# Patient Record
Sex: Female | Born: 1935 | Race: White | Hispanic: No | Marital: Single | Smoking: Never smoker
Health system: Southern US, Community
[De-identification: ages and names within clinical notes are randomized; demographics above are authoritative.]

## PROBLEM LIST (undated history)

## (undated) DIAGNOSIS — Z8489 Family history of other specified conditions: Secondary | ICD-10-CM

## (undated) DIAGNOSIS — E785 Hyperlipidemia, unspecified: Secondary | ICD-10-CM

## (undated) DIAGNOSIS — G459 Transient cerebral ischemic attack, unspecified: Secondary | ICD-10-CM

## (undated) DIAGNOSIS — H9190 Unspecified hearing loss, unspecified ear: Secondary | ICD-10-CM

## (undated) DIAGNOSIS — C4490 Unspecified malignant neoplasm of skin, unspecified: Secondary | ICD-10-CM

## (undated) DIAGNOSIS — I10 Essential (primary) hypertension: Secondary | ICD-10-CM

## (undated) DIAGNOSIS — H903 Sensorineural hearing loss, bilateral: Secondary | ICD-10-CM

## (undated) DIAGNOSIS — R350 Frequency of micturition: Secondary | ICD-10-CM

## (undated) DIAGNOSIS — J45909 Unspecified asthma, uncomplicated: Secondary | ICD-10-CM

## (undated) DIAGNOSIS — I4891 Unspecified atrial fibrillation: Secondary | ICD-10-CM

## (undated) DIAGNOSIS — H01009 Unspecified blepharitis unspecified eye, unspecified eyelid: Secondary | ICD-10-CM

## (undated) DIAGNOSIS — S83209A Unspecified tear of unspecified meniscus, current injury, unspecified knee, initial encounter: Secondary | ICD-10-CM

## (undated) DIAGNOSIS — I499 Cardiac arrhythmia, unspecified: Secondary | ICD-10-CM

## (undated) DIAGNOSIS — E039 Hypothyroidism, unspecified: Secondary | ICD-10-CM

## (undated) DIAGNOSIS — B029 Zoster without complications: Secondary | ICD-10-CM

## (undated) DIAGNOSIS — G5603 Carpal tunnel syndrome, bilateral upper limbs: Principal | ICD-10-CM

## (undated) DIAGNOSIS — E7800 Pure hypercholesterolemia, unspecified: Secondary | ICD-10-CM

## (undated) DIAGNOSIS — M810 Age-related osteoporosis without current pathological fracture: Secondary | ICD-10-CM

## (undated) DIAGNOSIS — H3530 Unspecified macular degeneration: Secondary | ICD-10-CM

## (undated) DIAGNOSIS — R251 Tremor, unspecified: Secondary | ICD-10-CM

## (undated) HISTORY — DX: Zoster without complications: B02.9

## (undated) HISTORY — DX: Unspecified macular degeneration: H35.30

## (undated) HISTORY — DX: Frequency of micturition: R35.0

## (undated) HISTORY — PX: EYE SURGERY: SHX253

## (undated) HISTORY — DX: Unspecified tear of unspecified meniscus, current injury, unspecified knee, initial encounter: S83.209A

## (undated) HISTORY — DX: Hypothyroidism, unspecified: E03.9

## (undated) HISTORY — DX: Sensorineural hearing loss, bilateral: H90.3

## (undated) HISTORY — DX: Unspecified blepharitis unspecified eye, unspecified eyelid: H01.009

## (undated) HISTORY — DX: Tremor, unspecified: R25.1

## (undated) HISTORY — PX: TONSILLECTOMY: SUR1361

## (undated) HISTORY — DX: Unspecified asthma, uncomplicated: J45.909

## (undated) HISTORY — DX: Carpal tunnel syndrome, bilateral upper limbs: G56.03

## (undated) HISTORY — PX: CHOLECYSTECTOMY: SHX55

## (undated) HISTORY — DX: Hyperlipidemia, unspecified: E78.5

## (undated) HISTORY — DX: Age-related osteoporosis without current pathological fracture: M81.0

## (undated) HISTORY — DX: Essential (primary) hypertension: I10

## (undated) HISTORY — DX: Cardiac arrhythmia, unspecified: I49.9

## (undated) HISTORY — DX: Unspecified malignant neoplasm of skin, unspecified: C44.90

## (undated) HISTORY — PX: ABDOMINAL HYSTERECTOMY: SHX81

---
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
## 1972-03-13 HISTORY — PX: CATARACT EXTRACTION: SUR2

## 1975-03-14 HISTORY — PX: HIATAL HERNIA REPAIR: SHX195

## 1975-03-14 HISTORY — PX: DILATION AND CURETTAGE OF UTERUS: SHX78

## 1975-03-14 HISTORY — PX: ABDOMINAL HYSTERECTOMY: SHX81

## 1977-03-13 HISTORY — PX: CATARACT EXTRACTION: SUR2

## 1979-03-14 HISTORY — PX: CHOLECYSTECTOMY: SHX55

## 1979-03-14 HISTORY — PX: GALLBLADDER SURGERY: SHX652

## 1991-03-14 HISTORY — PX: CYSTOCELE REPAIR: SHX163

## 1993-03-13 HISTORY — PX: COSMETIC SURGERY: SHX468

## 2002-03-13 DIAGNOSIS — B029 Zoster without complications: Secondary | ICD-10-CM

## 2002-03-13 HISTORY — DX: Zoster without complications: B02.9

## 2003-03-14 DIAGNOSIS — C4490 Unspecified malignant neoplasm of skin, unspecified: Secondary | ICD-10-CM

## 2003-03-14 HISTORY — PX: SKIN CANCER EXCISION: SHX779

## 2003-03-14 HISTORY — DX: Unspecified malignant neoplasm of skin, unspecified: C44.90

## 2004-03-13 DIAGNOSIS — S83209A Unspecified tear of unspecified meniscus, current injury, unspecified knee, initial encounter: Secondary | ICD-10-CM

## 2004-03-13 DIAGNOSIS — R251 Tremor, unspecified: Secondary | ICD-10-CM

## 2004-03-13 HISTORY — DX: Tremor, unspecified: R25.1

## 2004-03-13 HISTORY — DX: Unspecified tear of unspecified meniscus, current injury, unspecified knee, initial encounter: S83.209A

## 2005-03-13 DIAGNOSIS — H903 Sensorineural hearing loss, bilateral: Secondary | ICD-10-CM

## 2005-03-13 HISTORY — DX: Sensorineural hearing loss, bilateral: H90.3

## 2006-08-16 ENCOUNTER — Encounter: Admission: RE | Admit: 2006-08-16 | Discharge: 2006-08-16 | Payer: Self-pay | Admitting: Orthopedic Surgery

## 2007-01-04 ENCOUNTER — Encounter: Admission: RE | Admit: 2007-01-04 | Discharge: 2007-01-04 | Payer: Self-pay | Admitting: Obstetrics and Gynecology

## 2008-01-08 ENCOUNTER — Encounter: Admission: RE | Admit: 2008-01-08 | Discharge: 2008-01-08 | Payer: Self-pay | Admitting: Obstetrics and Gynecology

## 2009-01-08 ENCOUNTER — Encounter: Admission: RE | Admit: 2009-01-08 | Discharge: 2009-01-08 | Payer: Self-pay | Admitting: Obstetrics and Gynecology

## 2009-03-25 ENCOUNTER — Encounter: Admission: RE | Admit: 2009-03-25 | Discharge: 2009-03-25 | Payer: Self-pay | Admitting: Internal Medicine

## 2010-01-11 ENCOUNTER — Encounter: Admission: RE | Admit: 2010-01-11 | Discharge: 2010-01-11 | Payer: Self-pay | Admitting: Obstetrics and Gynecology

## 2010-03-13 DIAGNOSIS — H3530 Unspecified macular degeneration: Secondary | ICD-10-CM

## 2010-03-13 HISTORY — DX: Unspecified macular degeneration: H35.30

## 2010-05-12 ENCOUNTER — Encounter: Payer: Self-pay | Admitting: Cardiology

## 2010-05-31 ENCOUNTER — Ambulatory Visit (INDEPENDENT_AMBULATORY_CARE_PROVIDER_SITE_OTHER): Payer: Medicare Other | Admitting: Pulmonary Disease

## 2010-05-31 ENCOUNTER — Ambulatory Visit (INDEPENDENT_AMBULATORY_CARE_PROVIDER_SITE_OTHER)
Admission: RE | Admit: 2010-05-31 | Discharge: 2010-05-31 | Disposition: A | Discharge status: Home / Self Care | Payer: Medicare Other | Source: Ambulatory Visit | Attending: Pulmonary Disease | Admitting: Pulmonary Disease

## 2010-05-31 ENCOUNTER — Encounter: Payer: Self-pay | Admitting: Pulmonary Disease

## 2010-05-31 VITALS — BP 138/80 | HR 61 | Temp 97.7°F | Ht 61.0 in | Wt 148.8 lb

## 2010-05-31 DIAGNOSIS — J45909 Unspecified asthma, uncomplicated: Secondary | ICD-10-CM

## 2010-05-31 DIAGNOSIS — R0600 Dyspnea, unspecified: Secondary | ICD-10-CM | POA: Insufficient documentation

## 2010-05-31 DIAGNOSIS — R0609 Other forms of dyspnea: Secondary | ICD-10-CM

## 2010-05-31 NOTE — Assessment & Plan Note (Addendum)
The pt has worsening doe over the last year that may or may not be due to her asthma.  Will need to consider other etiologies such as concomitant cardiac disease depending upon what is found in workup.  Will check cxr today to r/o any obvious acute pathology.

## 2010-05-31 NOTE — Progress Notes (Signed)
Subjective:    Patient ID: Christina Lawson, female    DOB: 08/03/1923, 77 y.o.   MRN: [id number]  HPI The pt is an 77y/o female who I have been asked to see for evaluation of dyspnea.  She has longstanding asthma dating back to the 50's, but has really done well overall.  She was initially treated with allergy vaccine, but this was discontinued and she was started on pulmicort by Dr. Willa Rough.  She did well until Jan of this year, and began to notice increased doe.  I do not have records from Dr. Willa Rough, but apparently there was a decline in her airflows on spirometry, and there was concern this was responsible for her worsening sob.  Her pulmicort was changed to qvar, but she thinks she is doing worse on this.  She primarily has doe with moderate activities, but thinks she can walk at a slow pace for "miles".  She will get winded bringing groceries in from the car.  She states that cough has never been a big issue until she changed to qvar.  She has never smoked, and has not had a recent cxr.  She has never been on ICS/LABA combo.  She rarely uses rescue inhaler.  The pt has no known cardiac history, and has not ever had a cardiac eval to her knowledge.  She denies having any allergy issues.  She does have a h/o thyroidectomy, but tells me her TSH has been controlled.    Past Medical History  Diagnosis Date  . Unspecified essential hypertension   . Hyperlipidemia   . Skin cancer 2005    nose  . Allergic rhinitis   . Irregular heart rhythm   . Shingles 2004  . Delivery normal 1960  . Breech delivery 1962  . Macular degeneration 2012  . Tremor 2006    familiar.  dx by Dr. Hedy Camara  . Torn meniscus 2006    right knee  . Hearing loss 2007    Family History  Problem Relation Age of Onset  . Emphysema Father   . Allergies Father   . Heart disease Maternal Grandfather   . Brain cancer Mother   . Breast cancer Sister     also had bone marrow cancer  . Endometrial cancer Sister     History    Social History  . Marital Status: Single    Spouse Name: N/A    Number of Children: 2  . Years of Education: N/A   Occupational History  . social worker     retired   Social History Main Topics  . Smoking status: Never Smoker   . Smokeless tobacco: Not on file  . Alcohol Use: Yes     on occ.  . Drug Use: Not on file  . Sexually Active: Not on file   Other Topics Concern  . Not on file   Social History Narrative   Pt is widowed.    Allergies  Allergen Reactions  . Ibuprofen   . Penicillins     Outpatient Encounter Prescriptions as of 05/31/2010  Medication Sig Dispense Refill  . Aloe Vera GEL Apply topically as needed.        . ALPRAZolam (XANAX) 0.25 MG tablet Take 0.25 mg by mouth 2 (two) times daily as needed.        Marland Kitchen aspirin 81 MG tablet Take 81 mg by mouth daily.        Marland Kitchen atenolol (TENORMIN) 25 MG tablet Take 25 mg by mouth  2 (two) times daily.        . beclomethasone (QVAR) 80 MCG/ACT inhaler Inhale 2 puffs into the lungs 2 (two) times daily.        . Bepotastine Besilate (BEPREVE) 1.5 % SOLN Place 1 drop into both eyes 2 (two) times daily.        . calcium carbonate (OS-CAL) 600 MG TABS Take 600 mg by mouth 2 (two) times daily with a meal.        . Calcium Carbonate-Vit D-Min 600-400 MG-UNIT TABS Take 1 tablet by mouth 2 (two) times daily.        Marland Kitchen docusate sodium (COLACE) 100 MG capsule Take 200 mg by mouth daily as needed.        . fish oil-omega-3 fatty acids 1000 MG capsule Take 1 capsule by mouth daily.        Marland Kitchen loratadine (CLARITIN) 10 MG tablet Take 1 tab by mouth every other day       . minoxidil (ROGAINE) 2 % external solution Apply topically 2 (two) times daily.        . mometasone (NASONEX) 50 MCG/ACT nasal spray 1 spray by Nasal route daily.        . Multiple Vitamins-Minerals (CENTRUM PO) Take 1 tablet by mouth daily.        . Multiple Vitamins-Minerals (OCUVITE PO) Take 1 tablet by mouth daily.        . naproxen sodium (ANAPROX) 220 MG tablet Take  220 mg by mouth every 12 (twelve) hours as needed.        Marland Kitchen omeprazole (PRILOSEC) 20 MG capsule Take 20 mg by mouth daily.        . Saline (SIMPLY SALINE) 0.9 % AERS 1 spray by Nasal route 2 (two) times daily.        . simvastatin (ZOCOR) 20 MG tablet Take 20 mg by mouth at bedtime.        . traMADol (ULTRAM) 50 MG tablet Take 50 mg by mouth 3 (three) times daily as needed.             Review of Systems  Constitutional: Negative for fever and appetite change.  HENT: Negative for ear pain, congestion, sore throat, sneezing and trouble swallowing.   Respiratory: Positive for cough and shortness of breath.   Cardiovascular: Negative for chest pain.  Gastrointestinal: Negative for abdominal pain.  Psychiatric/Behavioral: The patient is nervous/anxious.        Objective:   Physical Exam  Constitutional: She is oriented to person, place, and time. She appears well-developed and well-nourished.  Eyes: EOM are normal. Pupils are equal, round, and reactive to light.  Neck: No JVD present. No thyromegaly present.  Cardiovascular: Normal rate, regular rhythm and intact distal pulses.  Exam reveals no friction rub.   Murmur (1/6 sem) heard. Pulmonary/Chest: Effort normal and breath sounds normal. No respiratory distress. She has no wheezes. She has no rales.  Abdominal: Soft. Bowel sounds are normal. There is no tenderness.  Musculoskeletal: She exhibits no edema.       No cyanosis   Lymphadenopathy:    She has no cervical adenopathy.  Neurological: She is alert and oriented to person, place, and time.       Moves all 4              Assessment & Plan:  Asthma, intrinsic The pt has a long history of asthma, and now is having worsening doe over the last year.  She  apparently has worsening flows on spirometry, but I will need to get these for review.  She may simply have fixed asthma that is acting more like copd due to longstanding airway remodeling.  We see this a lot in the elderly  asthma population who grew up in an age without ICS.  Would like to do full pfts for evaluation.  She is to stay on current therapy for now.  Dyspnea The pt has worsening doe over the last year that may or may not be due to her asthma.  Will need to consider other etiologies such as concomitant cardiac disease depending upon what is found in workup.  Will check cxr today to r/o any obvious acute pathology.

## 2010-05-31 NOTE — Patient Instructions (Signed)
Stay on qvar for now with rescue inhaler Will schedule for full breathing tests with followup visit same day Will check cxr today and call with results.

## 2010-05-31 NOTE — Assessment & Plan Note (Addendum)
The pt has a long history of asthma, and now is having worsening doe over the last year.  She apparently has worsening flows on spirometry, but I will need to get these for review.  She may simply have fixed asthma that is acting more like copd due to longstanding airway remodeling.  We see this a lot in the elderly asthma population who grew up in an age without ICS.  Would like to do full pfts for evaluation.  She is to stay on current therapy for now.

## 2010-06-02 ENCOUNTER — Telehealth: Payer: Self-pay | Admitting: Pulmonary Disease

## 2010-06-02 NOTE — Telephone Encounter (Signed)
Please advise of CXR results from 05-31-10. Thanks.

## 2010-06-02 NOTE — Telephone Encounter (Signed)
Let pt know that her cxr shows heart mildly enlarged, but no fluid on or in lungs Nothing on cxr to explain her worsening sob, though we may at some point have to consider cardiac evaluation.  Will see back when she has breathing tests.

## 2010-06-02 NOTE — Telephone Encounter (Signed)
LMOMTCB x 1 

## 2010-06-02 NOTE — Telephone Encounter (Signed)
Called and spoke with pt. Pt aware of cxr results.  Pt verbalized her understanding and denied any questions. Pt has pending appt for pft on 06/24/2010 and appt with kc following the pft.

## 2010-06-24 ENCOUNTER — Encounter: Payer: Self-pay | Admitting: Pulmonary Disease

## 2010-06-24 ENCOUNTER — Ambulatory Visit (INDEPENDENT_AMBULATORY_CARE_PROVIDER_SITE_OTHER): Payer: Medicare Other | Admitting: Pulmonary Disease

## 2010-06-24 DIAGNOSIS — R0989 Other specified symptoms and signs involving the circulatory and respiratory systems: Secondary | ICD-10-CM

## 2010-06-24 DIAGNOSIS — R0609 Other forms of dyspnea: Secondary | ICD-10-CM

## 2010-06-24 DIAGNOSIS — J45909 Unspecified asthma, uncomplicated: Secondary | ICD-10-CM

## 2010-06-24 DIAGNOSIS — R0600 Dyspnea, unspecified: Secondary | ICD-10-CM

## 2010-06-24 LAB — PULMONARY FUNCTION TEST

## 2010-06-24 NOTE — Assessment & Plan Note (Signed)
The pt's pfts are really unimpressive, and I suspect that her asthma is not the reason for her doe.  Will try more aggressive treatment of her asthma, but if no change, she will need cardiac evaluation.

## 2010-06-24 NOTE — Assessment & Plan Note (Signed)
The pt's asthma appears well controlled by symptoms, and her spirometry is totally normal.  She does have mild airtrapping on lung volumes though.  I am willing to give her a trial of more aggressive treatment for her asthma with LABA/ICS, but I really do not think her asthma is responsible for her dyspnea.

## 2010-06-24 NOTE — Progress Notes (Signed)
  Subjective:    Patient ID: Christina Lawson, female    DOB: September 09, 1923, 77 y.o.   MRN: [id number]  HPI The pt comes in today for review of her pfts.  They show no airflow obstruction, no restriction, and minimal decrease in DLCO that corrects with Av.  She did have mild airtrapping with increased RV.  I have reviewed the study with her in detail, and answered all of her questions.  Of note, her cxr last visit showed CM, but clear lung fields.    Review of Systems  Constitutional: Negative for fever and unexpected weight change.  HENT: Positive for congestion, sneezing and dental problem. Negative for ear pain, nosebleeds, sore throat, rhinorrhea, trouble swallowing, postnasal drip and sinus pressure.   Eyes: Positive for redness and itching.  Respiratory: Positive for cough, chest tightness and shortness of breath. Negative for wheezing.   Cardiovascular: Negative for palpitations and leg swelling.  Gastrointestinal: Negative for nausea and vomiting.  Genitourinary: Negative for dysuria.  Musculoskeletal: Negative for joint swelling.  Skin: Negative for rash.  Neurological: Negative for headaches.  Hematological: Bruises/bleeds easily.  Psychiatric/Behavioral: Negative for dysphoric mood. The patient is not nervous/anxious.        Objective:   Physical Exam Ow female in nad Chest clear to auscultation Cor with rrr LE with minimal edema, no cyanosis  Alert and oriented, moves all 4        Assessment & Plan:

## 2010-06-24 NOTE — Patient Instructions (Signed)
Stop qvar for now.  Trial of dulera 100/5  2 inhalations am and pm.  Rinse mouth well.  Please call me in 3 weeks to let me know if breathing has significantly improved.  If it has not, would go back to either pulmicort or qvar for maintenance, and would recommend cardiac workup.

## 2010-06-24 NOTE — Progress Notes (Signed)
PFT done today. 

## 2010-06-29 ENCOUNTER — Encounter: Payer: Self-pay | Admitting: Pulmonary Disease

## 2010-07-18 ENCOUNTER — Telehealth: Payer: Self-pay | Admitting: Pulmonary Disease

## 2010-07-18 ENCOUNTER — Other Ambulatory Visit: Payer: Self-pay | Admitting: Pulmonary Disease

## 2010-07-18 DIAGNOSIS — R0600 Dyspnea, unspecified: Secondary | ICD-10-CM

## 2010-07-18 NOTE — Telephone Encounter (Signed)
Ok to stop dulera, and to get back on qvar Ok to send copy of pfts to her md's I will send an order to [facility] for cardiology apptm

## 2010-07-18 NOTE — Telephone Encounter (Signed)
Pt says her breathing has not improved since on Dulera and says the cough is actually worse. She would also like to know if [facility] would make referral for cardiology. Pls send copies of PFT to Dr. Murray Hodgkins and Dr. Reather Converse. Pls advise.

## 2010-07-19 NOTE — Telephone Encounter (Signed)
Pt aware to stop dulera and go back to qvar. Pt aware order was sent to [facility] for cardiology appt. Pt aware pft's will be faxed to Dr. Willa Rough and Dr. Chilton Si

## 2010-07-28 ENCOUNTER — Encounter: Payer: Self-pay | Admitting: Pulmonary Disease

## 2010-08-10 ENCOUNTER — Encounter: Payer: Self-pay | Admitting: Cardiology

## 2010-08-10 ENCOUNTER — Ambulatory Visit (INDEPENDENT_AMBULATORY_CARE_PROVIDER_SITE_OTHER): Payer: Medicare Other | Admitting: Cardiology

## 2010-08-10 VITALS — BP 148/82 | HR 61 | Ht 60.0 in | Wt 145.0 lb

## 2010-08-10 DIAGNOSIS — I119 Hypertensive heart disease without heart failure: Secondary | ICD-10-CM | POA: Insufficient documentation

## 2010-08-10 DIAGNOSIS — I517 Cardiomegaly: Secondary | ICD-10-CM

## 2010-08-10 NOTE — Patient Instructions (Signed)
1. Your physician recommends that you schedule a follow-up appointment as needed with Dr. Daleen Squibb 2. Your physician has requested that you have an echocardiogram. Echocardiography is a painless test that uses sound waves to create images of your heart. It provides your doctor with information about the size and shape of your heart and how well your heart's chambers and valves are working. This procedure takes approximately one hour. There are no restrictions for this procedure.   Appointment is: June 7,2012 AT 11:30 am

## 2010-08-10 NOTE — Progress Notes (Signed)
Christina Lawson is referred today by Dr. Shelle Iron for cardiomegaly on chest x-ray.  She has had hypertension for a number of years. She has no previous valvular heart disease or coronary disease.  Just mild dyspnea on exertion is her only symptom. She is not short of breath with routine activity. She denies any orthopnea, PND, palpitations, edema.  Past Medical History  Diagnosis Date  . Unspecified essential hypertension   . Hyperlipidemia   . Skin cancer 2005    nose  . Allergic rhinitis   . Irregular heart rhythm   . Shingles 2004  . Delivery normal 1960  . Breech delivery 1962  . Macular degeneration 2012  . Tremor 2006    familiar.  dx by Dr. Hedy Camara  . Torn meniscus 2006    right knee  . Hearing loss 2007    Past Surgical History  Procedure Date  . Tonsillectomy 1931  . Thyroidectomy 1955  . Cataract extraction 1974    Left  . Abdominal hysterectomy 1977  . Dilation and curettage of uterus 1977  . Hiatal hernia repair 1977  . Cataract extraction 1979    right  . Gallbladder surgery 1981  . Cosmetic surgery 1995    eye  . Skin cancer excision 2005    nose    Family History  Problem Relation Age of Onset  . Emphysema Father   . Allergies Father   . Heart disease Maternal Grandfather   . Brain cancer Mother   . Breast cancer Sister     also had bone marrow cancer  . Endometrial cancer Sister   . Stroke      History   Social History  . Marital Status: Single    Spouse Name: N/A    Number of Children: 2  . Years of Education: N/A   Occupational History  . social worker     retired   Social History Main Topics  . Smoking status: Never Smoker   . Smokeless tobacco: Not on file  . Alcohol Use: Yes     on occ.  . Drug Use: No  . Sexually Active: Not on file   Other Topics Concern  . Not on file   Social History Narrative   Pt is widowed.    Allergies  Allergen Reactions  . Ibuprofen   . Penicillins     . Current Outpatient  Prescriptions  Medication Sig Dispense Refill  . Aloe Vera GEL Apply topically as needed.        . ALPRAZolam (XANAX) 0.25 MG tablet Take 0.25 mg by mouth at bedtime as needed.       Marland Kitchen aspirin 81 MG tablet Take 81 mg by mouth daily.        Marland Kitchen atenolol (TENORMIN) 25 MG tablet Take 25 mg by mouth 2 (two) times daily.        . beclomethasone (QVAR) 80 MCG/ACT inhaler Inhale 2 puffs into the lungs 2 (two) times daily.        . Bepotastine Besilate (BEPREVE) 1.5 % SOLN Place 1 drop into both eyes 2 (two) times daily.        . Biotin 1000 MCG tablet Take 1,000 mcg by mouth daily.        . Calcium Carbonate-Vit D-Min 600-400 MG-UNIT TABS Take 1 tablet by mouth 2 (two) times daily.        Marland Kitchen docusate sodium (COLACE) 100 MG capsule Take 200 mg by mouth daily as needed.        Marland Kitchen  fish oil-omega-3 fatty acids 1000 MG capsule Take 1 capsule by mouth daily.        Marland Kitchen loratadine (CLARITIN) 10 MG tablet Take 10 mg by mouth daily.       . minoxidil (ROGAINE) 2 % external solution Apply topically 2 (two) times daily.        . mometasone (NASONEX) 50 MCG/ACT nasal spray 1 spray by Nasal route daily.        . Multiple Vitamins-Minerals (CENTRUM PO) Take 1 tablet by mouth daily.        . Multiple Vitamins-Minerals (OCUVITE PO) Take 1 tablet by mouth daily.        . naproxen sodium (ANAPROX) 220 MG tablet Take 220 mg by mouth every 12 (twelve) hours as needed.        Marland Kitchen omeprazole (PRILOSEC) 20 MG capsule Take 20 mg by mouth daily.        . Saline (SIMPLY SALINE) 0.9 % AERS 1 spray by Nasal route 2 (two) times daily.        . simvastatin (ZOCOR) 20 MG tablet Take 20 mg by mouth at bedtime.        . traMADol (ULTRAM) 50 MG tablet Take 50 mg by mouth 3 (three) times daily as needed.        Marland Kitchen DISCONTD: calcium carbonate (OS-CAL) 600 MG TABS Take 600 mg by mouth 2 (two) times daily with a meal.          ROS Negative other than HPI.  General Appearance: well developed, well nourished in no acute distress HEENT:  symmetrical face, PERRLA, good dentition  Neck: no JVD, thyromegaly, or adenopathy, trachea midline Chest: symmetric without deformity Cardiac: PMI non-displaced, RRR, normal S1, S2, soft systolic murmur LSB , no diastolic murmur Lung: clear to ausculation and percussion Vascular: all pulses full without bruits  Abdominal: nondistended, nontender, good bowel sounds, no HSM, no bruits Extremities: no cyanosis, clubbing or edema, no sign of DVT, no varicosities  Skin: normal color, no rashes Neuro: alert and oriented x 3, non-focal Pysch: normal affect  Filed Vitals:   08/10/10 0911  BP: 148/82  Pulse: 61  Height: 5' (1.524 m)  Weight: 145 lb (65.772 kg)    EKG  Labs and Studies Reviewed.   No results found for this basename: WBC, HGB, HCT, MCV, PLT      Chemistry   No results found for this basename: NA, K, CL, CO2, BUN, CREATININE, GLU   No results found for this basename: CALCIUM, ALKPHOS, AST, ALT, BILITOT       No results found for this basename: CHOL   No results found for this basename: HDL   No results found for this basename: LDLCALC   No results found for this basename: TRIG   No results found for this basename: CHOLHDL   No results found for this basename: HGBA1C   No results found for this basename: ALT, AST, GGT, ALKPHOS, BILITOT   No results found for this basename: TSH

## 2010-08-18 ENCOUNTER — Ambulatory Visit (HOSPITAL_COMMUNITY): Payer: Medicare Other | Attending: Cardiovascular Disease

## 2010-08-18 DIAGNOSIS — R011 Cardiac murmur, unspecified: Secondary | ICD-10-CM

## 2010-08-18 DIAGNOSIS — I059 Rheumatic mitral valve disease, unspecified: Secondary | ICD-10-CM | POA: Insufficient documentation

## 2010-08-18 DIAGNOSIS — I079 Rheumatic tricuspid valve disease, unspecified: Secondary | ICD-10-CM | POA: Insufficient documentation

## 2010-08-18 DIAGNOSIS — E785 Hyperlipidemia, unspecified: Secondary | ICD-10-CM | POA: Insufficient documentation

## 2010-08-18 DIAGNOSIS — I379 Nonrheumatic pulmonary valve disorder, unspecified: Secondary | ICD-10-CM | POA: Insufficient documentation

## 2010-08-18 DIAGNOSIS — I517 Cardiomegaly: Secondary | ICD-10-CM

## 2010-08-18 DIAGNOSIS — I10 Essential (primary) hypertension: Secondary | ICD-10-CM | POA: Insufficient documentation

## 2010-08-22 ENCOUNTER — Telehealth: Payer: Self-pay | Admitting: Cardiology

## 2010-08-22 NOTE — Telephone Encounter (Signed)
Returning call from friday

## 2010-08-23 NOTE — Telephone Encounter (Signed)
Pt rtn call from debbie from Friday, pt called back yesterday and the message was left for debbie but she's not here this week-pt anxious about call

## 2010-08-23 NOTE — Telephone Encounter (Signed)
Echo results and recommendations given to pt. A copy of echo mailed to pt. as requested.

## 2010-09-06 ENCOUNTER — Telehealth: Payer: Self-pay | Admitting: Cardiology

## 2010-09-06 NOTE — Telephone Encounter (Signed)
Faxed Echo. To [place] @ [facility] ([phone]).

## 2010-09-08 ENCOUNTER — Telehealth: Payer: Self-pay | Admitting: Cardiology

## 2010-09-08 NOTE — Telephone Encounter (Signed)
ROI Faxed to [facility] @ [phone] 09/08/10/km

## 2010-09-23 ENCOUNTER — Telehealth: Payer: Self-pay | Admitting: *Deleted

## 2010-09-23 NOTE — Telephone Encounter (Signed)
Message copied by Theda Belfast on Fri Sep 23, 2010  9:09 AM ------      Message from: Valera [place] C      Created: Thu Sep 22, 2010  5:30 PM       Normal echocardiogram and had no evidence of an enlarged heart.

## 2010-09-23 NOTE — Telephone Encounter (Signed)
Pt aware of echo results Debbie Tyon Cerasoli RN  

## 2010-12-05 ENCOUNTER — Other Ambulatory Visit: Payer: Self-pay | Admitting: Internal Medicine

## 2010-12-05 DIAGNOSIS — Z1231 Encounter for screening mammogram for malignant neoplasm of breast: Secondary | ICD-10-CM

## 2010-12-14 ENCOUNTER — Other Ambulatory Visit: Payer: Self-pay | Admitting: Internal Medicine

## 2010-12-14 DIAGNOSIS — M8580 Other specified disorders of bone density and structure, unspecified site: Secondary | ICD-10-CM

## 2011-01-18 ENCOUNTER — Ambulatory Visit
Admission: RE | Admit: 2011-01-18 | Discharge: 2011-01-18 | Disposition: A | Discharge status: Home / Self Care | Payer: Medicare Other | Source: Ambulatory Visit | Attending: Internal Medicine | Admitting: Internal Medicine

## 2011-01-18 DIAGNOSIS — Z1231 Encounter for screening mammogram for malignant neoplasm of breast: Secondary | ICD-10-CM

## 2011-01-18 DIAGNOSIS — M8580 Other specified disorders of bone density and structure, unspecified site: Secondary | ICD-10-CM

## 2011-03-23 DIAGNOSIS — J45909 Unspecified asthma, uncomplicated: Secondary | ICD-10-CM | POA: Diagnosis not present

## 2011-03-23 DIAGNOSIS — J309 Allergic rhinitis, unspecified: Secondary | ICD-10-CM | POA: Diagnosis not present

## 2011-04-12 DIAGNOSIS — M25519 Pain in unspecified shoulder: Secondary | ICD-10-CM | POA: Diagnosis not present

## 2011-04-12 DIAGNOSIS — IMO0001 Reserved for inherently not codable concepts without codable children: Secondary | ICD-10-CM | POA: Diagnosis not present

## 2011-04-20 DIAGNOSIS — J45909 Unspecified asthma, uncomplicated: Secondary | ICD-10-CM | POA: Diagnosis not present

## 2011-04-20 DIAGNOSIS — J309 Allergic rhinitis, unspecified: Secondary | ICD-10-CM | POA: Diagnosis not present

## 2011-04-27 DIAGNOSIS — H3530 Unspecified macular degeneration: Secondary | ICD-10-CM | POA: Diagnosis not present

## 2011-04-27 DIAGNOSIS — Z961 Presence of intraocular lens: Secondary | ICD-10-CM | POA: Diagnosis not present

## 2011-04-27 DIAGNOSIS — H1045 Other chronic allergic conjunctivitis: Secondary | ICD-10-CM | POA: Diagnosis not present

## 2011-05-11 DIAGNOSIS — G252 Other specified forms of tremor: Secondary | ICD-10-CM | POA: Diagnosis not present

## 2011-05-11 DIAGNOSIS — M810 Age-related osteoporosis without current pathological fracture: Secondary | ICD-10-CM | POA: Diagnosis not present

## 2011-05-11 DIAGNOSIS — R609 Edema, unspecified: Secondary | ICD-10-CM | POA: Diagnosis not present

## 2011-05-11 DIAGNOSIS — E039 Hypothyroidism, unspecified: Secondary | ICD-10-CM | POA: Diagnosis not present

## 2011-05-11 DIAGNOSIS — R7989 Other specified abnormal findings of blood chemistry: Secondary | ICD-10-CM | POA: Diagnosis not present

## 2011-05-22 DIAGNOSIS — G250 Essential tremor: Secondary | ICD-10-CM | POA: Diagnosis not present

## 2011-05-22 DIAGNOSIS — J45909 Unspecified asthma, uncomplicated: Secondary | ICD-10-CM | POA: Diagnosis not present

## 2011-05-22 DIAGNOSIS — R609 Edema, unspecified: Secondary | ICD-10-CM | POA: Diagnosis not present

## 2011-05-22 DIAGNOSIS — E039 Hypothyroidism, unspecified: Secondary | ICD-10-CM | POA: Diagnosis not present

## 2011-05-22 DIAGNOSIS — R7989 Other specified abnormal findings of blood chemistry: Secondary | ICD-10-CM | POA: Diagnosis not present

## 2011-05-22 DIAGNOSIS — E785 Hyperlipidemia, unspecified: Secondary | ICD-10-CM | POA: Diagnosis not present

## 2011-05-25 DIAGNOSIS — H6120 Impacted cerumen, unspecified ear: Secondary | ICD-10-CM | POA: Diagnosis not present

## 2011-05-25 DIAGNOSIS — H903 Sensorineural hearing loss, bilateral: Secondary | ICD-10-CM | POA: Diagnosis not present

## 2011-05-25 DIAGNOSIS — H6980 Other specified disorders of Eustachian tube, unspecified ear: Secondary | ICD-10-CM | POA: Diagnosis not present

## 2011-05-25 DIAGNOSIS — H908 Mixed conductive and sensorineural hearing loss, unspecified: Secondary | ICD-10-CM | POA: Diagnosis not present

## 2011-05-31 DIAGNOSIS — K648 Other hemorrhoids: Secondary | ICD-10-CM | POA: Diagnosis not present

## 2011-06-19 DIAGNOSIS — H903 Sensorineural hearing loss, bilateral: Secondary | ICD-10-CM | POA: Diagnosis not present

## 2011-06-19 DIAGNOSIS — H908 Mixed conductive and sensorineural hearing loss, unspecified: Secondary | ICD-10-CM | POA: Diagnosis not present

## 2011-06-19 DIAGNOSIS — H6980 Other specified disorders of Eustachian tube, unspecified ear: Secondary | ICD-10-CM | POA: Diagnosis not present

## 2011-06-27 DIAGNOSIS — H903 Sensorineural hearing loss, bilateral: Secondary | ICD-10-CM | POA: Diagnosis not present

## 2011-06-27 DIAGNOSIS — H908 Mixed conductive and sensorineural hearing loss, unspecified: Secondary | ICD-10-CM | POA: Diagnosis not present

## 2011-06-27 DIAGNOSIS — H6980 Other specified disorders of Eustachian tube, unspecified ear: Secondary | ICD-10-CM | POA: Diagnosis not present

## 2011-07-04 DIAGNOSIS — L408 Other psoriasis: Secondary | ICD-10-CM | POA: Diagnosis not present

## 2011-07-04 DIAGNOSIS — D239 Other benign neoplasm of skin, unspecified: Secondary | ICD-10-CM | POA: Diagnosis not present

## 2011-07-04 DIAGNOSIS — L819 Disorder of pigmentation, unspecified: Secondary | ICD-10-CM | POA: Diagnosis not present

## 2011-07-04 DIAGNOSIS — L821 Other seborrheic keratosis: Secondary | ICD-10-CM | POA: Diagnosis not present

## 2011-07-04 DIAGNOSIS — Z85828 Personal history of other malignant neoplasm of skin: Secondary | ICD-10-CM | POA: Diagnosis not present

## 2011-07-04 DIAGNOSIS — L659 Nonscarring hair loss, unspecified: Secondary | ICD-10-CM | POA: Diagnosis not present

## 2011-07-04 DIAGNOSIS — D1801 Hemangioma of skin and subcutaneous tissue: Secondary | ICD-10-CM | POA: Diagnosis not present

## 2011-10-03 DIAGNOSIS — L820 Inflamed seborrheic keratosis: Secondary | ICD-10-CM | POA: Diagnosis not present

## 2011-10-03 DIAGNOSIS — L821 Other seborrheic keratosis: Secondary | ICD-10-CM | POA: Diagnosis not present

## 2011-10-25 DIAGNOSIS — J45909 Unspecified asthma, uncomplicated: Secondary | ICD-10-CM | POA: Diagnosis not present

## 2011-10-25 DIAGNOSIS — J309 Allergic rhinitis, unspecified: Secondary | ICD-10-CM | POA: Diagnosis not present

## 2011-11-09 DIAGNOSIS — Z961 Presence of intraocular lens: Secondary | ICD-10-CM | POA: Diagnosis not present

## 2011-11-09 DIAGNOSIS — H3530 Unspecified macular degeneration: Secondary | ICD-10-CM | POA: Diagnosis not present

## 2011-11-15 ENCOUNTER — Other Ambulatory Visit: Payer: Self-pay | Admitting: Geriatric Medicine

## 2011-11-15 ENCOUNTER — Ambulatory Visit
Admission: RE | Admit: 2011-11-15 | Discharge: 2011-11-15 | Disposition: A | Discharge status: Home / Self Care | Payer: Medicare Other | Source: Ambulatory Visit | Attending: Geriatric Medicine | Admitting: Geriatric Medicine

## 2011-11-15 DIAGNOSIS — M79609 Pain in unspecified limb: Secondary | ICD-10-CM | POA: Diagnosis not present

## 2011-11-15 DIAGNOSIS — M79673 Pain in unspecified foot: Secondary | ICD-10-CM

## 2011-11-15 DIAGNOSIS — M25579 Pain in unspecified ankle and joints of unspecified foot: Secondary | ICD-10-CM | POA: Diagnosis not present

## 2011-12-05 DIAGNOSIS — E785 Hyperlipidemia, unspecified: Secondary | ICD-10-CM | POA: Diagnosis not present

## 2011-12-05 DIAGNOSIS — E039 Hypothyroidism, unspecified: Secondary | ICD-10-CM | POA: Diagnosis not present

## 2011-12-05 DIAGNOSIS — Z79899 Other long term (current) drug therapy: Secondary | ICD-10-CM | POA: Diagnosis not present

## 2011-12-05 DIAGNOSIS — R7989 Other specified abnormal findings of blood chemistry: Secondary | ICD-10-CM | POA: Diagnosis not present

## 2011-12-18 ENCOUNTER — Other Ambulatory Visit: Payer: Self-pay | Admitting: Internal Medicine

## 2011-12-18 DIAGNOSIS — J45909 Unspecified asthma, uncomplicated: Secondary | ICD-10-CM | POA: Diagnosis not present

## 2011-12-18 DIAGNOSIS — M47817 Spondylosis without myelopathy or radiculopathy, lumbosacral region: Secondary | ICD-10-CM | POA: Diagnosis not present

## 2011-12-18 DIAGNOSIS — E785 Hyperlipidemia, unspecified: Secondary | ICD-10-CM | POA: Diagnosis not present

## 2011-12-18 DIAGNOSIS — Z1231 Encounter for screening mammogram for malignant neoplasm of breast: Secondary | ICD-10-CM

## 2011-12-18 DIAGNOSIS — G250 Essential tremor: Secondary | ICD-10-CM | POA: Diagnosis not present

## 2011-12-26 DIAGNOSIS — Z23 Encounter for immunization: Secondary | ICD-10-CM | POA: Diagnosis not present

## 2012-01-09 DIAGNOSIS — H903 Sensorineural hearing loss, bilateral: Secondary | ICD-10-CM | POA: Diagnosis not present

## 2012-01-09 DIAGNOSIS — H6120 Impacted cerumen, unspecified ear: Secondary | ICD-10-CM | POA: Diagnosis not present

## 2012-01-23 ENCOUNTER — Ambulatory Visit
Admission: RE | Admit: 2012-01-23 | Discharge: 2012-01-23 | Disposition: A | Discharge status: Home / Self Care | Payer: Medicare Other | Source: Ambulatory Visit | Attending: Internal Medicine | Admitting: Internal Medicine

## 2012-01-23 DIAGNOSIS — Z1231 Encounter for screening mammogram for malignant neoplasm of breast: Secondary | ICD-10-CM

## 2012-02-23 DIAGNOSIS — G250 Essential tremor: Secondary | ICD-10-CM | POA: Diagnosis not present

## 2012-02-23 DIAGNOSIS — G252 Other specified forms of tremor: Secondary | ICD-10-CM | POA: Diagnosis not present

## 2012-04-30 DIAGNOSIS — Z961 Presence of intraocular lens: Secondary | ICD-10-CM | POA: Diagnosis not present

## 2012-04-30 DIAGNOSIS — H3530 Unspecified macular degeneration: Secondary | ICD-10-CM | POA: Diagnosis not present

## 2012-04-30 DIAGNOSIS — H1045 Other chronic allergic conjunctivitis: Secondary | ICD-10-CM | POA: Diagnosis not present

## 2012-04-30 DIAGNOSIS — H04129 Dry eye syndrome of unspecified lacrimal gland: Secondary | ICD-10-CM | POA: Diagnosis not present

## 2012-05-01 DIAGNOSIS — J309 Allergic rhinitis, unspecified: Secondary | ICD-10-CM | POA: Diagnosis not present

## 2012-05-01 DIAGNOSIS — J45909 Unspecified asthma, uncomplicated: Secondary | ICD-10-CM | POA: Diagnosis not present

## 2012-05-23 DIAGNOSIS — H353190 Nonexudative age-related macular degeneration, unspecified eye, stage unspecified: Secondary | ICD-10-CM | POA: Insufficient documentation

## 2012-05-23 DIAGNOSIS — Z961 Presence of intraocular lens: Secondary | ICD-10-CM | POA: Diagnosis not present

## 2012-06-13 DIAGNOSIS — Z79899 Other long term (current) drug therapy: Secondary | ICD-10-CM | POA: Diagnosis not present

## 2012-06-13 DIAGNOSIS — E785 Hyperlipidemia, unspecified: Secondary | ICD-10-CM | POA: Diagnosis not present

## 2012-06-13 DIAGNOSIS — E039 Hypothyroidism, unspecified: Secondary | ICD-10-CM | POA: Diagnosis not present

## 2012-06-13 DIAGNOSIS — R7989 Other specified abnormal findings of blood chemistry: Secondary | ICD-10-CM | POA: Diagnosis not present

## 2012-06-17 ENCOUNTER — Encounter: Payer: Self-pay | Admitting: Internal Medicine

## 2012-06-17 ENCOUNTER — Non-Acute Institutional Stay: Payer: Medicare Other | Admitting: Internal Medicine

## 2012-06-17 DIAGNOSIS — I10 Essential (primary) hypertension: Secondary | ICD-10-CM | POA: Diagnosis not present

## 2012-06-17 DIAGNOSIS — E119 Type 2 diabetes mellitus without complications: Secondary | ICD-10-CM

## 2012-06-17 DIAGNOSIS — R251 Tremor, unspecified: Secondary | ICD-10-CM

## 2012-06-17 DIAGNOSIS — R259 Unspecified abnormal involuntary movements: Secondary | ICD-10-CM | POA: Diagnosis not present

## 2012-06-17 DIAGNOSIS — M25579 Pain in unspecified ankle and joints of unspecified foot: Secondary | ICD-10-CM

## 2012-06-17 DIAGNOSIS — R350 Frequency of micturition: Secondary | ICD-10-CM

## 2012-06-17 DIAGNOSIS — E785 Hyperlipidemia, unspecified: Secondary | ICD-10-CM | POA: Diagnosis not present

## 2012-06-17 DIAGNOSIS — M25572 Pain in left ankle and joints of left foot: Secondary | ICD-10-CM

## 2012-06-17 DIAGNOSIS — E039 Hypothyroidism, unspecified: Secondary | ICD-10-CM

## 2012-06-17 DIAGNOSIS — G250 Essential tremor: Secondary | ICD-10-CM | POA: Insufficient documentation

## 2012-06-17 DIAGNOSIS — H6120 Impacted cerumen, unspecified ear: Secondary | ICD-10-CM

## 2012-07-04 DIAGNOSIS — Z85828 Personal history of other malignant neoplasm of skin: Secondary | ICD-10-CM | POA: Diagnosis not present

## 2012-07-04 DIAGNOSIS — L538 Other specified erythematous conditions: Secondary | ICD-10-CM | POA: Diagnosis not present

## 2012-07-04 DIAGNOSIS — L408 Other psoriasis: Secondary | ICD-10-CM | POA: Diagnosis not present

## 2012-07-04 DIAGNOSIS — D692 Other nonthrombocytopenic purpura: Secondary | ICD-10-CM | POA: Diagnosis not present

## 2012-07-04 DIAGNOSIS — L820 Inflamed seborrheic keratosis: Secondary | ICD-10-CM | POA: Diagnosis not present

## 2012-07-08 DIAGNOSIS — H6120 Impacted cerumen, unspecified ear: Secondary | ICD-10-CM | POA: Diagnosis not present

## 2012-07-08 DIAGNOSIS — H903 Sensorineural hearing loss, bilateral: Secondary | ICD-10-CM | POA: Diagnosis not present

## 2012-07-25 ENCOUNTER — Encounter: Payer: Self-pay | Admitting: Internal Medicine

## 2012-09-23 DIAGNOSIS — H6120 Impacted cerumen, unspecified ear: Secondary | ICD-10-CM | POA: Insufficient documentation

## 2012-09-23 DIAGNOSIS — E119 Type 2 diabetes mellitus without complications: Secondary | ICD-10-CM | POA: Insufficient documentation

## 2012-09-23 DIAGNOSIS — R350 Frequency of micturition: Secondary | ICD-10-CM | POA: Insufficient documentation

## 2012-09-23 MED ORDER — TRAMADOL HCL 50 MG PO TABS: 50.0000 mg | ORAL_TABLET | Freq: Three times a day (TID) | ORAL | Status: DC | PRN

## 2012-09-23 MED ORDER — OXYBUTYNIN 3.9 MG/24HR TD PTTW: 1.0000 | MEDICATED_PATCH | TRANSDERMAL | Status: DC

## 2012-09-23 NOTE — Patient Instructions (Signed)
Continue medications as prescribed. Add oxybutynin for urinary frequency.

## 2012-09-23 NOTE — Progress Notes (Signed)
Subjective:    Patient ID: Christina Lawson, female    DOB: February 13, 1924, 79 y.o.   MRN: [id number]  HPI  6 month follow up.  Heel pains are better Exercising: goes to pool 3 days weekly Naps more during the day. SOB with exertion, like walking up a hill.  Unspecified essential hypertension: controlled  Hyperlipidemia: recheck next visit  Tremor; unchanged  Urinary frequency: would like to try oxybutynin patch  Impacted cerumen, unspecified laterality: no pain  Unspecified hypothyroidism: controlled on supplements  Type II or unspecified type diabetes mellitus without mention of complication, not stated as uncontrolled: controlled  Current Outpatient Prescriptions on File Prior to Visit  Medication Sig Dispense Refill  . Aloe Vera GEL Apply topically as needed.        . ALPRAZolam (XANAX) 0.25 MG tablet Take 0.25 mg by mouth at bedtime as needed.       Marland Kitchen aspirin 81 MG tablet Take 81 mg by mouth daily.        Marland Kitchen atenolol (TENORMIN) 25 MG tablet Take 25 mg by mouth 2 (two) times daily.        . beclomethasone (QVAR) 80 MCG/ACT inhaler Inhale 2 puffs into the lungs 2 (two) times daily.        . Bepotastine Besilate (BEPREVE) 1.5 % SOLN Place 1 drop into both eyes 2 (two) times daily.        . Biotin 1000 MCG tablet Take 1,000 mcg by mouth daily.        . Calcium Carbonate-Vit D-Min 600-400 MG-UNIT TABS Take 1 tablet by mouth 2 (two) times daily.        Marland Kitchen docusate sodium (COLACE) 100 MG capsule Take 200 mg by mouth daily as needed.        . fish oil-omega-3 fatty acids 1000 MG capsule Take 1 capsule by mouth daily.        Marland Kitchen loratadine (CLARITIN) 10 MG tablet Take 10 mg by mouth daily.       . minoxidil (ROGAINE) 2 % external solution Apply topically 2 (two) times daily.        . mometasone (NASONEX) 50 MCG/ACT nasal spray 1 spray by Nasal route daily.        . Multiple Vitamins-Minerals (CENTRUM PO) Take 1 tablet by mouth daily.        . Multiple Vitamins-Minerals (OCUVITE PO)  Take 1 tablet by mouth daily.        . naproxen sodium (ANAPROX) 220 MG tablet Take 220 mg by mouth every 12 (twelve) hours as needed.        Marland Kitchen omeprazole (PRILOSEC) 20 MG capsule Take 20 mg by mouth daily.        . Saline (SIMPLY SALINE) 0.9 % AERS 1 spray by Nasal route 2 (two) times daily.        . simvastatin (ZOCOR) 20 MG tablet Take 20 mg by mouth at bedtime.        . traMADol (ULTRAM) 50 MG tablet Take 50 mg by mouth 3 (three) times daily as needed.             Review of Systems  Constitutional: Positive for fatigue. Negative for fever, activity change, appetite change and unexpected weight change.  HENT: Negative.   Eyes: Negative.   Respiratory: Positive for shortness of breath.   Cardiovascular: Negative for chest pain and leg swelling.  Gastrointestinal: Positive for constipation. Negative for abdominal pain, diarrhea and abdominal distention.  Endocrine:  Diabetic, diet controlled.  Genitourinary: Positive for frequency.  Musculoskeletal: Negative for back pain, joint swelling, arthralgias and gait problem.  Skin: Negative.   Neurological: Negative for dizziness, tremors, seizures, speech difficulty, weakness and numbness.  Hematological: Negative.   Psychiatric/Behavioral: Negative.        Objective:   Physical Exam  Constitutional: She is oriented to person, place, and time. She appears well-developed and well-nourished. No distress.  HENT:  Bilateral cerumen occlusion.  Eyes: Conjunctivae are normal. Pupils are equal, round, and reactive to light.  Neck: Normal range of motion. Neck supple. No JVD present. No tracheal deviation present. No thyromegaly present.  Cardiovascular: Normal rate, regular rhythm, normal heart sounds and intact distal pulses.  Exam reveals no gallop.   No murmur heard. Pulmonary/Chest: Effort normal and breath sounds normal. No respiratory distress. She has no rales. She exhibits no tenderness.  Abdominal: Soft. Bowel sounds are  normal. She exhibits no distension and no mass. There is no tenderness.  Musculoskeletal: Normal range of motion. She exhibits no edema and no tenderness.  Lymphadenopathy:    She has no cervical adenopathy.  Neurological: She is alert and oriented to person, place, and time. A cranial nerve deficit is present. Coordination normal.  Skin: No rash noted. No erythema. No pallor.  Psychiatric: She has a normal mood and affect. Her behavior is normal. Judgment and thought content normal.      Lab reports. 06/13/12 BMP: glu 94, BUN 19, CREAT. 0.85  Lipids: TC 209, trig 109, HDL 445, LDL 142  TSH 4.799  A1c 6.6    Assessment & Plan:  Unspecified essential hypertension: continue current med.  Hyperlipidemia: continue simvastatin  Tremor: no change in meds  Urinary frequency: add oxybutynin patch 3.9.mg twice weekly  Impacted cerumen, unspecified laterality: removed during this visit  Unspecified hypothyroidism: stable  Type II or unspecified type diabetes mellitus without mention of complication, not stated as uncontrolled: continue to monitor

## 2012-09-24 ENCOUNTER — Other Ambulatory Visit: Payer: Self-pay | Admitting: Geriatric Medicine

## 2012-10-16 DIAGNOSIS — J45909 Unspecified asthma, uncomplicated: Secondary | ICD-10-CM | POA: Diagnosis not present

## 2012-10-16 DIAGNOSIS — J309 Allergic rhinitis, unspecified: Secondary | ICD-10-CM | POA: Diagnosis not present

## 2012-11-28 DIAGNOSIS — J45909 Unspecified asthma, uncomplicated: Secondary | ICD-10-CM | POA: Diagnosis not present

## 2012-11-28 DIAGNOSIS — J309 Allergic rhinitis, unspecified: Secondary | ICD-10-CM | POA: Diagnosis not present

## 2012-12-10 DIAGNOSIS — R350 Frequency of micturition: Secondary | ICD-10-CM | POA: Diagnosis not present

## 2012-12-10 DIAGNOSIS — H6120 Impacted cerumen, unspecified ear: Secondary | ICD-10-CM | POA: Diagnosis not present

## 2012-12-10 DIAGNOSIS — E785 Hyperlipidemia, unspecified: Secondary | ICD-10-CM | POA: Diagnosis not present

## 2012-12-10 DIAGNOSIS — G250 Essential tremor: Secondary | ICD-10-CM | POA: Diagnosis not present

## 2012-12-10 DIAGNOSIS — E039 Hypothyroidism, unspecified: Secondary | ICD-10-CM | POA: Diagnosis not present

## 2012-12-10 DIAGNOSIS — E119 Type 2 diabetes mellitus without complications: Secondary | ICD-10-CM | POA: Diagnosis not present

## 2012-12-11 ENCOUNTER — Encounter: Payer: Self-pay | Admitting: *Deleted

## 2012-12-11 ENCOUNTER — Other Ambulatory Visit: Payer: Self-pay | Admitting: Internal Medicine

## 2012-12-16 ENCOUNTER — Encounter: Payer: Self-pay | Admitting: Internal Medicine

## 2012-12-16 ENCOUNTER — Non-Acute Institutional Stay: Payer: Medicare Other | Admitting: Internal Medicine

## 2012-12-16 VITALS — BP 120/66 | HR 68 | Ht 60.0 in | Wt 146.0 lb

## 2012-12-16 DIAGNOSIS — E785 Hyperlipidemia, unspecified: Secondary | ICD-10-CM | POA: Diagnosis not present

## 2012-12-16 DIAGNOSIS — M899 Disorder of bone, unspecified: Secondary | ICD-10-CM | POA: Diagnosis not present

## 2012-12-16 DIAGNOSIS — I10 Essential (primary) hypertension: Secondary | ICD-10-CM | POA: Diagnosis not present

## 2012-12-16 DIAGNOSIS — E039 Hypothyroidism, unspecified: Secondary | ICD-10-CM

## 2012-12-16 DIAGNOSIS — M8580 Other specified disorders of bone density and structure, unspecified site: Secondary | ICD-10-CM

## 2012-12-16 DIAGNOSIS — I517 Cardiomegaly: Secondary | ICD-10-CM

## 2012-12-16 NOTE — Patient Instructions (Signed)
Continue current medications. 

## 2012-12-16 NOTE — Progress Notes (Signed)
Subjective:    Patient ID: Christina Lawson, female    DOB: 1923-11-08, 79 y.o.   MRN: [id number]  HPI Had some asthma in Sept 2014. Saw her allergist, Dr. Willa Rough. She was put on prednisone. She is better.   Continues with hoarseness. Laryngitis comes and goes.  Prior cardiologist was Dr. Daleen Squibb , who has left [facility]. She was seen for an incidental finding of cardiomegaly on X-ray.  Complains of left knee pain. Haas seen Dr. Darrelyn Hillock several years ago and received asteroid injection. Has done pool exercises as well. She does not want another injection.  Wants to know if she needs another Bone Density.  Left eye is followed by Dr. Dione Booze. Next visit is in Feb. 2014. She thinks something is wrong. Has a continual discharge. Denies pain.  No longer using oxybutynin patch.  Tremor is unchanged. To see Dr. Anne Hahn in Nov. Using alprazolam when she needs it.  Continues simvastatin for hyperlipidemia.    Current Outpatient Prescriptions on File Prior to Visit  Medication Sig Dispense Refill  . Aloe Vera GEL Apply topically as needed.        . ALPRAZolam (XANAX) 0.25 MG tablet Take 0.25 mg by mouth at bedtime as needed.       Marland Kitchen aspirin 81 MG tablet Take 81 mg by mouth daily.        Marland Kitchen atenolol (TENORMIN) 25 MG tablet Take 25 mg by mouth 2 (two) times daily.        . Biotin 1000 MCG tablet Take 1,000 mcg by mouth daily.        . Calcium Carbonate-Vit D-Min 600-400 MG-UNIT TABS Take 1 tablet by mouth 2 (two) times daily.        Marland Kitchen docusate sodium (COLACE) 100 MG capsule Take 200 mg by mouth daily as needed.        . loratadine (CLARITIN) 10 MG tablet Take 10 mg by mouth daily.       . minoxidil (ROGAINE) 2 % external solution Apply topically 2 (two) times daily.        . mometasone (NASONEX) 50 MCG/ACT nasal spray 1 spray by Nasal route daily.        . Multiple Vitamins-Minerals (CENTRUM PO) Take 1 tablet by mouth daily.        . naproxen sodium (ANAPROX) 220 MG tablet Take 220 mg by  mouth every 12 (twelve) hours as needed.        Marland Kitchen omeprazole (PRILOSEC) 20 MG capsule Take 20 mg by mouth daily.        . Saline (SIMPLY SALINE) 0.9 % AERS 1 spray by Nasal route 2 (two) times daily.        . simvastatin (ZOCOR) 20 MG tablet TAKE 1 TABLET DAILY TO LOWER CHOLESTEROL  90 tablet  3  . traMADol (ULTRAM) 50 MG tablet Take 1 tablet (50 mg total) by mouth 3 (three) times daily as needed.  30 tablet  3   No current facility-administered medications on file prior to visit.    Review of Systems  Constitutional: Positive for fatigue. Negative for fever, activity change, appetite change and unexpected weight change.  HENT: Negative.   Eyes:       Left eye started feeling bad a couple of weeks ago. Has had a flush in the white of her eyes.  Respiratory: Positive for shortness of breath.   Cardiovascular: Negative for chest pain and leg swelling.  Gastrointestinal: Positive for constipation. Negative for abdominal pain, diarrhea  and abdominal distention.  Endocrine:       Diabetic, diet controlled.  Genitourinary: Positive for frequency.  Musculoskeletal: Negative for back pain, joint swelling, arthralgias and gait problem.  Skin: Negative.   Neurological: Negative for dizziness, tremors, seizures, speech difficulty, weakness and numbness.  Hematological: Negative.   Psychiatric/Behavioral: Negative.        Objective:BP 120/66  Pulse 68  Ht 5' (1.524 m)  Wt 146 lb (66.225 kg)  BMI 28.51 kg/m2    Physical Exam  Constitutional: She is oriented to person, place, and time. She appears well-developed and well-nourished. No distress.  HENT:  Bilateral cerumen occlusion.  Eyes: Conjunctivae are normal. Pupils are equal, round, and reactive to light.  Flushed appearance of the cornea of both eyes, but worse on the left.  Neck: Normal range of motion. Neck supple. No JVD present. No tracheal deviation present. No thyromegaly present.  Cardiovascular: Normal rate, regular rhythm,  normal heart sounds and intact distal pulses.  Exam reveals no gallop.   No murmur heard. Pulmonary/Chest: Effort normal and breath sounds normal. No respiratory distress. She has no rales. She exhibits no tenderness.  Abdominal: Soft. Bowel sounds are normal. She exhibits no distension and no mass. There is no tenderness.  Musculoskeletal: Normal range of motion. She exhibits no edema and no tenderness.  Lymphadenopathy:    She has no cervical adenopathy.  Neurological: She is alert and oriented to person, place, and time. A cranial nerve deficit is present. Coordination normal.  Skin: No rash noted. No erythema. No pallor.  Psychiatric: She has a normal mood and affect. Her behavior is normal. Judgment and thought content normal.     LABS REVIEWED 12/10/12 CMP: nl  Lipids: TC 215, trig 236,HDL 44, LDL 124  A1c 6.3       Assessment & Plan:  Unspecified essential hypertension; stable  Unspecified hypothyroidism: stable  Hyperlipidemia: mild elevation in LDL. Continue simvastatin.  Osteopenia: I do not think at her age therree is need to follow the Bone Density  Cardiomegaly: Follow with CXR next year. I have not referred to a different cardiologist.

## 2012-12-17 ENCOUNTER — Other Ambulatory Visit: Payer: Self-pay

## 2012-12-17 DIAGNOSIS — Z1231 Encounter for screening mammogram for malignant neoplasm of breast: Secondary | ICD-10-CM

## 2012-12-19 DIAGNOSIS — H01029 Squamous blepharitis unspecified eye, unspecified eyelid: Secondary | ICD-10-CM | POA: Diagnosis not present

## 2012-12-19 DIAGNOSIS — H0230 Blepharochalasis unspecified eye, unspecified eyelid: Secondary | ICD-10-CM | POA: Diagnosis not present

## 2012-12-19 DIAGNOSIS — H04129 Dry eye syndrome of unspecified lacrimal gland: Secondary | ICD-10-CM | POA: Diagnosis not present

## 2012-12-19 DIAGNOSIS — H04529 Eversion of unspecified lacrimal punctum: Secondary | ICD-10-CM | POA: Diagnosis not present

## 2012-12-31 DIAGNOSIS — Z23 Encounter for immunization: Secondary | ICD-10-CM | POA: Diagnosis not present

## 2013-01-02 ENCOUNTER — Encounter: Payer: Self-pay | Admitting: Internal Medicine

## 2013-01-06 ENCOUNTER — Non-Acute Institutional Stay: Payer: Medicare Other | Admitting: Internal Medicine

## 2013-01-06 ENCOUNTER — Encounter: Payer: Self-pay | Admitting: Internal Medicine

## 2013-01-06 VITALS — BP 144/72 | HR 60 | Ht 60.0 in | Wt 144.0 lb

## 2013-01-06 DIAGNOSIS — M25569 Pain in unspecified knee: Secondary | ICD-10-CM | POA: Diagnosis not present

## 2013-01-06 DIAGNOSIS — M25562 Pain in left knee: Secondary | ICD-10-CM | POA: Insufficient documentation

## 2013-01-06 DIAGNOSIS — I10 Essential (primary) hypertension: Secondary | ICD-10-CM

## 2013-01-06 MED ORDER — DICLOFENAC SODIUM 3 % TD GEL: TRANSDERMAL | Status: DC

## 2013-01-06 NOTE — Progress Notes (Signed)
Subjective:    Patient ID: Christina Lawson, female    DOB: September 21, 1923, 79 y.o.   MRN: [id number]  HPI .  Pain in left knee: Left knee pain. Seemed worse in the last week. Hurts in the pool. Pain comes and goes. No swelling. Does not wear compression dressing. Tramadol. She tries not to take. Sometimes uses ice or warm compresses. Pain is worst at thee lateral collateral ligament.  Unspecified essential hypertension: controlled   Current Outpatient Prescriptions on File Prior to Visit  Medication Sig Dispense Refill  . Aloe Vera GEL Apply topically as needed.        . ALPRAZolam (XANAX) 0.25 MG tablet Take 0.25 mg by mouth at bedtime as needed.       Marland Kitchen aspirin 81 MG tablet Take 81 mg by mouth daily.        Marland Kitchen atenolol (TENORMIN) 25 MG tablet Take 25 mg by mouth 2 (two) times daily.        . Biotin 1000 MCG tablet Take 1,000 mcg by mouth daily.        Marland Kitchen docusate sodium (COLACE) 100 MG capsule Take 200 mg by mouth daily as needed.        . loratadine (CLARITIN) 10 MG tablet Take 10 mg by mouth daily.       . minoxidil (ROGAINE) 2 % external solution Apply topically 2 (two) times daily.        . mometasone (NASONEX) 50 MCG/ACT nasal spray 1 spray by Nasal route daily.        . naproxen sodium (ANAPROX) 220 MG tablet Take 220 mg by mouth every 12 (twelve) hours as needed.        Marland Kitchen omeprazole (PRILOSEC) 20 MG capsule Take 20 mg by mouth daily.        . Saline (SIMPLY SALINE) 0.9 % AERS 1 spray by Nasal route 2 (two) times daily.        . simvastatin (ZOCOR) 20 MG tablet TAKE 1 TABLET DAILY TO LOWER CHOLESTEROL  90 tablet  3  . traMADol (ULTRAM) 50 MG tablet Take 1 tablet (50 mg total) by mouth 3 (three) times daily as needed.  30 tablet  3   No current facility-administered medications on file prior to visit.     Review of Systems  Constitutional: Positive for fatigue. Negative for fever, activity change, appetite change and unexpected weight change.  HENT: Negative.   Eyes:   Left eye started feeling bad a couple of weeks ago. Has had a flush in the white of her eyes.  Respiratory: Positive for shortness of breath.   Cardiovascular: Negative for chest pain and leg swelling.  Gastrointestinal: Positive for constipation. Negative for abdominal pain, diarrhea and abdominal distention.  Endocrine:       Diabetic, diet controlled.  Genitourinary: Positive for frequency.  Musculoskeletal: Positive for arthralgias (Left knee). Negative for back pain, gait problem and joint swelling.  Skin: Negative.   Neurological: Negative for dizziness, tremors, seizures, speech difficulty, weakness and numbness.  Hematological: Negative.   Psychiatric/Behavioral: Negative.        Objective:BP 144/72  Pulse 60  Ht 5' (1.524 m)  Wt 144 lb (65.318 kg)  BMI 28.12 kg/m2    Physical Exam  Constitutional: She is oriented to person, place, and time. She appears well-developed and well-nourished. No distress.  HENT:  Bilateral cerumen occlusion.  Eyes: Conjunctivae are normal. Pupils are equal, round, and reactive to light.  Flushed appearance of the cornea of  both eyes, but worse on the left.  Neck: Normal range of motion. Neck supple. No JVD present. No tracheal deviation present. No thyromegaly present.  Cardiovascular: Normal rate, regular rhythm, normal heart sounds and intact distal pulses.  Exam reveals no gallop.   No murmur heard. Pulmonary/Chest: Effort normal and breath sounds normal. No respiratory distress. She has no rales. She exhibits no tenderness.  Abdominal: Soft. Bowel sounds are normal. She exhibits no distension and no mass. There is no tenderness.  Musculoskeletal: Normal range of motion. She exhibits tenderness (Maximum tenderness at lateral collateral ligament. No joint effusion.). She exhibits no edema.  Lymphadenopathy:    She has no cervical adenopathy.  Neurological: She is alert and oriented to person, place, and time. A cranial nerve deficit is  present. Coordination normal.  Skin: No rash noted. No erythema. No pallor.  Psychiatric: She has a normal mood and affect. Her behavior is normal. Judgment and thought content normal.          Assessment & Plan:  Pain in left knee: The patient's pain is related to discomfort along the lateral collateral ligament. I told her I did not think an injection was a good idea.   - Plan: Diclofenac Sodium 3 % GEL  Unspecified essential hypertension: Controlled

## 2013-01-06 NOTE — Patient Instructions (Addendum)
Try the gel for arthritis 2-3 times daily. Call if relief is not satisfactory. Use regularly.

## 2013-01-13 DIAGNOSIS — H905 Unspecified sensorineural hearing loss: Secondary | ICD-10-CM | POA: Diagnosis not present

## 2013-01-13 DIAGNOSIS — H6120 Impacted cerumen, unspecified ear: Secondary | ICD-10-CM | POA: Diagnosis not present

## 2013-01-13 DIAGNOSIS — H6980 Other specified disorders of Eustachian tube, unspecified ear: Secondary | ICD-10-CM | POA: Diagnosis not present

## 2013-01-23 ENCOUNTER — Ambulatory Visit
Admission: RE | Admit: 2013-01-23 | Discharge: 2013-01-23 | Disposition: A | Discharge status: Home / Self Care | Payer: Medicare Other | Source: Ambulatory Visit

## 2013-01-23 DIAGNOSIS — Z1231 Encounter for screening mammogram for malignant neoplasm of breast: Secondary | ICD-10-CM

## 2013-01-31 ENCOUNTER — Other Ambulatory Visit: Payer: Self-pay | Admitting: Internal Medicine

## 2013-02-21 ENCOUNTER — Encounter: Payer: Self-pay | Admitting: Neurology

## 2013-02-21 ENCOUNTER — Ambulatory Visit (INDEPENDENT_AMBULATORY_CARE_PROVIDER_SITE_OTHER): Payer: Medicare Other | Admitting: Neurology

## 2013-02-21 VITALS — BP 128/74 | HR 60 | Wt 145.0 lb

## 2013-02-21 DIAGNOSIS — R259 Unspecified abnormal involuntary movements: Secondary | ICD-10-CM

## 2013-02-21 DIAGNOSIS — R251 Tremor, unspecified: Secondary | ICD-10-CM

## 2013-02-21 NOTE — Progress Notes (Signed)
Reason for visit: Essential tremor  Christina Lawson is an 79 y.o. female  History of present illness:  Christina Lawson is an 79 year old right-handed white female with a history of an essential tremor affecting mainly the upper extremities. The patient has done very well over the last one year, using alprazolam if needed for the tremor. The patient otherwise has not used any medications for the tremor. The patient indicates that the essential tremor does not impact her ability to perform activities of daily living. The patient has some sloppy handwriting, but her handwriting is legible. The patient is not having excessive problems with feeding herself, or using her hands otherwise. The patient has some mild gait instability, no falls have been noted. The patient notes that the tremors worse if she is upset or excited about something. The patient returns for an evaluation.  Past Medical History  Diagnosis Date  . Unspecified essential hypertension   . Hyperlipidemia   . Skin cancer 2005    nose  . Irregular heart rhythm   . Shingles 2004  . Delivery normal 1960  . Breech delivery 1962  . Macular degeneration 2012  . Tremor 2006    familiar.  dx by Dr. Hedy Camara  . Torn meniscus 2006    right knee  . Sensorineural hearing loss, bilateral 2007  . Asthma   . Osteoporosis   . Allergic rhinitis     pollen and grass  . Urinary frequency   . Unspecified hypothyroidism   . Blepharitis   . Movement disorder     Tremor    Past Surgical History  Procedure Laterality Date  . Tonsillectomy  1931  . Thyroidectomy  1955  . Cataract extraction  1974    Left, IOL implants  . Abdominal hysterectomy  1977  . Dilation and curettage of uterus  1977  . Hiatal hernia repair  1977  . Cataract extraction Right 1979    IOL implants  . Gallbladder surgery  1981  . Cosmetic surgery  1995    eye  . Skin cancer excision  2005    nose  . Cholecystectomy  1981  . Cystocele repair  1993     Family History  Problem Relation Age of Onset  . Emphysema Father   . Allergies Father   . Asthma Father   . Heart disease Maternal Grandfather   . Brain cancer Mother   . Cancer Mother     Brain  . Hypertension Mother   . Cancer Sister     Cervical  . Endometrial cancer Sister   . Cancer Sister     Breast, metastasized to bone  . Stroke      Social history:  reports that she has never smoked. She has never used smokeless tobacco. She reports that she drinks alcohol. She reports that she does not use illicit drugs.    Allergies  Allergen Reactions  . Clindamycin/Lincomycin     diarrhea  . Ibuprofen   . Penicillins   . Seasonal Ic [Cholestatin]     Medications:  Current Outpatient Prescriptions on File Prior to Visit  Medication Sig Dispense Refill  . Aloe Vera GEL Apply topically as needed.        . ALPRAZolam (XANAX) 0.25 MG tablet Take 0.25 mg by mouth at bedtime as needed.       Marland Kitchen aspirin 81 MG tablet Take 81 mg by mouth daily.        Marland Kitchen atenolol (TENORMIN) 25 MG tablet  TAKE 1 TABLET TWICE A DAY TO REDUCE TREMOR  180 tablet  0  . Biotin 1000 MCG tablet Take 1,000 mcg by mouth daily.        . Diclofenac Sodium 3 % GEL Apply 2 to 3 times daily to painful area of the left knee  200 g  3  . docusate sodium (COLACE) 100 MG capsule Take 200 mg by mouth daily as needed.        . loratadine (CLARITIN) 10 MG tablet Take 10 mg by mouth daily.       . minoxidil (ROGAINE) 2 % external solution Apply topically 2 (two) times daily.        . mometasone (NASONEX) 50 MCG/ACT nasal spray 1 spray by Nasal route daily.        . naproxen sodium (ANAPROX) 220 MG tablet Take 220 mg by mouth every 12 (twelve) hours as needed.        Marland Kitchen omeprazole (PRILOSEC) 20 MG capsule Take 20 mg by mouth daily.        . Saline (SIMPLY SALINE) 0.9 % AERS 1 spray by Nasal route 2 (two) times daily.        . simvastatin (ZOCOR) 20 MG tablet TAKE 1 TABLET DAILY TO LOWER CHOLESTEROL  90 tablet  3  .  traMADol (ULTRAM) 50 MG tablet Take 1 tablet (50 mg total) by mouth 3 (three) times daily as needed.  30 tablet  3   No current facility-administered medications on file prior to visit.    ROS:  Out of a complete 14 system review of symptoms, the patient complains only of the following symptoms, and all other reviewed systems are negative.  Eye discharge, redness Cough, wheezing, shortness of breath Environmental allergies Frequency of urination Bruising easily Tremors Anxiety Frequent waking and daytime drowsiness  Blood pressure 128/74, pulse 60, weight 145 lb (65.772 kg).  Physical Exam  General: The patient is alert and cooperative at the time of the examination.  Skin: No significant peripheral edema is noted.   Neurologic Exam  Mental status: The patient is oriented x 3.  Cranial nerves: Facial symmetry is present. Speech is normal, no aphasia or dysarthria is noted. Extraocular movements are full. Visual fields are full. No head and neck tremor, no vocal tremor noted.  Motor: The patient has good strength in all 4 extremities.  Sensory examination: Soft touch sensation on the face, arms, and legs is symmetric.  Coordination: The patient has good finger-nose-finger and heel-to-shin bilaterally. A mild intention tremor seen with finger-nose-finger.  Gait and station: The patient has a normal gait. Tandem gait is minimally unsteady. Romberg is negative. No drift is seen.  Reflexes: Deep tendon reflexes are symmetric.   Assessment/Plan:  1. Essential tremor  The patient doing quite well with the tremor at this point. The patient will continue to use the alprazolam on an as-needed basis. The patient will followup through this office in one year or as needed.  Marlan Palau MD 02/22/2013 11:27 AM  [facility] [address] [place], [place] [address]  Phone [phone] Fax [phone]

## 2013-02-21 NOTE — Patient Instructions (Signed)
Tremor  Tremor is a rhythmic, involuntary muscular contraction characterized by oscillations (to-and-fro movements) of a part of the body. The most common of all involuntary movements, tremor can affect various body parts such as the hands, head, facial structures, vocal cords, trunk, and legs; most tremors, however, occur in the hands. Tremor often accompanies neurological disorders associated with aging. Although the disorder is not life-threatening, it can be responsible for functional disability and social embarrassment.  TREATMENT   There are many types of tremor and several ways in which tremor is classified. The most common classification is by behavioral context or position. There are five categories of tremor within this classification: resting, postural, kinetic, task-specific, and psychogenic. Resting or static tremor occurs when the muscle is at rest, for example when the hands are lying on the lap. This type of tremor is often seen in patients with Parkinson's disease. Postural tremor occurs when a patient attempts to maintain posture, such as holding the hands outstretched. Postural tremors include physiological tremor, essential tremor, tremor with basal ganglia disease (also seen in patients with Parkinson's disease), cerebellar postural tremor, tremor with peripheral neuropathy, post-traumatic tremor, and alcoholic tremor. Kinetic or intention (action) tremor occurs during purposeful movement, for example during finger-to-nose testing. Task-specific tremor appears when performing goal-oriented tasks such as handwriting, speaking, or standing. This group consists of primary writing tremor, vocal tremor, and orthostatic tremor. Psychogenic tremor occurs in both older and younger patients. The key feature of this tremor is that it dramatically lessens or disappears when the patient is distracted.  PROGNOSIS  There are some treatment options available for tremor; the appropriate treatment depends on  accurate diagnosis of the cause. Some tremors respond to treatment of the underlying condition, for example in some cases of psychogenic tremor treating the patient's underlying mental problem may cause the tremor to disappear. Also, patients with tremor due to Parkinson's disease may be treated with Levodopa drug therapy. Symptomatic drug therapy is available for several other tremors as well. For those cases of tremor in which there is no effective drug treatment, physical measures such as teaching the patient to brace the affected limb during the tremor are sometimes useful. Surgical intervention such as thalamotomy or deep brain stimulation may be useful in certain cases.  Document Released: 02/17/2002 Document Revised: 05/22/2011 Document Reviewed: 02/27/2005  [employer]® Patient Information ©2014 [employer].

## 2013-03-26 ENCOUNTER — Encounter: Payer: Self-pay | Admitting: Geriatric Medicine

## 2013-03-26 ENCOUNTER — Non-Acute Institutional Stay: Payer: Medicare Other | Admitting: Geriatric Medicine

## 2013-03-26 VITALS — BP 130/68 | HR 68 | Temp 97.0°F | Wt 145.0 lb

## 2013-03-26 DIAGNOSIS — R21 Rash and other nonspecific skin eruption: Secondary | ICD-10-CM

## 2013-03-26 MED ORDER — NYSTATIN-TRIAMCINOLONE 100000-0.1 UNIT/GM-% EX OINT: 1.0000 "application " | TOPICAL_OINTMENT | Freq: Two times a day (BID) | CUTANEOUS | Status: AC

## 2013-03-26 NOTE — Progress Notes (Signed)
Patient ID: Christina Lawson, female   DOB: Sep 03, 1923, 80 y.o.   MRN: [id number]   [facility] [id number])  Code Status:  @emerg @  Chief Complaint  Patient presents with  . Rash    under both breast over one week. Has used cornstarch and Triamcinolone Acetonide cream 0.1%    HPI: This is a 80 y.o. female resident of [facility]  section.  Evaluation is requested today due to rash under breasts.  She has treated this rash with cornstarch and triamcinolone cream without improvement. Minimal itch.    Allergies  Allergen Reactions  . Clindamycin/Lincomycin     diarrhea  . Ibuprofen   . Penicillins   . Seasonal Ic [Cholestatin]       Medication List       This list is accurate as of: 03/26/13 10:53 AM.  Always use your most recent med list.               Aloe Vera Gel  Apply topically as needed.     ALPRAZolam 0.25 MG tablet  Commonly known as:  XANAX  Take 0.25 mg by mouth at bedtime as needed.     aspirin 81 MG tablet  Take 81 mg by mouth daily.     atenolol 25 MG tablet  Commonly known as:  TENORMIN  TAKE 1 TABLET TWICE A DAY TO REDUCE TREMOR     Biotin 1000 MCG tablet  Take 1,000 mcg by mouth daily.     Diclofenac Sodium 3 % Gel  Apply 2 to 3 times daily to painful area of the left knee     docusate sodium 100 MG capsule  Commonly known as:  COLACE  Take 200 mg by mouth daily as needed.     loratadine 10 MG tablet  Commonly known as:  CLARITIN  Take 10 mg by mouth daily.     minoxidil 2 % external solution  Commonly known as:  ROGAINE  Apply topically 2 (two) times daily.     mometasone 50 MCG/ACT nasal spray  Commonly known as:  NASONEX  1 spray by Nasal route daily.     nystatin-triamcinolone ointment  Commonly known as:  MYCOLOG  Apply 1 application topically 2 (two) times daily. Apply thin layer to rash under breasts, cover with dry gauze, twice daily for 5 days.     omeprazole  20 MG capsule  Commonly known as:  PRILOSEC  Take 20 mg by mouth daily.     SIMPLY SALINE 0.9 % Aers  Generic drug:  Saline  1 spray by Nasal route 2 (two) times daily.     simvastatin 20 MG tablet  Commonly known as:  ZOCOR  TAKE 1 TABLET DAILY TO LOWER CHOLESTEROL     SYSTANE 0.4-0.3 % Soln  Generic drug:  Polyethyl Glycol-Propyl Glycol  Apply to eye. One drop in both eyes once daily     traMADol 50 MG tablet  Commonly known as:  ULTRAM  Take 1 tablet (50 mg total) by mouth 3 (three) times daily as needed.         REVIEW OF SYSTEMS  DATA OBTAINED: from patient GENERAL: Feels well   No fevers, fatigue, change in appetite or weight SKIN: Rash  Under breast- see HPI RESPIRATORY: No cough, wheezing, SOB MUSCULOSKELETAL: Remains active, swims 3x/week    PHYSICAL EXAM Filed Vitals:   03/26/13 1019  BP: 130/68  Pulse: 68  Temp: 97 F (36.1 C)  TempSrc:  Oral  Weight: 145 lb (65.772 kg)   Body mass index is 28.32 kg/(m^2).  GENERAL APPEARANCE: No acute distress, appropriately groomed, normal body habitus. Alert, pleasant, conversant. SKIN: Bright red, confluent, flat rash with satellite lesions under both breasts RESPIRATORY: Breathing is even, unlabored. MUSCULOSKELETAL:  Gait is steady NEUROLOGIC: Oriented to time, place, person.  PSYCHIATRIC: Mood and affect appropriate to situation  ASSESSMENT/PLAN  Rash and nonspecific skin eruption Rash under both breasts with features of fungal overgrowth. Add Mycolog II cream BID for 5 days. Instructed patient re: decreasing moisture in this area es[pecially after swimming   Follow up: As scheduled  Malala Trenkamp T.Mandie Crabbe, NP-C 03/26/2013

## 2013-03-26 NOTE — Assessment & Plan Note (Signed)
Rash under both breasts with features of fungal overgrowth. Add Mycolog II cream BID for 5 days. Instructed patient re: decreasing moisture in this area es[pecially after swimming

## 2013-04-21 ENCOUNTER — Encounter: Payer: Self-pay | Admitting: Internal Medicine

## 2013-05-01 ENCOUNTER — Other Ambulatory Visit: Payer: Self-pay | Admitting: Internal Medicine

## 2013-05-15 DIAGNOSIS — H353190 Nonexudative age-related macular degeneration, unspecified eye, stage unspecified: Secondary | ICD-10-CM | POA: Diagnosis not present

## 2013-05-15 DIAGNOSIS — Z961 Presence of intraocular lens: Secondary | ICD-10-CM | POA: Diagnosis not present

## 2013-05-22 DIAGNOSIS — H353190 Nonexudative age-related macular degeneration, unspecified eye, stage unspecified: Secondary | ICD-10-CM | POA: Diagnosis not present

## 2013-05-22 DIAGNOSIS — H01029 Squamous blepharitis unspecified eye, unspecified eyelid: Secondary | ICD-10-CM | POA: Diagnosis not present

## 2013-05-22 DIAGNOSIS — Z961 Presence of intraocular lens: Secondary | ICD-10-CM | POA: Diagnosis not present

## 2013-05-22 DIAGNOSIS — H11829 Conjunctivochalasis, unspecified eye: Secondary | ICD-10-CM | POA: Diagnosis not present

## 2013-06-04 DIAGNOSIS — J309 Allergic rhinitis, unspecified: Secondary | ICD-10-CM | POA: Diagnosis not present

## 2013-06-04 DIAGNOSIS — J45909 Unspecified asthma, uncomplicated: Secondary | ICD-10-CM | POA: Diagnosis not present

## 2013-06-17 DIAGNOSIS — E119 Type 2 diabetes mellitus without complications: Secondary | ICD-10-CM | POA: Diagnosis not present

## 2013-06-17 DIAGNOSIS — I10 Essential (primary) hypertension: Secondary | ICD-10-CM | POA: Diagnosis not present

## 2013-06-17 DIAGNOSIS — E785 Hyperlipidemia, unspecified: Secondary | ICD-10-CM | POA: Diagnosis not present

## 2013-06-17 LAB — LIPID PANEL
CHOLESTEROL: 184 mg/dL (ref 0–200)
HDL: 48 mg/dL (ref 35–70)
LDL Cholesterol: 129 mg/dL
LDL/HDL RATIO: 3.8
Triglycerides: 143 mg/dL (ref 40–160)

## 2013-06-17 LAB — BASIC METABOLIC PANEL
BUN: 17 mg/dL (ref 4–21)
Creatinine: 0.8 mg/dL (ref 0.5–1.1)
GLUCOSE: 9 mg/dL
POTASSIUM: 44 mmol/L — AB (ref 3.4–5.3)
Sodium: 142 mmol/L (ref 137–147)

## 2013-06-17 LAB — HEMOGLOBIN A1C: Hgb A1c MFr Bld: 6.2 % — AB (ref 4.0–6.0)

## 2013-06-23 ENCOUNTER — Non-Acute Institutional Stay: Payer: Medicare Other | Admitting: Internal Medicine

## 2013-06-23 ENCOUNTER — Encounter: Payer: Self-pay | Admitting: Internal Medicine

## 2013-06-23 VITALS — BP 120/68 | HR 62 | Temp 96.9°F | Wt 143.0 lb

## 2013-06-23 DIAGNOSIS — I10 Essential (primary) hypertension: Secondary | ICD-10-CM | POA: Diagnosis not present

## 2013-06-23 DIAGNOSIS — E119 Type 2 diabetes mellitus without complications: Secondary | ICD-10-CM | POA: Diagnosis not present

## 2013-06-23 DIAGNOSIS — E039 Hypothyroidism, unspecified: Secondary | ICD-10-CM | POA: Diagnosis not present

## 2013-06-23 DIAGNOSIS — R21 Rash and other nonspecific skin eruption: Secondary | ICD-10-CM

## 2013-06-23 DIAGNOSIS — I119 Hypertensive heart disease without heart failure: Secondary | ICD-10-CM

## 2013-06-23 DIAGNOSIS — R259 Unspecified abnormal involuntary movements: Secondary | ICD-10-CM

## 2013-06-23 DIAGNOSIS — R350 Frequency of micturition: Secondary | ICD-10-CM

## 2013-06-23 DIAGNOSIS — R251 Tremor, unspecified: Secondary | ICD-10-CM

## 2013-06-23 DIAGNOSIS — E785 Hyperlipidemia, unspecified: Secondary | ICD-10-CM

## 2013-06-23 DIAGNOSIS — R233 Spontaneous ecchymoses: Secondary | ICD-10-CM | POA: Insufficient documentation

## 2013-06-23 NOTE — Progress Notes (Signed)
Patient ID: Christina Lawson, female   DOB: [birth year], 80 y.o.   MRN: [id number]    Location:  [facility]   Place of Service: OFFICE    Allergies  Allergen Reactions  . Clindamycin/Lincomycin     diarrhea  . Ibuprofen   . Penicillins   . Seasonal Ic [Cholestatin]     Chief Complaint  Patient presents with  . Medical Managment of Chronic Issues    blood pressure, blood sugar, cholerol, thyroid  . Diarrhea    Sunday morning went twice, no nausea or vomiting. Happened a month ago too.     HPI:  Diagnosed with blepharitis. Using ophthalmic drops. Followed by ophth. Got new glasses last week.  Sleeping a lot. Naps in the afternoon.. Has some trouble at night falling asleep. Gets about 8 hours sleep per night. This is a new problem. Rises 2-3 times at night to use bathroom.  Concerned she takes too much medication.  Left 5th finger clicks with movement.   Has paresthesias mostly in the left hand. Like pins and needles.  Sometimes the omeprazole does not seem strong enough, so she add Tums.  "Skin is terrible". Bruises easily. Has psoriasis.  She wants to stop atenolol and ASA.  Medications: Patient's Medications  New Prescriptions   No medications on file  Previous Medications   ALOE VERA GEL    Apply topically as needed.     ALPRAZOLAM (XANAX) 0.25 MG TABLET    Take 0.25 mg by mouth at bedtime as needed.    ASPIRIN 81 MG TABLET    Take 81 mg by mouth daily.     ATENOLOL (TENORMIN) 25 MG TABLET    TAKE 1 TABLET TWICE A DAY TO REDUCE TREMOR   BIOTIN 1000 MCG TABLET    Take 1,000 mcg by mouth daily.     CICLESONIDE (ALVESCO) 160 MCG/ACT INHALER    Inhale 2 puffs into the lungs 2 (two) times daily.   DICLOFENAC SODIUM 3 % GEL    Apply 2 to 3 times daily to painful area of the left knee   DOCUSATE SODIUM (COLACE) 100 MG CAPSULE    Take 200 mg by mouth daily as needed.     LORATADINE (CLARITIN) 10 MG TABLET    Take 10 mg by mouth daily.    MINOXIDIL (ROGAINE) 2 % EXTERNAL  SOLUTION    Apply topically 2 (two) times daily.     MOMETASONE (NASONEX) 50 MCG/ACT NASAL SPRAY    1 spray by Nasal route daily.     MONTELUKAST (SINGULAIR) 10 MG TABLET    Take 10 mg by mouth. One daily for cough   NYSTATIN-TRIAMCINOLONE EX    Apply topically. Apply thin layer to rash under breast twice daily for 5 days   OMEPRAZOLE (PRILOSEC) 20 MG CAPSULE    Take 20 mg by mouth daily.     POLYETHYL GLYCOL-PROPYL GLYCOL (SYSTANE) 0.4-0.3 % SOLN    Apply to eye. One drop in both eyes once daily   SALINE (SIMPLY SALINE) 0.9 % AERS    1 spray by Nasal route 2 (two) times daily.     SIMVASTATIN (ZOCOR) 20 MG TABLET    TAKE 1 TABLET DAILY TO LOWER CHOLESTEROL   TRAMADOL (ULTRAM) 50 MG TABLET    Take 1 tablet (50 mg total) by mouth 3 (three) times daily as needed.   TRIAMCINOLONE CREAM (KENALOG) 0.1 %    Apply 1 application topically 2 (two) times daily.  Modified Medications  No medications on file  Discontinued Medications   No medications on file     Review of Systems  Constitutional: Positive for fatigue. Negative for fever, activity change, appetite change and unexpected weight change.  HENT: Negative.   Respiratory: Positive for shortness of breath.   Cardiovascular: Negative for chest pain and leg swelling.  Gastrointestinal: Positive for constipation. Negative for abdominal pain, diarrhea and abdominal distention.  Endocrine:       Diabetic, diet controlled.  Genitourinary: Positive for frequency.  Musculoskeletal: Positive for arthralgias (Left knee). Negative for back pain, gait problem and joint swelling.  Skin:       C/o easy bruising. She believes related to ASA.  Neurological: Negative for dizziness, tremors, seizures, speech difficulty, weakness and numbness.  Hematological: Negative.   Psychiatric/Behavioral: Positive for sleep disturbance. The patient is nervous/anxious.     Filed Vitals:   06/23/13 1351  BP: 120/68  Pulse: 62  Temp: 96.9 F (36.1 C)  TempSrc:  Oral  Weight: 143 lb (64.864 kg)   Physical Exam  Constitutional: She is oriented to person, place, and time. She appears well-developed and well-nourished. No distress.  HENT:  Bilateral cerumen occlusion.  Eyes: Conjunctivae are normal. Pupils are equal, round, and reactive to light.  Neck: Normal range of motion. Neck supple. No JVD present. No tracheal deviation present. No thyromegaly present.  Cardiovascular: Normal rate, regular rhythm, normal heart sounds and intact distal pulses.  Exam reveals no gallop.   No murmur heard. Pulmonary/Chest: Effort normal and breath sounds normal. No respiratory distress. She has no rales. She exhibits no tenderness.  Abdominal: Soft. Bowel sounds are normal. She exhibits no distension and no mass. There is no tenderness.  Musculoskeletal: Normal range of motion. She exhibits tenderness (Maximum tenderness at lateral collateral ligament. No joint effusion.). She exhibits no edema.  Lymphadenopathy:    She has no cervical adenopathy.  Neurological: She is alert and oriented to person, place, and time. A cranial nerve deficit is present. Coordination normal.  Skin: No erythema. No pallor.  Scattered senile ecchymoses.  Psychiatric: She has a normal mood and affect. Her behavior is normal. Judgment and thought content normal.     Labs reviewed: Nursing Home on 06/23/2013  Component Date Value Ref Range Status  . Glucose 06/17/2013 9   Final  . BUN 06/17/2013 17  4 - 21 mg/dL Final  . Creatinine 06/17/2013 0.8  0.5 - 1.1 mg/dL Final  . Potassium 06/17/2013 44.0* 3.4 - 5.3 mmol/L Final  . Sodium 06/17/2013 142  137 - 147 mmol/L Final  . LDl/HDL Ratio 06/17/2013 3.8   Final  . Triglycerides 06/17/2013 143  40 - 160 mg/dL Final  . Cholesterol 06/17/2013 184  0 - 200 mg/dL Final  . HDL 06/17/2013 48  35 - 70 mg/dL Final  . LDL Cholesterol 06/17/2013 129   Final  . Hemoglobin A1C 06/17/2013 6.2* 4.0 - 6.0 % Final      Assessment/Plan  1.  Cardiomegaly - hypertensive stable  2. Hypertension controlled  3. Unspecified hypothyroidism Recheck next visit  4. Type II or unspecified type diabetes mellitus without mention of complication, not stated as uncontrolled controlled  5. Hyperlipidemia controlled  6. Rash and nonspecific skin eruption senile ecchymoses and psoriasis  7. Tremor Unchanged. Stop atenolol at pt. request  8. Urinary frequency Improved. Only up 2-3 times at night  9. Senile ecchymosis Observe. Stop ASA.

## 2013-07-09 ENCOUNTER — Encounter: Payer: Self-pay | Admitting: Internal Medicine

## 2013-07-09 DIAGNOSIS — J309 Allergic rhinitis, unspecified: Secondary | ICD-10-CM | POA: Diagnosis not present

## 2013-07-09 DIAGNOSIS — J45909 Unspecified asthma, uncomplicated: Secondary | ICD-10-CM | POA: Diagnosis not present

## 2013-07-10 DIAGNOSIS — D692 Other nonthrombocytopenic purpura: Secondary | ICD-10-CM | POA: Diagnosis not present

## 2013-07-10 DIAGNOSIS — Z85828 Personal history of other malignant neoplasm of skin: Secondary | ICD-10-CM | POA: Diagnosis not present

## 2013-07-10 DIAGNOSIS — L821 Other seborrheic keratosis: Secondary | ICD-10-CM | POA: Diagnosis not present

## 2013-07-10 DIAGNOSIS — D239 Other benign neoplasm of skin, unspecified: Secondary | ICD-10-CM | POA: Diagnosis not present

## 2013-07-10 DIAGNOSIS — L819 Disorder of pigmentation, unspecified: Secondary | ICD-10-CM | POA: Diagnosis not present

## 2013-07-10 DIAGNOSIS — D1801 Hemangioma of skin and subcutaneous tissue: Secondary | ICD-10-CM | POA: Diagnosis not present

## 2013-07-10 DIAGNOSIS — L538 Other specified erythematous conditions: Secondary | ICD-10-CM | POA: Diagnosis not present

## 2013-08-20 ENCOUNTER — Non-Acute Institutional Stay: Payer: Medicare Other | Admitting: Geriatric Medicine

## 2013-08-20 ENCOUNTER — Encounter: Payer: Self-pay | Admitting: Geriatric Medicine

## 2013-08-20 VITALS — BP 140/84 | HR 77 | Temp 97.5°F | Wt 143.0 lb

## 2013-08-20 DIAGNOSIS — M65839 Other synovitis and tenosynovitis, unspecified forearm: Secondary | ICD-10-CM

## 2013-08-20 DIAGNOSIS — R609 Edema, unspecified: Secondary | ICD-10-CM | POA: Insufficient documentation

## 2013-08-20 DIAGNOSIS — M65849 Other synovitis and tenosynovitis, unspecified hand: Secondary | ICD-10-CM

## 2013-08-20 DIAGNOSIS — M778 Other enthesopathies, not elsewhere classified: Secondary | ICD-10-CM | POA: Insufficient documentation

## 2013-08-20 DIAGNOSIS — M779 Enthesopathy, unspecified: Principal | ICD-10-CM

## 2013-08-20 NOTE — Assessment & Plan Note (Signed)
Mild lower extremity edema, left worse than right noted by patient over the last few weeks. She is not experiencing any change in her activity status, no shortness of breath no chest pain or cough. This edema is most likely due to    mild venous insufficiency rather than heart failure.  Recommended leg elevation during the day or if the swelling is  bothersome to her she could use compression hose.

## 2013-08-20 NOTE — Progress Notes (Signed)
Patient ID: Christina Lawson, female   DOB: 08/22/1923, 80 y.o.   MRN: [id number]   [facility] [phone]   Contact Information   Name Relation Home Work [place] Daughter [phone]         Chief Complaint  Patient presents with  . Hand Pain    left hand little finger hurts goes down into hand for couple of weeks  . Foot Swelling    left ankle swelling at night for couple of weeks    HPI: This is a 80 y.o. female resident of [place]  section.  Evaluation is requested today due to musculoskeletal complaints.  The patient describes her left pinkie as being sore and clicking with flexion. She applied diclofenac and also used heat therapy. The clicking is no longer there but she does get some aching in the lateral aspect of her hand.  Patient notes left ankle swelling by the end of the day the last couple of weeks. Swelling is present in the morning. She does continue to be active on most days with walking, pool exercise 3 days a week. She does rest during the day with her feet on a small ottoman. Patient expresses concern that the swelling is due to a problem with her heart. Epic records reviewed and shows she does have history of cardiomegaly, this was worked up in 2012. Echocardiogram at that time showed an ejection fraction of 60-65% with evidence of mild diastolic dysfunction (stage I). There were no significant abnormalities with the valves.     Allergies  Allergen Reactions  . Clindamycin/Lincomycin     diarrhea  . Ibuprofen   . Penicillins   . Seasonal Ic [Cholestatin]     MEDICATIONS -     Medication List       This list is accurate as of: 08/20/13 10:44 AM.  Always use your most recent med list.               Aloe Vera Gel  Apply topically as needed.     ALPRAZolam 0.25 MG tablet  Commonly known as:  XANAX  Take 0.25 mg by mouth at bedtime as needed.     Biotin 1000 MCG tablet    Take 1,000 mcg by mouth daily.     ciclesonide 160 MCG/ACT inhaler  Commonly known as:  ALVESCO  Inhale 2 puffs into the lungs 2 (two) times daily.     DERMEND BRUISE FORMULA EX  Apply topically. Apply once daily     Diclofenac Sodium 3 % Gel  Apply 2 to 3 times daily to painful area of the left knee     docusate sodium 100 MG capsule  Commonly known as:  COLACE  Take 200 mg by mouth daily as needed.     loratadine 10 MG tablet  Commonly known as:  CLARITIN  Take 10 mg by mouth daily. As needed     minoxidil 2 % external solution  Commonly known as:  ROGAINE  Apply topically 2 (two) times daily.     mometasone 0.1 % ointment  Commonly known as:  ELOCON  Apply topically. Apply to eye lids in morning     mometasone 50 MCG/ACT nasal spray  Commonly known as:  NASONEX  1 spray by Nasal route daily.     montelukast 10 MG tablet  Commonly known as:  SINGULAIR  Take 10 mg by mouth. One daily for cough     NYSTATIN-TRIAMCINOLONE EX  Apply topically. Apply thin layer to rash under breast twice daily for 5 days     omeprazole 20 MG capsule  Commonly known as:  PRILOSEC  Take 20 mg by mouth daily.     SIMPLY SALINE 0.9 % Aers  Generic drug:  Saline  1 spray by Nasal route 2 (two) times daily.     simvastatin 20 MG tablet  Commonly known as:  ZOCOR  TAKE 1 TABLET DAILY TO LOWER CHOLESTEROL     SYSTANE 0.4-0.3 % Soln  Generic drug:  Polyethyl Glycol-Propyl Glycol  Apply to eye. One drop in both eyes once daily     traMADol 50 MG tablet  Commonly known as:  ULTRAM  Take 1 tablet (50 mg total) by mouth 3 (three) times daily as needed.     triamcinolone cream 0.1 %  Commonly known as:  KENALOG  Apply 1 application topically 2 (two) times daily.         DATA REVIEWED  Radiologic Exams:   Laboratory Studies  REVIEW OF SYSTEMS  DATA OBTAINED: from patient, nurse, medical record, caregiver, family member GENERAL: Feels well  No recent fever, fatigue, change in  activity status, appetite. "I'm heavier than I've ever been"  RESPIRATORY: No cough, wheezing, SOB CARDIAC: No chest pain, palpitations. No edema GI: No abdominal pain  No Nausea,vomiting,diarrhea or constipation  No heartburn or reflux  MUSCULOSKELETAL: Mild rt. knee pain, stiffness. Left 4thfinger discomfort -see HPI   No back pain    No muscle ache, pain, weakness    Gait is steady    No recent falls  NEUROLOGIC: No dizziness, fainting, headache, numbness No change in mental status  PSYCHIATRIC: No feelings of anxiety, depression  Sleeps well       PHYSICAL EXAM Filed Vitals:   08/20/13 1018  BP: 140/84  Pulse: 77  Temp: 97.5 F (36.4 C)  TempSrc: Oral  Weight: 143 lb (64.864 kg)  SpO2: 99%   Body mass index is 27.93 kg/(m^2).  GENERAL APPEARANCE: No acute distress, appropriately groomed, normal body habitus Alert, pleasant, conversant. SKIN: No diaphoresis, rash, wound HEAD: Normocephalic, atraumatic EYES: Conjunctiva/lids clear  RESPIRATORY: Breathing is even, unlabored  Lung sounds are clear and full  CARDIOVASCULAR: Heart RRR   No murmur or extra heart sounds   VENOUS: Mildly prominent medial/posterior calf veins   EDEMA: Trace ankle edema on left MUSCULOSKELETAL.  Gait is steady PSYCHIATRIC: Mood and affect appropriate to situation    ASSESSMENT/PLAN  Tendinitis of finger Patient's description of pain and clicking in her fourth finger consistent with a tendinitis. She self medicated with use of diclofenac which appears to have reduce the inflammation. Did instruct patient on some gentle hand and wrist stretches that might prevent recurrence of this problem. She does use a computer daily.  Edema Mild lower extremity edema, left worse than right noted by patient over the last few weeks. She is not experiencing any change in her activity status, no shortness of breath no chest pain or cough. This edema is most likely due to    mild venous insufficiency rather than heart  failure.  Recommended leg elevation during the day or if the swelling is  bothersome to her she could use compression hose.    Family/ staff Communication:     Labs/tests ordered:    Follow up: Return if symptoms worsen or fail to improve, for As scheduled.  Mardene Celeste, NP-C [place] [phone]  08/20/2013

## 2013-08-20 NOTE — Assessment & Plan Note (Signed)
Patient's description of pain and clicking in her fourth finger consistent with a tendinitis. She self medicated with use of diclofenac which appears to have reduce the inflammation. Did instruct patient on some gentle hand and wrist stretches that might prevent recurrence of this problem. She does use a computer daily.

## 2013-09-04 DIAGNOSIS — L679 Hair color and hair shaft abnormality, unspecified: Secondary | ICD-10-CM | POA: Diagnosis not present

## 2013-09-04 DIAGNOSIS — L739 Follicular disorder, unspecified: Secondary | ICD-10-CM | POA: Diagnosis not present

## 2013-09-04 DIAGNOSIS — L738 Other specified follicular disorders: Secondary | ICD-10-CM | POA: Diagnosis not present

## 2013-11-07 DIAGNOSIS — H01009 Unspecified blepharitis unspecified eye, unspecified eyelid: Secondary | ICD-10-CM | POA: Diagnosis not present

## 2013-11-07 DIAGNOSIS — H52209 Unspecified astigmatism, unspecified eye: Secondary | ICD-10-CM | POA: Diagnosis not present

## 2013-11-07 DIAGNOSIS — H04129 Dry eye syndrome of unspecified lacrimal gland: Secondary | ICD-10-CM | POA: Diagnosis not present

## 2013-11-24 DIAGNOSIS — L820 Inflamed seborrheic keratosis: Secondary | ICD-10-CM | POA: Diagnosis not present

## 2013-11-24 DIAGNOSIS — L738 Other specified follicular disorders: Secondary | ICD-10-CM | POA: Diagnosis not present

## 2013-11-24 DIAGNOSIS — IMO0002 Reserved for concepts with insufficient information to code with codable children: Secondary | ICD-10-CM | POA: Diagnosis not present

## 2013-11-27 DIAGNOSIS — J45909 Unspecified asthma, uncomplicated: Secondary | ICD-10-CM | POA: Diagnosis not present

## 2013-11-27 DIAGNOSIS — J309 Allergic rhinitis, unspecified: Secondary | ICD-10-CM | POA: Diagnosis not present

## 2013-12-24 ENCOUNTER — Other Ambulatory Visit: Payer: Self-pay

## 2013-12-24 DIAGNOSIS — Z1231 Encounter for screening mammogram for malignant neoplasm of breast: Secondary | ICD-10-CM

## 2013-12-24 DIAGNOSIS — Z23 Encounter for immunization: Secondary | ICD-10-CM | POA: Diagnosis not present

## 2014-01-05 ENCOUNTER — Encounter: Payer: Self-pay | Admitting: Nurse Practitioner

## 2014-01-05 ENCOUNTER — Ambulatory Visit (INDEPENDENT_AMBULATORY_CARE_PROVIDER_SITE_OTHER): Payer: Medicare Other | Admitting: Nurse Practitioner

## 2014-01-05 VITALS — BP 150/78 | HR 85 | Ht 61.0 in | Wt 142.4 lb

## 2014-01-05 DIAGNOSIS — R251 Tremor, unspecified: Secondary | ICD-10-CM

## 2014-01-05 MED ORDER — ALPRAZOLAM 0.25 MG PO TABS: 0.2500 mg | ORAL_TABLET | Freq: Every evening | ORAL | Status: DC | PRN

## 2014-01-05 NOTE — Patient Instructions (Signed)
Continue Alprazolam for essential tremor as needed. Continue water exercises F/U yearly and prn

## 2014-01-05 NOTE — Progress Notes (Signed)
[facility]  PATIENT: Christina Lawson DOB: 06-02-1923   REASON FOR VISIT: Follow-up for essential tremor   HISTORY OF PRESENT ILLNESS:Christina Lawson is an 80 year old right-handed white female with a history of an essential tremor affecting mainly the upper extremities. The patient has done very well over the last one year, using alprazolam if needed for the tremor. The patient otherwise has not used any medications for the tremor. The patient indicates that the essential tremor does not impact her ability to perform activities of daily living. The patient has some sloppy handwriting, but her handwriting is legible. The patient is not having excessive problems with feeding herself, or using her hands otherwise. The patient has some mild gait instability, no falls have been noted. The patient notes that the tremors worse if she is upset or excited about something. The patient currently lives at [place]. She is in water aerobics 3 times a week . The patient returns for an evaluation. She needs refills on her medication   REVIEW OF SYSTEMS: Full 14 system review of systems performed and notable only for those listed, all others are neg:  Constitutional: N/A  Cardiovascular: N/A  Ear/Nose/Throat: Hearing loss Skin: N/A  Eyes: Light sensitivity Respiratory: N/A  Gastroitestinal: N/A  Hematology/Lymphatic: N/A  Endocrine: N/A Musculoskeletal:N/A  Allergy/Immunology: N/A  Neurological: Tremors Psychiatric: N/A Sleep : NA   ALLERGIES: Allergies  Allergen Reactions  . Clindamycin/Lincomycin     diarrhea  . Ibuprofen   . Penicillins   . Seasonal Ic [Cholestatin]     HOME MEDICATIONS: Outpatient Prescriptions Prior to Visit  Medication Sig Dispense Refill  . Aloe Vera GEL Apply topically as needed.        . ALPRAZolam (XANAX) 0.25 MG tablet Take 0.25 mg by mouth at bedtime as needed.       . Biotin 1000 MCG tablet Take 1,000 mcg by mouth daily.        .  Diclofenac Sodium 3 % GEL Apply 2 to 3 times daily to painful area of the left knee  200 g  3  . docusate sodium (COLACE) 100 MG capsule Take 200 mg by mouth daily as needed.        . Emollient (DERMEND BRUISE FORMULA EX) Apply topically. Apply once daily      . loratadine (CLARITIN) 10 MG tablet Take 10 mg by mouth daily. As needed      . minoxidil (ROGAINE) 2 % external solution Apply topically 2 (two) times daily.        . mometasone (ELOCON) 0.1 % ointment Apply topically. Apply to eye lids in morning      . mometasone (NASONEX) 50 MCG/ACT nasal spray 1 spray by Nasal route daily.        . montelukast (SINGULAIR) 10 MG tablet Take 10 mg by mouth. One daily for cough      . NYSTATIN-TRIAMCINOLONE EX Apply topically. Apply thin layer to rash under breast twice daily for 5 days      . omeprazole (PRILOSEC) 20 MG capsule Take 20 mg by mouth daily.        . Saline (SIMPLY SALINE) 0.9 % AERS 1 spray by Nasal route 2 (two) times daily.        . simvastatin (ZOCOR) 20 MG tablet TAKE 1 TABLET DAILY TO LOWER CHOLESTEROL  90 tablet  3  . traMADol (ULTRAM) 50 MG tablet Take 1 tablet (50 mg total) by mouth 3 (three) times daily as needed.  30 tablet  3  . triamcinolone cream (KENALOG) 0.1 % Apply 1 application topically 2 (two) times daily.      . ciclesonide (ALVESCO) 160 MCG/ACT inhaler Inhale 2 puffs into the lungs 2 (two) times daily.      Vladimir Faster Glycol-Propyl Glycol (SYSTANE) 0.4-0.3 % SOLN Apply to eye. One drop in both eyes once daily       No facility-administered medications prior to visit.    PAST MEDICAL HISTORY: Past Medical History  Diagnosis Date  . Unspecified essential hypertension   . Hyperlipidemia   . Skin cancer 2005    nose  . Irregular heart rhythm   . Shingles 2004  . Delivery normal 1960  . Breech delivery 1962  . Macular degeneration 2012  . Tremor 2006    familiar.  dx by Dr. Quillian Quince  . Torn meniscus 2006    right knee  . Sensorineural hearing loss,  bilateral 2007  . Asthma   . Osteoporosis   . Allergic rhinitis     pollen and grass  . Urinary frequency   . Unspecified hypothyroidism   . Blepharitis   . Movement disorder     Tremor    PAST SURGICAL HISTORY: Past Surgical History  Procedure Laterality Date  . Tonsillectomy  1931  . Thyroidectomy  1955  . Cataract extraction  1974    Left, IOL implants  . Abdominal hysterectomy  1977  . Dilation and curettage of uterus  1977  . Hiatal hernia repair  1977  . Cataract extraction Right 1979    IOL implants  . Gallbladder surgery  1981  . Cosmetic surgery  1995    eye  . Skin cancer excision  2005    nose  . Cholecystectomy  1981  . Cystocele repair  1993    FAMILY HISTORY: Family History  Problem Relation Age of Onset  . Emphysema Father   . Allergies Father   . Asthma Father   . Heart disease Maternal Grandfather   . Brain cancer Mother   . Cancer Mother     Brain  . Hypertension Mother   . Cancer Sister     Cervical  . Endometrial cancer Sister   . Cancer Sister     Breast, metastasized to bone  . Stroke      SOCIAL HISTORY: History   Social History  . Marital Status: Single    Spouse Name: N/A    Number of Children: 2  . Years of Education: college   Occupational History  . Education officer, museum     retired  . RETIRED- SOCIAL WORKER    Social History Main Topics  . Smoking status: Never Smoker   . Smokeless tobacco: Never Used  . Alcohol Use: Yes     Comment: on occ.  . Drug Use: No  . Sexual Activity: No   Other Topics Concern  . Not on file   Social History Narrative   Pt is widowed.   Lives at [place]:   01/05/14 1459  BP: 173/92  Pulse: 85  Height: 5\' 1"  (1.549 m)  Weight: 142 lb 6.4 oz (64.592 kg)   Body mass index is 26.92 kg/(m^2). General: The patient is alert and cooperative at the time of the examination.  Skin: No significant peripheral edema is noted.  Neurologic Exam  Mental  status: The patient is oriented x 3.  Cranial nerves: Facial symmetry is present. Speech is normal, no aphasia  or dysarthria is noted. Extraocular movements are full. Visual fields are full. No head and neck tremor, no vocal tremor noted.  Motor: The patient has good strength in all 4 extremities.  Sensory examination: Soft touch sensation on the face, arms, and legs is symmetric.  Coordination: The patient has good finger-nose-finger and heel-to-shin bilaterally. A mild intention tremor seen with finger-nose-finger.  Gait and station: The patient has a normal gait. Tandem gait is minimally unsteady. Romberg is negative. No drift is seen.  Reflexes: Deep tendon reflexes are symmetric.     DIAGNOSTIC DATA (LABS, IMAGING, TESTING) - I reviewed patient records, labs, notes, testing and imaging myself where available.      Component Value Date/Time   NA 142 06/17/2013   K 44.0* 06/17/2013   BUN 17 06/17/2013   CREATININE 0.8 06/17/2013   Lab Results  Component Value Date   CHOL 184 06/17/2013   HDL 48 06/17/2013   LDLCALC 129 06/17/2013   TRIG 143 06/17/2013   Lab Results  Component Value Date   HGBA1C 6.2* 06/17/2013    ASSESSMENT AND PLAN  80 y.o. year old female  has a past medical history of Unspecified essential hypertension; Macular degeneration (2012); Tremor (2006); here to follow-up. Her essential tremor is well controlled on Xanax when necessary.The patient is a current patient of Dr. Jannifer Franklin  who is out of the office today . This note is sent to the work in doctor.     Continue Alprazolam for essential tremor as needed. Continue water exercises F/U yearly and prn Christina Lawson, [facility], [facility], APRN  [facility] [address], [place], [place] [address] [phone]

## 2014-01-06 DIAGNOSIS — H903 Sensorineural hearing loss, bilateral: Secondary | ICD-10-CM | POA: Diagnosis not present

## 2014-01-06 DIAGNOSIS — E1100 Type 2 diabetes mellitus with hyperosmolarity without nonketotic hyperglycemic-hyperosmolar coma (NKHHC): Secondary | ICD-10-CM | POA: Diagnosis not present

## 2014-01-06 DIAGNOSIS — I10 Essential (primary) hypertension: Secondary | ICD-10-CM | POA: Diagnosis not present

## 2014-01-06 DIAGNOSIS — E785 Hyperlipidemia, unspecified: Secondary | ICD-10-CM | POA: Diagnosis not present

## 2014-01-06 DIAGNOSIS — E039 Hypothyroidism, unspecified: Secondary | ICD-10-CM | POA: Diagnosis not present

## 2014-01-06 LAB — LIPID PANEL
CHOLESTEROL: 167 mg/dL (ref 0–200)
HDL: 56 mg/dL (ref 35–70)
LDL CALC: 99 mg/dL
LDL/HDL RATIO: 1.8
TRIGLYCERIDES: 122 mg/dL (ref 40–160)

## 2014-01-06 LAB — TSH: TSH: 5 u[IU]/mL (ref 0.41–5.90)

## 2014-01-06 LAB — HEPATIC FUNCTION PANEL
ALT: 15 U/L (ref 7–35)
AST: 17 U/L (ref 13–35)
Alkaline Phosphatase: 49 U/L (ref 25–125)
Bilirubin, Total: 0.6 mg/dL

## 2014-01-06 LAB — BASIC METABOLIC PANEL
BUN: 14 mg/dL (ref 4–21)
Creatinine: 0.9 mg/dL (ref <=1.1)
Glucose: 91 mg/dL
Potassium: 4.5 mmol/L (ref 3.4–5.3)
SODIUM: 140 mmol/L (ref 137–147)

## 2014-01-06 LAB — HEMOGLOBIN A1C: Hgb A1c MFr Bld: 6.1 % — AB (ref 4.0–6.0)

## 2014-01-12 ENCOUNTER — Encounter: Payer: Self-pay | Admitting: Internal Medicine

## 2014-01-12 ENCOUNTER — Non-Acute Institutional Stay: Payer: Medicare Other | Admitting: Internal Medicine

## 2014-01-12 VITALS — BP 122/72 | HR 93 | Temp 97.8°F | Resp 10 | Ht 61.0 in | Wt 140.0 lb

## 2014-01-12 DIAGNOSIS — G250 Essential tremor: Secondary | ICD-10-CM | POA: Diagnosis not present

## 2014-01-12 DIAGNOSIS — E119 Type 2 diabetes mellitus without complications: Secondary | ICD-10-CM

## 2014-01-12 DIAGNOSIS — E785 Hyperlipidemia, unspecified: Secondary | ICD-10-CM

## 2014-01-12 DIAGNOSIS — H109 Unspecified conjunctivitis: Secondary | ICD-10-CM | POA: Insufficient documentation

## 2014-01-12 DIAGNOSIS — I10 Essential (primary) hypertension: Secondary | ICD-10-CM | POA: Diagnosis not present

## 2014-01-12 DIAGNOSIS — R609 Edema, unspecified: Secondary | ICD-10-CM

## 2014-01-12 DIAGNOSIS — E039 Hypothyroidism, unspecified: Secondary | ICD-10-CM

## 2014-01-12 NOTE — Progress Notes (Signed)
Patient ID: Christina Lawson, female   DOB: Nov 26, 1923, 80 y.o.   MRN: [id number]    HISTORY AND PHYSICAL  Location:  Diplomatic Services operational officer of Service: Clinic (12)   Extended Emergency Contact Information Primary Emergency Contact: Rossi,Lynn Address: [place] Phone: [phone] Relation: Daughter   Has Living Will   Chief Complaint  Patient presents with  . Annual Exam    Yearly check-up, discuss labs(copy printed)   . Tremors    Ongoing concern   . Hearing Problem    Hearing is worse after seeing specialist x 3   . Eye Problem    Teary eyes and redness x 2 weeks or longer   . Weight Loss    Patient notice unexplained weight loss     HPI:  Generally feeling well except for mild conjunctivitis. This has been a relapsing problem. Says Patanol did not help in the past.  Tremor, essential: mild and does not interfere with life.  Hyperlipidemia: controlled  Essential hypertension: controlled  Type 2 diabetes mellitus without complication: controlled  Edema: trace in the left ankle  Hypothyroidism, unspecified hypothyroidism type: mild elevation in the TSH.    Past Medical History  Diagnosis Date  . Unspecified essential hypertension   . Hyperlipidemia   . Skin cancer 2005    nose  . Irregular heart rhythm   . Shingles 2004  . Delivery normal 1960  . Breech delivery 1962  . Macular degeneration 2012  . Tremor 2006    familiar.  dx by Dr. Quillian Quince  . Torn meniscus 2006    right knee  . Sensorineural hearing loss, bilateral 2007  . Asthma   . Osteoporosis   . Allergic rhinitis     pollen and grass  . Urinary frequency   . Unspecified hypothyroidism   . Blepharitis   . Movement disorder     Tremor    Past Surgical History  Procedure Laterality Date  . Tonsillectomy  1931  . Thyroidectomy  1955  . Cataract extraction  1974    Left, IOL implants  .  Abdominal hysterectomy  1977  . Dilation and curettage of uterus  1977  . Hiatal hernia repair  1977  . Cataract extraction Right 1979    IOL implants  . Gallbladder surgery  1981  . Cosmetic surgery  1995    eye  . Skin cancer excision  2005    nose  . Cholecystectomy  1981  . Cystocele repair  1993    Patient Care Team: Estill Dooms, MD as PCP - General (Internal Medicine) [place] Kathee Delton, MD as Consulting Physician (Pulmonary Disease) Renella Cunas, MD as Consulting Physician (Cardiology) Clent Jacks, MD as Consulting Physician (Ophthalmology) Tobi Bastos, MD as Consulting Physician (Orthopedic Surgery) Kathrynn Ducking, MD as Consulting Physician (Neurology) Devra Dopp, MD as Referring Physician (Dermatology) Gean Quint, MD as Consulting Physician (Allergy and Immunology) Rob Hickman, MD as Consulting Physician (Ophthalmology) Logan Bores, MD as Consulting Physician (Obstetrics and Gynecology) Fannie Knee, MD as Consulting Physician (Otolaryngology)  History   Social History  . Marital Status: Single    Spouse Name: N/A    Number of Children: 2  . Years of Education: college   Occupational History  . retired Education officer, museum     retired   Science writer History  Main Topics  . Smoking status: Never Smoker   . Smokeless tobacco: Never Used  . Alcohol Use: Yes     Comment: on occ.  . Drug Use: No  . Sexual Activity: No   Other Topics Concern  . Not on file   Social History Narrative   Lives at [place]. Married 1958.   Never smoked   Alcohol minimal   Exercise predominantly walking, water aerobic   Retired Education officer, museum           reports that she has never smoked. She has never used smokeless tobacco. She reports that she drinks alcohol. She reports that she does not use illicit drugs.  Immunization History  Administered Date(s) Administered  . Influenza Whole 12/12/2011,  12/31/2012  . Pneumococcal Polysaccharide-23 03/13/2005  . Zoster 03/13/2006    Allergies  Allergen Reactions  . Clindamycin/Lincomycin     diarrhea  . Ibuprofen   . Penicillins   . Seasonal Ic [Cholestatin]     Medications: Patient's Medications  New Prescriptions   No medications on file  Previous Medications   ALBUTEROL SULFATE (PROAIR HFA IN)    Inhale into the lungs.   ALOE VERA GEL    Apply topically as needed.     ALPRAZOLAM (XANAX) 0.25 MG TABLET    Take 1 tablet (0.25 mg total) by mouth at bedtime as needed.   BACITRACIN (BACTERICIN EX)    Apply topically daily.   BIOTIN 1000 MCG TABLET    Take 1,000 mcg by mouth daily.     BUDESONIDE (PULMICORT IN)    Inhale into the lungs.   DICLOFENAC SODIUM 3 % GEL    Apply 2 to 3 times daily to painful area of the left knee   DOCUSATE SODIUM (COLACE) 100 MG CAPSULE    Take 200 mg by mouth daily as needed.     EMOLLIENT (DERMEND BRUISE FORMULA EX)    Apply topically. Apply once daily   LORATADINE (CLARITIN) 10 MG TABLET    Take 10 mg by mouth daily. As needed   MINOXIDIL (ROGAINE) 2 % EXTERNAL SOLUTION    Apply topically 2 (two) times daily.     MOMETASONE (NASONEX) 50 MCG/ACT NASAL SPRAY    1 spray by Nasal route daily.     MONTELUKAST (SINGULAIR) 10 MG TABLET    Take 10 mg by mouth. One daily for cough   NYSTATIN-TRIAMCINOLONE EX    Apply topically. Apply thin layer to rash under breast twice daily for 5 days   OMEPRAZOLE (PRILOSEC) 20 MG CAPSULE    Take 20 mg by mouth daily.     SALINE (SIMPLY SALINE) 0.9 % AERS    1 spray by Nasal route 2 (two) times daily.     SIMVASTATIN (ZOCOR) 20 MG TABLET    TAKE 1 TABLET DAILY TO LOWER CHOLESTEROL   TRAMADOL (ULTRAM) 50 MG TABLET    Take 1 tablet (50 mg total) by mouth 3 (three) times daily as needed.   TRIAMCINOLONE CREAM (KENALOG) 0.1 %    Apply 1 application topically 2 (two) times daily.  Modified Medications   No medications on file  Discontinued Medications   MOMETASONE (ELOCON)  0.1 % OINTMENT    Apply topically. Apply to eye lids in morning     Review of Systems  Constitutional: Positive for fatigue. Negative for fever, activity change, appetite change and unexpected weight change.  HENT: Positive for hearing loss.   Respiratory: Positive for shortness  of breath.   Cardiovascular: Negative for chest pain and leg swelling.  Gastrointestinal: Positive for constipation. Negative for abdominal pain, diarrhea and abdominal distention.  Endocrine:       Diabetic, diet controlled.  Genitourinary: Positive for frequency.  Musculoskeletal: Positive for arthralgias (Left knee). Negative for back pain, joint swelling and gait problem.  Skin:       C/o easy bruising. She believes related to ASA.  Neurological: Negative for dizziness, tremors, seizures, speech difficulty, weakness and numbness.  Hematological: Negative.   Psychiatric/Behavioral: Positive for sleep disturbance. The patient is nervous/anxious.     Filed Vitals:   01/12/14 1434  BP: 122/72  Pulse: 93  Temp: 97.8 F (36.6 C)  TempSrc: Oral  Resp: 10  Height: 5\' 1"  (1.549 m)  Weight: 140 lb (63.504 kg)  SpO2: 97%   Body mass index is 26.47 kg/(m^2).  Physical Exam  Constitutional: She is oriented to person, place, and time. She appears well-developed and well-nourished. No distress.  HENT:  Small collection of cerumen in the right EAC. I can see around it. Significant hearing loss and bilateral hearing aids.  Eyes: Conjunctivae are normal. Pupils are equal, round, and reactive to light.  Bilateral pupil irregularity. Mild conjunctivitis bilaterally.  Neck: Normal range of motion. Neck supple. No JVD present. No tracheal deviation present. No thyromegaly present.  Cardiovascular: Normal rate, regular rhythm, normal heart sounds and intact distal pulses.  Exam reveals no gallop.   No murmur heard. Pulmonary/Chest: Effort normal and breath sounds normal. No respiratory distress. She has no rales. She  exhibits no tenderness.  Abdominal: Soft. Bowel sounds are normal. She exhibits no distension and no mass. There is no tenderness.  Musculoskeletal: Normal range of motion. She exhibits tenderness (Maximum tenderness at lateral collateral ligament. No joint effusion.). She exhibits no edema.  Lymphadenopathy:    She has no cervical adenopathy.  Neurological: She is alert and oriented to person, place, and time. A cranial nerve deficit is present. Coordination normal.  Skin: No erythema. No pallor.  Scattered senile ecchymoses. Seborrheic keratosis on back.  Psychiatric: She has a normal mood and affect. Her behavior is normal. Judgment and thought content normal.     Labs reviewed: Nursing Home on 01/12/2014  Component Date Value Ref Range Status  . Glucose 01/06/2014 91   Final  . BUN 01/06/2014 14  4 - 21 mg/dL Final  . Creatinine 01/06/2014 0.9  .5 - 1.1 mg/dL Final  . Potassium 01/06/2014 4.5  3.4 - 5.3 mmol/L Final  . Sodium 01/06/2014 140  137 - 147 mmol/L Final  . LDl/HDL Ratio 01/06/2014 1.8   Final  . Triglycerides 01/06/2014 122  40 - 160 mg/dL Final  . Cholesterol 01/06/2014 167  0 - 200 mg/dL Final  . HDL 01/06/2014 56  35 - 70 mg/dL Final  . LDL Cholesterol 01/06/2014 99   Final  . Alkaline Phosphatase 01/06/2014 49  25 - 125 U/L Final  . ALT 01/06/2014 15  7 - 35 U/L Final  . AST 01/06/2014 17  13 - 35 U/L Final  . Bilirubin, Total 01/06/2014 0.6   Final  . Hgb A1c MFr Bld 01/06/2014 6.1* 4.0 - 6.0 % Final  . TSH 01/06/2014 5.00  0.41 - 5.90 uIU/mL Final       Assessment/Plan  1. Tremor, essential Mild and does not interfere with life. I recommended no medication at this time.  2. Hyperlipidemia controlled  3. Essential hypertension coontrolled  4. Type 2  diabetes mellitus without complication controlled  5. Edema improved  6. Hypothyroidism, unspecified hypothyroidism type Mild increase in TSH -TSH,, future  7. Bilateral conjunctivitis -see  allergist, Dr. Ishmael Holter. Patient will call.

## 2014-01-16 ENCOUNTER — Encounter: Payer: Self-pay | Admitting: Internal Medicine

## 2014-01-27 ENCOUNTER — Ambulatory Visit
Admission: RE | Admit: 2014-01-27 | Discharge: 2014-01-27 | Disposition: A | Discharge status: Home / Self Care | Payer: Medicare Other | Source: Ambulatory Visit

## 2014-01-27 DIAGNOSIS — Z1231 Encounter for screening mammogram for malignant neoplasm of breast: Secondary | ICD-10-CM | POA: Diagnosis not present

## 2014-01-29 ENCOUNTER — Other Ambulatory Visit: Payer: Self-pay | Admitting: Internal Medicine

## 2014-02-16 NOTE — Progress Notes (Signed)
I reviewed note and agree with plan.   VIKRAM R. PENUMALLI, MD  Certified in Neurology, Neurophysiology and Neuroimaging  [facility] [address], [address] [place], [place] [address] [phone]   

## 2014-02-18 DIAGNOSIS — H04223 Epiphora due to insufficient drainage, bilateral lacrimal glands: Secondary | ICD-10-CM | POA: Diagnosis not present

## 2014-02-18 DIAGNOSIS — H01004 Unspecified blepharitis left upper eyelid: Secondary | ICD-10-CM | POA: Diagnosis not present

## 2014-02-18 DIAGNOSIS — H01001 Unspecified blepharitis right upper eyelid: Secondary | ICD-10-CM | POA: Diagnosis not present

## 2014-02-18 DIAGNOSIS — H02102 Unspecified ectropion of right lower eyelid: Secondary | ICD-10-CM | POA: Diagnosis not present

## 2014-02-24 ENCOUNTER — Ambulatory Visit: Payer: Medicare Other | Admitting: Nurse Practitioner

## 2014-04-23 DIAGNOSIS — J4540 Moderate persistent asthma, uncomplicated: Secondary | ICD-10-CM | POA: Diagnosis not present

## 2014-04-23 DIAGNOSIS — J309 Allergic rhinitis, unspecified: Secondary | ICD-10-CM | POA: Diagnosis not present

## 2014-07-06 ENCOUNTER — Other Ambulatory Visit: Payer: Self-pay | Admitting: Internal Medicine

## 2014-07-06 DIAGNOSIS — H3531 Nonexudative age-related macular degeneration: Secondary | ICD-10-CM | POA: Diagnosis not present

## 2014-07-06 DIAGNOSIS — H01001 Unspecified blepharitis right upper eyelid: Secondary | ICD-10-CM | POA: Diagnosis not present

## 2014-07-06 DIAGNOSIS — H53003 Unspecified amblyopia, bilateral: Secondary | ICD-10-CM | POA: Diagnosis not present

## 2014-07-06 DIAGNOSIS — H5203 Hypermetropia, bilateral: Secondary | ICD-10-CM | POA: Diagnosis not present

## 2014-07-09 DIAGNOSIS — E039 Hypothyroidism, unspecified: Secondary | ICD-10-CM | POA: Diagnosis not present

## 2014-07-09 LAB — TSH: TSH: 6.34 u[IU]/mL — AB (ref 0.41–5.90)

## 2014-07-13 ENCOUNTER — Encounter: Payer: Self-pay | Admitting: Internal Medicine

## 2014-07-14 ENCOUNTER — Non-Acute Institutional Stay: Payer: Medicare Other | Admitting: Internal Medicine

## 2014-07-14 ENCOUNTER — Encounter: Payer: Self-pay | Admitting: Internal Medicine

## 2014-07-14 VITALS — BP 142/72 | HR 62 | Temp 97.5°F | Wt 138.0 lb

## 2014-07-14 DIAGNOSIS — G250 Essential tremor: Secondary | ICD-10-CM | POA: Diagnosis not present

## 2014-07-14 DIAGNOSIS — E890 Postprocedural hypothyroidism: Secondary | ICD-10-CM

## 2014-07-14 DIAGNOSIS — R609 Edema, unspecified: Secondary | ICD-10-CM

## 2014-07-14 DIAGNOSIS — Z23 Encounter for immunization: Secondary | ICD-10-CM | POA: Diagnosis not present

## 2014-07-14 DIAGNOSIS — E119 Type 2 diabetes mellitus without complications: Secondary | ICD-10-CM | POA: Diagnosis not present

## 2014-07-14 DIAGNOSIS — K649 Unspecified hemorrhoids: Secondary | ICD-10-CM | POA: Diagnosis not present

## 2014-07-14 DIAGNOSIS — R233 Spontaneous ecchymoses: Secondary | ICD-10-CM

## 2014-07-14 DIAGNOSIS — J4520 Mild intermittent asthma, uncomplicated: Secondary | ICD-10-CM | POA: Diagnosis not present

## 2014-07-14 NOTE — Progress Notes (Signed)
Patient ID: Christina Lawson, female   DOB: 19-Jan-1924, 81 y.o.   MRN: [id number]   Location:  [facility]  Code Status: DNR Goals of Care: Advanced Directive information Does patient have an advance directive?: Yes, Type of Advance Directive: Living will;Out of facility DNR (pink MOST or yellow form), Pre-existing out of facility DNR order (yellow form or pink MOST form): Yellow form placed in chart (order not valid for inpatient use), Does patient want to make changes to advanced directive?: No - Patient declined   Allergies  Allergen Reactions  . Clindamycin/Lincomycin     diarrhea  . Ibuprofen   . Penicillins   . Seasonal Ic [Cholestatin]     Chief Complaint  Patient presents with  . Medical Management of Chronic Issues    blood pressure, blood sugar, thyroid  . Eye Drainage    saw eye doctor 4/28, has dropps, new Rx glasses  . Hearing Loss    got new hearing aids end of Dec both ears    HPI: Patient is a 81 y.o. white female seen in the office today for med mgt of chronic diseases.  She follows with ophthalmology for her eye problems--last saw 4/28 and was started on drops.  Got a new eyeglass Rx.    Has hearing loss and got new hearing aides in December that seem to be working quite well.   When she was young, she had thyroid surgery and temporarily needed meds afterward, but has not been on any synthroid for many many years.  TSH was in 6 range this time.    Does wake up at night to urinate, but has no difficulty falling back to sleep.  Says eating a larger lunch is helping her sleep better and smaller supper, as well as going to the pool 3x per week and walking.  She does take naps which she tries to keep to 30 mins, but sometimes doesn't hear her alarm and sleeps for 2 hrs, then can't sleep at night.  Denies urinary frequency, urgency, dysuria.  Has BMs daily, did have loose stool from too many raspberries so hemorrhoids are flared up--is using preparation H with  benefit.  Edema not bothersome lately--has typically affected left leg more.    Breathes well when moves slowly which she usually does, but if she goes faster, she wheezes.  Follows with Dr. Willa Rough 2x per year for this.    Sees neurology for essential tremor--gets worse when anxious and uses xanax for this, but rarely takes it.  Says a bottle of 30 lasted 2 years, though she uses it more now than she did at that point.  Discussed risks of too much of it and she expressed understanding.   Review of Systems:  Review of Systems  Constitutional: Negative for fever, chills, weight loss and malaise/fatigue.  HENT: Positive for hearing loss. Negative for congestion.   Eyes: Positive for discharge and redness. Negative for blurred vision.  Respiratory: Positive for shortness of breath and wheezing. Negative for cough.   Cardiovascular: Positive for leg swelling. Negative for chest pain.  Gastrointestinal: Positive for diarrhea. Negative for abdominal pain, constipation, blood in stool and melena.  Genitourinary: Negative for dysuria, urgency, frequency and hematuria.  Musculoskeletal: Positive for joint pain. Negative for falls.  Skin: Positive for itching and rash.  Neurological: Negative for dizziness, loss of consciousness, weakness and headaches.  Psychiatric/Behavioral: Negative for depression and memory loss. The patient is nervous/anxious. The patient does not have insomnia.  Past Medical History  Diagnosis Date  . Unspecified essential hypertension   . Hyperlipidemia   . Skin cancer 2005    nose  . Irregular heart rhythm   . Shingles 2004  . Delivery normal 1960  . Breech delivery 1962  . Macular degeneration 2012  . Tremor 2006    familiar.  dx by Dr. Hedy Camara  . Torn meniscus 2006    right knee  . Sensorineural hearing loss, bilateral 2007  . Asthma   . Osteoporosis   . Allergic rhinitis     pollen and grass  . Urinary frequency   . Unspecified hypothyroidism   .  Blepharitis   . Movement disorder     Tremor    Past Surgical History  Procedure Laterality Date  . Tonsillectomy  1931  . Thyroidectomy  1955  . Cataract extraction  1974    Left, IOL implants  . Abdominal hysterectomy  1977  . Dilation and curettage of uterus  1977  . Hiatal hernia repair  1977  . Cataract extraction Right 1979    IOL implants  . Gallbladder surgery  1981  . Cosmetic surgery  1995    eye  . Skin cancer excision  2005    nose  . Cholecystectomy  1981  . Cystocele repair  1993    Social History:   reports that she has never smoked. She has never used smokeless tobacco. She reports that she drinks alcohol. She reports that she does not use illicit drugs.  Family History  Problem Relation Age of Onset  . Emphysema Father   . Allergies Father   . Asthma Father   . Heart disease Maternal Grandfather   . Brain cancer Mother   . Cancer Mother     Brain  . Hypertension Mother   . Cancer Sister     Cervical  . Endometrial cancer Sister   . Cancer Sister     Breast, metastasized to bone  . Stroke      Medications: Patient's Medications  New Prescriptions   No medications on file  Previous Medications   ALBUTEROL SULFATE (PROAIR HFA IN)    Inhale into the lungs.   ALOE VERA GEL    Apply topically as needed.     ALPRAZOLAM (XANAX) 0.25 MG TABLET    Take 1 tablet (0.25 mg total) by mouth at bedtime as needed.   BIOTIN 1000 MCG TABLET    Take 1,000 mcg by mouth daily.     DICLOFENAC SODIUM 3 % GEL    Apply 2 to 3 times daily to painful area of the left knee   DOCUSATE SODIUM (COLACE) 100 MG CAPSULE    Take 200 mg by mouth daily as needed.     EMOLLIENT (DERMEND BRUISE FORMULA EX)    Apply topically. Apply once daily   LORATADINE (CLARITIN) 10 MG TABLET    Take 10 mg by mouth daily. As needed   MINOXIDIL (ROGAINE) 2 % EXTERNAL SOLUTION    Apply topically 2 (two) times daily.     MOMETASONE (NASONEX) 50 MCG/ACT NASAL SPRAY    1 spray by Nasal route daily.      MONTELUKAST (SINGULAIR) 10 MG TABLET    Take 10 mg by mouth. One daily for cough   MULTIPLE VITAMINS-MINERALS (PRESERVISION AREDS PO)    Take by mouth. Take one tablet twice daily   NYSTATIN-TRIAMCINOLONE EX    Apply topically. Apply thin layer to rash under breast twice daily for  5 days, as needed   OMEPRAZOLE (PRILOSEC) 20 MG CAPSULE    Take 20 mg by mouth daily.     SALINE (SIMPLY SALINE) 0.9 % AERS    1 spray by Nasal route 2 (two) times daily.     SIMVASTATIN (ZOCOR) 20 MG TABLET    TAKE 1 TABLET DAILY TO LOWER CHOLESTEROL   TRAMADOL (ULTRAM) 50 MG TABLET    Take 1 tablet (50 mg total) by mouth 3 (three) times daily as needed.   TRIAMCINOLONE CREAM (KENALOG) 0.1 %    Apply 1 application topically 2 (two) times daily.  Modified Medications   No medications on file  Discontinued Medications   BACITRACIN (BACTERICIN EX)    Apply topically daily.   BUDESONIDE (PULMICORT IN)    Inhale into the lungs.     Physical Exam: Filed Vitals:   07/14/14 1530  BP: 142/72  Pulse: 62  Temp: 97.5 F (36.4 C)  TempSrc: Oral  Weight: 138 lb (62.596 kg)  Physical Exam  Constitutional: She is oriented to person, place, and time. She appears well-developed and well-nourished. No distress.  Eyes:  Glasses, some erythema beneath eyes  Cardiovascular: Normal rate, regular rhythm, normal heart sounds and intact distal pulses.   Pulmonary/Chest: Effort normal and breath sounds normal. She has no wheezes.  Abdominal: Soft. Bowel sounds are normal. She exhibits no distension and no mass. There is no tenderness.  Musculoskeletal: Normal range of motion.  Neurological: She is alert and oriented to person, place, and time.  Skin: Skin is warm and dry.  Ecchymotic place on posterior upper arm on left with hematoma palpable beneath  Psychiatric: She has a normal mood and affect.    Labs reviewed: Basic Metabolic Panel:  Recent Labs  30/86/57 07/09/14  NA 140  --   K 4.5  --   BUN 14  --     CREATININE 0.9  --   TSH 5.00 6.34*   Liver Function Tests:  Recent Labs  01/06/14  AST 17  ALT 15  ALKPHOS 49   No results for input(s): LIPASE, AMYLASE in the last 8760 hours. No results for input(s): AMMONIA in the last 8760 hours. CBC: No results for input(s): WBC, NEUTROABS, HGB, HCT, MCV, PLT in the last 8760 hours. Lipid Panel:  Recent Labs  01/06/14  CHOL 167  HDL 56  LDLCALC 99  TRIG 122   Lab Results  Component Value Date   HGBA1C 6.1* 01/06/2014    Assessment/Plan 1. Postoperative hypothyroidism -not on meds since she was young -tsh now 6, will f/u before next visit--would only treat if tsh exceeds 10 or she is developing clinical symptoms of hypothyroid like constipation, cold intolerance, fatigue, bradycardia, etc.  2. Tremor, essential -uses xanax low dose due to worsening with anxiety; has no signs of PD which she worries about  3. Type 2 diabetes mellitus without complication -f/u hba1c before next visit and needs foot exam -keep ophtho f/u -cont zocor and diet and exercise  4. Hemorrhoids, unspecified hemorrhoid type -when she has more loose stools related to what she eats--using preparation H with benefit  5. Edema -of left leg moreso than right, chronic, actually improved  6. Asthma, intrinsic, mild intermittent, uncomplicated -keep regular appt with Dr. Willa Rough, cont proair prn, claritin, singulair, nasonex  7. Senile ecchymosis -worst on left upper arm, explained that this is due to thinning skin and loss of collagen with age, cannot easily prevent-is using an emollient cream, not even on  anticoagulants  8. Need for vaccination with 13-polyvalent pneumococcal conjugate vaccine - Pneumococcal conjugate vaccine 13-valent--prevnar given   Labs/tests ordered: tsh, hba1c Next appt:  4 mos  Abel Hageman L. Melody Savidge, D.O. Geriatrics [facility] [address], [place] [address] Cell Phone (Mon-Fri 8am-5pm):   [phone] On Call:  [phone] & follow prompts after 5pm & weekends Office Phone:  [phone] Office Fax:  [phone]

## 2014-07-21 DIAGNOSIS — D485 Neoplasm of uncertain behavior of skin: Secondary | ICD-10-CM | POA: Diagnosis not present

## 2014-07-21 DIAGNOSIS — L820 Inflamed seborrheic keratosis: Secondary | ICD-10-CM | POA: Diagnosis not present

## 2014-07-21 DIAGNOSIS — L821 Other seborrheic keratosis: Secondary | ICD-10-CM | POA: Diagnosis not present

## 2014-07-21 DIAGNOSIS — L400 Psoriasis vulgaris: Secondary | ICD-10-CM | POA: Diagnosis not present

## 2014-07-21 DIAGNOSIS — Z85828 Personal history of other malignant neoplasm of skin: Secondary | ICD-10-CM | POA: Diagnosis not present

## 2014-07-21 DIAGNOSIS — L814 Other melanin hyperpigmentation: Secondary | ICD-10-CM | POA: Diagnosis not present

## 2014-07-21 DIAGNOSIS — D1800 Hemangioma unspecified site: Secondary | ICD-10-CM | POA: Diagnosis not present

## 2014-07-22 DIAGNOSIS — L814 Other melanin hyperpigmentation: Secondary | ICD-10-CM | POA: Diagnosis not present

## 2014-07-30 ENCOUNTER — Other Ambulatory Visit: Payer: Self-pay | Admitting: *Deleted

## 2014-07-30 DIAGNOSIS — M25572 Pain in left ankle and joints of left foot: Secondary | ICD-10-CM

## 2014-07-30 MED ORDER — TRAMADOL HCL 50 MG PO TABS: 50.0000 mg | ORAL_TABLET | Freq: Three times a day (TID) | ORAL | Status: DC | PRN

## 2014-07-30 NOTE — Telephone Encounter (Signed)
Patient Requested to be faxed to pharmacy. 

## 2014-09-01 ENCOUNTER — Encounter: Payer: Self-pay | Admitting: Nurse Practitioner

## 2014-09-01 ENCOUNTER — Ambulatory Visit (INDEPENDENT_AMBULATORY_CARE_PROVIDER_SITE_OTHER): Payer: Medicare Other | Admitting: Nurse Practitioner

## 2014-09-01 VITALS — BP 130/88 | HR 78 | Temp 97.7°F | Resp 20 | Ht 61.0 in | Wt 137.2 lb

## 2014-09-01 DIAGNOSIS — M8580 Other specified disorders of bone density and structure, unspecified site: Secondary | ICD-10-CM

## 2014-09-01 DIAGNOSIS — M25512 Pain in left shoulder: Secondary | ICD-10-CM | POA: Diagnosis not present

## 2014-09-01 NOTE — Progress Notes (Signed)
Patient ID: Christina Lawson, female   DOB: Apr 14, 1923, 81 y.o.   MRN: [id number]    PCP: Hollace Kinnier, DO  Allergies  Allergen Reactions  . Clindamycin/Lincomycin     diarrhea  . Ibuprofen   . Penicillins   . Seasonal Ic [Cholestatin]     Chief Complaint  Patient presents with  . Acute Visit    neck and shoulder pain     HPI: Patient is a 81 y.o. female seen in the office today for pain in her top left arm and shoulder going into her back.  Aching pain that does not go away. Has been going on for 2 weeks. Not getting better  Has used hot compresses and ice which has helped Going to bed helps and pain is better in the morning after she has gone to bed.  Tramadol and tylenol does not help Hurts midway up her arm, at the shoulder joint and into her shoulder blade  No injury noted No numnbess or tingling  Strength and ROM not effective.   Review of Systems:  Review of Systems  Constitutional: Negative for activity change, appetite change, fatigue and unexpected weight change.  Respiratory: Negative for shortness of breath.   Cardiovascular: Negative for chest pain.  Musculoskeletal: Positive for myalgias and arthralgias. Negative for joint swelling and gait problem.  Skin: Negative for color change and wound.    Past Medical History  Diagnosis Date  . Unspecified essential hypertension   . Hyperlipidemia   . Skin cancer 2005    nose  . Irregular heart rhythm   . Shingles 2004  . Delivery normal 1960  . Breech delivery 1962  . Macular degeneration 2012  . Tremor 2006    familiar.  dx by Dr. Quillian Quince  . Torn meniscus 2006    right knee  . Sensorineural hearing loss, bilateral 2007  . Asthma   . Osteoporosis   . Allergic rhinitis     pollen and grass  . Urinary frequency   . Unspecified hypothyroidism   . Blepharitis   . Movement disorder     Tremor   Past Surgical History  Procedure Laterality Date  . Tonsillectomy  1931  . Thyroidectomy  1955  .  Cataract extraction  1974    Left, IOL implants  . Abdominal hysterectomy  1977  . Dilation and curettage of uterus  1977  . Hiatal hernia repair  1977  . Cataract extraction Right 1979    IOL implants  . Gallbladder surgery  1981  . Cosmetic surgery  1995    eye  . Skin cancer excision  2005    nose  . Cholecystectomy  1981  . Cystocele repair  1993   Social History:   reports that she has never smoked. She has never used smokeless tobacco. She reports that she drinks alcohol. She reports that she does not use illicit drugs.  Family History  Problem Relation Age of Onset  . Emphysema Father   . Allergies Father   . Asthma Father   . Heart disease Maternal Grandfather   . Brain cancer Mother   . Cancer Mother     Brain  . Hypertension Mother   . Cancer Sister     Cervical  . Endometrial cancer Sister   . Cancer Sister     Breast, metastasized to bone  . Stroke      Medications: Patient's Medications  New Prescriptions   No medications on file  Previous Medications  ALBUTEROL SULFATE (PROAIR HFA IN)    Inhale into the lungs.   ALOE VERA GEL    Apply topically as needed.     ALPRAZOLAM (XANAX) 0.25 MG TABLET    Take 1 tablet (0.25 mg total) by mouth at bedtime as needed.   BIOTIN 1000 MCG TABLET    Take 1,000 mcg by mouth daily.     DICLOFENAC SODIUM 3 % GEL    Apply 2 to 3 times daily to painful area of the left knee   DOCUSATE SODIUM (COLACE) 100 MG CAPSULE    Take 200 mg by mouth daily as needed.     EMOLLIENT (DERMEND BRUISE FORMULA EX)    Apply topically. Apply once daily   LORATADINE (CLARITIN) 10 MG TABLET    Take 10 mg by mouth daily. As needed   MINOXIDIL (ROGAINE) 2 % EXTERNAL SOLUTION    Apply topically 2 (two) times daily.     MOMETASONE (NASONEX) 50 MCG/ACT NASAL SPRAY    1 spray by Nasal route daily.     MONTELUKAST (SINGULAIR) 10 MG TABLET    Take 10 mg by mouth. One daily for cough   NYSTATIN-TRIAMCINOLONE EX    Apply topically. Apply thin layer to  rash under breast twice daily for 5 days, as needed   OMEPRAZOLE (PRILOSEC) 20 MG CAPSULE    Take 20 mg by mouth daily.     SALINE (SIMPLY SALINE) 0.9 % AERS    1 spray by Nasal route 2 (two) times daily.     SIMVASTATIN (ZOCOR) 20 MG TABLET    TAKE 1 TABLET DAILY TO LOWER CHOLESTEROL   TRAMADOL (ULTRAM) 50 MG TABLET    Take 1 tablet (50 mg total) by mouth 3 (three) times daily as needed.   TRIAMCINOLONE CREAM (KENALOG) 0.1 %    Apply 1 application topically 2 (two) times daily.  Modified Medications   No medications on file  Discontinued Medications   MULTIPLE VITAMINS-MINERALS (PRESERVISION AREDS PO)    Take by mouth. Take one tablet twice daily     Physical Exam:  Filed Vitals:   09/01/14 1453  BP: 130/88  Pulse: 78  Temp: 97.7 F (36.5 C)  TempSrc: Oral  Resp: 20  Height: 5\' 1"  (1.549 m)  Weight: 137 lb 3.2 oz (62.234 kg)  SpO2: 95%    Physical Exam  Constitutional: She appears well-developed and well-nourished. No distress.  Musculoskeletal: Normal range of motion. She exhibits no edema.       Left shoulder: She exhibits tenderness. She exhibits normal range of motion and no swelling.  Mild tenderness noted to midway down upper arm lateral aspect. No pain with ROM, no swelling  Or redness noted. slight crepitus noted.   Skin: She is not diaphoretic.    Labs reviewed: Basic Metabolic Panel:  Recent Labs  01/06/14 07/09/14  NA 140  --   K 4.5  --   BUN 14  --   CREATININE 0.9  --   TSH 5.00 6.34*   Liver Function Tests:  Recent Labs  01/06/14  AST 17  ALT 15  ALKPHOS 49   No results for input(s): LIPASE, AMYLASE in the last 8760 hours. No results for input(s): AMMONIA in the last 8760 hours. CBC: No results for input(s): WBC, NEUTROABS, HGB, HCT, MCV, PLT in the last 8760 hours. Lipid Panel:  Recent Labs  01/06/14  CHOL 167  HDL 56  LDLCALC 99  TRIG 122   TSH:  Recent Labs  01/06/14 07/09/14  TSH 5.00 6.34*   A1C: Lab Results  Component  Value Date   HGBA1C 6.1* 01/06/2014     Assessment/Plan  1. Osteopenia Requesting follow up bone density, hx of OP, has been taken off supplements in the past due to dietary compliance  - DG Bone Density; Future  2. Pain in joint, shoulder region, left -discussed further evaluation of shoulder, (with xray, ultrasound or ortho evaluation)  -pt would not like to have imagining at this time -allergy to ibuprofen unknown what the reaction is -using  Diclofenac to knee as needed, okay to apply to shoulder daily for 1 week  -to follow up in 1 week with Dr Mariea Clonts to re-assess     [place] K. Harle Battiest  [facility] [phone] 8 am - 5 pm) [phone] (after hours)

## 2014-09-01 NOTE — Patient Instructions (Signed)
Use heat and ice 3 times daily for the next week  May use diclofenac gel to shoulder daily after heat and ice applied   Follow up with Dr Mariea Clonts in 1 week

## 2014-09-09 ENCOUNTER — Ambulatory Visit
Admission: RE | Admit: 2014-09-09 | Discharge: 2014-09-09 | Disposition: A | Discharge status: Home / Self Care | Payer: Medicare Other | Source: Ambulatory Visit | Attending: Internal Medicine | Admitting: Internal Medicine

## 2014-09-09 ENCOUNTER — Encounter: Payer: Self-pay | Admitting: Internal Medicine

## 2014-09-09 ENCOUNTER — Non-Acute Institutional Stay: Payer: Medicare Other | Admitting: Internal Medicine

## 2014-09-09 ENCOUNTER — Encounter: Payer: Medicare Other | Admitting: Internal Medicine

## 2014-09-09 VITALS — BP 134/82 | HR 68 | Temp 97.5°F | Wt 134.0 lb

## 2014-09-09 DIAGNOSIS — M5032 Other cervical disc degeneration, mid-cervical region: Secondary | ICD-10-CM | POA: Diagnosis not present

## 2014-09-09 DIAGNOSIS — M8580 Other specified disorders of bone density and structure, unspecified site: Secondary | ICD-10-CM

## 2014-09-09 DIAGNOSIS — M47812 Spondylosis without myelopathy or radiculopathy, cervical region: Secondary | ICD-10-CM | POA: Diagnosis not present

## 2014-09-09 DIAGNOSIS — M19012 Primary osteoarthritis, left shoulder: Secondary | ICD-10-CM | POA: Diagnosis not present

## 2014-09-09 DIAGNOSIS — G8929 Other chronic pain: Secondary | ICD-10-CM

## 2014-09-09 DIAGNOSIS — M25512 Pain in left shoulder: Secondary | ICD-10-CM

## 2014-09-09 DIAGNOSIS — M542 Cervicalgia: Secondary | ICD-10-CM

## 2014-09-09 DIAGNOSIS — M5031 Other cervical disc degeneration,  high cervical region: Secondary | ICD-10-CM | POA: Diagnosis not present

## 2014-09-09 NOTE — Progress Notes (Signed)
Patient ID: Christina Lawson, female   DOB: October 02, 1923, 81 y.o.   MRN: [id number]   Location:  [facility]  Code Status: DNR Goals of Care: Advanced Directive information Does patient have an advance directive?: Yes, Type of Advance Directive: Out of facility DNR (pink MOST or yellow form);Living will;Healthcare Power of Attorney, Pre-existing out of facility DNR order (yellow form or pink MOST form): Yellow form placed in chart (order not valid for inpatient use), Does patient want to make changes to advanced directive?: No - Patient declined   Chief Complaint  Patient presents with  . Medical Management of Chronic Issues    one week follow-up left shoulder  . Neck Pain    now has pain back of neck    HPI: Patient is a 81 y.o. white female seen in the office today for f/u on her left shoulder and left neck pain.    Had bruise on left arm a couple of mos ago that would not go away Saw derm and had biopsy which was unremarkable Had been doing water aerobics and could not lift her arm Has not had bone density scan in 4 years (2014), but they had been good Shanda Bumps didn't think it was a bone problem but suggested she see me---used warm compresses alternating with ice and took advil Pain is pretty much gone, but has always been better in the mornings Now back of her neck is bothering her and the back of her arm is painful at times Stopped eat, but continued ice for her neck. Has tried voltaren gel  Uses alprazolam for her tremor and asks if this would help.  Walks outdoors also.    Does drink milk and eats yogurt.  Stopped her calcium.    Review of Systems:  Review of Systems  Constitutional: Negative for fever and chills.  HENT:       Pain goes up back of her left head  Respiratory: Negative for shortness of breath.   Cardiovascular: Negative for chest pain and palpitations.  Gastrointestinal: Negative for abdominal pain.    Genitourinary: Negative for dysuria.  Musculoskeletal: Positive for myalgias, joint pain and neck pain. Negative for falls.  Neurological: Positive for tremors and headaches.  Psychiatric/Behavioral: Negative for memory loss.    Past Medical History  Diagnosis Date  . Unspecified essential hypertension   . Hyperlipidemia   . Skin cancer 2005    nose  . Irregular heart rhythm   . Shingles 2004  . Delivery normal 1960  . Breech delivery 1962  . Macular degeneration 2012  . Tremor 2006    familiar.  dx by Dr. Hedy Camara  . Torn meniscus 2006    right knee  . Sensorineural hearing loss, bilateral 2007  . Asthma   . Osteoporosis   . Allergic rhinitis     pollen and grass  . Urinary frequency   . Unspecified hypothyroidism   . Blepharitis   . Movement disorder     Tremor    Past Surgical History  Procedure Laterality Date  . Tonsillectomy  1931  . Thyroidectomy  1955  . Cataract extraction  1974    Left, IOL implants  . Abdominal hysterectomy  1977  . Dilation and curettage of uterus  1977  . Hiatal hernia repair  1977  . Cataract extraction Right 1979    IOL implants  . Gallbladder surgery  1981  . Cosmetic surgery  1995    eye  .  Skin cancer excision  2005    nose  . Cholecystectomy  1981  . Cystocele repair  1993    Allergies  Allergen Reactions  . Clindamycin/Lincomycin     diarrhea  . Ibuprofen   . Penicillins   . Seasonal Ic [Cholestatin]    Medications: Patient's Medications  New Prescriptions   No medications on file  Previous Medications   ALBUTEROL SULFATE (PROAIR HFA IN)    Inhale into the lungs.   ALOE VERA GEL    Apply topically as needed.     ALPRAZOLAM (XANAX) 0.25 MG TABLET    Take 1 tablet (0.25 mg total) by mouth at bedtime as needed.   BIOTIN 1000 MCG TABLET    Take 1,000 mcg by mouth daily.     DICLOFENAC SODIUM 3 % GEL    Apply 2 to 3 times daily to painful area of the left knee   DOCUSATE SODIUM (COLACE) 100 MG CAPSULE    Take  200 mg by mouth daily as needed.     EMOLLIENT (DERMEND BRUISE FORMULA EX)    Apply topically. Apply once daily   LORATADINE (CLARITIN) 10 MG TABLET    Take 10 mg by mouth daily. As needed   MINOXIDIL (ROGAINE) 2 % EXTERNAL SOLUTION    Apply topically 2 (two) times daily.     MOMETASONE (NASONEX) 50 MCG/ACT NASAL SPRAY    1 spray by Nasal route daily.     MONTELUKAST (SINGULAIR) 10 MG TABLET    Take 10 mg by mouth. One daily for cough   NYSTATIN-TRIAMCINOLONE EX    Apply topically. Apply thin layer to rash under breast twice daily for 5 days, as needed   OMEPRAZOLE (PRILOSEC) 20 MG CAPSULE    Take 20 mg by mouth daily.     SALINE (SIMPLY SALINE) 0.9 % AERS    1 spray by Nasal route 2 (two) times daily.     SIMVASTATIN (ZOCOR) 20 MG TABLET    TAKE 1 TABLET DAILY TO LOWER CHOLESTEROL   TRAMADOL (ULTRAM) 50 MG TABLET    Take 1 tablet (50 mg total) by mouth 3 (three) times daily as needed.   TRIAMCINOLONE CREAM (KENALOG) 0.1 %    Apply 1 application topically 2 (two) times daily.  Modified Medications   No medications on file  Discontinued Medications   No medications on file    Physical Exam: Filed Vitals:   09/09/14 0830  BP: 134/82  Pulse: 68  Temp: 97.5 F (36.4 C)  TempSrc: Oral  Weight: 134 lb (60.782 kg)   Physical Exam  Constitutional: She is oriented to person, place, and time. She appears well-developed and well-nourished. No distress.  Cardiovascular: Normal heart sounds.   Pulmonary/Chest: Effort normal and breath sounds normal.  Musculoskeletal:  Tenderness of posterior left arm; tight, tender left trapezius region; decreased rotation and sidebending left of her cervical spine; also tenderness at base of left occiput  Neurological: She is alert and oriented to person, place, and time.  Tremor of hands  Skin: Skin is warm and dry.  Small ecchymoses still present on posterior upper arm  Psychiatric: She has a normal mood and affect.    Labs reviewed: Basic Metabolic  Panel:  Recent Labs  01/06/14 07/09/14  NA 140  --   K 4.5  --   BUN 14  --   CREATININE 0.9  --   TSH 5.00 6.34*   Liver Function Tests:  Recent Labs  01/06/14  AST  17  ALT 15  ALKPHOS 49   No results for input(s): LIPASE, AMYLASE in the last 8760 hours. No results for input(s): AMMONIA in the last 8760 hours. CBC: No results for input(s): WBC, NEUTROABS, HGB, HCT, MCV, PLT in the last 8760 hours. Lipid Panel:  Recent Labs  01/06/14  CHOL 167  HDL 56  LDLCALC 99  TRIG 122   Lab Results  Component Value Date   HGBA1C 6.1* 01/06/2014     Assessment/Plan 1. 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 Simple muscle spasms from malalignment of her cspine - obtain xrays first -DG Shoulder Left; Future - DG Cervical Spine Complete; Future -cont pain mgt with advil and ice for neck, may use heat on her arm if that feels good -discussed that PT/OT or a chiropractor would be the next options and she seemed to favor therapy--discussed that interventions like ultrasound, massage, tens unit could be used by OT to help her pain -also discussed not overusing antiinflammatories that could cause gi bleeding or kidney problems and she understands this -cont regular water aerobics and walking routine  2. Chronic left shoulder pain - has now been going on for a few months--see above  - DG Shoulder Left; Future - DG Cervical Spine Complete; Future  3. Osteopenia - pt is going to look for the copy of the heel scan she believes she had here in 2014 b/c no bone density is in the system for that year -reviewed the 2012 results which showed t score -2.4 in her hip which is borderline osteoporosis -discussed that we will probably need to do the bone density again--says they are to call her about scheduling this  Labs/tests ordered:   Orders Placed This Encounter  Procedures  . DG Shoulder  Left    Standing Status: Future     Number of Occurrences:      Standing Expiration Date: 11/09/2015    Order Specific Question:  Reason for Exam (SYMPTOM  OR DIAGNOSIS REQUIRED)    Answer:  left neck pain and left shoulder pain; decreased sidebending and rotation left    Order Specific Question:  Preferred imaging location?    Answer:  [facility]  . DG Cervical Spine Complete    Standing Status: Future     Number of Occurrences:      Standing Expiration Date: 11/09/2015    Order Specific Question:  Reason for Exam (SYMPTOM  OR DIAGNOSIS REQUIRED)    Answer:  left sided neck pain and left arm pain    Order Specific Question:  Preferred imaging location?    Answer:  [facility]    Next appt:  Keep visit as scheduled with labs before  Rhapsody Wolven L. Karee Forge, D.O. Geriatrics [facility] [address], [place] [address] Cell Phone (Mon-Fri 8am-5pm):  [phone] On Call:  [phone] & follow prompts after 5pm & weekends Office Phone:  [phone] Office Fax:  [phone]

## 2014-09-09 NOTE — Progress Notes (Signed)
Patient ID: Christina Lawson, female   DOB: 30-Oct-1923, 81 y.o.   MRN: [id number] Please note that this was done at [facility] NOT the [facility] office.  I used the incorrect template.

## 2014-09-16 DIAGNOSIS — M6281 Muscle weakness (generalized): Secondary | ICD-10-CM | POA: Diagnosis not present

## 2014-09-16 DIAGNOSIS — M25512 Pain in left shoulder: Secondary | ICD-10-CM | POA: Diagnosis not present

## 2014-09-18 DIAGNOSIS — M6281 Muscle weakness (generalized): Secondary | ICD-10-CM | POA: Diagnosis not present

## 2014-09-18 DIAGNOSIS — M25512 Pain in left shoulder: Secondary | ICD-10-CM | POA: Diagnosis not present

## 2014-09-21 DIAGNOSIS — M6281 Muscle weakness (generalized): Secondary | ICD-10-CM | POA: Diagnosis not present

## 2014-09-21 DIAGNOSIS — M25512 Pain in left shoulder: Secondary | ICD-10-CM | POA: Diagnosis not present

## 2014-09-23 DIAGNOSIS — M25512 Pain in left shoulder: Secondary | ICD-10-CM | POA: Diagnosis not present

## 2014-09-23 DIAGNOSIS — M6281 Muscle weakness (generalized): Secondary | ICD-10-CM | POA: Diagnosis not present

## 2014-09-25 DIAGNOSIS — M6281 Muscle weakness (generalized): Secondary | ICD-10-CM | POA: Diagnosis not present

## 2014-09-25 DIAGNOSIS — M25512 Pain in left shoulder: Secondary | ICD-10-CM | POA: Diagnosis not present

## 2014-09-28 DIAGNOSIS — M6281 Muscle weakness (generalized): Secondary | ICD-10-CM | POA: Diagnosis not present

## 2014-09-28 DIAGNOSIS — M25512 Pain in left shoulder: Secondary | ICD-10-CM | POA: Diagnosis not present

## 2014-09-30 DIAGNOSIS — M25512 Pain in left shoulder: Secondary | ICD-10-CM | POA: Diagnosis not present

## 2014-09-30 DIAGNOSIS — M6281 Muscle weakness (generalized): Secondary | ICD-10-CM | POA: Diagnosis not present

## 2014-10-02 DIAGNOSIS — M6281 Muscle weakness (generalized): Secondary | ICD-10-CM | POA: Diagnosis not present

## 2014-10-02 DIAGNOSIS — M25512 Pain in left shoulder: Secondary | ICD-10-CM | POA: Diagnosis not present

## 2014-10-05 ENCOUNTER — Other Ambulatory Visit: Payer: Self-pay | Admitting: Internal Medicine

## 2014-10-05 DIAGNOSIS — M25512 Pain in left shoulder: Secondary | ICD-10-CM | POA: Diagnosis not present

## 2014-10-05 DIAGNOSIS — M6281 Muscle weakness (generalized): Secondary | ICD-10-CM | POA: Diagnosis not present

## 2014-10-08 DIAGNOSIS — M6281 Muscle weakness (generalized): Secondary | ICD-10-CM | POA: Diagnosis not present

## 2014-10-08 DIAGNOSIS — M25512 Pain in left shoulder: Secondary | ICD-10-CM | POA: Diagnosis not present

## 2014-10-09 DIAGNOSIS — M25512 Pain in left shoulder: Secondary | ICD-10-CM | POA: Diagnosis not present

## 2014-10-09 DIAGNOSIS — M6281 Muscle weakness (generalized): Secondary | ICD-10-CM | POA: Diagnosis not present

## 2014-10-12 DIAGNOSIS — M25512 Pain in left shoulder: Secondary | ICD-10-CM | POA: Diagnosis not present

## 2014-10-12 DIAGNOSIS — M6281 Muscle weakness (generalized): Secondary | ICD-10-CM | POA: Diagnosis not present

## 2014-10-29 DIAGNOSIS — J4530 Mild persistent asthma, uncomplicated: Secondary | ICD-10-CM | POA: Diagnosis not present

## 2014-10-29 DIAGNOSIS — J309 Allergic rhinitis, unspecified: Secondary | ICD-10-CM | POA: Diagnosis not present

## 2014-11-17 DIAGNOSIS — E785 Hyperlipidemia, unspecified: Secondary | ICD-10-CM | POA: Diagnosis not present

## 2014-11-17 DIAGNOSIS — E039 Hypothyroidism, unspecified: Secondary | ICD-10-CM | POA: Diagnosis not present

## 2014-11-17 DIAGNOSIS — I10 Essential (primary) hypertension: Secondary | ICD-10-CM | POA: Diagnosis not present

## 2014-11-17 DIAGNOSIS — E119 Type 2 diabetes mellitus without complications: Secondary | ICD-10-CM | POA: Diagnosis not present

## 2014-11-17 LAB — LIPID PANEL
Cholesterol: 171 mg/dL (ref 0–200)
HDL: 53 mg/dL (ref 35–70)
LDL Cholesterol: 103 mg/dL
LDl/HDL Ratio: 1.9
TRIGLYCERIDES: 95 mg/dL (ref 40–160)

## 2014-11-17 LAB — HEMOGLOBIN A1C: Hgb A1c MFr Bld: 6.2 % — AB (ref 4.0–6.0)

## 2014-11-17 LAB — BASIC METABOLIC PANEL
BUN: 14 mg/dL (ref 4–21)
Creatinine: 0.8 mg/dL (ref 0.5–1.1)
Glucose: 97 mg/dL
POTASSIUM: 4.2 mmol/L (ref 3.4–5.3)
Sodium: 142 mmol/L (ref 137–147)

## 2014-11-17 LAB — TSH: TSH: 3.64 u[IU]/mL (ref 0.41–5.90)

## 2014-11-18 ENCOUNTER — Other Ambulatory Visit: Payer: Self-pay

## 2014-11-24 ENCOUNTER — Encounter: Payer: Self-pay | Admitting: Internal Medicine

## 2014-11-25 ENCOUNTER — Non-Acute Institutional Stay: Payer: Medicare Other | Admitting: Internal Medicine

## 2014-11-25 ENCOUNTER — Encounter: Payer: Self-pay | Admitting: Internal Medicine

## 2014-11-25 VITALS — BP 138/84 | HR 84 | Temp 97.4°F | Wt 135.0 lb

## 2014-11-25 DIAGNOSIS — E890 Postprocedural hypothyroidism: Secondary | ICD-10-CM | POA: Diagnosis not present

## 2014-11-25 DIAGNOSIS — R350 Frequency of micturition: Secondary | ICD-10-CM | POA: Diagnosis not present

## 2014-11-25 DIAGNOSIS — E119 Type 2 diabetes mellitus without complications: Secondary | ICD-10-CM

## 2014-11-25 DIAGNOSIS — B079 Viral wart, unspecified: Secondary | ICD-10-CM | POA: Diagnosis not present

## 2014-11-25 DIAGNOSIS — E785 Hyperlipidemia, unspecified: Secondary | ICD-10-CM

## 2014-11-25 DIAGNOSIS — Z66 Do not resuscitate: Secondary | ICD-10-CM | POA: Diagnosis not present

## 2014-11-25 DIAGNOSIS — G250 Essential tremor: Secondary | ICD-10-CM

## 2014-11-25 DIAGNOSIS — R2689 Other abnormalities of gait and mobility: Secondary | ICD-10-CM

## 2014-11-25 DIAGNOSIS — R29818 Other symptoms and signs involving the nervous system: Secondary | ICD-10-CM

## 2014-11-25 DIAGNOSIS — I10 Essential (primary) hypertension: Secondary | ICD-10-CM

## 2014-11-25 DIAGNOSIS — B078 Other viral warts: Secondary | ICD-10-CM

## 2014-11-25 NOTE — Progress Notes (Signed)
Patient ID: Christina Lawson, female   DOB: [birth year], 81 y.o.   MRN: [id number]   Location:  [facility]  Code Status: DNR  Goals of Care:Advanced Directive information Does patient have an advance directive?: Yes, Type of Advance Directive: Healthcare Power of [place];Living will;Out of facility DNR (pink MOST or yellow form), Pre-existing out of facility DNR order (yellow form or pink MOST form): Yellow form placed in chart (order not valid for inpatient use)  Chief Complaint  Patient presents with  . Medical Management of Chronic Issues    blood pressure, blood sugar, thyroid  . skin tear    left lower arm for one week  . Verrucous Vulgaris    on arms and face    HPI: Patient is a 81 y.o. white female seen in the [facility] today for med mgt of chronic diseases.   She also mentions having a skin tear on her left lower arm for the past week.  Tried to open window and knocked arm on desk or lamp.   She is bothered by "warts" on her face. Discussed salicylic acid on NWGN--5-62%.   Has mammogram and bone density in Nov. HTN: BP was ok today.   Hyperglycemia: Hba1c was 6.2.  Is also walking and using machines b/c cannot swim while taking   Hypothyroidism: TSH 3.64.    Hyperlipidemia: LDL 103, HDL 53, TG 95  Her allergist is going to go on Epic.    Balance:  Walks without a walker and grateful for it.  Once in a while when gets up from sitting, she does not feel secure.  Sees neurology annually (Dr. Zadie Rhine not been having symptoms of parkinson's.  Has her tremor and now her balance is worse.  For the past week, felt like she would fall.  Not lightheaded.  Does have some pain in the back of her head and was told she had arthritis in the past in the left side of her neck.  Not spinning.  Did not feel sure of herself.  No tinnitus.  Does stand a minute or so before walking.  Worse if she sits too long.  Just finished a period of therapy due to her left arm.  Has a  pulley on her door now.  No more arm pain.  Has been to a balance class, but not for a long time.    Vision:  Saw Dr. Dagoberto Ligas.  Has worn glasses since 81yo.  Did get new lenses in her glasses.  Got new tv and has to move up closer to her new flat screen tv to read.    Hearing:  Not as good.    Urinary frequency:  Goes to the restroom a lot.   Asks about device that holds urine back.  Has to get up a lot at night.  Does not want medications.  Did buy a patch at one time but did not use it.    Review of Systems:  ROS  Past Medical History  Diagnosis Date  . Unspecified essential hypertension   . Hyperlipidemia   . Skin cancer 2005    nose  . Irregular heart rhythm   . Shingles 2004  . Delivery normal 1960  . Breech delivery 1962  . Macular degeneration 2012  . Tremor 2006    familiar.  dx by Dr. Hedy Camara  . Torn meniscus 2006    right knee  . Sensorineural hearing loss, bilateral 2007  . Asthma   . Osteoporosis   .  Allergic rhinitis     pollen and grass  . Urinary frequency   . Unspecified hypothyroidism   . Blepharitis   . Movement disorder     Tremor    Past Surgical History  Procedure Laterality Date  . Tonsillectomy  1931  . Thyroidectomy  1955  . Cataract extraction  1974    Left, IOL implants  . Abdominal hysterectomy  1977  . Dilation and curettage of uterus  1977  . Hiatal hernia repair  1977  . Cataract extraction Right 1979    IOL implants  . Gallbladder surgery  1981  . Cosmetic surgery  1995    eye  . Skin cancer excision  2005    nose  . Cholecystectomy  1981  . Cystocele repair  1993    Social History:   reports that she has never smoked. She has never used smokeless tobacco. She reports that she drinks alcohol. She reports that she does not use illicit drugs.  Allergies  Allergen Reactions  . Clindamycin/Lincomycin     diarrhea  . Ibuprofen   . Penicillins   . Seasonal Ic [Cholestatin]     Medications: Patient's Medications    New Prescriptions   No medications on file  Previous Medications   ALBUTEROL SULFATE (PROAIR HFA IN)    Inhale into the lungs. Pro air as needed   ALOE VERA GEL    Apply topically as needed.     ALPRAZOLAM (XANAX) 0.25 MG TABLET    Take 1 tablet (0.25 mg total) by mouth at bedtime as needed.   BIOTIN 1000 MCG TABLET    Take 1,000 mcg by mouth daily.     DICLOFENAC SODIUM 3 % GEL    Apply 2 to 3 times daily to painful area of the left knee   DOCUSATE SODIUM (COLACE) 100 MG CAPSULE    Take 200 mg by mouth daily as needed.     EMOLLIENT (DERMEND BRUISE FORMULA EX)    Apply topically. Apply once daily   LORATADINE (CLARITIN) 10 MG TABLET    Take 10 mg by mouth daily. As needed   MINOXIDIL (ROGAINE) 2 % EXTERNAL SOLUTION    Apply topically 2 (two) times daily.     MOMETASONE (NASONEX) 50 MCG/ACT NASAL SPRAY    1 spray by Nasal route daily.     MONTELUKAST (SINGULAIR) 10 MG TABLET    Take 10 mg by mouth. One daily for cough   NYSTATIN-TRIAMCINOLONE EX    Apply topically. Apply thin layer to rash under breast twice daily for 5 days, as needed   OMEPRAZOLE (PRILOSEC) 20 MG CAPSULE    Take 20 mg by mouth daily.     SALINE (SIMPLY SALINE) 0.9 % AERS    1 spray by Nasal route 2 (two) times daily.     SIMVASTATIN (ZOCOR) 20 MG TABLET    TAKE 1 TABLET DAILY TO LOWER CHOLESTEROL   TRAMADOL (ULTRAM) 50 MG TABLET    Take 1 tablet (50 mg total) by mouth 3 (three) times daily as needed.   TRIAMCINOLONE CREAM (KENALOG) 0.1 %    Apply 1 application topically 2 (two) times daily.  Modified Medications   No medications on file  Discontinued Medications   No medications on file     Physical Exam: Filed Vitals:   11/25/14 0841  BP: 138/84  Pulse: 84  Temp: 97.4 F (36.3 C)  TempSrc: Oral  Weight: 135 lb (61.236 kg)  SpO2: 98%   Body  mass index is 25.52 kg/(m^2). Physical Exam   Labs reviewed: Basic Metabolic Panel:  Recent Labs  09/81/19 07/09/14 11/17/14  NA 140  --  142  K 4.5  --  4.2   BUN 14  --  14  CREATININE 0.9  --  0.8  TSH 5.00 6.34* 3.64   Liver Function Tests:  Recent Labs  01/06/14  AST 17  ALT 15  ALKPHOS 49   No results for input(s): LIPASE, AMYLASE in the last 8760 hours. No results for input(s): AMMONIA in the last 8760 hours. CBC: No results for input(s): WBC, NEUTROABS, HGB, HCT, MCV, PLT in the last 8760 hours. Lipid Panel:  Recent Labs  01/06/14 11/17/14  CHOL 167 171  HDL 56 53  LDLCALC 99 103  TRIG 122 95   Lab Results  Component Value Date   HGBA1C 6.2* 11/17/2014    Procedures since last appt:  Patient Care Team: Kermit Balo, DO as PCP - General (Geriatric Medicine) [facility] Barbaraann Share, MD as Consulting Physician (Pulmonary Disease) Gaylord Shih, MD as Consulting Physician (Cardiology) Ranee Gosselin, MD as Consulting Physician (Orthopedic Surgery) York Spaniel, MD as Consulting Physician (Neurology) Jacqlyn Krauss, MD as Referring Physician (Dermatology) Baxter Hire, MD as Consulting Physician (Allergy and Immunology) Mckinley Jewel, MD as Consulting Physician (Ophthalmology) Huel Cote, MD as Consulting Physician (Obstetrics and Gynecology) Ermalinda Barrios, MD as Consulting Physician (Otolaryngology) Landry Mellow, AUD (Audiology)  Assessment/Plan 1. Advance directive indicates patient wish for do-not-resuscitate status - DNR (Do Not Resuscitate)  2. Balance problem -ongoing and just completed rehab recently -not using assistive device -advised to get back to regular exercise and take the balance class that is available  -has been unable to swim/do water aerobics due to her arm wound  3. Verruca vulgaris -advised to use the salicylic acid treatment on the wart and monitor -if not going away, see derm  4. Postoperative hypothyroidism -cont current synthroid as she is clinically and chemically euthyroid  5. Tremor, essential -ongoing, follows with neurology, Dr.  Anne Hahn -not on medication for this  6. Type 2 diabetes mellitus without complication -diet and exercise controlled with hba1c 6.2, cont same approach  7. Essential hypertension -bp is well controlled without meds also, cont low sodium diet  8. Urinary frequency -is curious about wearing the device advertised on TV to hold her urine back--doubt this is very effective for a frequency issue--seems it would help more for stress incontinence  9. Hyperlipidemia -continues on zocor 20mg  with evening meal and tolerating well -last lipids very close to goal considering her age of 76, especially  Labs/tests ordered:  yes Next appt:  4 mos with labs before  Ferrah Panagopoulos L. Calah Gershman, D.O. Geriatrics [facility] [address], [place] [address] Cell Phone (Mon-Fri 8am-5pm):  [phone] On Call:  [phone] & follow prompts after 5pm & weekends Office Phone:  [phone] Office Fax:  [phone]

## 2014-12-07 ENCOUNTER — Encounter: Payer: Self-pay | Admitting: Nurse Practitioner

## 2014-12-10 ENCOUNTER — Other Ambulatory Visit: Payer: Self-pay

## 2014-12-10 DIAGNOSIS — Z1231 Encounter for screening mammogram for malignant neoplasm of breast: Secondary | ICD-10-CM

## 2014-12-29 ENCOUNTER — Encounter: Payer: Self-pay | Admitting: Internal Medicine

## 2014-12-31 DIAGNOSIS — Z23 Encounter for immunization: Secondary | ICD-10-CM | POA: Diagnosis not present

## 2015-01-02 ENCOUNTER — Other Ambulatory Visit: Payer: Self-pay | Admitting: Allergy and Immunology

## 2015-01-07 ENCOUNTER — Telehealth: Payer: Self-pay | Admitting: *Deleted

## 2015-01-07 ENCOUNTER — Encounter: Payer: Self-pay | Admitting: Nurse Practitioner

## 2015-01-07 ENCOUNTER — Ambulatory Visit (INDEPENDENT_AMBULATORY_CARE_PROVIDER_SITE_OTHER): Payer: Medicare Other | Admitting: Nurse Practitioner

## 2015-01-07 VITALS — BP 170/90 | HR 79 | Ht 61.0 in | Wt 137.6 lb

## 2015-01-07 DIAGNOSIS — G250 Essential tremor: Secondary | ICD-10-CM

## 2015-01-07 MED ORDER — ALPRAZOLAM 0.25 MG PO TABS: 0.2500 mg | ORAL_TABLET | Freq: Every day | ORAL | Status: DC

## 2015-01-07 NOTE — Patient Instructions (Signed)
Continue Xanax daily will refill Continue water aerobics 3 times weekly F/U yearly

## 2015-01-07 NOTE — Telephone Encounter (Signed)
I spoke to pt.  They are hers.  She will have someone pick up.  She resides at [facility]   I gave her hours for M-Thur 8-5 and Fri 8-12.    Placed up front.

## 2015-01-07 NOTE — Progress Notes (Signed)
I have read the note, and I agree with the clinical assessment and plan.  WILLIS,CHARLES KEITH   

## 2015-01-07 NOTE — Progress Notes (Signed)
[facility]  PATIENT: Christina Lawson DOB: 1924/03/13   REASON FOR VISIT: follow up for essential tremor HISTORY FROM:patient    HISTORY OF PRESENT ILLNESS:Christina Lawson is an 81 year old right-handed white female with a history of an essential tremor affecting mainly the upper extremities. The patient has done very well over the last one year, using alprazolam if needed for the tremor. The patient otherwise has not used any medications for the tremor. The patient indicates that the essential tremor does not impact her ability to perform activities of daily living. The patient has some sloppy handwriting, but her handwriting is legible. The patient is not having excessive problems with feeding herself, or using her hands otherwise. The patient has some mild gait instability, no falls have been noted. The patient notes that the tremors worse if she is upset or excited about something. The patient currently lives at [facility]. She is in water aerobics 3 times a week . The patient returns for an evaluation. She needs refills on her medication   REVIEW OF SYSTEMS: Full 14 system review of systems performed and notable only for those listed, all others are neg:  Constitutional: neg  Cardiovascular: neg Ear/Nose/Throat: neg  Skin: neg Eyes: neg Respiratory: neg Gastroitestinal: urinary frequency  Hematology/Lymphatic: easy bruising  Endocrine: neg Musculoskeletal:neg Allergy/Immunology: neg Neurological: tremors Psychiatric: anxiety Sleep : neg   ALLERGIES: Allergies  Allergen Reactions  . Clindamycin/Lincomycin     diarrhea  . Ibuprofen   . Penicillins   . Seasonal Ic [Cholestatin]     HOME MEDICATIONS: Outpatient Prescriptions Prior to Visit  Medication Sig Dispense Refill  . Albuterol Sulfate (PROAIR HFA IN) Inhale into the lungs. Pro air as needed    . Aloe Vera GEL Apply topically as needed.      . ALPRAZolam (XANAX) 0.25 MG tablet Take 1 tablet  (0.25 mg total) by mouth at bedtime as needed. 90 tablet 1  . Biotin 1000 MCG tablet Take 1,000 mcg by mouth daily.      . Diclofenac Sodium 3 % GEL Apply 2 to 3 times daily to painful area of the left knee (Patient taking differently: Apply 2 to 3 times daily to painful area of the left knee as needed) 200 g 3  . docusate sodium (COLACE) 100 MG capsule Take 200 mg by mouth daily as needed.      . Emollient (DERMEND BRUISE FORMULA EX) Apply topically. Apply once daily    . loratadine (CLARITIN) 10 MG tablet Take 10 mg by mouth daily. As needed    . minoxidil (ROGAINE) 2 % external solution Apply topically 2 (two) times daily.      . mometasone (NASONEX) 50 MCG/ACT nasal spray 1 spray by Nasal route daily.      . montelukast (SINGULAIR) 10 MG tablet Take 10 mg by mouth. One daily for cough    . NYSTATIN-TRIAMCINOLONE EX Apply topically. Apply thin layer to rash under breast twice daily for 5 days, as needed    . omeprazole (PRILOSEC) 20 MG capsule Take 20 mg by mouth daily.      Marland Kitchen PULMICORT FLEXHALER 180 MCG/ACT inhaler USE 2 INHALATIONS TWO TIMES DAILY TO PREVENT  COUGH OR WHEEZE. RINSE , GARGLE AND SPIT AFTER USE 1 Inhaler 2  . Saline (SIMPLY SALINE) 0.9 % AERS 1 spray by Nasal route 2 (two) times daily.      . simvastatin (ZOCOR) 20 MG tablet TAKE 1 TABLET DAILY TO LOWER CHOLESTEROL 90 tablet 1  .  traMADol (ULTRAM) 50 MG tablet Take 1 tablet (50 mg total) by mouth 3 (three) times daily as needed. 90 tablet 0  . triamcinolone cream (KENALOG) 0.1 % Apply 1 application topically 2 (two) times daily.     No facility-administered medications prior to visit.    PAST MEDICAL HISTORY: Past Medical History  Diagnosis Date  . Unspecified essential hypertension   . Hyperlipidemia   . Skin cancer 2005    nose  . Irregular heart rhythm   . Shingles 2004  . Delivery normal 1960  . Breech delivery 1962  . Macular degeneration 2012  . Tremor 2006    familiar.  dx by Dr. Quillian Quince  . Torn  meniscus 2006    right knee  . Sensorineural hearing loss, bilateral 2007  . Asthma   . Osteoporosis   . Allergic rhinitis     pollen and grass  . Urinary frequency   . Unspecified hypothyroidism   . Blepharitis   . Movement disorder     Tremor    PAST SURGICAL HISTORY: Past Surgical History  Procedure Laterality Date  . Tonsillectomy  1931  . Thyroidectomy  1955  . Cataract extraction  1974    Left, IOL implants  . Abdominal hysterectomy  1977  . Dilation and curettage of uterus  1977  . Hiatal hernia repair  1977  . Cataract extraction Right 1979    IOL implants  . Gallbladder surgery  1981  . Cosmetic surgery  1995    eye  . Skin cancer excision  2005    nose  . Cholecystectomy  1981  . Cystocele repair  1993    FAMILY HISTORY: Family History  Problem Relation Age of Onset  . Emphysema Father   . Allergies Father   . Asthma Father   . Heart disease Maternal Grandfather   . Brain cancer Mother   . Cancer Mother     Brain  . Hypertension Mother   . Cancer Sister     Cervical  . Endometrial cancer Sister   . Cancer Sister     Breast, metastasized to bone  . Stroke      SOCIAL HISTORY: Social History   Social History  . Marital Status: Single    Spouse Name: N/A  . Number of Children: 2  . Years of Education: college   Occupational History  . retired Education officer, museum     retired   Social History Main Topics  . Smoking status: Never Smoker   . Smokeless tobacco: Never Used  . Alcohol Use: Yes     Comment: on occ.  . Drug Use: No  . Sexual Activity: No   Other Topics Concern  . Not on file   Social History Narrative   Lives at [facility]. Married 1958.   Never smoked   Alcohol minimal   Exercise predominantly walking, water aerobic   Retired Education officer, museum           PHYSICAL EXAM  Filed Vitals:   01/07/15 1333  BP: 181/92  Pulse: 79  Height: 5\' 1"  (1.549 m)  Weight: 137 lb 9.6 oz (62.415 kg)   Body mass index  is 26.01 kg/(m^2). General: The patient is alert and cooperative at the time of the examination.  Skin: No significant peripheral edema is noted.  Neurologic Exam  Mental status: The patient is oriented x 3.  Cranial nerves: Facial symmetry is present. Speech is normal, no aphasia or dysarthria is  noted. Extraocular movements are full. Visual fields are full. No head and neck tremor, no vocal tremor noted.  Motor: The patient has good strength in all 4 extremities.  Sensory examination: Soft touch sensation on the face, arms, and legs is symmetric.  Coordination: The patient has good finger-nose-finger and heel-to-shin bilaterally. A mild intention tremor seen with finger-nose-finger.  Gait and station: The patient has a normal gait. Tandem gait is minimally unsteady. Romberg is negative. No drift is seen. No assistive device Reflexes: Deep tendon reflexes are symmetric.    DIAGNOSTIC DATA (LABS, IMAGING, TESTING) - I reviewed patient records, labs, notes, testing and imaging myself where available.  No results found for: WBC, HGB, HCT, MCV, PLT    Component Value Date/Time   NA 142 11/17/2014   K 4.2 11/17/2014   BUN 14 11/17/2014   CREATININE 0.8 11/17/2014   AST 17 01/06/2014   ALT 15 01/06/2014   ALKPHOS 49 01/06/2014   Lab Results  Component Value Date   CHOL 171 11/17/2014   HDL 53 11/17/2014   LDLCALC 103 11/17/2014   TRIG 95 11/17/2014   Lab Results  Component Value Date   HGBA1C 6.2* 11/17/2014    Lab Results  Component Value Date   TSH 3.64 11/17/2014      ASSESSMENT AND PLAN  81 y.o. year old female  here to follow-up for essential tremor.  Continue Xanax daily if needed will refill Continue water aerobics 3 times weekly F/U yearly Dennie Bible, [facility], [facility], APRN  [facility] [address], [place], [place] [address] [phone]

## 2015-01-07 NOTE — Telephone Encounter (Signed)
I called pt and LMVM for her to return call about sunglasses left in room 8.  If they are hers or not.

## 2015-01-11 ENCOUNTER — Other Ambulatory Visit: Payer: Self-pay

## 2015-01-11 MED ORDER — BUDESONIDE 180 MCG/ACT IN AEPB: 2.0000 | INHALATION_SPRAY | Freq: Two times a day (BID) | RESPIRATORY_TRACT | Status: DC

## 2015-01-29 ENCOUNTER — Ambulatory Visit
Admission: RE | Admit: 2015-01-29 | Discharge: 2015-01-29 | Disposition: A | Discharge status: Home / Self Care | Payer: Medicare Other | Source: Ambulatory Visit

## 2015-01-29 ENCOUNTER — Ambulatory Visit
Admission: RE | Admit: 2015-01-29 | Discharge: 2015-01-29 | Disposition: A | Discharge status: Home / Self Care | Payer: Medicare Other | Source: Ambulatory Visit | Attending: Nurse Practitioner | Admitting: Nurse Practitioner

## 2015-01-29 DIAGNOSIS — M8589 Other specified disorders of bone density and structure, multiple sites: Secondary | ICD-10-CM | POA: Diagnosis not present

## 2015-01-29 DIAGNOSIS — Z1231 Encounter for screening mammogram for malignant neoplasm of breast: Secondary | ICD-10-CM

## 2015-01-29 DIAGNOSIS — M8580 Other specified disorders of bone density and structure, unspecified site: Secondary | ICD-10-CM

## 2015-02-01 ENCOUNTER — Encounter: Payer: Self-pay | Admitting: *Deleted

## 2015-03-16 DIAGNOSIS — I10 Essential (primary) hypertension: Secondary | ICD-10-CM | POA: Diagnosis not present

## 2015-03-16 DIAGNOSIS — E119 Type 2 diabetes mellitus without complications: Secondary | ICD-10-CM | POA: Diagnosis not present

## 2015-03-16 LAB — BASIC METABOLIC PANEL
BUN: 17 mg/dL (ref 4–21)
CREATININE: 0.8 mg/dL (ref 0.5–1.1)
Glucose: 91 mg/dL
POTASSIUM: 4.5 mmol/L (ref 3.4–5.3)
Sodium: 140 mmol/L (ref 137–147)

## 2015-03-16 LAB — CBC AND DIFFERENTIAL
HCT: 42 % (ref 36–46)
Hemoglobin: 14 g/dL (ref 12.0–16.0)
PLATELETS: 238 10*3/uL (ref 150–399)
WBC: 6.2 10*3/mL

## 2015-03-16 LAB — HEMOGLOBIN A1C: Hemoglobin A1C: 6

## 2015-03-17 ENCOUNTER — Other Ambulatory Visit: Payer: Self-pay

## 2015-03-24 ENCOUNTER — Encounter: Payer: Self-pay | Admitting: Internal Medicine

## 2015-03-24 ENCOUNTER — Non-Acute Institutional Stay: Payer: Medicare Other | Admitting: Internal Medicine

## 2015-03-24 VITALS — BP 146/82 | HR 70 | Temp 97.2°F | Ht 61.0 in | Wt 137.0 lb

## 2015-03-24 DIAGNOSIS — E785 Hyperlipidemia, unspecified: Secondary | ICD-10-CM | POA: Diagnosis not present

## 2015-03-24 DIAGNOSIS — R739 Hyperglycemia, unspecified: Secondary | ICD-10-CM | POA: Diagnosis not present

## 2015-03-24 DIAGNOSIS — M19041 Primary osteoarthritis, right hand: Secondary | ICD-10-CM | POA: Diagnosis not present

## 2015-03-24 DIAGNOSIS — M899 Disorder of bone, unspecified: Secondary | ICD-10-CM | POA: Diagnosis not present

## 2015-03-24 DIAGNOSIS — M8580 Other specified disorders of bone density and structure, unspecified site: Secondary | ICD-10-CM

## 2015-03-24 DIAGNOSIS — R011 Cardiac murmur, unspecified: Secondary | ICD-10-CM

## 2015-03-24 DIAGNOSIS — M19049 Primary osteoarthritis, unspecified hand: Secondary | ICD-10-CM | POA: Insufficient documentation

## 2015-03-24 HISTORY — DX: Other specified disorders of bone density and structure, unspecified site: M85.80

## 2015-03-24 MED ORDER — VITAMIN D 50 MCG (2000 UT) PO CAPS: 1.0000 | ORAL_CAPSULE | Freq: Every day | ORAL | Status: DC

## 2015-03-24 NOTE — Progress Notes (Signed)
Patient ID: Christina Lawson, female   DOB: Jan 17, 1924, 82 y.o.   MRN: [id number]   Location:  [facility]  Code Status: DNR  Goals of Care:Advanced Directive information Does patient have an advance directive?: Yes, Type of Advance Directive: Healthcare Power of Peggs;Living will;Out of facility DNR (pink MOST or yellow form), Pre-existing out of facility DNR order (yellow form or pink MOST form): Yellow form placed in chart (order not valid for inpatient use)  Chief Complaint  Patient presents with  . Medical Management of Chronic Issues    blood pressure, blood sugar, thyroid.   . Hand Pain    right hand occasionally hurts and index finger clicks.    HPI: Patient is a 82 y.o. white female seen in the [facility] today for med mgt of chronic diseases.    Right index finger pain and sticking at time.    Dr. Marciano Sequin has resigned.  Seeing a new audiology person upcoming.  When wax removed by [facility] had some more difficulty with wax.    Senile osteopenia:  Bone density from nov reviewed with pt--osteopenic range. T -2.4 in left femoral neck (unchanged from 2012) Lumbar spine had too much degenerative disease Left forearm was -2.0 (not done before, back was done previous time)  Mammogram reviewed and normal  Had seen Dr. Daleen Squibb once.  Has never gotten assigned a new cardiologist.  Had cardiomegaly and saw him.  Has chronic murmur since childhood  Hyperlipidemia:  Last ldl was 103 with goal less than 100.  She is watching her intake of fatty foods and sweets.  Review of Systems:  Review of Systems  Constitutional: Negative for fever and chills.  HENT: Positive for hearing loss. Negative for congestion.   Eyes: Negative for blurred vision.       Glasses  Respiratory: Negative for shortness of breath.   Cardiovascular: Negative for chest pain and leg swelling.  Gastrointestinal: Negative for abdominal pain.  Genitourinary: Negative for dysuria.    Musculoskeletal: Positive for joint pain. Negative for falls.  Skin: Negative for rash.  Neurological: Negative for dizziness.  Psychiatric/Behavioral: Negative for memory loss.    Past Medical History  Diagnosis Date  . Unspecified essential hypertension   . Hyperlipidemia   . Skin cancer 2005    nose  . Irregular heart rhythm   . Shingles 2004  . Delivery normal 1960  . Breech delivery 1962  . Macular degeneration 2012  . Tremor 2006    familiar.  dx by Dr. Hedy Camara  . Torn meniscus 2006    right knee  . Sensorineural hearing loss, bilateral 2007  . Asthma   . Osteoporosis   . Allergic rhinitis     pollen and grass  . Urinary frequency   . Unspecified hypothyroidism   . Blepharitis   . Movement disorder     Tremor    Past Surgical History  Procedure Laterality Date  . Tonsillectomy  1931  . Thyroidectomy  1955  . Cataract extraction  1974    Left, IOL implants  . Abdominal hysterectomy  1977  . Dilation and curettage of uterus  1977  . Hiatal hernia repair  1977  . Cataract extraction Right 1979    IOL implants  . Gallbladder surgery  1981  . Cosmetic surgery  1995    eye  . Skin cancer excision  2005    nose  . Cholecystectomy  1981  . Cystocele repair  1993    Social History:  reports that she has never smoked. She has never used smokeless tobacco. She reports that she drinks alcohol. She reports that she does not use illicit drugs.  Allergies  Allergen Reactions  . Clindamycin/Lincomycin     diarrhea  . Ibuprofen   . Penicillins   . Seasonal Ic [Cholestatin]     Medications: Patient's Medications  New Prescriptions   No medications on file  Previous Medications   ALOE VERA GEL    Apply topically as needed.     ALPRAZOLAM (XANAX) 0.25 MG TABLET    Take 1 tablet (0.25 mg total) by mouth daily.   BIOTIN 1000 MCG TABLET    Take 1,000 mcg by mouth daily.     BUDESONIDE (PULMICORT FLEXHALER) 180 MCG/ACT INHALER    Inhale 2 puffs into the  lungs 2 (two) times daily.   DICLOFENAC SODIUM 3 % GEL    Apply 2 to 3 times daily to painful area of the left knee   DOCUSATE SODIUM (COLACE) 100 MG CAPSULE    Take 200 mg by mouth daily as needed.     EMOLLIENT (DERMEND BRUISE FORMULA EX)    Apply topically. Apply once daily   LORATADINE (CLARITIN) 10 MG TABLET    Take 10 mg by mouth daily. As needed   MINOXIDIL (ROGAINE) 2 % EXTERNAL SOLUTION    Apply topically 2 (two) times daily.     MOMETASONE (NASONEX) 50 MCG/ACT NASAL SPRAY    1 spray by Nasal route daily.     MONTELUKAST (SINGULAIR) 10 MG TABLET    Take 10 mg by mouth. One daily for cough   NYSTATIN-TRIAMCINOLONE EX    Apply topically. Apply thin layer to rash under breast twice daily for 5 days, as needed   OMEPRAZOLE (PRILOSEC) 20 MG CAPSULE    Take 20 mg by mouth daily.     PULMICORT FLEXHALER 180 MCG/ACT INHALER    USE 2 INHALATIONS TWO TIMES DAILY TO PREVENT  COUGH OR WHEEZE. RINSE , GARGLE AND SPIT AFTER USE   SALINE (SIMPLY SALINE) 0.9 % AERS    1 spray by Nasal route 2 (two) times daily.     SIMVASTATIN (ZOCOR) 20 MG TABLET    TAKE 1 TABLET DAILY TO LOWER CHOLESTEROL   TRAMADOL (ULTRAM) 50 MG TABLET    Take 1 tablet (50 mg total) by mouth 3 (three) times daily as needed.   TRIAMCINOLONE CREAM (KENALOG) 0.1 %    Apply 1 application topically 2 (two) times daily.  Modified Medications   No medications on file  Discontinued Medications   ALBUTEROL SULFATE (PROAIR HFA IN)    Inhale into the lungs. Pro air as needed     Physical Exam: Filed Vitals:   03/24/15 0933  BP: 146/82  Pulse: 70  Temp: 97.2 F (36.2 C)  TempSrc: Oral  Height: 5\' 1"  (1.549 m)  Weight: 137 lb (62.143 kg)  SpO2: 99%   Body mass index is 25.9 kg/(m^2). Physical Exam  Constitutional: She is oriented to person, place, and time. She appears well-developed and well-nourished. No distress.  Cardiovascular: Normal rate, regular rhythm and intact distal pulses.   Murmur heard. 2/6 systolic murmur    Pulmonary/Chest: Effort normal and breath sounds normal. No respiratory distress.  Abdominal: Bowel sounds are normal.  Musculoskeletal: Normal range of motion. She exhibits tenderness.  Of right index finger and erythema and swelling at base, some crepitus with rom  Neurological: She is alert and oriented to person, place, and time.  Skin: Skin is warm and dry.     Labs reviewed: Basic Metabolic Panel:  Recent Labs  16/10/96 11/17/14 03/16/15  NA  --  142 140  K  --  4.2 4.5  BUN  --  14 17  CREATININE  --  0.8 0.8  TSH 6.34* 3.64  --    Liver Function Tests: No results for input(s): AST, ALT, ALKPHOS, BILITOT, PROT, ALBUMIN in the last 8760 hours. No results for input(s): LIPASE, AMYLASE in the last 8760 hours. No results for input(s): AMMONIA in the last 8760 hours. CBC:  Recent Labs  03/16/15  WBC 6.2  HGB 14.0  HCT 42  PLT 238   Lipid Panel:  Recent Labs  11/17/14  CHOL 171  HDL 53  LDLCALC 103  TRIG 95   Lab Results  Component Value Date   HGBA1C 6.0 03/16/2015    Procedures since last appt: Bone density and mammogram reviewed with pt today  Patient Care Team: Kermit Balo, DO as PCP - General (Geriatric Medicine) [facility] Barbaraann Share, MD as Consulting Physician (Pulmonary Disease) Gaylord Shih, MD as Consulting Physician (Cardiology) Ranee Gosselin, MD as Consulting Physician (Orthopedic Surgery) York Spaniel, MD as Consulting Physician (Neurology) Jacqlyn Krauss, MD as Referring Physician (Dermatology) Baxter Hire, MD as Consulting Physician (Allergy and Immunology) Mckinley Jewel, MD as Consulting Physician (Ophthalmology) Huel Cote, MD as Consulting Physician (Obstetrics and Gynecology) Ermalinda Barrios, MD as Consulting Physician (Otolaryngology) Landry Mellow, AUD (Audiology)  Assessment/Plan 1. Hyperglycemia -cont to work on cutting back on sweets, exercising, remains in prediabetic range  and I cannot find where it was ever in the diabetic range  2. Senile osteopenia - bone density seems stable since 2012 as best I can tell (hip unchanged) but l spine done before and wrist now -counseled on taking vitamin D which she reports her calcium with D was stopped due to her great milk intake (but probably still not enough)--agrees to restart vitamin D only - Cholecalciferol (VITAMIN D) 2000 units CAPS; Take 1 capsule (2,000 Units total) by mouth daily.  Dispense: 30 capsule; Refill: 3 -cont weightbearing exercise  3. Osteoarthritis of finger, right -advised that if she develops a true trigger finger, injections can be performed -cont as needed tylenol and her squeeze ball exercise for now  4. Hyperlipidemia -lipids right around goal--not squabbling over 3 pts at 82 yo -cont dietary changes and exercise  5. Murmur, cardiac -present since childhood -has zero symptoms so I see no reason she needs to see cardiology at this point  Labs/tests ordered:  6 mos for annual wellness, labs before Next appt:  Cbc, cmp, tsh, flp, hba1c  Kinisha Soper L. Kyria Bumgardner, D.O. Geriatrics [facility] [address], [place] [address] Cell Phone (Mon-Fri 8am-5pm):  [phone] On Call:  [phone] & follow prompts after 5pm & weekends Office Phone:  [phone] Office Fax:  [phone]

## 2015-04-02 ENCOUNTER — Encounter: Payer: Self-pay | Admitting: Internal Medicine

## 2015-04-03 ENCOUNTER — Other Ambulatory Visit: Payer: Self-pay | Admitting: Internal Medicine

## 2015-04-15 ENCOUNTER — Encounter: Payer: Self-pay | Admitting: Allergy and Immunology

## 2015-04-15 ENCOUNTER — Ambulatory Visit (INDEPENDENT_AMBULATORY_CARE_PROVIDER_SITE_OTHER): Payer: Medicare Other | Admitting: Allergy and Immunology

## 2015-04-15 VITALS — BP 124/72 | HR 78 | Resp 20

## 2015-04-15 DIAGNOSIS — J309 Allergic rhinitis, unspecified: Secondary | ICD-10-CM

## 2015-04-15 DIAGNOSIS — H1010 Acute atopic conjunctivitis, unspecified eye: Secondary | ICD-10-CM

## 2015-04-15 DIAGNOSIS — J4530 Mild persistent asthma, uncomplicated: Secondary | ICD-10-CM

## 2015-04-15 MED ORDER — MOMETASONE FUROATE 50 MCG/ACT NA SUSP: 1.0000 | Freq: Every day | NASAL | Status: DC

## 2015-04-15 NOTE — Progress Notes (Signed)
     FOLLOW UP NOTE  RE: Christina Lawson MRN: [id number] DOB: March 17, 1923 [facility] [place] [address] [place] [address] Date of Office Visit: 04/15/2015  Subjective:  Christina Lawson is a 82 y.o. female who presents today for Medication Refill  Assessment:   1. Mild persistent asthma, well controlled.  2. Allergic rhinoconjunctivitis   3.      Complex medical history including followed by ophthalmology and cardiology. Plan:   Meds ordered this encounter  Medications  . mometasone (NASONEX) 50 MCG/ACT nasal spray    Sig: Place 1 spray into the nose daily.    Dispense:  17 g    Refill:  5   Patient Instructions  1.  Continue current medications--Pulmicort, Nasonex, Claritin and Singulair. 2.  ProAir 1-2 puffs every 6 hours as needed. 3.  Follow-up 6 months or needed. 4.  Call with any questions, concerns or recurring ProAir use.  HPI: Christina Lawson returns to the office in follow-up of allergic rhinoconjunctivitis and asthma, last visit August.  She is very pleased with how well she is done and has no new concerns or questions today.  She is requesting refill of Nasonex as she feels her other medications are up-to-date.  She denies any recent albuterol use.  She describes in particular participating in water aerobics several times a week and walking if the weather is warm and without difficulty.  She reports her medications are beneficial and no recent congestion, runny nose, sneezing, itchy watery eyes, cough or wheeze.  She does report recently new hearing aids with a new audiologist and continues to follow with all her other physicians without difficulty.  Denies ED or urgent care visits, prednisone or antibiotic courses. Reports sleep and activity are normal.  Christina Lawson has a current medication list which includes the following prescription(s): aloe vera, alprazolam, budesonide, vitamin d, diclofenac sodium, docusate sodium, emollient,  loratadine, minoxidil, mometasone, nystatin-triamcinolone, omeprazole, saline, simvastatin, tramadol, triamcinolone cream, albuterol, biotin, and montelukast.   Drug Allergies: Allergies  Allergen Reactions  . Clindamycin/Lincomycin     diarrhea  . Ibuprofen   . Penicillins   . Seasonal Ic [Cholestatin]    Objective:   Filed Vitals:   04/15/15 1027  BP: 124/72  Pulse: 78  Resp: 20   SpO2 Readings from Last 1 Encounters:  04/15/15 98%   Physical Exam  Constitutional: She is well-developed, well-nourished, and in no distress.  Alert interactive communicating easily.  HENT:  Head: Atraumatic.  Right Ear: Tympanic membrane and ear canal normal.  Left Ear: Tympanic membrane and ear canal normal.  Nose: Mucosal edema present. No rhinorrhea. No epistaxis.  Mouth/Throat: Oropharynx is clear and moist and mucous membranes are normal. No oropharyngeal exudate, posterior oropharyngeal edema or posterior oropharyngeal erythema.  Hearing aides.  Neck: Neck supple.  Cardiovascular: Normal rate, S1 normal and S2 normal.   No murmur heard. Pulmonary/Chest: Effort normal. She has no wheezes. She has no rhonchi. She has no rales.  Lymphadenopathy:    She has no cervical adenopathy.   Diagnostics: Spirometry:  FVC 1.30--64%; FEV1 1.16---78%.    Roselyn M. Ishmael Holter, MD  cc: Hollace Kinnier, DO

## 2015-04-15 NOTE — Patient Instructions (Signed)
   Continue current medications--Pulmicort, Nasonex, Claritin and Singulair.  ProAir 1-2 puffs every 6 hours as needed.  Follow-up  6 months or needed.  Call with any questions, concerns or recurring ProAir use.

## 2015-04-16 ENCOUNTER — Other Ambulatory Visit: Payer: Self-pay | Admitting: Allergy and Immunology

## 2015-04-16 MED ORDER — ALBUTEROL SULFATE HFA 108 (90 BASE) MCG/ACT IN AERS: 2.0000 | INHALATION_SPRAY | RESPIRATORY_TRACT | Status: DC | PRN

## 2015-04-16 NOTE — Telephone Encounter (Signed)
Sent in RX for proair and patient notified.

## 2015-04-16 NOTE — Telephone Encounter (Signed)
Pt called and she that Dr. Ishmael Holter wanted her to use her pro air but it expire 2015 so she need you to [facility] a [facility].

## 2015-05-07 ENCOUNTER — Other Ambulatory Visit: Payer: Self-pay | Admitting: Allergy and Immunology

## 2015-06-02 ENCOUNTER — Other Ambulatory Visit: Payer: Self-pay | Admitting: Allergy and Immunology

## 2015-06-03 DIAGNOSIS — H43813 Vitreous degeneration, bilateral: Secondary | ICD-10-CM | POA: Diagnosis not present

## 2015-06-03 DIAGNOSIS — H18519 Endothelial corneal dystrophy, unspecified eye: Secondary | ICD-10-CM | POA: Insufficient documentation

## 2015-06-03 DIAGNOSIS — H1851 Endothelial corneal dystrophy: Secondary | ICD-10-CM | POA: Diagnosis not present

## 2015-06-03 DIAGNOSIS — Z961 Presence of intraocular lens: Secondary | ICD-10-CM | POA: Diagnosis not present

## 2015-06-03 DIAGNOSIS — H353190 Nonexudative age-related macular degeneration, unspecified eye, stage unspecified: Secondary | ICD-10-CM | POA: Diagnosis not present

## 2015-06-03 DIAGNOSIS — H353132 Nonexudative age-related macular degeneration, bilateral, intermediate dry stage: Secondary | ICD-10-CM | POA: Diagnosis not present

## 2015-07-26 DIAGNOSIS — D692 Other nonthrombocytopenic purpura: Secondary | ICD-10-CM | POA: Diagnosis not present

## 2015-07-26 DIAGNOSIS — L719 Rosacea, unspecified: Secondary | ICD-10-CM | POA: Diagnosis not present

## 2015-07-26 DIAGNOSIS — L820 Inflamed seborrheic keratosis: Secondary | ICD-10-CM | POA: Diagnosis not present

## 2015-07-26 DIAGNOSIS — D225 Melanocytic nevi of trunk: Secondary | ICD-10-CM | POA: Diagnosis not present

## 2015-07-26 DIAGNOSIS — L821 Other seborrheic keratosis: Secondary | ICD-10-CM | POA: Diagnosis not present

## 2015-07-26 DIAGNOSIS — L814 Other melanin hyperpigmentation: Secondary | ICD-10-CM | POA: Diagnosis not present

## 2015-07-26 DIAGNOSIS — D1800 Hemangioma unspecified site: Secondary | ICD-10-CM | POA: Diagnosis not present

## 2015-07-26 DIAGNOSIS — L409 Psoriasis, unspecified: Secondary | ICD-10-CM | POA: Diagnosis not present

## 2015-07-26 DIAGNOSIS — L304 Erythema intertrigo: Secondary | ICD-10-CM | POA: Diagnosis not present

## 2015-07-26 DIAGNOSIS — Z85828 Personal history of other malignant neoplasm of skin: Secondary | ICD-10-CM | POA: Diagnosis not present

## 2015-08-06 DIAGNOSIS — H1851 Endothelial corneal dystrophy: Secondary | ICD-10-CM | POA: Diagnosis not present

## 2015-08-06 DIAGNOSIS — H01001 Unspecified blepharitis right upper eyelid: Secondary | ICD-10-CM | POA: Diagnosis not present

## 2015-08-06 DIAGNOSIS — H353131 Nonexudative age-related macular degeneration, bilateral, early dry stage: Secondary | ICD-10-CM | POA: Diagnosis not present

## 2015-08-06 DIAGNOSIS — Z961 Presence of intraocular lens: Secondary | ICD-10-CM | POA: Diagnosis not present

## 2015-09-21 DIAGNOSIS — E039 Hypothyroidism, unspecified: Secondary | ICD-10-CM | POA: Diagnosis not present

## 2015-09-21 DIAGNOSIS — I10 Essential (primary) hypertension: Secondary | ICD-10-CM | POA: Diagnosis not present

## 2015-09-21 DIAGNOSIS — E785 Hyperlipidemia, unspecified: Secondary | ICD-10-CM | POA: Diagnosis not present

## 2015-09-21 DIAGNOSIS — R739 Hyperglycemia, unspecified: Secondary | ICD-10-CM | POA: Diagnosis not present

## 2015-09-21 LAB — BASIC METABOLIC PANEL
BUN: 14 mg/dL (ref 4–21)
Creatinine: 0.7 mg/dL (ref 0.5–1.1)
Glucose: 99 mg/dL
Potassium: 4.6 mmol/L (ref 3.4–5.3)
Sodium: 144 mmol/L (ref 137–147)

## 2015-09-21 LAB — TSH: TSH: 4.4 u[IU]/mL (ref 0.41–5.90)

## 2015-09-21 LAB — LIPID PANEL
Cholesterol: 179 mg/dL (ref 0–200)
HDL: 61 mg/dL (ref 35–70)
LDL Cholesterol: 101 mg/dL
Triglycerides: 88 mg/dL (ref 40–160)

## 2015-09-21 LAB — HEPATIC FUNCTION PANEL
ALK PHOS: 53 U/L (ref 25–125)
ALT: 17 U/L (ref 7–35)
AST: 20 U/L (ref 13–35)
Bilirubin, Total: 0.5 mg/dL

## 2015-09-21 LAB — CBC AND DIFFERENTIAL
HCT: 43 % (ref 36–46)
Hemoglobin: 14.1 g/dL (ref 12.0–16.0)
Platelets: 230 10*3/uL (ref 150–399)
WBC: 4.6 10^3/mL

## 2015-09-21 LAB — HEMOGLOBIN A1C: Hemoglobin A1C: 5.7

## 2015-09-22 ENCOUNTER — Encounter: Payer: Self-pay | Admitting: *Deleted

## 2015-09-29 ENCOUNTER — Non-Acute Institutional Stay: Payer: Medicare Other | Admitting: Internal Medicine

## 2015-09-29 ENCOUNTER — Encounter: Payer: Self-pay | Admitting: Internal Medicine

## 2015-09-29 VITALS — BP 118/68 | HR 77 | Temp 98.9°F | Ht 61.0 in | Wt 137.0 lb

## 2015-09-29 DIAGNOSIS — B372 Candidiasis of skin and nail: Secondary | ICD-10-CM

## 2015-09-29 DIAGNOSIS — E785 Hyperlipidemia, unspecified: Secondary | ICD-10-CM | POA: Diagnosis not present

## 2015-09-29 DIAGNOSIS — R739 Hyperglycemia, unspecified: Secondary | ICD-10-CM

## 2015-09-29 DIAGNOSIS — J4520 Mild intermittent asthma, uncomplicated: Secondary | ICD-10-CM | POA: Diagnosis not present

## 2015-09-29 DIAGNOSIS — I10 Essential (primary) hypertension: Secondary | ICD-10-CM | POA: Diagnosis not present

## 2015-09-29 DIAGNOSIS — M8580 Other specified disorders of bone density and structure, unspecified site: Secondary | ICD-10-CM

## 2015-09-29 DIAGNOSIS — M899 Disorder of bone, unspecified: Secondary | ICD-10-CM

## 2015-09-29 DIAGNOSIS — Z0000 Encounter for general adult medical examination without abnormal findings: Secondary | ICD-10-CM | POA: Diagnosis not present

## 2015-09-29 DIAGNOSIS — Z7189 Other specified counseling: Secondary | ICD-10-CM

## 2015-09-29 DIAGNOSIS — E039 Hypothyroidism, unspecified: Secondary | ICD-10-CM | POA: Diagnosis not present

## 2015-09-29 DIAGNOSIS — N3281 Overactive bladder: Secondary | ICD-10-CM | POA: Diagnosis not present

## 2015-09-29 MED ORDER — MIRABEGRON ER 25 MG PO TB24: 25.0000 mg | ORAL_TABLET | Freq: Every day | ORAL | Status: DC

## 2015-09-29 MED ORDER — NYSTATIN 100000 UNIT/GM EX POWD: Freq: Three times a day (TID) | CUTANEOUS | Status: DC

## 2015-09-29 NOTE — Patient Instructions (Signed)
We will recheck your thyroid next time Call if your neck and back pain returns for some therapy We will bring you myrbetriq samples for your bladder I sent in nystatin powder for under your breasts. We completed your MOST form today.

## 2015-09-29 NOTE — Progress Notes (Signed)
Location:   [place] of Service:  Clinic (12) Provider: Shadavia Dampier L. Mariea Clonts, D.O., C.M.D.  Patient Care Team: Gayland Curry, DO as PCP - General (Geriatric Medicine) [facility] Kathee Delton, MD as Consulting Physician (Pulmonary Disease) Renella Cunas, MD as Consulting Physician (Cardiology) Latanya Maudlin, MD as Consulting Physician (Orthopedic Surgery) Kathrynn Ducking, MD as Consulting Physician (Neurology) Devra Dopp, MD as Referring Physician (Dermatology) Gean Quint, MD as Consulting Physician (Allergy and Immunology) Shon Hough, MD as Consulting Physician (Ophthalmology) Paula Compton, MD as Consulting Physician (Obstetrics and Gynecology) Vicie Mutters, MD as Consulting Physician (Otolaryngology) Raeanne Gathers, AUD (Audiology)  Extended Emergency Contact Information Primary Emergency Contact: Rossi,Lynn Address: [place] [phone] Johnnette Litter of [place] Phone: [phone] Relation: Daughter  Code Status: DNR Goals of Care: Advanced Directive information Advanced Directives 09/29/2015  Does patient have an advance directive? Yes  Type of Paramedic of [place];Living will;Out of facility DNR (pink MOST or yellow form)  Copy of advanced directive(s) in chart? Yes  Pre-existing out of facility DNR order (yellow form or pink MOST form) Yellow form placed in chart (order not valid for inpatient use)  MOST today: DNR, limited interventions, abx if appropriate, ivf if appropriate, no feeding tubes, reviewed with patient herself who is fully alert and oriented  Chief Complaint  Patient presents with  . Annual Exam    wellness exam  . MMSE    30/30 passed clock    HPI: Patient is a 82 y.o. female seen in today for an annual wellness exam.    Depression screen [facility] 2/9 09/29/2015 01/12/2014 01/12/2014  Decreased Interest 0 0 0  Down, Depressed, Hopeless 0 0 0    PHQ - 2 Score 0 0 0    Fall Risk  09/29/2015 09/01/2014 01/12/2014 01/12/2014  Falls in the past year? No No No No   MMSE - Mini Mental State Exam 09/29/2015  Orientation to time 5  Orientation to Place 5  Registration 3  Attention/ Calculation 5  Recall 3  Language- name 2 objects 2  Language- repeat 1  Language- follow 3 step command 3  Language- read & follow direction 1  Write a sentence 1  Copy design 1  Total score 30     Health Maintenance  Topic Date Due  . URINE MICROALBUMIN  11/15/1935  . TETANUS/TDAP  11/14/1944  . INFLUENZA VACCINE  10/12/2015  . DEXA SCAN  Completed  . ZOSTAVAX  Completed  . PNA vac Low Risk Adult  Completed    Functional Status Survey: Is the patient deaf or have difficulty hearing?: Yes Does the patient have difficulty seeing, even when wearing glasses/contacts?: Yes Does the patient have difficulty concentrating, remembering, or making decisions?: No Does the patient have difficulty walking or climbing stairs?: No Does the patient have difficulty dressing or bathing?: No Does the patient have difficulty doing errands alone such as visiting a doctor's office or shopping?: No Exercise?  Current Exercise Habits: Home exercise routine, Type of exercise: stretching;walking;Other - see comments (swimming), Time (Minutes): 60, Frequency (Times/Week): 3, Weekly Exercise (Minutes/Week): 180, Intensity: Moderate Exercise limited by: orthopedic condition(s) Diet?  Eats in dining room except breakfast. Vision Screening Comments: Dr. Kathrin Penner April 2017 Hearing:  Is HOH and getting hearing aides repaired at present. Dentition:  Going to dentist next month.  No known problems.  Getting different  xrays.   Pain:  Back pain that starts in neck.  Had xrays done which showed arthritis.  When in grammar school, had infection in back of her head/neck and she has had aching/and prominence on the left side of her upper neck.  Goes into posterior left shoulcer  into back.  Does hot and warm compresses, shower. Takes tylenol or motrin.  It's an aching, nuisance pain.  4/10.   Past Medical History  Diagnosis Date  . Unspecified essential hypertension   . Hyperlipidemia   . Skin cancer 2005    nose  . Irregular heart rhythm   . Shingles 2004  . Delivery normal 1960  . Breech delivery 1962  . Macular degeneration 2012  . Tremor 2006    familiar.  dx by Dr. Quillian Quince  . Torn meniscus 2006    right knee  . Sensorineural hearing loss, bilateral 2007  . Asthma   . Osteoporosis   . Allergic rhinitis     pollen and grass  . Urinary frequency   . Unspecified hypothyroidism   . Blepharitis   . Movement disorder     Tremor    Past Surgical History  Procedure Laterality Date  . Tonsillectomy  1931  . Thyroidectomy  1955  . Cataract extraction  1974    Left, IOL implants  . Abdominal hysterectomy  1977  . Dilation and curettage of uterus  1977  . Hiatal hernia repair  1977  . Cataract extraction Right 1979    IOL implants  . Gallbladder surgery  1981  . Cosmetic surgery  1995    eye  . Skin cancer excision  2005    nose  . Cholecystectomy  1981  . Cystocele repair  1993   Social History   Social History  . Marital Status: Single    Spouse Name: N/A  . Number of Children: 2  . Years of Education: college   Occupational History  . retired Education officer, museum     retired   Social History Main Topics  . Smoking status: Never Smoker   . Smokeless tobacco: Never Used  . Alcohol Use: Yes     Comment: on occ.  . Drug Use: No  . Sexual Activity: No   Other Topics Concern  . Not on file   Social History Narrative   Lives at [place]. Married 1958.   Never smoked   Alcohol minimal   Exercise predominantly walking, water aerobic   Retired Education officer, museum          Allergies  Allergen Reactions  . Clindamycin/Lincomycin     diarrhea  . Ibuprofen   . Penicillins   . Seasonal Ic [Cholestatin]         Medication List       This list is accurate as of: 09/29/15 11:21 AM.  Always use your most recent med list.               albuterol 108 (90 Base) MCG/ACT inhaler  Commonly known as:  PROAIR HFA  Inhale 2 puffs into the lungs every 4 (four) hours as needed for wheezing or shortness of breath.     ALPRAZolam 0.25 MG tablet  Commonly known as:  XANAX  Take 1 tablet (0.25 mg total) by mouth daily.     Biotin 1000 MCG tablet  Take 1,000 mcg by mouth daily. Reported on 04/15/2015     docusate sodium 100 MG capsule  Commonly known as:  COLACE  Take 200 mg by mouth daily as needed.     loratadine 10 MG tablet  Commonly known as:  CLARITIN  Take 10 mg by mouth daily. As needed     minoxidil 2 % external solution  Commonly known as:  ROGAINE  Apply topically 2 (two) times daily.     mometasone 50 MCG/ACT nasal spray  Commonly known as:  NASONEX  Place 1 spray into the nose daily.     montelukast 10 MG tablet  Commonly known as:  SINGULAIR  TAKE ONE TABLET EACH EVENING TO PREVENT COUGH OR WHEEZE.     PULMICORT FLEXHALER 180 MCG/ACT inhaler  Generic drug:  budesonide  USE 2 INHALATIONS TWICE A DAY     SIMPLY SALINE 0.9 % Aers  Generic drug:  Saline  1 spray by Nasal route 2 (two) times daily.     traMADol 50 MG tablet  Commonly known as:  ULTRAM  Take 1 tablet (50 mg total) by mouth 3 (three) times daily as needed.     triamcinolone cream 0.1 %  Commonly known as:  KENALOG  Apply 1 application topically 2 (two) times daily.     Vitamin D 2000 units Caps  Take 1 capsule (2,000 Units total) by mouth daily.        Review of Systems:  Review of Systems  Constitutional: Negative for fever, chills and malaise/fatigue.  HENT: Positive for hearing loss.        Hearing aides  Eyes: Positive for blurred vision.       Glasses  Respiratory: Negative for cough and shortness of breath.   Cardiovascular: Negative for chest pain, palpitations and leg swelling.   Gastrointestinal: Negative for abdominal pain, diarrhea, constipation, blood in stool and melena.  Genitourinary: Positive for urgency and frequency. Negative for dysuria, hematuria and flank pain.  Musculoskeletal: Positive for myalgias, back pain and neck pain. Negative for joint pain and falls.  Skin: Negative for rash.       Seborrheic keratoses of back, rash beneath breasts  Neurological: Positive for tremors. Negative for dizziness, loss of consciousness and weakness.  Endo/Heme/Allergies: Does not bruise/bleed easily.  Psychiatric/Behavioral: Negative for depression and memory loss. The patient is not nervous/anxious and does not have insomnia.     Physical Exam: Filed Vitals:   09/29/15 1023  BP: 118/68  Pulse: 77  Temp: 98.9 F (37.2 C)  TempSrc: Oral  Height: 5\' 1"  (1.549 m)  Weight: 137 lb (62.143 kg)  SpO2: 97%   Body mass index is 25.9 kg/(m^2). Physical Exam  Constitutional: She is oriented to person, place, and time. She appears well-developed and well-nourished. No distress.  HENT:  Head: Normocephalic and atraumatic.  Right Ear: External ear normal.  Left Ear: External ear normal.  Nose: Nose normal.  Mouth/Throat: Oropharynx is clear and moist.  Small amt of cerumen in ears (just cleaned yesterday at audiologist)  Eyes: Conjunctivae and EOM are normal. Pupils are equal, round, and reactive to light.  glasses  Neck: Normal range of motion. Neck supple. No JVD present. No thyromegaly present.  Scar from thyroidectomy  Cardiovascular: Normal rate, regular rhythm and intact distal pulses.   Murmur heard. Pulmonary/Chest: Breath sounds normal. No respiratory distress. She has no wheezes. She has no rales. Right breast exhibits skin change. Right breast exhibits no inverted nipple, no mass, no nipple discharge and no tenderness. Left breast exhibits skin change. Left breast exhibits no inverted nipple, no mass, no nipple discharge and no  tenderness.  Erythema and  moist sticky material beneath both breasts  Abdominal: Soft. Bowel sounds are normal. She exhibits no distension and no mass. There is no tenderness.  Musculoskeletal: Normal range of motion. She exhibits no edema or tenderness.  Lymphadenopathy:    She has no cervical adenopathy.  Neurological: She is alert and oriented to person, place, and time. She has normal reflexes. No cranial nerve deficit.  Skin: Skin is warm and dry.  Seborrheic keratoses of back and yeast beneath breasts  Psychiatric: She has a normal mood and affect. Her behavior is normal. Judgment and thought content normal.    Labs reviewed: Basic Metabolic Panel:  Recent Labs  11/17/14 03/16/15 09/21/15  NA 142 140 144  K 4.2 4.5 4.6  BUN 14 17 14   CREATININE 0.8 0.8 0.7  TSH 3.64  --  4.40   Liver Function Tests:  Recent Labs  09/21/15  AST 20  ALT 17  ALKPHOS 53   No results for input(s): LIPASE, AMYLASE in the last 8760 hours. No results for input(s): AMMONIA in the last 8760 hours. CBC:  Recent Labs  03/16/15 09/21/15  WBC 6.2 4.6  HGB 14.0 14.1  HCT 42 43  PLT 238 230   Lipid Panel:  Recent Labs  11/17/14 09/21/15  CHOL 171 179  HDL 53 61  LDLCALC 103 101  TRIG 95 88   Lab Results  Component Value Date   HGBA1C 5.7 09/21/2015    Procedures: EKG reviewed today.  Has cardiomegaly.  Has felt no problems.  Assessment/Plan 1. Essential hypertension - bp well controlled on current regimen, no changes - EKG 12-Lead  2. Asthma, intrinsic, mild intermittent, uncomplicated -asymptomatic, no wheezing on exam, cont same medications  3. Hypothyroidism, unspecified hypothyroidism type -slightly elevated tsh this time, check tsh and free t4 before next visit   4. Senile osteopenia -cont vitamin D and weightbearing exercise and swimming  5. Hyperlipidemia -she would like to stop her statin therapy at this time -LDL currently 101 and she eats a healthy diet--I tried to convince her to keep  it previously, but she really does not want to take it anymore for fear of weakening muscles over time  6. Hyperglycemia -hba1c 5.7 which is great, cont healthy diet and exercise program  7. Medicare annual wellness visit, subsequent -see hpi, she is up to date for her age range  18.  Candidal skin infection beneath breasts--nystatin powder order sent to pharmacy  9.  Advance care planning:  MOST done--this process took about 20 mins to review and complete  10.  OAB:  myrbetriq samples needed--will bring from office next week 25mg .  Labs/tests ordered: tsh, free T4 before Next appt:  6 mos med mgt   Cailee Blanke L. Endora Teresi, D.O. [facility] [address], [place] [address] Cell Phone (Mon-Fri 8am-5pm):  [phone] On Call:  [phone] & follow prompts after 5pm & weekends Office Phone:  [phone] Office Fax:  [phone]

## 2015-10-25 ENCOUNTER — Encounter: Payer: Self-pay | Admitting: Allergy & Immunology

## 2015-10-25 ENCOUNTER — Ambulatory Visit (INDEPENDENT_AMBULATORY_CARE_PROVIDER_SITE_OTHER): Payer: Medicare Other | Admitting: Allergy & Immunology

## 2015-10-25 VITALS — BP 130/78 | HR 76 | Temp 97.8°F | Resp 16

## 2015-10-25 DIAGNOSIS — J4530 Mild persistent asthma, uncomplicated: Secondary | ICD-10-CM | POA: Diagnosis not present

## 2015-10-25 DIAGNOSIS — H1045 Other chronic allergic conjunctivitis: Secondary | ICD-10-CM | POA: Diagnosis not present

## 2015-10-25 DIAGNOSIS — J309 Allergic rhinitis, unspecified: Secondary | ICD-10-CM

## 2015-10-25 DIAGNOSIS — J302 Other seasonal allergic rhinitis: Secondary | ICD-10-CM | POA: Diagnosis not present

## 2015-10-25 DIAGNOSIS — H1010 Acute atopic conjunctivitis, unspecified eye: Secondary | ICD-10-CM

## 2015-10-25 NOTE — Progress Notes (Signed)
FOLLOW UP  Date of Service/Encounter:  10/25/15   Assessment:   Mild persistent asthma, uncomplicated  Allergic rhinoconjunctivitis   Asthma Reportables:  Severity: : mild persistent  Risk: low Control: well controlled  Seasonal Influenza Vaccine: no but encouraged    Plan/Recommendations:    1. Mild persistent asthma, uncomplicated - Continue Pulmicort two puffs twice daily. - Continue with ProAir 2-4 puffs every 4-6 hours as needed. - Spirometry looked fairly normal today.  2. Allergic rhinoconjunctivitis - The patient is having increased mucous production to 3 times a week. - This occurs in the evenings, although not when she is supine. - Could be secondary to her cessation of omeprazole, resulting in increased reflux symptoms. - However, she prefers to be off of omeprazole and she clearly endorses symptoms originating inrather than her stomach. - We will try Dymista one spray per nostril twice daily, which would treat her nasal symptoms via a nasal steroid as well as a nasal antihistamine. - During this time, I asked her to stop the Nasonex. - If she ends up having symptom relief from the [place],, I asked her to call us and we will send in a prescription. - Otherwise, she can restart the Nasonex at 2 sprays per nostril once a day. - Continue with montelukast and loratadine. - She can take an extra loratadine if needed for worsening symptoms.   3. Return to clinic in six months.      Subjective:   Christina Lawson is a 82 y.o. female presenting today for follow up of  Chief Complaint  Patient presents with  . Asthma    "doing ok"  .  Christina Lawson has a history of the following: Patient Active Problem List   Diagnosis Date Noted  . Hyperglycemia 03/24/2015  . Senile osteopenia 03/24/2015  . 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 Murmur, cardiac 03/24/2015  . Conjunctivitis 01/12/2014  . Edema 08/20/2013  . Senile ecchymosis 06/23/2013  .  Rash and nonspecific skin eruption 03/26/2013  . Pain in left knee 01/06/2013  . Osteopenia 12/16/2012  . Urinary frequency 09/23/2012  . Impacted cerumen 09/23/2012  . Hypothyroidism 09/23/2012  . Essential hypertension   . Hyperlipidemia   . Tremor, essential   . Cardiomegaly - hypertensive 08/10/2010  . Asthma, intrinsic 05/31/2010  . Dyspnea 05/31/2010    History obtained from: chart review and patient.  Christina Lawson was referred by Hollace Kinnier, DO.     Tattiana is an 82 year old female with a history of mild persistent asthma, allergic rhinoconjunctivitis, and a complex medical history including diabetes, osteoarthritis, hypothyroidism, hypertension, hypercholesterolemia, and cardiomegaly who presents for a follow-up appointment. Albana was last seen in February 2007 by Dr. Ishmael Holter, who has since left this practice. At that time, she was doing well. She was continued on her Pulmicort, Nasonex, Singulair, and Claritin.  Since last visit, Dyna reports that things have gone well. Her asthma has been under good control. She is concerned with increased mucous production that occurs 2-3 times per week. She feels that this is nasal in origin. It happens after dinner, however before she is laying supine to sleep. She is able to spit up the increased mucous and it appears thin and yellow tinged. She does not have any blood-tinged sputum.  Asthma/Respiratory Symptom History: Rescue medication: ProAir (last used one time since the last visit) Controller(s): Pulmicort 2 puffs twice daily Triggers:  exercise  Average frequency of daytime symptoms: absent Average frequency of nocturnal symptoms: absent  Average frequency  of exercise symptoms: absent Hospitalizations for respiratory symptoms since last visit (self report): 0 ACT or TRACK score: 23 ED or Urgent Care visits for respiratory symptoms since last visit (self report): 0 Oral/systemic corticosteroids for respiratory  symptoms since last visit: 0  Allergic Rhinitis Symptom History:  Symptoms: nasal congestion, rhinorrhea, morning sore throat and cough Times of year: Throughout the year She is having increased mucous production at night that requires spitting up Exacerbating environments: no identifiable factor  Treatments tried: intranasal steroids: Nasonex two sprays twice daily, oral antihistamines: Singulair 10mg , leukotrienes inhibitors:  loratadine 10mg  daily Allergy tested in the past: Yes  On IT in the past: Yes (for 50+ years)  She is on nystatin powder for a rash under her breasts. She has stopped her statin around a few months ago. Her cholesterol numbers have been good. She did stop her omeprazole around two months ago. She is now being followed by Dr. Mariea Clonts because the previous physician no longer saw patients in the assisted living facility where Christina Lawson is living. She will seeing a Dermatologist soon for rashes that pop up occasionally. She did see a cardiologist for cardiomegaly, but was told to follow up as needed. She has been trying to decrease her medications, and her primary care physician helping her decrease those. Otherwise, there have been no changes to the past medical history, surgical history, family history, or social history.     Review of Systems: a 14-point review of systems is pertinent for what is mentioned in HPI.  Otherwise, all other systems were negative. Constitutional: negative other than that listed in the HPI Eyes: negative other than that listed in the HPI Ears, nose, mouth, throat, and face: negative other than that listed in the HPI Respiratory: negative other than that listed in the HPI Cardiovascular: negative other than that listed in the HPI Gastrointestinal: negative other than that listed in the HPI Genitourinary: negative other than that listed in the HPI Integument: negative other than that listed in the HPI Hematologic: negative other than that listed in  the HPI Musculoskeletal: negative other than that listed in the HPI Neurological: negative other than that listed in the HPI Allergy/Immunologic: negative other than that listed in the HPI    Objective:   Blood pressure 130/78, pulse 76, temperature 97.8 F (36.6 C), temperature source Oral, resp. rate 16. There is no height or weight on file to calculate BMI.   Physical Exam:  General: Alert, interactive, in no acute distress. Very pleasant talkative active 82 year old female. HEENT: TMs pearly gray, turbinates minimally edematous without discharge, post-pharynx erythematous. Neck: Supple without lymphadenopathy. Lungs: Clear to auscultation without wheezing, rhonchi or rales. CV: Normal S1, S2 without murmurs. Abdomen: Nondistended, nontender. Skin:Warm and dry, without lesions or rashes. There are a smattering of age-related spots, but nothing concerning from my perspective. There are  Extremities: No clubbing, cyanosis or edema. Neuro: Grossly intact. Answering questions and interactive. No focal deficits noted.    Diagnostic studies:  Spirometry: results abnormal (FEV1: 1.14/93%, FVC: 1.33/78%, FEV1/FVC: 86%).    Spirometry consistent with possible restrictive disease although overall her numbers are unchanged from previous spirometry.     Salvatore Marvel, MD [facility]

## 2015-10-25 NOTE — Patient Instructions (Signed)
1. Mild persistent asthma, uncomplicated - Continue Pulmicort two puffs twice daily. - Continue with ProAir 2-4 puffs every 4-6 hours as needed. - Breathing tests look great today.   2. Allergic rhinoconjunctivitis - We will try Dymista one spray per nostril twice daily. - Stop the Nasonex during this time. - If you like Dymista, call us in two weeks to let us know and we can send in a prescription. - Otherwise go back to Nasonex and decrease to two sprays per nostril daily if you are interested. - Continue with montelukast and loratadine. - You can take an extra loratadine if needed for worsening symptoms.   3. Return to clinic in six months.

## 2015-10-26 ENCOUNTER — Encounter: Payer: Self-pay | Admitting: *Deleted

## 2015-11-08 ENCOUNTER — Other Ambulatory Visit: Payer: Self-pay | Admitting: *Deleted

## 2015-11-08 MED ORDER — MONTELUKAST SODIUM 10 MG PO TABS: ORAL_TABLET | ORAL | 1 refills | Status: DC

## 2015-11-18 ENCOUNTER — Telehealth: Payer: Self-pay

## 2015-11-18 DIAGNOSIS — H9190 Unspecified hearing loss, unspecified ear: Secondary | ICD-10-CM

## 2015-11-18 NOTE — Telephone Encounter (Signed)
Patient called the triage line requesting a referral to her Audiologist Dr.Carolyn Cecille Rubin. Patient is already established with Dr.Franks and was instructed to call PCP prior to appointment on 11-23-15 to get referral for hearing test to be performed.

## 2015-11-23 DIAGNOSIS — H903 Sensorineural hearing loss, bilateral: Secondary | ICD-10-CM | POA: Diagnosis not present

## 2015-12-10 DIAGNOSIS — H353122 Nonexudative age-related macular degeneration, left eye, intermediate dry stage: Secondary | ICD-10-CM | POA: Diagnosis not present

## 2015-12-10 DIAGNOSIS — H5315 Visual distortions of shape and size: Secondary | ICD-10-CM | POA: Diagnosis not present

## 2015-12-10 DIAGNOSIS — Z961 Presence of intraocular lens: Secondary | ICD-10-CM | POA: Diagnosis not present

## 2015-12-10 DIAGNOSIS — H353112 Nonexudative age-related macular degeneration, right eye, intermediate dry stage: Secondary | ICD-10-CM | POA: Diagnosis not present

## 2015-12-15 ENCOUNTER — Telehealth: Payer: Self-pay

## 2015-12-15 ENCOUNTER — Other Ambulatory Visit: Payer: Self-pay

## 2015-12-15 MED ORDER — AZELASTINE-FLUTICASONE 137-50 MCG/ACT NA SUSP
1.0000 | Freq: Two times a day (BID) | NASAL | 3 refills | Status: DC
Start: 2015-12-15 — End: 2016-01-06

## 2015-12-15 NOTE — Telephone Encounter (Signed)
Patient was given an [place] sample on 10/25/15 -Dr. Ernst Bowler and she thinks its working great. [facility]. Patient stated this medication may need a prior auth.   Please Advise

## 2015-12-15 NOTE — Telephone Encounter (Signed)
Sent in refill

## 2015-12-27 ENCOUNTER — Other Ambulatory Visit: Payer: Self-pay | Admitting: Internal Medicine

## 2015-12-27 DIAGNOSIS — Z1231 Encounter for screening mammogram for malignant neoplasm of breast: Secondary | ICD-10-CM

## 2015-12-29 ENCOUNTER — Telehealth: Payer: Self-pay | Admitting: Allergy & Immunology

## 2015-12-29 MED ORDER — BUDESONIDE 180 MCG/ACT IN AEPB: INHALATION_SPRAY | RESPIRATORY_TRACT | 5 refills | Status: DC

## 2015-12-29 NOTE — Telephone Encounter (Signed)
[facility] and pt made aware.

## 2015-12-29 NOTE — Telephone Encounter (Signed)
Pt called and said that she needs the [facility] and be sent now because she is almost out. [phone].

## 2015-12-30 ENCOUNTER — Telehealth: Payer: Self-pay | Admitting: Allergy & Immunology

## 2015-12-30 MED ORDER — BUDESONIDE 180 MCG/ACT IN AEPB: INHALATION_SPRAY | RESPIRATORY_TRACT | 0 refills | Status: DC

## 2015-12-30 NOTE — Telephone Encounter (Signed)
Can you send in a prescription for her Pulmocort to [facility]? Her mail order wont get here before the weekend and she wont have enough to get her through the weekend.

## 2015-12-30 NOTE — Telephone Encounter (Signed)
Left message to let her know the her medication has been sent to the pharmacy.

## 2016-01-03 ENCOUNTER — Telehealth: Payer: Self-pay | Admitting: Allergy & Immunology

## 2016-01-03 NOTE — Telephone Encounter (Signed)
[facility] called to verify a quantity for this patient for Pulmicort. Reference [id number]. Phone # [phone].

## 2016-01-04 NOTE — Telephone Encounter (Signed)
Spoke with [facility] and authorized 90 day supply for patient's medication.

## 2016-01-05 ENCOUNTER — Telehealth: Payer: Self-pay | Admitting: Allergy & Immunology

## 2016-01-05 NOTE — Telephone Encounter (Signed)
She is supposed to use it 2 puffs BID. Is this change appropriate or can we send her something new?

## 2016-01-05 NOTE — Telephone Encounter (Signed)
Reviewed chart. If she is doing fine on one puff once daily, that is fine, but she can increase back to two puffs twice daily when she has respiratory illnesses or has increased coughing/wheezing. Maybe to make sure that she has enough of a supply in case she needs to quadruple the dose, we can change the prescription to one puff twice daily. Does that make sense?  I don't know why [facility] would not be able to fill it, however. Is Pulmicort on backorder?   Salvatore Marvel, MD [facility]

## 2016-01-05 NOTE — Telephone Encounter (Signed)
She is on [facility] and they are telling her that they cant send her the Pulmocort like they usually do in a three month supply. They can't send her that much now. She has reduced the number of times she is using it from 4 to 1 to make it stretch out longer and says that she has been fine just using 1. She wants to know if we can fix this pharmacy problem. She says that the pharmacy told her that they would be contacting us about it.

## 2016-01-06 ENCOUNTER — Ambulatory Visit (INDEPENDENT_AMBULATORY_CARE_PROVIDER_SITE_OTHER): Payer: Medicare Other | Admitting: Nurse Practitioner

## 2016-01-06 ENCOUNTER — Encounter: Payer: Self-pay | Admitting: Nurse Practitioner

## 2016-01-06 VITALS — BP 148/88 | HR 72 | Ht 63.25 in | Wt 134.6 lb

## 2016-01-06 DIAGNOSIS — Z23 Encounter for immunization: Secondary | ICD-10-CM | POA: Diagnosis not present

## 2016-01-06 DIAGNOSIS — G250 Essential tremor: Secondary | ICD-10-CM | POA: Diagnosis not present

## 2016-01-06 MED ORDER — ALPRAZOLAM 0.25 MG PO TABS: 0.2500 mg | ORAL_TABLET | Freq: Every day | ORAL | 1 refills | Status: DC | PRN

## 2016-01-06 NOTE — Patient Instructions (Signed)
Continue Xanax daily if needed will refill Continue water aerobics 3 times weekly F/U yearly

## 2016-01-06 NOTE — Progress Notes (Signed)
Christina Lawson  PATIENT: Christina Lawson DOB: 03/11/1924   REASON FOR VISIT: follow up for essential tremor HISTORY FROM:patient    HISTORY OF PRESENT ILLNESS:Christina Lawson is an 82 year old right-handed white female with a history of an essential tremor affecting mainly the upper extremities. The patient has done very well over the last one year, using alprazolam if needed for the tremor. The patient otherwise has not used any medications for the tremor. The patient indicates that the essential tremor does not impact her ability to perform activities of daily living. The patient has some sloppy handwriting, but her handwriting is legible. The patient is not having excessive problems with feeding herself, or using her hands otherwise. The patient has some mild gait instability, no falls have been noted. The patient notes that the tremors worse if she is upset or excited about something. The patient currently lives at [facility]. She no longer drives. She continues  water aerobics 3 times a week . The patient returns for an evaluation. She needs refills on her medication   REVIEW OF SYSTEMS: Full 14 system review of systems performed and notable only for those listed, all others are neg:  Constitutional: neg  Cardiovascular: neg Ear/Nose/Throat: Hearing loss  Skin: neg Eyes: neg Respiratory: neg Gastroitestinal: neg Hematology/Lymphatic: easy bruising  Endocrine: neg Musculoskeletal:neg Allergy/Immunology: neg Neurological: tremors Psychiatric: anxiety Sleep : neg   ALLERGIES: Allergies  Allergen Reactions  . Clindamycin/Lincomycin     diarrhea  . Ibuprofen   . Penicillins   . Seasonal Ic [Cholestatin]     HOME MEDICATIONS: Outpatient Medications Prior to Visit  Medication Sig Dispense Refill  . albuterol (PROAIR HFA) 108 (90 Base) MCG/ACT inhaler Inhale 2 puffs into the lungs every 4 (four) hours as needed for wheezing or shortness of breath. 1 Inhaler  1  . ALPRAZolam (XANAX) 0.25 MG tablet Take 1 tablet (0.25 mg total) by mouth daily. 90 tablet 1  . Azelastine-Fluticasone 137-50 MCG/ACT SUSP Place 1 puff into the nose 2 (two) times daily. 69 g 3  . Biotin 1000 MCG tablet Take 1,000 mcg by mouth daily. Reported on 04/15/2015    . budesonide (PULMICORT FLEXHALER) 180 MCG/ACT inhaler USE 2 INHALATIONS TWICE A DAY 1 Inhaler 0  . Cholecalciferol (VITAMIN D) 2000 units CAPS Take 1 capsule (2,000 Units total) by mouth daily. 30 capsule 3  . docusate sodium (COLACE) 100 MG capsule Take 200 mg by mouth daily as needed.      . loratadine (CLARITIN) 10 MG tablet Take 10 mg by mouth daily. As needed    . minoxidil (ROGAINE) 2 % external solution Apply topically 2 (two) times daily.      . mirabegron ER (MYRBETRIQ) 25 MG TB24 tablet Take 1 tablet (25 mg total) by mouth daily. (Patient not taking: Reported on 10/25/2015) 14 tablet 0  . mometasone (NASONEX) 50 MCG/ACT nasal spray Place 1 spray into the nose daily. 17 g 5  . montelukast (SINGULAIR) 10 MG tablet TAKE ONE TABLET EACH EVENING TO PREVENT COUGH OR WHEEZE. 90 tablet 1  . nystatin (MYCOSTATIN/NYSTOP) powder Apply topically 3 (three) times daily. Beneath breasts 30 g 5  . Saline (SIMPLY SALINE) 0.9 % AERS 1 spray by Nasal route 2 (two) times daily.      . traMADol (ULTRAM) 50 MG tablet Take 1 tablet (50 mg total) by mouth 3 (three) times daily as needed. 90 tablet 0  . triamcinolone cream (KENALOG) 0.1 % Apply 1 application topically 2 (two) times daily.  No facility-administered medications prior to visit.     PAST MEDICAL HISTORY: Past Medical History:  Diagnosis Date  . Allergic rhinitis    pollen and grass  . Asthma   . Blepharitis   . Breech delivery 1962  . Delivery normal 1960  . Hyperlipidemia   . Irregular heart rhythm   . Macular degeneration 2012  . Movement disorder    Tremor  . Osteoporosis   . Sensorineural hearing loss, bilateral 2007  . Shingles 2004  . Skin cancer  2005   nose  . Torn meniscus 2006   right knee  . Tremor 2006   familiar.  dx by Christina Lawson  . Unspecified essential hypertension   . Unspecified hypothyroidism   . Urinary frequency     PAST SURGICAL HISTORY: Past Surgical History:  Procedure Laterality Date  . ABDOMINAL HYSTERECTOMY  1977  . CATARACT EXTRACTION  1974   Left, IOL implants  . CATARACT EXTRACTION Right 1979   IOL implants  . CHOLECYSTECTOMY  1981  . COSMETIC SURGERY  1995   eye  . [place]  . DILATION AND CURETTAGE OF UTERUS  1977  . [place]  . HIATAL HERNIA REPAIR  1977  . SKIN CANCER EXCISION  2005   nose  . THYROIDECTOMY  1955  . TONSILLECTOMY  1931    FAMILY HISTORY: Family History  Problem Relation Age of Onset  . Emphysema Father   . Allergies Father   . Asthma Father   . Heart disease Maternal Grandfather   . Brain cancer Mother   . Cancer Mother     Brain  . Hypertension Mother   . Cancer Sister     Cervical  . Endometrial cancer Sister   . Cancer Sister     Breast, metastasized to bone  . Stroke      SOCIAL HISTORY: Social History   Social History  . Marital status: Single    Spouse name: N/A  . Number of children: 2  . Years of education: college   Occupational History  . retired Education officer, museum     retired   Social History Main Topics  . Smoking status: Never Smoker  . Smokeless tobacco: Never Used  . Alcohol use Yes     Comment: on occ.  . Drug use: No  . Sexual activity: No   Other Topics Concern  . Not on file   Social History Narrative   Lives at [facility]. Married 1958.   Never smoked   Alcohol minimal   Exercise predominantly walking, water aerobic   Retired Education officer, museum           PHYSICAL EXAM  Vitals:   01/06/16 1032  Weight: 134 lb 9.6 oz (61.1 kg)  Height: 5' 3.25" (1.607 m)   Body mass index is 23.66 kg/m. General: The patient is alert and cooperative at the time of the examination.   Skin: No significant peripheral edema is noted.  Neurologic Exam  Mental status: The patient is oriented x 3.  Cranial nerves: Facial symmetry is present. Speech is normal, no aphasia or dysarthria is noted. Extraocular movements are full. Visual fields are full. No head and neck tremor, no vocal tremor noted.  Motor: The patient has good strength in all 4 extremities.  Sensory examination: Soft touch sensation on the face, arms, and legs is symmetric.  Coordination: The patient has good finger-nose-finger and heel-to-shin bilaterally. A mild intention tremor  seen with finger-nose-finger.  Gait and station: The patient has a normal gait. Tandem gait is minimally unsteady. Romberg is negative. No drift is seen. No assistive device Reflexes: Deep tendon reflexes are symmetric.    DIAGNOSTIC DATA (LABS, IMAGING, TESTING) - I reviewed patient records, labs, notes, testing and imaging myself where available.  Lab Results  Component Value Date   WBC 4.6 09/21/2015   HGB 14.1 09/21/2015   HCT 43 09/21/2015   PLT 230 09/21/2015      Component Value Date/Time   NA 144 09/21/2015   K 4.6 09/21/2015   BUN 14 09/21/2015   CREATININE 0.7 09/21/2015   AST 20 09/21/2015   ALT 17 09/21/2015   ALKPHOS 53 09/21/2015   Lab Results  Component Value Date   CHOL 179 09/21/2015   HDL 61 09/21/2015   LDLCALC 101 09/21/2015   TRIG 88 09/21/2015   Lab Results  Component Value Date   HGBA1C 5.7 09/21/2015    Lab Results  Component Value Date   TSH 4.40 09/21/2015      ASSESSMENT AND PLAN  82 y.o. year old female  here to follow-up for essential tremor.  Continue Xanax daily if needed will refill Continue water aerobics 3 times weekly F/U yearly Dennie Bible, [facility], [facility], APRN  [facility] [address], [place], [place] [address] [phone]

## 2016-01-06 NOTE — Progress Notes (Signed)
I have read the note, and I agree with the clinical assessment and plan.  WILLIS,CHARLES KEITH   

## 2016-01-06 NOTE — Progress Notes (Signed)
Fax confirmation xanax [facility]. [phone]. ssy

## 2016-01-11 ENCOUNTER — Telehealth: Payer: Self-pay | Admitting: Allergy & Immunology

## 2016-01-11 NOTE — Telephone Encounter (Signed)
Per the previous telephone note, I am fine with daily Pulmicort administration (one or two puffs once daily). I thought there was a problem with [facility] covering the medication or something? See previous telephone notes.  Thanks Salvatore Marvel, MD [place] [facility] [place]

## 2016-01-11 NOTE — Telephone Encounter (Signed)
She called wanting to know if you would consider redoseing her medication to 2 daily, since she was doing well taking less? Please advise.

## 2016-01-11 NOTE — Telephone Encounter (Signed)
Pt is aware. She did receive her Pulmicort today in the mail. She is unsure of why [facility] sent the original message.

## 2016-01-11 NOTE — Telephone Encounter (Signed)
Pt advised. She states that she did get her medication today. She is not sure why [facility] said it was not being covered, but then mailed the rx.

## 2016-01-11 NOTE — Telephone Encounter (Signed)
Thanks, Museum/gallery conservator!   Salvatore Marvel, MD [place] [facility] [place]

## 2016-02-08 ENCOUNTER — Ambulatory Visit
Admission: RE | Admit: 2016-02-08 | Discharge: 2016-02-08 | Disposition: A | Discharge status: Home / Self Care | Payer: Medicare Other | Source: Ambulatory Visit | Attending: Internal Medicine | Admitting: Internal Medicine

## 2016-02-08 DIAGNOSIS — Z1231 Encounter for screening mammogram for malignant neoplasm of breast: Secondary | ICD-10-CM | POA: Diagnosis not present

## 2016-02-10 ENCOUNTER — Encounter: Payer: Self-pay | Admitting: *Deleted

## 2016-03-21 ENCOUNTER — Encounter: Payer: Self-pay | Admitting: Internal Medicine

## 2016-03-21 DIAGNOSIS — E039 Hypothyroidism, unspecified: Secondary | ICD-10-CM | POA: Diagnosis not present

## 2016-03-21 LAB — TSH: TSH: 4.45 u[IU]/mL (ref 0.41–5.90)

## 2016-03-29 ENCOUNTER — Encounter: Payer: Medicare Other | Admitting: Internal Medicine

## 2016-04-12 ENCOUNTER — Encounter: Payer: Self-pay | Admitting: Internal Medicine

## 2016-04-12 ENCOUNTER — Non-Acute Institutional Stay: Payer: Medicare Other | Admitting: Internal Medicine

## 2016-04-12 VITALS — BP 148/80 | HR 83 | Temp 97.5°F | Ht 63.0 in | Wt 136.0 lb

## 2016-04-12 DIAGNOSIS — R200 Anesthesia of skin: Secondary | ICD-10-CM

## 2016-04-12 DIAGNOSIS — R21 Rash and other nonspecific skin eruption: Secondary | ICD-10-CM | POA: Diagnosis not present

## 2016-04-12 DIAGNOSIS — I10 Essential (primary) hypertension: Secondary | ICD-10-CM

## 2016-04-12 DIAGNOSIS — E890 Postprocedural hypothyroidism: Secondary | ICD-10-CM

## 2016-04-12 DIAGNOSIS — G250 Essential tremor: Secondary | ICD-10-CM | POA: Diagnosis not present

## 2016-04-12 DIAGNOSIS — K117 Disturbances of salivary secretion: Secondary | ICD-10-CM | POA: Diagnosis not present

## 2016-04-12 NOTE — Progress Notes (Signed)
Location:  Occupational psychologist of Service:  Clinic (12)  Provider: Giacomo Valone L. Mariea Clonts, D.O., C.M.D.  Code Status: DNR Goals of Care:  Advanced Directives 04/12/2016  Does Patient Have a Medical Advance Directive? Yes  Type of Paramedic of [place];Living will;Out of facility DNR (pink MOST or yellow form)  Does patient want to make changes to medical advance directive? -  Copy of [place] in Chart? Yes  Pre-existing out of facility DNR order (yellow form or pink MOST form) Yellow form placed in chart (order not valid for inpatient use)     Chief Complaint  Patient presents with  . Medical Management of Chronic Issues    45mth follow-up    HPI: Patient is a 83 y.o. female seen today for medical management of chronic diseases. Patient concerned about several spots on her skin on her chest and legs. Has had spots on chest for 1 month and sometimes are painful, itchy, dry. Has new spot to left upper leg that is concerning. Thinks she may need to see a dermatologist.   Hair is dry, despite using conditioner, dry lips using vaseline that does not help enough.   Has tremor to both hands that is baseline, but has noticed right sided drooling that is intermittent and does not follow a pattern. Saw neurology last October.   Has concerns about breathing related to allergies and asthma. Has new medicine a vista, that is helping.   Reports intermittent numbness to left hand, thinks she may need to see cardiology. These symptoms generally happen when eating breakfast, does not hold coffee cup in hand, due to concern for dropping cup. Denies Cp.   Would like to discuss, thyroid results. Denies lethargy.   Lives in the independent living at [facility]. Is exercising 3x a week in the swimming pool. When weather is good she walks outside, and tries to walk inside. Goes to the writing workshop once a week.  Has managed to stay  healthy,despite ongoing viruses.    Past Medical History:  Diagnosis Date  . Allergic rhinitis    pollen and grass  . Asthma   . Blepharitis   . Breech delivery 1962  . Delivery normal 1960  . Hyperlipidemia   . Irregular heart rhythm   . Macular degeneration 2012  . Movement disorder    Tremor  . Osteoporosis   . Sensorineural hearing loss, bilateral 2007  . Shingles 2004  . Skin cancer 2005   nose  . Torn meniscus 2006   right knee  . Tremor 2006   familiar.  dx by Dr. Quillian Quince  . Unspecified essential hypertension   . Unspecified hypothyroidism   . Urinary frequency     Past Surgical History:  Procedure Laterality Date  . ABDOMINAL HYSTERECTOMY  1977  . CATARACT EXTRACTION  1974   Left, IOL implants  . CATARACT EXTRACTION Right 1979   IOL implants  . CHOLECYSTECTOMY  1981  . COSMETIC SURGERY  1995   eye  . [place]  . DILATION AND CURETTAGE OF UTERUS  1977  . [place]  . HIATAL HERNIA REPAIR  1977  . SKIN CANCER EXCISION  2005   nose  . THYROIDECTOMY  1955  . TONSILLECTOMY  1931    Allergies  Allergen Reactions  . Clindamycin/Lincomycin     diarrhea  . Ibuprofen   . Penicillins   . Seasonal Ic [Cholestatin]  Allergies as of 04/12/2016      Reactions   Clindamycin/lincomycin    diarrhea   Ibuprofen    Penicillins    Seasonal Ic [cholestatin]       Medication List       Accurate as of 04/12/16  9:19 AM. Always use your most recent med list.          albuterol 108 (90 Base) MCG/ACT inhaler Commonly known as:  PROAIR HFA Inhale 2 puffs into the lungs every 4 (four) hours as needed for wheezing or shortness of breath.   ALPRAZolam 0.25 MG tablet Commonly known as:  XANAX Take 1 tablet (0.25 mg total) by mouth daily as needed.   Biotin 1000 MCG tablet Take 1,000 mcg by mouth daily. Reported on 04/15/2015   budesonide 180 MCG/ACT inhaler Commonly known as:  PULMICORT FLEXHALER USE 2 INHALATIONS TWICE  A DAY   docusate sodium 100 MG capsule Commonly known as:  COLACE Take 200 mg by mouth daily as needed.   DYMISTA 137-50 MCG/ACT Susp Generic drug:  Azelastine-Fluticasone Place 1 spray into the nose 2 (two) times daily.   loratadine 10 MG tablet Commonly known as:  CLARITIN Take 10 mg by mouth daily. As needed   minoxidil 2 % external solution Commonly known as:  ROGAINE Apply topically 2 (two) times daily.   PRESERVISION AREDS 2+MULTI VIT PO Take by mouth daily.   SIMPLY SALINE 0.9 % Aers Generic drug:  Saline 1 spray by Nasal route 2 (two) times daily.   traMADol 50 MG tablet Commonly known as:  ULTRAM Take 1 tablet (50 mg total) by mouth 3 (three) times daily as needed.   Vitamin D 2000 units Caps Take 1 capsule (2,000 Units total) by mouth daily.       Review of Systems:  Review of Systems  Constitutional: Negative for activity change, appetite change, chills, fatigue and fever.  HENT: Positive for hearing loss.   Eyes:       Visual decline  Respiratory: Negative for chest tightness and shortness of breath.   Cardiovascular: Negative for chest pain, palpitations and leg swelling.  Gastrointestinal: Negative for abdominal distention and constipation.  Genitourinary: Positive for frequency. Negative for dysuria and urgency.  Musculoskeletal: Negative for gait problem.  Skin: Negative for color change.    Health Maintenance  Topic Date Due  . URINE MICROALBUMIN  11/15/1935  . TETANUS/TDAP  11/14/1944  . INFLUENZA VACCINE  Completed  . DEXA SCAN  Completed  . ZOSTAVAX  Completed  . PNA vac Low Risk Adult  Completed    Physical Exam: Vitals:   04/12/16 0854  BP: (!) 148/80  Pulse: 83  Temp: 97.5 F (36.4 C)  TempSrc: Oral  SpO2: 97%  Weight: 136 lb (61.7 kg)  Height: 5\' 3"  (1.6 m)   Body mass index is 24.09 kg/m. Physical Exam  Constitutional: She appears well-developed and well-nourished. No distress.  HENT:  Head: Normocephalic and  atraumatic.  Cardiovascular: Normal rate, regular rhythm, normal heart sounds and intact distal pulses.   Pulmonary/Chest: Effort normal and breath sounds normal. No respiratory distress.  Abdominal: Soft. Bowel sounds are normal.  Neurological: She is alert. No cranial nerve deficit.  Notes some saliva drains from right side of mouth at times ; essential tremor  Skin: Skin is warm and dry.  Multiple raised papules on chest some are tender and new; also has two flat red spots next to each other on left thigh--one old, one new  Psychiatric: She has a normal mood and affect.   Labs reviewed: Basic Metabolic Panel:  Recent Labs  09/21/15 03/21/16  NA 144  --   K 4.6  --   BUN 14  --   CREATININE 0.7  --   TSH 4.40 4.45   Liver Function Tests:  Recent Labs  09/21/15  AST 20  ALT 17  ALKPHOS 53   No results for input(s): LIPASE, AMYLASE in the last 8760 hours. No results for input(s): AMMONIA in the last 8760 hours. CBC:  Recent Labs  09/21/15  WBC 4.6  HGB 14.1  HCT 43  PLT 230   Lipid Panel:  Recent Labs  09/21/15  CHOL 179  HDL 61  LDLCALC 101  TRIG 88   Lab Results  Component Value Date   HGBA1C 5.7 09/21/2015    Assessment/Plan 1. Essential hypertension -bp elevated here today--reports that it's from watching Trump's address last night -sees Mliss Sax each Thursday for ear drops so will get her bp checked and results communicated to me  2. Tremor, essential -stable, no significant increase  3. Drooling -on right side of mouth, no visible facial droop and symmetric smile, no speech changes--I'm not certain why this happens besides dry mouth/dry eyes she's got  4. Rash and nonspecific skin eruption On chest--new and some lesions are painful--looks more like seborrheic keratoses, but advised she have derm check her -also has two lesions on left thigh  5. Postoperative hypothyroidism -TSH wnl on labs  6. Numbness of left hand -comes and goes,  feels she's not safe to hold a cup of coffee in the hand due to this, pulses intact, hand warm, all fingers are involved, ?cervical -advised that since she already sees neurology to ask them about a nerve conduction study to further evaluate the source of the paresthesias  Labs/tests ordered:  Cbc, cmp, flp, hba1c, tsh before Next appt:  6 mos AWV

## 2016-04-19 ENCOUNTER — Encounter: Payer: Self-pay | Admitting: Allergy & Immunology

## 2016-04-19 ENCOUNTER — Ambulatory Visit (INDEPENDENT_AMBULATORY_CARE_PROVIDER_SITE_OTHER): Payer: Medicare Other | Admitting: Allergy & Immunology

## 2016-04-19 VITALS — BP 144/88 | HR 70 | Temp 97.4°F | Resp 16 | Ht 60.0 in | Wt 137.0 lb

## 2016-04-19 DIAGNOSIS — J4530 Mild persistent asthma, uncomplicated: Secondary | ICD-10-CM | POA: Diagnosis not present

## 2016-04-19 DIAGNOSIS — J302 Other seasonal allergic rhinitis: Secondary | ICD-10-CM

## 2016-04-19 MED ORDER — BUDESONIDE 180 MCG/ACT IN AEPB: INHALATION_SPRAY | RESPIRATORY_TRACT | 0 refills | Status: DC

## 2016-04-19 NOTE — Progress Notes (Signed)
FOLLOW UP  Date of Service/Encounter:  04/19/16   Assessment:   Mild persistent asthma, uncomplicated  Seasonal allergic rhinitis   Asthma Reportables:  Severity: mild persistent  Risk: low Control: well controlled  Seasonal Influenza Vaccine: yes    Plan/Recommendations:   1. Mild persistent asthma, uncomplicated - Continue with Pulmicort once daily since Christina Lawson is doing so well on this.  - Stop Pulmicort in June and restart at the end of August.  - Continue with ProAir 2-4 puffs every 4-6 hours as needed. - Breathing tests look great today.   2. Allergic rhinoconjunctivitis - Continue with Dymista one spray per nostril twice daily. - Try stopping the montelukast.  - You should know within 24-48 hours if you definitely need it.  - Use Claritin (lortadine), but change to as needed.   3. Isolated elevated blood pressure  - Patient is not currently on any antihypertensive medications. - She is going to see her PCP tomorrow to discuss. - Repeat BP read was slightly more normal, although the distastolic was slightly higher.  4. Return to clinic in six months.  Subjective:   Christina Lawson is a 83 y.o. female presenting today for follow up of  Chief Complaint  Patient presents with  . Asthma    doing pretty well. feels like her breathing is not as good as it usually is, especially after exertion.     Christina Lawson has a history of the following: Patient Active Problem List   Diagnosis Date Noted  . Drooling 04/12/2016  . Hyperglycemia 03/24/2015  . Senile osteopenia 03/24/2015  . 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 Murmur, cardiac 03/24/2015  . Conjunctivitis 01/12/2014  . Edema 08/20/2013  . Senile ecchymosis 06/23/2013  . Rash and nonspecific skin eruption 03/26/2013  . Pain in left knee 01/06/2013  . Osteopenia 12/16/2012  . Urinary frequency 09/23/2012  . Impacted cerumen 09/23/2012  . Hypothyroidism 09/23/2012  . Essential  hypertension   . Hyperlipidemia   . Tremor, essential   . Cardiomegaly - hypertensive 08/10/2010  . Asthma, intrinsic 05/31/2010  . Dyspnea 05/31/2010    History obtained from: chart review and patient.  Christina Lawson was referred by Hollace Kinnier, DO.     Unice is a 83 y.o. female presenting for a follow up visit. Christina Lawson has a history of diabetes, osteoarthritis, hypothyroidism, hypertension, hypercholesterolemia, and cardiomegaly. She was last seen in August 2017. At that time, she was doing well with Pulmicort 2 puffs twice daily as well as pro-air as needed. She has a history of allergic rhinoconjunctivitis and was having increased symptoms at the last visit. I felt that her symptoms were possibly related to reflux, but she preferred to stay off of her proton pump inhibitor. Therefore, we tried Dymista one spray per nostril twice daily. I also recommended that she continue with Singulair and Claritin.  Since last visit, Christina Lawson has done very well. She has now decreased her Pulmicort one inhalation once daily without any increase in her symptoms. Her worst times of the year in the spring and the fall. She denies nighttime symptoms and has needed no prednisone since last visit. She does endorse some shortness of breath with longer walks, but this occurs around 1 time per week. It is not enough to cause her to use her rescue inhaler. The shortness of breath clears up with cessation of the walking.  Her allergic rhinitis is well controlled with Singulair daily as well as Claritin daily. She is very  happy with the [place]. She is unsure how much she pays for it. She is interested overall in decreasing her medications today.   Otherwise, there have been no changes to her past medical history, surgical history, family history, or social history.    Review of Systems: a 14-point review of systems is pertinent for what is mentioned in HPI.  Otherwise, all other systems were  negative. Constitutional: negative other than that listed in the HPI Eyes: negative other than that listed in the HPI Ears, nose, mouth, throat, and face: negative other than that listed in the HPI Respiratory: negative other than that listed in the HPI Cardiovascular: negative other than that listed in the HPI Gastrointestinal: negative other than that listed in the HPI Genitourinary: negative other than that listed in the HPI Integument: negative other than that listed in the HPI Hematologic: negative other than that listed in the HPI Musculoskeletal: negative other than that listed in the HPI Neurological: negative other than that listed in the HPI Allergy/Immunologic: negative other than that listed in the HPI    Objective:   Blood pressure (!) 144/88, pulse 70, temperature 97.4 F (36.3 C), temperature source Oral, resp. rate 16, height 5' (1.524 m), weight 137 lb (62.1 kg), SpO2 96 %. Body mass index is 26.76 kg/m.   Physical Exam:  General: Alert, interactive, in no acute distress. Cooperative with the exam. Very pleasant elderly female.  Eyes: No conjunctival injection present on the right, No conjunctival injection present on the left, PERRL bilaterally, No discharge on the right, No discharge on the left and No Horner-Trantas dots present Ears: hearing aids in placed bilaterally.  Nose/Throat: External nose within normal limits and septum midline, turbinates mildly edematous with clear discharge, post-pharynx mildly erythematous without cobblestoning in the posterior oropharynx. Tonsils 2+ without exudates Neck: Supple without thyromegaly. Lungs: Clear to auscultation without wheezing, rhonchi or rales. No increased work of breathing. CV: Normal S1/S2, no murmurs. Capillary refill <2 seconds.  Skin: Warm and dry, without lesions or rashes. Neuro:   Grossly intact. No focal deficits appreciated. Responsive to questions.   Diagnostic studies:  Spirometry: results normal  (FEV1: 1.11/75%, FVC: 1.38/68%, FEV1/FVC: 80%).    Spirometry consistent with normal pattern. Compared to the last visit, her values have not changed significantly.  Allergy Studies:  None     Salvatore Marvel, MD [place] [facility] [place]

## 2016-04-19 NOTE — Patient Instructions (Addendum)
1. Mild persistent asthma, uncomplicated - Continue with Pulmicort once daily. - Stop Pulmicort in June and restart at the end of August.  - Continue with ProAir 2-4 puffs every 4-6 hours as needed. - Breathing tests look great today.   2. Allergic rhinoconjunctivitis - Continue with Dymista one spray per nostril twice daily. - Try stopping the montelukast.  - You should know within 24-48 hours if you definitely need it.  - Use Claritin (lortadine), but change to as needed.   3. Return to clinic in six months. It was so good to see you again today!   Please inform us of any Emergency Department visits, hospitalizations, or changes in symptoms. Call us before going to the ED for breathing or allergy symptoms since we might be able to fit you in for a sick visit. Feel free to contact us anytime with any questions, problems, or concerns.  It was a pleasure to see you again today! Best wishes in the [place] Year!   Websites that have reliable patient information: 1. American Academy of Asthma, Allergy, and Immunology: www.aaaai.org 2. Food Allergy Research and Education (FARE): foodallergy.org 3. Mothers of Asthmatics: http://www.asthmacommunitynetwork.org 4. American College of Allergy, Asthma, and Immunology: www.acaai.org

## 2016-04-24 ENCOUNTER — Other Ambulatory Visit: Payer: Self-pay | Admitting: *Deleted

## 2016-04-24 MED ORDER — BUDESONIDE 180 MCG/ACT IN AEPB: 2.0000 | INHALATION_SPRAY | Freq: Two times a day (BID) | RESPIRATORY_TRACT | 1 refills | Status: DC

## 2016-04-25 ENCOUNTER — Telehealth: Payer: Self-pay | Admitting: Allergy & Immunology

## 2016-04-25 MED ORDER — BUDESONIDE 180 MCG/ACT IN AEPB: 2.0000 | INHALATION_SPRAY | Freq: Two times a day (BID) | RESPIRATORY_TRACT | 1 refills | Status: DC

## 2016-04-25 NOTE — Telephone Encounter (Signed)
Pt is requesting 3 mth supply of Pulmicort be electronically sent to [facility] instead of [facility].   Electronically resending Rx to [facility].

## 2016-04-25 NOTE — Telephone Encounter (Signed)
Patient saw Dr. Ernst Bowler on 04-19-16. He gave her a discount card for Pulmicort but she said she found out she is not eligible for this discount because she had private insurance and medicare. She would like him to authorize a 3 month supply from [facility].

## 2016-05-01 ENCOUNTER — Other Ambulatory Visit: Payer: Self-pay | Admitting: *Deleted

## 2016-05-01 MED ORDER — BUDESONIDE 180 MCG/ACT IN AEPB: 1.0000 | INHALATION_SPRAY | Freq: Every day | RESPIRATORY_TRACT | 1 refills | Status: DC

## 2016-05-06 ENCOUNTER — Other Ambulatory Visit: Payer: Self-pay | Admitting: Allergy & Immunology

## 2016-05-12 ENCOUNTER — Other Ambulatory Visit: Payer: Self-pay | Admitting: Allergy & Immunology

## 2016-05-12 NOTE — Telephone Encounter (Signed)
GALLAGHER has sent in a script for pulmicort and then patient needs 3 scripts for pul[facility]. Please call patient with any questions

## 2016-05-15 NOTE — Telephone Encounter (Signed)
Called patient and I was informed that she only received one inhaler and the script said 3 inhalers with 1 refill. I called [facility] to find out if the script was dent correctly. The script for 05/02/2015 said 3 inhaler with 1 refill. They sent out 1 inhaler and in order for the patient to received 3 inhalers at one time she would need a prior authorization. I called patient back to inform her what the Pharmacy said. Patient stated she was getting charged for 3 inhalers and she only received 1. I informed patient that she may need to speak with her insurance company.

## 2016-05-30 DIAGNOSIS — Z85828 Personal history of other malignant neoplasm of skin: Secondary | ICD-10-CM | POA: Diagnosis not present

## 2016-05-30 DIAGNOSIS — L718 Other rosacea: Secondary | ICD-10-CM | POA: Diagnosis not present

## 2016-05-30 DIAGNOSIS — L570 Actinic keratosis: Secondary | ICD-10-CM | POA: Diagnosis not present

## 2016-05-30 DIAGNOSIS — L821 Other seborrheic keratosis: Secondary | ICD-10-CM | POA: Diagnosis not present

## 2016-05-30 DIAGNOSIS — L300 Nummular dermatitis: Secondary | ICD-10-CM | POA: Diagnosis not present

## 2016-06-01 ENCOUNTER — Telehealth: Payer: Self-pay | Admitting: Nurse Practitioner

## 2016-06-01 NOTE — Telephone Encounter (Signed)
Patient has been scheduled to see Cecille Rubin 06/05/16 for left hand numbness, PCP advised patient to follow up.

## 2016-06-01 NOTE — Telephone Encounter (Signed)
lmvm for pt that called about hand numbness.

## 2016-06-05 ENCOUNTER — Encounter: Payer: Self-pay | Admitting: Nurse Practitioner

## 2016-06-05 ENCOUNTER — Ambulatory Visit (INDEPENDENT_AMBULATORY_CARE_PROVIDER_SITE_OTHER): Payer: Medicare Other | Admitting: Nurse Practitioner

## 2016-06-05 VITALS — BP 129/80 | HR 82 | Ht 60.0 in | Wt 139.0 lb

## 2016-06-05 DIAGNOSIS — G250 Essential tremor: Secondary | ICD-10-CM | POA: Diagnosis not present

## 2016-06-05 DIAGNOSIS — R200 Anesthesia of skin: Secondary | ICD-10-CM

## 2016-06-05 DIAGNOSIS — R202 Paresthesia of skin: Secondary | ICD-10-CM | POA: Diagnosis not present

## 2016-06-05 NOTE — Progress Notes (Signed)
[facility]  PATIENT: Christina Lawson DOB: May 05, 1923   REASON FOR VISIT: follow up for essential tremor, new complaint of transient numbness in her hands HISTORY FROM:patient    HISTORY OF PRESENT ILLNESS:[name] is an 83 year old right-handed white female with a history of an essential tremor affecting mainly the upper extremities. The patient has done very well  with alprazolam if needed for the tremor. She returns for follow-up today with a new complaint of numbness primarily in her left hand but also in her right hand. She is right handed,  it bothers her the most when she uses the computer. She has no history of neck injury and she denies any neck pain. She saw her primary care for this who wanted her to be seen by neurology. The patient indicates that the essential tremor does not impact her ability to perform activities of daily living. The patient has some sloppy handwriting, but her handwriting is legible. The patient is not having excessive problems with feeding herself, or using her hands otherwise. The patient has some mild gait instability, no falls have been noted. The patient notes that the tremors worse if she is upset or excited about something. The patient currently lives at [facility]. She no longer drives. She continues  water aerobics 3 times a week . She is also Geologist, engineering. The patient returns for an evaluation.   REVIEW OF SYSTEMS: Full 14 system review of systems performed and notable only for those listed, all others are neg:  Constitutional: neg  Cardiovascular: neg Ear/Nose/Throat: Hearing loss  Skin: neg Eyes: neg Respiratory: neg Gastroitestinal: neg Hematology/Lymphatic: easy bruising  Endocrine: neg Musculoskeletal:neg Allergy/Immunology: neg Neurological: tremors, numbness in wrist/hand Psychiatric: anxiety Sleep : neg   ALLERGIES: Allergies  Allergen Reactions  . Clindamycin/Lincomycin     diarrhea  . Ibuprofen     . Penicillins     HOME MEDICATIONS: Outpatient Medications Prior to Visit  Medication Sig Dispense Refill  . albuterol (PROAIR HFA) 108 (90 Base) MCG/ACT inhaler Inhale 2 puffs into the lungs every 4 (four) hours as needed for wheezing or shortness of breath. 1 Inhaler 1  . ALPRAZolam (XANAX) 0.25 MG tablet Take 1 tablet (0.25 mg total) by mouth daily as needed. 90 tablet 1  . Azelastine-Fluticasone (DYMISTA) 137-50 MCG/ACT SUSP Place 1 spray into the nose 2 (two) times daily.    . Biotin 1000 MCG tablet Take 1,000 mcg by mouth daily. Reported on 04/15/2015    . budesonide (PULMICORT FLEXHALER) 180 MCG/ACT inhaler Inhale 1 puff into the lungs daily. 3 Inhaler 1  . Cholecalciferol (VITAMIN D) 2000 units CAPS Take 1 capsule (2,000 Units total) by mouth daily. 30 capsule 3  . Diclofenac Sodium 1 % CREA Place 1 application onto the skin as needed.    . docusate sodium (COLACE) 100 MG capsule Take 200 mg by mouth daily as needed.      . loratadine (CLARITIN) 10 MG tablet Take 10 mg by mouth daily. As needed    . minoxidil (ROGAINE) 2 % external solution Apply topically 2 (two) times daily.      . montelukast (SINGULAIR) 10 MG tablet TAKE 1 TABLET EVERY EVENING TO PREVENT COUGH OR WHEEZE. 90 tablet 1  . Saline (SIMPLY SALINE) 0.9 % AERS 1 spray by Nasal route 2 (two) times daily.      . traMADol (ULTRAM) 50 MG tablet Take 1 tablet (50 mg total) by mouth 3 (three) times daily as needed. 90 tablet 0  .  Multiple Vitamins-Minerals (PRESERVISION AREDS 2+MULTI VIT PO) Take by mouth daily.     No facility-administered medications prior to visit.     PAST MEDICAL HISTORY: Past Medical History:  Diagnosis Date  . Allergic rhinitis    pollen and grass  . Asthma   . Blepharitis   . Breech delivery 1962  . Delivery normal 1960  . Hyperlipidemia   . Irregular heart rhythm   . Macular degeneration 2012  . Movement disorder    Tremor  . Osteoporosis   . Sensorineural hearing loss, bilateral 2007   . Shingles 2004  . Skin cancer 2005   nose  . Torn meniscus 2006   right knee  . Tremor 2006   familiar.  dx by Dr. Quillian Quince  . Unspecified essential hypertension   . Unspecified hypothyroidism   . Urinary frequency     PAST SURGICAL HISTORY: Past Surgical History:  Procedure Laterality Date  . ABDOMINAL HYSTERECTOMY  1977  . CATARACT EXTRACTION  1974   Left, IOL implants  . CATARACT EXTRACTION Right 1979   IOL implants  . CHOLECYSTECTOMY  1981  . COSMETIC SURGERY  1995   eye  . [place]  . DILATION AND CURETTAGE OF UTERUS  1977  . [place]  . HIATAL HERNIA REPAIR  1977  . SKIN CANCER EXCISION  2005   nose  . THYROIDECTOMY  1955  . TONSILLECTOMY  1931    FAMILY HISTORY: Family History  Problem Relation Age of Onset  . Emphysema Father   . Allergies Father   . Asthma Father   . Brain cancer Mother   . Cancer Mother     Brain  . Hypertension Mother   . Cancer Sister     Cervical  . Endometrial cancer Sister   . Cancer Sister     Breast, metastasized to bone  . Heart disease Maternal Grandfather   . Stroke      SOCIAL HISTORY: Social History   Social History  . Marital status: Single    Spouse name: N/A  . Number of children: 2  . Years of education: college   Occupational History  . retired Education officer, museum     retired   Social History Main Topics  . Smoking status: Never Smoker  . Smokeless tobacco: Never Used  . Alcohol use Yes     Comment: on occ.  . Drug use: No  . Sexual activity: No   Other Topics Concern  . Not on file   Social History Narrative   Lives at [place]. Married 1958.   Never smoked   Alcohol minimal   Exercise predominantly walking, water aerobic   Retired Education officer, museum           PHYSICAL EXAM  Vitals:   06/05/16 1257  BP: 129/80  Pulse: 82  Weight: 139 lb (63 kg)  Height: 5' (1.524 m)   Body mass index is 27.15 kg/m. General: The patient is alert and  cooperative at the time of the examination.  Skin: No significant peripheral edema is noted.  Neurologic Exam  Mental status: The patient is oriented x 3.  Cranial nerves: Facial symmetry is present. Speech is normal, no aphasia or dysarthria is noted. Extraocular movements are full. Visual fields are full. No head and neck tremor, no vocal tremor noted.  Motor: The patient has good strength in all 4 extremities.  Sensory examination: Soft touch sensation on the face, arms,  and legs is symmetric. Positive tinels more on the right than the left.  Coordination: The patient has good finger-nose-finger and heel-to-shin bilaterally. A mild intention tremor seen with finger-nose-finger.  Gait and station: The patient has a normal gait. Tandem gait is minimally unsteady. Romberg is negative. No drift is seen. No assistive device Reflexes: Deep tendon reflexes are symmetric.    DIAGNOSTIC DATA (LABS, IMAGING, TESTING) - I reviewed patient records, labs, notes, testing and imaging myself where available.  Lab Results  Component Value Date   WBC 4.6 09/21/2015   HGB 14.1 09/21/2015   HCT 43 09/21/2015   PLT 230 09/21/2015      Component Value Date/Time   NA 144 09/21/2015   K 4.6 09/21/2015   BUN 14 09/21/2015   CREATININE 0.7 09/21/2015   AST 20 09/21/2015   ALT 17 09/21/2015   ALKPHOS 53 09/21/2015   Lab Results  Component Value Date   CHOL 179 09/21/2015   HDL 61 09/21/2015   LDLCALC 101 09/21/2015   TRIG 88 09/21/2015   Lab Results  Component Value Date   HGBA1C 5.7 09/21/2015    Lab Results  Component Value Date   TSH 4.45 03/21/2016      ASSESSMENT AND PLAN  83 y.o. year old female  here to follow-up for essential tremor And new complaint of numbness in the left and right hand.  Continue Xanax daily if needed  Continue water aerobics 3 times weekly Will order [place]/EMG to r/o CTS Given information on carpal tunnel syndrome F/U as planned I spent 9min in  total face to face time with the patient more than 50% of which was spent counseling and coordination of care, reviewing test results reviewing medications and discussing and reviewing the diagnosis of CTS  and further treatment options. , Rayburn Ma, [facility], APRN  [facility] [address] [place], [place] [address] [phone]

## 2016-06-05 NOTE — Patient Instructions (Addendum)
Continue Xanax daily if needed will refill Continue water aerobics 3 times weekly Will order [place]/EMG to r/o CTS F/U as planned   Carpal Tunnel Syndrome Carpal tunnel syndrome is a condition that causes pain in your hand and arm. The carpal tunnel is a narrow area located on the palm side of your wrist. Repeated wrist motion or certain diseases may cause swelling within the tunnel. This swelling pinches the main nerve in the wrist (median nerve). What are the causes? This condition may be caused by:  Repeated wrist motions.  Wrist injuries.  Arthritis.  A cyst or tumor in the carpal tunnel.  Fluid buildup during pregnancy. Sometimes the cause of this condition is not known. What increases the risk? This condition is more likely to develop in:  People who have jobs that cause them to repeatedly move their wrists in the same motion, such as Art gallery manager.  Women.  People with certain conditions, such as:  Diabetes.  Obesity.  An underactive thyroid (hypothyroidism).  Kidney failure. What are the signs or symptoms? Symptoms of this condition include:  A tingling feeling in your fingers, especially in your thumb, index, and middle fingers.  Tingling or numbness in your hand.  An aching feeling in your entire arm, especially when your wrist and elbow are bent for long periods of time.  Wrist pain that goes up your arm to your shoulder.  Pain that goes down into your palm or fingers.  A weak feeling in your hands. You may have trouble grabbing and holding items. Your symptoms may feel worse during the night. How is this diagnosed? This condition is diagnosed with a medical history and physical exam. You may also have tests, including:  An electromyogram (EMG). This test measures electrical signals sent by your nerves into the muscles.  X-rays. How is this treated? Treatment for this condition includes:  Lifestyle changes. It is important to stop doing or  modify the activity that caused your condition.  Physical or occupational therapy.  Medicines for pain and inflammation. This may include medicine that is injected into your wrist.  A wrist splint.  Surgery. Follow these instructions at home: If you have a splint:   Wear it as told by your health care provider. Remove it only as told by your health care provider.  Loosen the splint if your fingers become numb and tingle, or if they turn cold and blue.  Keep the splint clean and dry. General instructions   Take over-the-counter and prescription medicines only as told by your health care provider.  Rest your wrist from any activity that may be causing your pain. If your condition is work related, talk to your employer about changes that can be made, such as getting a wrist pad to use while typing.  If directed, apply ice to the painful area:  Put ice in a plastic bag.  Place a towel between your skin and the bag.  Leave the ice on for 20 minutes, 2-3 times per day.  Keep all follow-up visits as told by your health care provider. This is important.  Do any exercises as told by your health care provider, physical therapist, or occupational therapist. Contact a health care provider if:  You have new symptoms.  Your pain is not controlled with medicines.  Your symptoms get worse. This information is not intended to replace advice given to you by your health care provider. Make sure you discuss any questions you have with your health care  provider. Document Released: 02/25/2000 Document Revised: 07/08/2015 Document Reviewed: 07/15/2014 Elsevier Interactive Patient Education  2017 [employer].

## 2016-06-05 NOTE — Progress Notes (Signed)
I have read the note, and I agree with the clinical assessment and plan.  Anwyn Kriegel KEITH   

## 2016-06-27 DIAGNOSIS — L570 Actinic keratosis: Secondary | ICD-10-CM | POA: Diagnosis not present

## 2016-06-27 DIAGNOSIS — L658 Other specified nonscarring hair loss: Secondary | ICD-10-CM | POA: Diagnosis not present

## 2016-07-03 ENCOUNTER — Ambulatory Visit (INDEPENDENT_AMBULATORY_CARE_PROVIDER_SITE_OTHER): Payer: Medicare Other | Admitting: Neurology

## 2016-07-03 ENCOUNTER — Ambulatory Visit (INDEPENDENT_AMBULATORY_CARE_PROVIDER_SITE_OTHER): Payer: Self-pay | Admitting: Neurology

## 2016-07-03 ENCOUNTER — Encounter: Payer: Self-pay | Admitting: Neurology

## 2016-07-03 ENCOUNTER — Telehealth: Payer: Self-pay | Admitting: *Deleted

## 2016-07-03 DIAGNOSIS — G5603 Carpal tunnel syndrome, bilateral upper limbs: Secondary | ICD-10-CM

## 2016-07-03 DIAGNOSIS — R202 Paresthesia of skin: Secondary | ICD-10-CM

## 2016-07-03 DIAGNOSIS — G250 Essential tremor: Secondary | ICD-10-CM

## 2016-07-03 DIAGNOSIS — R200 Anesthesia of skin: Secondary | ICD-10-CM

## 2016-07-03 HISTORY — DX: Carpal tunnel syndrome, bilateral upper limbs: G56.03

## 2016-07-03 NOTE — Procedures (Signed)
     HISTORY:  Christina Lawson is a 83 year old patient with a several month history of left greater than right hand numbness. The patient denies any neck pain or pain down the arms on either side. She is being evaluated for a possible neuropathy or a cervical radiculopathy.  NERVE CONDUCTION STUDIES:  Nerve conduction studies were performed on both upper extremities. The distal motor latencies and motor amplitudes for the ulnar nerves were normal bilaterally. The distal motor latencies for the median nerves were prolonged bilaterally with normal motor amplitudes for these nerves bilaterally. The F wave latencies and nerve conduction velocities for the median and ulnar nerves were normal bilaterally. The sensory latencies for the median nerves were prolonged bilaterally, normal for the ulnar nerves bilaterally.  EMG STUDIES:  EMG study was performed on the left upper extremity:  The first dorsal interosseous muscle reveals 2 to 4 K units with full recruitment. No fibrillations or positive waves were noted. The abductor pollicis brevis muscle reveals 2 to 4 K units with full recruitment. No fibrillations or positive waves were noted. The extensor indicis proprius muscle reveals 1 to 3 K units with full recruitment. No fibrillations or positive waves were noted. The pronator teres muscle reveals 2 to 3 K units with full recruitment. No fibrillations or positive waves were noted. The biceps muscle reveals 1 to 2 K units with full recruitment. No fibrillations or positive waves were noted. The triceps muscle reveals 2 to 4 K units with full recruitment. No fibrillations or positive waves were noted. The anterior deltoid muscle reveals 2 to 3 K units with full recruitment. No fibrillations or positive waves were noted. The cervical paraspinal muscles were tested at 2 levels. No abnormalities of insertional activity were seen at either level tested. There was good  relaxation.   IMPRESSION:  Nerve conduction studies done on both upper extremities shows evidence of bilateral mild carpal tunnel syndrome. EMG evaluation of the left upper extremity was notable for chronic stable signs of denervation in the APB muscle consistent with carpal tunnel syndrome, there is no evidence of an overlying cervical radiculopathy.  Jill Alexanders MD 07/03/2016 1:43 PM  [facility] [address] [place], [place] [address]  Phone [phone] Fax [phone]

## 2016-07-03 NOTE — Telephone Encounter (Signed)
Per Daun Peacock, NP LVM for patient informing her that her nerve conduction EMG shows evidence of mild bilateral carpal tunnel syndrome. Advised her that Hoyle Sauer stated at her visit she can wear wrist splints.  Repeated these results and advice, and left number for questions.

## 2016-07-03 NOTE — Progress Notes (Signed)
Please refer to EMG and nerve conduction study procedure note. 

## 2016-07-03 NOTE — Progress Notes (Signed)
EMG and nerve conduction study evaluation done today shows evidence of bilateral mild carpal tunnel syndrome. The patient was given a prescription for bilateral wrist splints, she is aware this night and day as much is possible over the next 6 weeks, then convert to wearing the splints at nighttime. She will call if the symptoms worsen or persist.

## 2016-07-10 ENCOUNTER — Encounter: Payer: Self-pay | Admitting: *Deleted

## 2016-07-10 NOTE — Progress Notes (Signed)
Received fax request from [place] ([place]) to send OV notes from 07/03/16 FX:OVANV splints for insurance auth. Sent notes as requested to fax: [phone]. Received confirmation.

## 2016-07-18 ENCOUNTER — Other Ambulatory Visit: Payer: Self-pay | Admitting: *Deleted

## 2016-07-18 DIAGNOSIS — M25572 Pain in left ankle and joints of left foot: Secondary | ICD-10-CM

## 2016-07-18 MED ORDER — TRAMADOL HCL 50 MG PO TABS: 50.0000 mg | ORAL_TABLET | Freq: Three times a day (TID) | ORAL | 0 refills | Status: DC | PRN

## 2016-07-18 NOTE — Telephone Encounter (Signed)
Patient requested to be faxed to [facility] 

## 2016-08-10 DIAGNOSIS — H353132 Nonexudative age-related macular degeneration, bilateral, intermediate dry stage: Secondary | ICD-10-CM | POA: Diagnosis not present

## 2016-08-10 DIAGNOSIS — H02102 Unspecified ectropion of right lower eyelid: Secondary | ICD-10-CM | POA: Diagnosis not present

## 2016-08-10 DIAGNOSIS — H5203 Hypermetropia, bilateral: Secondary | ICD-10-CM | POA: Diagnosis not present

## 2016-08-10 DIAGNOSIS — Z961 Presence of intraocular lens: Secondary | ICD-10-CM | POA: Diagnosis not present

## 2016-09-19 ENCOUNTER — Non-Acute Institutional Stay: Payer: Medicare Other

## 2016-09-19 VITALS — BP 120/78 | HR 81 | Temp 98.4°F | Ht 60.0 in | Wt 137.0 lb

## 2016-09-19 DIAGNOSIS — Z0000 Encounter for general adult medical examination without abnormal findings: Secondary | ICD-10-CM | POA: Diagnosis not present

## 2016-09-19 MED ORDER — ZOSTER VAC RECOMB ADJUVANTED 50 MCG/0.5ML IM SUSR: 0.5000 mL | Freq: Once | INTRAMUSCULAR | 1 refills | Status: AC

## 2016-09-19 NOTE — Patient Instructions (Addendum)
Christina Lawson , Thank you for taking time to come for your Medicare Wellness Visit. I appreciate your ongoing commitment to your health goals. Please review the following plan we discussed and let me know if I can assist you in the future.   Screening recommendations/referrals: Colonoscopy up to date, pt over age 81 Mammogram up to date, pt over age 14 Bone Density up to date Recommended yearly ophthalmology/optometry visit for glaucoma screening and checkup Recommended yearly dental visit for hygiene and checkup  Vaccinations: Influenza vaccine up to date. Due 01/05/17 Pneumococcal vaccine up to date Tdap vaccine due per our records. Check with [facility] to see if you received it there Shingles vaccine due, prescription sent to pharmacy  Advanced directives: In Chart  Conditions/risks identified: None  Next appointment: Dr. Mariea Clonts 10/04/16 @ 10am   Preventive Care 65 Years and Older, Female Preventive care refers to lifestyle choices and visits with your health care provider that can promote health and wellness. What does preventive care include?  A yearly physical exam. This is also called an annual well check.  Dental exams once or twice a year.  Routine eye exams. Ask your health care provider how often you should have your eyes checked.  Personal lifestyle choices, including:  Daily care of your teeth and gums.  Regular physical activity.  Eating a healthy diet.  Avoiding tobacco and drug use.  Limiting alcohol use.  Practicing safe sex.  Taking low-dose aspirin every day.  Taking vitamin and mineral supplements as recommended by your health care provider. What happens during an annual well check? The services and screenings done by your health care provider during your annual well check will depend on your age, overall health, lifestyle risk factors, and family history of disease. Counseling  Your health care provider may ask you questions about  your:  Alcohol use.  Tobacco use.  Drug use.  Emotional well-being.  Home and relationship well-being.  Sexual activity.  Eating habits.  History of falls.  Memory and ability to understand (cognition).  Work and work Statistician.  Reproductive health. Screening  You may have the following tests or measurements:  Height, weight, and BMI.  Blood pressure.  Lipid and cholesterol levels. These may be checked every 5 years, or more frequently if you are over 56 years old.  Skin check.  Lung cancer screening. You may have this screening every year starting at age 55 if you have a 30-pack-year history of smoking and currently smoke or have quit within the past 15 years.  Fecal occult blood test (FOBT) of the stool. You may have this test every year starting at age 32.  Flexible sigmoidoscopy or colonoscopy. You may have a sigmoidoscopy every 5 years or a colonoscopy every 10 years starting at age 54.  Hepatitis C blood test.  Hepatitis B blood test.  Sexually transmitted disease (STD) testing.  Diabetes screening. This is done by checking your blood sugar (glucose) after you have not eaten for a while (fasting). You may have this done every 1-3 years.  Bone density scan. This is done to screen for osteoporosis. You may have this done starting at age 50.  Mammogram. This may be done every 1-2 years. Talk to your health care provider about how often you should have regular mammograms. Talk with your health care provider about your test results, treatment options, and if necessary, the need for more tests. Vaccines  Your health care provider may recommend certain vaccines, such as:  Influenza  vaccine. This is recommended every year.  Tetanus, diphtheria, and acellular pertussis (Tdap, Td) vaccine. You may need a Td booster every 10 years.  Zoster vaccine. You may need this after age 32.  Pneumococcal 13-valent conjugate (PCV13) vaccine. One dose is recommended  after age 67.  Pneumococcal polysaccharide (PPSV23) vaccine. One dose is recommended after age 31. Talk to your health care provider about which screenings and vaccines you need and how often you need them. This information is not intended to replace advice given to you by your health care provider. Make sure you discuss any questions you have with your health care provider. Document Released: 03/26/2015 Document Revised: 11/17/2015 Document Reviewed: 12/29/2014 Elsevier Interactive Patient Education  2017 [place] Prevention in the Home Falls can cause injuries. They can happen to people of all ages. There are many things you can do to make your home safe and to help prevent falls. What can I do on the outside of my home?  Regularly fix the edges of walkways and driveways and fix any cracks.  Remove anything that might make you trip as you walk through a door, such as a raised step or threshold.  Trim any bushes or trees on the path to your home.  Use bright outdoor lighting.  Clear any walking paths of anything that might make someone trip, such as rocks or tools.  Regularly check to see if handrails are loose or broken. Make sure that both sides of any steps have handrails.  Any raised decks and porches should have guardrails on the edges.  Have any leaves, snow, or ice cleared regularly.  Use sand or salt on walking paths during winter.  Clean up any spills in your garage right away. This includes oil or grease spills. What can I do in the bathroom?  Use night lights.  Install grab bars by the toilet and in the tub and shower. Do not use towel bars as grab bars.  Use non-skid mats or decals in the tub or shower.  If you need to sit down in the shower, use a plastic, non-slip stool.  Keep the floor dry. Clean up any water that spills on the floor as soon as it happens.  Remove soap buildup in the tub or shower regularly.  Attach bath mats securely with  double-sided non-slip rug tape.  Do not have throw rugs and other things on the floor that can make you trip. What can I do in the bedroom?  Use night lights.  Make sure that you have a light by your bed that is easy to reach.  Do not use any sheets or blankets that are too big for your bed. They should not hang down onto the floor.  Have a firm chair that has side arms. You can use this for support while you get dressed.  Do not have throw rugs and other things on the floor that can make you trip. What can I do in the kitchen?  Clean up any spills right away.  Avoid walking on wet floors.  Keep items that you use a lot in easy-to-reach places.  If you need to reach something above you, use a strong step stool that has a grab bar.  Keep electrical cords out of the way.  Do not use floor polish or wax that makes floors slippery. If you must use wax, use non-skid floor wax.  Do not have throw rugs and other things on the floor that can  make you trip. What can I do with my stairs?  Do not leave any items on the stairs.  Make sure that there are handrails on both sides of the stairs and use them. Fix handrails that are broken or loose. Make sure that handrails are as long as the stairways.  Check any carpeting to make sure that it is firmly attached to the stairs. Fix any carpet that is loose or worn.  Avoid having throw rugs at the top or bottom of the stairs. If you do have throw rugs, attach them to the floor with carpet tape.  Make sure that you have a light switch at the top of the stairs and the bottom of the stairs. If you do not have them, ask someone to add them for you. What else can I do to help prevent falls?  Wear shoes that:  Do not have high heels.  Have rubber bottoms.  Are comfortable and fit you well.  Are closed at the toe. Do not wear sandals.  If you use a stepladder:  Make sure that it is fully opened. Do not climb a closed stepladder.  Make  sure that both sides of the stepladder are locked into place.  Ask someone to hold it for you, if possible.  Clearly mark and make sure that you can see:  Any grab bars or handrails.  First and last steps.  Where the edge of each step is.  Use tools that help you move around (mobility aids) if they are needed. These include:  Canes.  Walkers.  Scooters.  Crutches.  Turn on the lights when you go into a dark area. Replace any light bulbs as soon as they burn out.  Set up your furniture so you have a clear path. Avoid moving your furniture around.  If any of your floors are uneven, fix them.  If there are any pets around you, be aware of where they are.  Review your medicines with your doctor. Some medicines can make you feel dizzy. This can increase your chance of falling. Ask your doctor what other things that you can do to help prevent falls. This information is not intended to replace advice given to you by your health care provider. Make sure you discuss any questions you have with your health care provider. Document Released: 12/24/2008 Document Revised: 08/05/2015 Document Reviewed: 04/03/2014 Elsevier Interactive Patient Education  2017 [employer].

## 2016-09-19 NOTE — Progress Notes (Addendum)
Subjective:   Christina Lawson is a 83 y.o. female who presents for Medicare Annual (Subsequent) preventive examination at [facility] living pt  Previous AWV- 09/29/2015    Objective:     Vitals: BP 120/78 (BP Location: Right Arm, Patient Position: Sitting)   Pulse 81   Temp 98.4 F (36.9 C) (Oral)   Ht 5' (1.524 m)   Wt 137 lb (62.1 kg)   SpO2 95%   BMI 26.76 kg/m   Body mass index is 26.76 kg/m.   Tobacco History  Smoking Status  . Never Smoker  Smokeless Tobacco  . Never Used     Counseling given: Not Answered   Past Medical History:  Diagnosis Date  . Allergic rhinitis    pollen and grass  . Asthma   . Blepharitis   . Breech delivery 1962  . Carpal tunnel syndrome, bilateral 07/03/2016  . Delivery normal 1960  . Hyperlipidemia   . Irregular heart rhythm   . Macular degeneration 2012  . Movement disorder    Tremor  . Osteoporosis   . Sensorineural hearing loss, bilateral 2007  . Shingles 2004  . Skin cancer 2005   nose  . Torn meniscus 2006   right knee  . Tremor 2006   familiar.  dx by Dr. Quillian Quince  . Unspecified essential hypertension   . Unspecified hypothyroidism   . Urinary frequency    Past Surgical History:  Procedure Laterality Date  . ABDOMINAL HYSTERECTOMY  1977  . CATARACT EXTRACTION  1974   Left, IOL implants  . CATARACT EXTRACTION Right 1979   IOL implants  . CHOLECYSTECTOMY  1981  . COSMETIC SURGERY  1995   eye  . [place]  . DILATION AND CURETTAGE OF UTERUS  1977  . [place]  . HIATAL HERNIA REPAIR  1977  . SKIN CANCER EXCISION  2005   nose  . THYROIDECTOMY  1955  . TONSILLECTOMY  1931   Family History  Problem Relation Age of Onset  . Emphysema Father   . Allergies Father   . Asthma Father   . Brain cancer Mother   . Cancer Mother        Brain  . Hypertension Mother   . Cancer Sister        Cervical  . Endometrial cancer Sister   . Cancer Sister    Breast, metastasized to bone  . Heart disease Maternal Grandfather   . Stroke Unknown    History  Sexual Activity  . Sexual activity: No    Outpatient Encounter Prescriptions as of 09/19/2016  Medication Sig  . albuterol (PROAIR HFA) 108 (90 Base) MCG/ACT inhaler Inhale 2 puffs into the lungs every 4 (four) hours as needed for wheezing or shortness of breath.  . ALPRAZolam (XANAX) 0.25 MG tablet Take 1 tablet (0.25 mg total) by mouth daily as needed.  . Azelastine-Fluticasone (DYMISTA) 137-50 MCG/ACT SUSP Place 1 spray into the nose 2 (two) times daily.  . Biotin 1000 MCG tablet Take 1,000 mcg by mouth daily. Reported on 04/15/2015  . budesonide (PULMICORT FLEXHALER) 180 MCG/ACT inhaler Inhale 1 puff into the lungs daily.  . calcium carbonate (TUMS - DOSED IN MG ELEMENTAL CALCIUM) 500 MG chewable tablet Chew 1 tablet by mouth daily.  . Cholecalciferol (VITAMIN D) 2000 units CAPS Take 1 capsule (2,000 Units total) by mouth daily.  . Diclofenac Sodium 1 % CREA Place 1 application onto the skin as needed.  . docusate sodium (  COLACE) 100 MG capsule Take 200 mg by mouth daily as needed.    . loratadine (CLARITIN) 10 MG tablet Take 10 mg by mouth daily. As needed  . minoxidil (ROGAINE) 2 % external solution Apply topically 2 (two) times daily.    . montelukast (SINGULAIR) 10 MG tablet TAKE 1 TABLET EVERY EVENING TO PREVENT COUGH OR WHEEZE.  . Saline (SIMPLY SALINE) 0.9 % AERS 1 spray by Nasal route 2 (two) times daily.    . traMADol (ULTRAM) 50 MG tablet Take 1 tablet (50 mg total) by mouth 3 (three) times daily as needed.  . Zoster Vac Recomb Adjuvanted [facility]) injection Inject 0.5 mLs into the muscle once.  . [DISCONTINUED] Zoster Vac Recomb Adjuvanted (SHINGRIX) injection Inject 0.5 mLs into the muscle once.   No facility-administered encounter medications on file as of 09/19/2016.     Activities of Daily Living In your present state of health, do you have any difficulty performing the  following activities: 09/19/2016 09/29/2015  Hearing? Tempie Donning  Vision? Y Y  Difficulty concentrating or making decisions? Y N  Walking or climbing stairs? N N  Dressing or bathing? N N  Doing errands, shopping? Y N  Preparing Food and eating ? N N  Using the Toilet? N N  In the past six months, have you accidently leaked urine? Y Y  Do you have problems with loss of bowel control? N N  Managing your Medications? Y Y  Managing your Finances? N Y  Housekeeping or managing your Housekeeping? Y N  Some recent data might be hidden    Patient Care Team: Gayland Curry, DO as PCP - General (Geriatric Medicine) [facility] Clance, Armando Reichert, MD as Consulting Physician (Pulmonary Disease) Verl Blalock, Marijo Conception, MD (Inactive) as Consulting Physician (Cardiology) Latanya Maudlin, MD as Consulting Physician (Orthopedic Surgery) Kathrynn Ducking, MD as Consulting Physician (Neurology) [place], Jennefer Bravo, MD as Referring Physician (Dermatology) Gean Quint, MD as Consulting Physician (Allergy and Immunology) Shon Hough, MD as Consulting Physician (Ophthalmology) Paula Compton, MD as Consulting Physician (Obstetrics and Gynecology) Vicie Mutters, MD as Consulting Physician (Otolaryngology) Raeanne Gathers, AUD (Audiology) Carmel Sacramento, CCC-A (Audiology)    Assessment:     Exercise Activities and Dietary recommendations Current Exercise Habits: Home exercise routine, Type of exercise: walking;Other - see comments (water aerobics), Time (Minutes): 45, Frequency (Times/Week): 3, Weekly Exercise (Minutes/Week): 135, Intensity: Moderate  Goals    . Increase water intake          Increase water intake by 1-2 glasses per day      Fall Risk Fall Risk  09/19/2016 04/12/2016 09/29/2015 09/01/2014 01/12/2014  Falls in the past year? Yes No No No No  Number falls in past yr: 2 or more - - - -  Injury with Fall? Yes - - - -   Depression Screen PHQ 2/9 Scores 09/19/2016  04/12/2016 09/29/2015 01/12/2014  PHQ - 2 Score 0 0 0 0     Cognitive Function MMSE - Mini Mental State Exam 09/19/2016 09/29/2015  Orientation to time 4 5  Orientation to Place 5 5  Registration 3 3  Attention/ Calculation 5 5  Recall 0 3  Language- name 2 objects 2 2  Language- repeat 1 1  Language- follow 3 step command 3 3  Language- read & follow direction 1 1  Write a sentence 1 1  Copy design 1 1  Total score 26 30        Immunization  History  Administered Date(s) Administered  . Influenza Whole 12/12/2011, 12/31/2012  . Influenza-Unspecified 12/31/2014, 01/06/2016  . Pneumococcal Conjugate-13 07/14/2014  . Pneumococcal Polysaccharide-23 03/13/2005  . Zoster 03/13/2006   Screening Tests Health Maintenance  Topic Date Due  . URINE MICROALBUMIN  11/15/1935  . TETANUS/TDAP  11/14/1944  . INFLUENZA VACCINE  10/11/2016  . DEXA SCAN  Completed  . PNA vac Low Risk Adult  Completed      Plan:  I have personally reviewed and addressed the Medicare Annual Wellness questionnaire and have noted the following in the patient's chart:  A. Medical and social history B. Use of alcohol, tobacco or illicit drugs  C. Current medications and supplements D. Functional ability and status E.  Nutritional status F.  Physical activity G. Advance directives H. List of other physicians I.  Hospitalizations, surgeries, and ER visits in previous 12 months J.  Heil to include hearing, vision, cognitive, depression L. Referrals and appointments - none  In addition, I have reviewed and discussed with patient certain preventive protocols, quality metrics, and best practice recommendations. A written personalized care plan for preventive services as well as general preventive health recommendations were provided to patient.  See attached scanned questionnaire for additional information.   Signed,   Rich Reining, RN Nurse Health Advisor   Quick Notes   Health  Maintenance: Shingles vaccine sent to pharmacy. Pt will check on TDAP to see if gotten previously     Abnormal Screen: MMSE 26/30. Did not pass clock drawing      Patient Concerns: None     Nurse Concerns: None

## 2016-09-26 DIAGNOSIS — D649 Anemia, unspecified: Secondary | ICD-10-CM | POA: Diagnosis not present

## 2016-09-26 DIAGNOSIS — R739 Hyperglycemia, unspecified: Secondary | ICD-10-CM | POA: Diagnosis not present

## 2016-09-26 DIAGNOSIS — E890 Postprocedural hypothyroidism: Secondary | ICD-10-CM | POA: Diagnosis not present

## 2016-09-26 DIAGNOSIS — E119 Type 2 diabetes mellitus without complications: Secondary | ICD-10-CM | POA: Diagnosis not present

## 2016-09-26 DIAGNOSIS — I10 Essential (primary) hypertension: Secondary | ICD-10-CM | POA: Diagnosis not present

## 2016-09-26 DIAGNOSIS — E039 Hypothyroidism, unspecified: Secondary | ICD-10-CM | POA: Diagnosis not present

## 2016-09-26 DIAGNOSIS — E785 Hyperlipidemia, unspecified: Secondary | ICD-10-CM | POA: Diagnosis not present

## 2016-09-26 LAB — LIPID PANEL
Cholesterol: 254 — AB (ref 0–200)
HDL: 54 (ref 35–70)
LDL Cholesterol: 173
Triglycerides: 136 (ref 40–160)

## 2016-09-26 LAB — BASIC METABOLIC PANEL
BUN: 14 (ref 4–21)
Creatinine: 0.7 (ref 0.5–1.1)
Glucose: 102
Potassium: 4.9 (ref 3.4–5.3)
Sodium: 143 (ref 137–147)

## 2016-09-26 LAB — HEMOGLOBIN A1C: Hemoglobin A1C: 5.7

## 2016-09-26 LAB — CBC AND DIFFERENTIAL
HCT: 46 (ref 36–46)
Hemoglobin: 15.4 (ref 12.0–16.0)
Platelets: 234 (ref 150–399)
WBC: 4.5

## 2016-09-26 LAB — TSH: TSH: 4.21 (ref 0.41–5.90)

## 2016-09-27 ENCOUNTER — Encounter: Payer: Self-pay | Admitting: Internal Medicine

## 2016-10-04 ENCOUNTER — Non-Acute Institutional Stay: Payer: Medicare Other | Admitting: Internal Medicine

## 2016-10-04 ENCOUNTER — Encounter: Payer: Self-pay | Admitting: Internal Medicine

## 2016-10-04 VITALS — BP 120/80 | HR 76 | Temp 97.9°F | Ht 60.0 in | Wt 137.0 lb

## 2016-10-04 DIAGNOSIS — G250 Essential tremor: Secondary | ICD-10-CM | POA: Diagnosis not present

## 2016-10-04 DIAGNOSIS — J45909 Unspecified asthma, uncomplicated: Secondary | ICD-10-CM

## 2016-10-04 DIAGNOSIS — M130 Polyarthritis, unspecified: Secondary | ICD-10-CM

## 2016-10-04 DIAGNOSIS — E7800 Pure hypercholesterolemia, unspecified: Secondary | ICD-10-CM | POA: Diagnosis not present

## 2016-10-04 DIAGNOSIS — E039 Hypothyroidism, unspecified: Secondary | ICD-10-CM | POA: Diagnosis not present

## 2016-10-04 DIAGNOSIS — R739 Hyperglycemia, unspecified: Secondary | ICD-10-CM

## 2016-10-04 DIAGNOSIS — G20 Parkinson's disease: Secondary | ICD-10-CM | POA: Diagnosis not present

## 2016-10-04 DIAGNOSIS — I10 Essential (primary) hypertension: Secondary | ICD-10-CM | POA: Diagnosis not present

## 2016-10-04 DIAGNOSIS — G5603 Carpal tunnel syndrome, bilateral upper limbs: Secondary | ICD-10-CM

## 2016-10-04 DIAGNOSIS — G20C Parkinsonism, unspecified: Secondary | ICD-10-CM

## 2016-10-04 DIAGNOSIS — M25572 Pain in left ankle and joints of left foot: Secondary | ICD-10-CM

## 2016-10-04 MED ORDER — TRAMADOL HCL 50 MG PO TABS: 50.0000 mg | ORAL_TABLET | Freq: Three times a day (TID) | ORAL | 0 refills | Status: DC | PRN

## 2016-10-04 NOTE — Progress Notes (Signed)
Location:  Occupational psychologist of Service:  Clinic (12)  Provider: Drue Harr L. Mariea Clonts, D.O., C.M.D.  Code Status: DNR Goals of Care:  Advanced Directives 10/04/2016  Does Patient Have a Medical Advance Directive? Yes  Type of Paramedic of [place];Living will;Out of facility DNR (pink MOST or yellow form)  Does patient want to make changes to medical advance directive? -  Copy of [place] in Chart? Yes  Pre-existing out of facility DNR order (yellow form or pink MOST form) Yellow form placed in chart (order not valid for inpatient use)   Chief Complaint  Patient presents with  . Medical Management of Chronic Issues    follow-up    HPI: Patient is a 83 y.o. female seen today for medical management of chronic diseases.    Has been having pain in her elbow now.  It had been in her back, then in her leg.  Had the elbow pain last night.  She's used hot and cold compresses and taken her pain pill.  It comes and goes.  She's not sure what brings it on.  She does not recall hurting it in any way.    She had two falls--both tripped--once getting into a van both times.  She had been in [place] getting [place] from airport to hotel and other was her son in [place] vehicle.  Used left leg first and has better control with right. Notes left knee did have torn meniscus in the past and hasn't worked exactly right since.     I reviewed her EMG/NCS and note from Dr. Jannifer Franklin.  She wore her wrist braces for the 6 wks recommended and still wears them at night now.  She did not have pain, just had numbness of the hands.    She is wondering if she is progressing to Parkinson's with more difficulty moving her left than her right leg and increased tremor.  She is drooling some also.    Reviewed labs.  Tries to watch diet but it's hard here.  Discussed improving it with diet.  Does eat fewer eggs.  Does eat desserts, sauces are on foods.  Does not eat  cheese, drinks skim milk, eats red meat only once a week.  Eats fish most days.  Eats ice cream often in the summer.    Shingrix was on back order when it was sent.  She's going to check with the pharmacy about the last tdap.    [facility]  Past Medical History:  Diagnosis Date  . Allergic rhinitis    pollen and grass  . Asthma   . Blepharitis   . Breech delivery 1962  . Carpal tunnel syndrome, bilateral 07/03/2016  . Delivery normal 1960  . Hyperlipidemia   . Irregular heart rhythm   . Macular degeneration 2012  . Movement disorder    Tremor  . Osteoporosis   . Sensorineural hearing loss, bilateral 2007  . Shingles 2004  . Skin cancer 2005   nose  . Torn meniscus 2006   right knee  . Tremor 2006   familiar.  dx by Dr. Quillian Quince  . Unspecified essential hypertension   . Unspecified hypothyroidism   . Urinary frequency     Past Surgical History:  Procedure Laterality Date  . ABDOMINAL HYSTERECTOMY  1977  . CATARACT EXTRACTION  1974   Left, IOL implants  . CATARACT EXTRACTION Right 1979   IOL implants  .  CHOLECYSTECTOMY  1981  . COSMETIC SURGERY  1995   eye  . [place]  . DILATION AND CURETTAGE OF UTERUS  1977  . [place]  . HIATAL HERNIA REPAIR  1977  . SKIN CANCER EXCISION  2005   nose  . THYROIDECTOMY  1955  . TONSILLECTOMY  1931    Allergies  Allergen Reactions  . Clindamycin/Lincomycin     diarrhea  . Ibuprofen   . Penicillins     Allergies as of 10/04/2016      Reactions   Clindamycin/lincomycin    diarrhea   Ibuprofen    Penicillins       Medication List       Accurate as of 10/04/16 10:48 AM. Always use your most recent med list.          albuterol 108 (90 Base) MCG/ACT inhaler Commonly known as:  PROAIR HFA Inhale 2 puffs into the lungs every 4 (four) hours as needed for wheezing or shortness of breath.   ALPRAZolam 0.25 MG tablet Commonly known as:   XANAX Take 1 tablet (0.25 mg total) by mouth daily as needed.   Biotin 1000 MCG tablet Take 1,000 mcg by mouth daily. Reported on 04/15/2015   budesonide 180 MCG/ACT inhaler Commonly known as:  PULMICORT FLEXHALER Inhale 1 puff into the lungs daily.   calcium carbonate 500 MG chewable tablet Commonly known as:  TUMS - dosed in mg elemental calcium Chew 1 tablet by mouth daily.   Diclofenac Sodium 1 % Crea Place 1 application onto the skin as needed.   docusate sodium 100 MG capsule Commonly known as:  COLACE Take 200 mg by mouth daily as needed.   DYMISTA 137-50 MCG/ACT Susp Generic drug:  Azelastine-Fluticasone Place 1 spray into the nose 2 (two) times daily.   loratadine 10 MG tablet Commonly known as:  CLARITIN Take 10 mg by mouth daily. As needed   minoxidil 2 % external solution Commonly known as:  ROGAINE Apply topically 2 (two) times daily.   montelukast 10 MG tablet Commonly known as:  SINGULAIR TAKE 1 TABLET EVERY EVENING TO PREVENT COUGH OR WHEEZE.   SIMPLY SALINE 0.9 % Aers Generic drug:  Saline 1 spray by Nasal route 2 (two) times daily.   traMADol 50 MG tablet Commonly known as:  ULTRAM Take 1 tablet (50 mg total) by mouth 3 (three) times daily as needed.   Vitamin D 2000 units Caps Take 1 capsule (2,000 Units total) by mouth daily.       Review of Systems:  Review of Systems  Constitutional: Negative for chills, fever and malaise/fatigue.  HENT: Positive for hearing loss.        Hearing aids  Eyes:       Glasses  Respiratory: Negative for cough and shortness of breath.   Cardiovascular: Negative for chest pain, palpitations and leg swelling.  Gastrointestinal: Negative for abdominal pain, blood in stool, constipation and melena.  Genitourinary: Negative for dysuria.  Musculoskeletal: Positive for falls and joint pain.       Moves around joint to joint, one by one  Skin: Negative for itching and rash.  Neurological: Positive for tremors and  sensory change. Negative for dizziness, loss of consciousness and weakness.  Psychiatric/Behavioral: Negative for depression and memory loss. The patient does not have insomnia.     Health Maintenance  Topic Date Due  . URINE MICROALBUMIN  11/15/1935  . TETANUS/TDAP  11/14/1944  . INFLUENZA VACCINE  10/11/2016  . DEXA SCAN  Completed  . PNA vac Low Risk Adult  Completed    Physical Exam: Vitals:   10/04/16 0958  BP: 120/80  Pulse: 76  Temp: 97.9 F (36.6 C)  TempSrc: Oral  SpO2: 97%  Weight: 137 lb (62.1 kg)  Height: 5' (1.524 m)   Body mass index is 26.76 kg/m. Physical Exam  Constitutional: She is oriented to person, place, and time. She appears well-developed and well-nourished. No distress.  HENT:  Head: Normocephalic and atraumatic.  Right Ear: External ear normal.  Left Ear: External ear normal.  Nose: Nose normal.  Mouth/Throat: Oropharynx is clear and moist.  Eyes: Pupils are equal, round, and reactive to light. EOM are normal.  Neck: Neck supple. No JVD present.  Cardiovascular: Normal rate, regular rhythm, normal heart sounds and intact distal pulses.   No murmur heard. Pulmonary/Chest: Effort normal and breath sounds normal. No respiratory distress.  Abdominal: Bowel sounds are normal.  Musculoskeletal: Normal range of motion. She exhibits no tenderness.  Elbow w/o effusion, warmth, tenderness or decrease in ROM  Lymphadenopathy:    She has no cervical adenopathy.  Neurological: She is alert and oriented to person, place, and time. No cranial nerve deficit. She exhibits abnormal muscle tone.  Increased rigidity of left arm, some resting tremor of left arm, decreased coordination of left leg vs. Right with foot taps   Skin: Skin is warm and dry. Capillary refill takes less than 2 seconds. There is pallor.  Psychiatric: Her behavior is normal. Judgment and thought content normal.  Increased masked facies    Labs reviewed: Basic Metabolic Panel:  Recent  Labs  03/21/16 09/26/16 0800  NA  --  143  K  --  4.9  BUN  --  14  CREATININE  --  0.7  TSH 4.45 4.21   Liver Function Tests: No results for input(s): AST, ALT, ALKPHOS, BILITOT, PROT, ALBUMIN in the last 8760 hours. No results for input(s): LIPASE, AMYLASE in the last 8760 hours. No results for input(s): AMMONIA in the last 8760 hours. CBC:  Recent Labs  09/26/16 0800  WBC 4.5  HGB 15.4  HCT 46  PLT 234   Lipid Panel:  Recent Labs  09/26/16 0800  CHOL 254*  HDL 54  LDLCALC 173  TRIG 136   Lab Results  Component Value Date   HGBA1C 5.7 09/26/2016    Assessment/Plan 1. Pain in joint, ankle and foot, left - could not remove diagnosis from list b/c of med refill - traMADol (ULTRAM) 50 MG tablet; Take 1 tablet (50 mg total) by mouth 3 (three) times daily as needed.  Dispense: 90 tablet; Refill: 0  2. Tremor, essential -suspect she is progressing to Parkinson's disease -she does not want to live with Parkinson's if she does have it and is very clear she would not want her life prolonged in that situation  3. Parkinsonism, unspecified Parkinsonism type ([place]) -seems she is having more signs especially on her left side plus more masked facies, drooling, and two recent falls -has upcoming neurology appt  4. Essential hypertension -bp at goal with current therapy, cont same w/o change  5. Hypothyroidism, unspecified type -cont current levothyroxine therapy, monitor, TSH was wnl  6. Asthma, intrinsic -cont singulair, pulmicort and prn albuterol--has not needed to use it  7. Carpal tunnel syndrome, bilateral -cont use of braces, keep neurology f/u  8. Pure hypercholesterolemia -not on cholesterol medication, cont diet and exercise --says she'll cut down  on ice cream and other desserts b/c cholesterol has been trending up -had not been to water aerobics for a while but just got back to it--encouraged her to restart her water aerobics and walking routine  -she is  89 so I certainly wouldn't start her on medication for this now  9. Hyperglycemia -hba1c in upper limits of normal now -working on diet and exercise  10. Arthritis of multiple sites -migratory--seems to go from a single joint to another single joint, has not had any effusions to suggest more of a pseudogout and no erythema of joints, just aching that moves around -counseled on use of tramadol for that and continued exercise to keep joints mobile  Labs/tests ordered:  No orders of the defined types were placed in this encounter.  Next appt:  6 mos med mgt  Ojas Coone L. Tinsleigh Slovacek, D.O. [facility] [address], [place] [address] Cell Phone (Mon-Fri 8am-5pm):  [phone] On Call:  [phone] & follow prompts after 5pm & weekends Office Phone:  [phone] Office Fax:  [phone]

## 2016-10-18 ENCOUNTER — Encounter: Payer: Self-pay | Admitting: Allergy & Immunology

## 2016-10-18 ENCOUNTER — Ambulatory Visit (INDEPENDENT_AMBULATORY_CARE_PROVIDER_SITE_OTHER): Payer: Medicare Other | Admitting: Allergy & Immunology

## 2016-10-18 VITALS — BP 124/70 | HR 72 | Resp 16

## 2016-10-18 DIAGNOSIS — J302 Other seasonal allergic rhinitis: Secondary | ICD-10-CM | POA: Diagnosis not present

## 2016-10-18 DIAGNOSIS — J4530 Mild persistent asthma, uncomplicated: Secondary | ICD-10-CM

## 2016-10-18 NOTE — Progress Notes (Signed)
FOLLOW UP  Date of Service/Encounter:  10/18/16   Assessment:   Mild persistent asthma, uncomplicated  Seasonal allergic rhinitis   Asthma Reportables:  Severity: mild persistent  Risk: low Control: well controlled   Plan/Recommendations:   1. Mild persistent asthma, uncomplicated - Double the Pulmicort to two puffs once daily to see if this helps the shortness of breath.  - I did feel that Ms. Haddaway could be more improved with the use of a combined ICS/LABA, but she preferred to avoid new medications.  - Continue with ProAir 2-4 puffs every 4-6 hours as needed. - Breathing tests look great today.   2. Allergic rhinoconjunctivitis - Continue with Dymista one spray per nostril twice daily. - Try stopping the montelukast.  - You should know within 24-48 hours if you definitely need it.  - If your symptoms get worse, you can restart the montelukast.  - Use Claritin (lortadine), but change to as needed.   3. Return to clinic in six months.   Subjective:   Alonna Bartling is a 83 y.o. female presenting today for follow up of  Chief Complaint  Patient presents with  . Follow-up    Zemira Zehring has a history of the following: Patient Active Problem List   Diagnosis Date Noted  . Carpal tunnel syndrome, bilateral 07/03/2016  . Numbness and tingling in both hands 06/05/2016  . Drooling 04/12/2016  . Hyperglycemia 03/24/2015  . Senile osteopenia 03/24/2015  . 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 Murmur, cardiac 03/24/2015  . Conjunctivitis 01/12/2014  . Edema 08/20/2013  . Senile ecchymosis 06/23/2013  . Rash and nonspecific skin eruption 03/26/2013  . Pain in left knee 01/06/2013  . Osteopenia 12/16/2012  . Urinary frequency 09/23/2012  . Impacted cerumen 09/23/2012  . Hypothyroidism 09/23/2012  . Essential hypertension   . Hyperlipidemia   . Tremor, essential   . Cardiomegaly - hypertensive 08/10/2010  . Asthma, intrinsic 05/31/2010  .  Dyspnea 05/31/2010    History obtained from: chart review and patient.  Jeanett Schlein Pinder's Primary Care Provider is Mariea Clonts, Reuel Derby     Mesha is a 83 y.o. female presenting for a follow up visit. She was last seen in February 2018. At that time, we continued her on Pulmicort once daily as her controller medication. We also had plans to stop the Pulmicort during the summer months. Her allergic rhinitis was controlled with Dymista one spray per nostril twice daily. She also had an isolated elevated blood pressure at the last visit.   Since the last visit, she has mostly done well. She is having some problems with exercise tolerance. She has not required an ED visit and has not needed her rescue inhaler. She remains on her Pulmicort one puff once daily. She did stop the Pulmicort for a short period of time, but she cannot remember when she restarted it. This shortness of breath does not affect her on a daily basis, but maybe around once per week. She is not interested in trying other medications to help with these symptoms. Otherwise, Kinzleigh's asthma has been well controlled. She has not required rescue medication, experienced nocturnal awakenings due to lower respiratory symptoms, nor have activities of daily living been limited. She has required no Emergency Department or Urgent Care visits for her asthma. She has required zero courses of systemic steroids for asthma exacerbations since the last visit. ACT score today is 20, indicating excellent asthma symptom control.   She remains on the Simply saline and the  Dymista. She does have some right sided watery eyes. This is not itchy but is rather annoying. She does see on ophthalmologist and she remains on Sustain. She also reports that she is doing well aside from problems hearing, problems seeing, and problems with memory loss. She is going to be going to [place] for four days this weekend.   Otherwise, there have been no changes to  her past medical history, surgical history, family history, or social history.    Review of Systems: a 14-point review of systems is pertinent for what is mentioned in HPI.  Otherwise, all other systems were negative. Constitutional: negative other than that listed in the HPI Eyes: negative other than that listed in the HPI Ears, nose, mouth, throat, and face: negative other than that listed in the HPI Respiratory: negative other than that listed in the HPI Cardiovascular: negative other than that listed in the HPI Gastrointestinal: negative other than that listed in the HPI Genitourinary: negative other than that listed in the HPI Integument: negative other than that listed in the HPI Hematologic: negative other than that listed in the HPI Musculoskeletal: negative other than that listed in the HPI Neurological: negative other than that listed in the HPI Allergy/Immunologic: negative other than that listed in the HPI    Objective:   Blood pressure 124/70, pulse 72, resp. rate 16. There is no height or weight on file to calculate BMI.   Physical Exam:  General: Alert, interactive, in no acute distress. Very pleasant and normal female. Eyes: Some tearing around the right eye, No conjunctival injection present on the right, No conjunctival injection present on the left, PERRL bilaterally, No discharge on the right, No discharge on the left and No Horner-Trantas dots present Ears: Hearing aids in place bilaterally.  Nose/Throat: External nose within normal limits and septum midline, turbinates minimally edematous without discharge, post-pharynx mildly erythematous without cobblestoning in the posterior oropharynx. Tonsils 2+ without exudates Neck: Supple without thyromegaly. Lungs: Clear to auscultation without wheezing, rhonchi or rales. No increased work of breathing. CV: Normal S1/S2, no murmurs. Capillary refill <2 seconds.  Skin: Warm and dry, without lesions or rashes. Neuro:    Grossly intact. No focal deficits appreciated. Responsive to questions.   Diagnostic studies:   Spirometry: results normal (FEV1: 1.06/72%, FVC: 1.24/61%, FEV1/FVC: 85%).    Spirometry consistent with possible restrictive disease. Overall, her values are stable compared to previous visits.   Allergy Studies: none      Salvatore Marvel, MD [facility]

## 2016-10-18 NOTE — Patient Instructions (Signed)
1. Mild persistent asthma, uncomplicated - Double the Pulmicort to two puffs once daily to see if this helps the shortness of breath.  - Continue with ProAir 2-4 puffs every 4-6 hours as needed. - Breathing tests look great today.   2. Allergic rhinoconjunctivitis - Continue with Dymista one spray per nostril twice daily. - Try stopping the montelukast.  - You should know within 24-48 hours if you definitely need it.  - If your symptoms get worse, you can restart the montelukast.  - Use Claritin (lortadine), but change to as needed.   3. Return to clinic in six months.  Please inform us of any Emergency Department visits, hospitalizations, or changes in symptoms. Call us before going to the ED for breathing or allergy symptoms since we might be able to fit you in for a sick visit. Feel free to contact us anytime with any questions, problems, or concerns.  It was a pleasure to see you again today! Enjoy the beach trip!   Websites that have reliable patient information: 1. American Academy of Asthma, Allergy, and Immunology: www.aaaai.org 2. Food Allergy Research and Education (FARE): foodallergy.org 3. Mothers of Asthmatics: http://www.asthmacommunitynetwork.org 4. American College of Allergy, Asthma, and Immunology: www.acaai.org

## 2016-10-19 NOTE — Addendum Note (Signed)
Addended by: Gara Kroner L on: 10/19/2016 02:53 PM   Modules accepted: Orders

## 2016-10-30 ENCOUNTER — Other Ambulatory Visit: Payer: Self-pay | Admitting: Allergy & Immunology

## 2016-10-30 MED ORDER — MONTELUKAST SODIUM 10 MG PO TABS: ORAL_TABLET | ORAL | 1 refills | Status: DC

## 2016-10-30 NOTE — Telephone Encounter (Signed)
Patient would like a refill on Montelukast. She would like it sent to [facility]. She wanted it noted that she only uses [facility] for emergency refills.  She also wants it noted in her chart that she lives in [facility], not [facility].

## 2016-10-30 NOTE — Telephone Encounter (Signed)
Called patient. I informed patient that I sent refills [facility].

## 2016-12-26 DIAGNOSIS — L718 Other rosacea: Secondary | ICD-10-CM | POA: Diagnosis not present

## 2016-12-26 DIAGNOSIS — L218 Other seborrheic dermatitis: Secondary | ICD-10-CM | POA: Diagnosis not present

## 2016-12-26 DIAGNOSIS — L821 Other seborrheic keratosis: Secondary | ICD-10-CM | POA: Diagnosis not present

## 2017-01-02 NOTE — Progress Notes (Signed)
[facility]  PATIENT: Christina Lawson DOB: 1924/03/04   REASON FOR VISIT: follow up for essential tremor, CTS bil HISTORY FROM:patient    HISTORY OF PRESENT ILLNESS:Christina Lawson is an 83 year old right-handed white female with a history of an essential tremor affecting mainly the upper extremities. The patient has done very well  with alprazolam if needed for the tremor. She returns for follow-up today . When last seen she was having  numbness primarily in her left hand but also in her right hand. EMG nerve conduction confirmed bilateral carpal tunnel syndrome she is now wearing wrist splints at night . The patient indicates that the essential tremor does not impact her ability to perform activities of daily living. The patient has some sloppy handwriting, but her handwriting is legible. The patient is not having excessive problems with feeding herself, or using her hands otherwise. The patient has some mild gait instability, 2  falls since last seen both getting into a van. No apparent injury . The patient notes that the tremors worse if she is upset or excited about something. The patient currently lives at [place]. She no longer drives. She continues  water aerobics 3 times a week . She is also Geologist, engineering. The patient returns for an evaluation with no new neurologic complaints.   REVIEW OF SYSTEMS: Full 14 system review of systems performed and notable only for those listed, all others are neg:  Constitutional: neg  Cardiovascular: neg Ear/Nose/Throat: Hearing loss  Skin: neg Eyes: neg Respiratory: neg Gastroitestinal: Frequency of urination Hematology/Lymphatic: easy bruising  Endocrine: neg Musculoskeletal:neg Allergy/Immunology: neg Neurological: tremors, carpal tunnel syndrome bilateral Psychiatric: anxiety Sleep : neg   ALLERGIES: Allergies  Allergen Reactions  . Clindamycin/Lincomycin     diarrhea  . Ibuprofen   . Penicillins     HOME  MEDICATIONS: Outpatient Medications Prior to Visit  Medication Sig Dispense Refill  . albuterol (PROAIR HFA) 108 (90 Base) MCG/ACT inhaler Inhale 2 puffs into the lungs every 4 (four) hours as needed for wheezing or shortness of breath. 1 Inhaler 1  . ALPRAZolam (XANAX) 0.25 MG tablet Take 1 tablet (0.25 mg total) by mouth daily as needed. 90 tablet 1  . Azelastine-Fluticasone (DYMISTA) 137-50 MCG/ACT SUSP Place 1 spray into the nose 2 (two) times daily. She takes as needed    . Biotin 1000 MCG tablet Take 1,000 mcg by mouth daily. Reported on 04/15/2015    . budesonide (PULMICORT FLEXHALER) 180 MCG/ACT inhaler Inhale 1 puff into the lungs daily. 3 Inhaler 1  . calcium carbonate (TUMS - DOSED IN MG ELEMENTAL CALCIUM) 500 MG chewable tablet Chew 1 tablet by mouth daily.    . Cholecalciferol (VITAMIN D) 2000 units CAPS Take 1 capsule (2,000 Units total) by mouth daily. (Patient taking differently: Take 1 capsule by mouth daily. She takes as needed (depending on her sun light exposure that day).) 30 capsule 3  . Diclofenac Sodium 1 % CREA Place 1 application onto the skin as needed.    . docusate sodium (COLACE) 100 MG capsule Take 200 mg by mouth daily as needed.      . loratadine (CLARITIN) 10 MG tablet Take 10 mg by mouth daily. As needed    . minoxidil (ROGAINE) 2 % external solution Apply topically 2 (two) times daily.      . montelukast (SINGULAIR) 10 MG tablet TAKE 1 TABLET EVERY EVENING TO PREVENT COUGH OR WHEEZE. 90 tablet 1  . Saline (SIMPLY SALINE) 0.9 % AERS 1 spray  by Nasal route 2 (two) times daily.      . traMADol (ULTRAM) 50 MG tablet Take 1 tablet (50 mg total) by mouth 3 (three) times daily as needed. 90 tablet 0   No facility-administered medications prior to visit.     PAST MEDICAL HISTORY: Past Medical History:  Diagnosis Date  . Allergic rhinitis    pollen and grass  . Asthma   . Blepharitis   . Breech delivery 1962  . Carpal tunnel syndrome, bilateral 07/03/2016  .  Delivery normal 1960  . Hyperlipidemia   . Irregular heart rhythm   . Macular degeneration 2012  . Movement disorder    Tremor  . Osteoporosis   . Sensorineural hearing loss, bilateral 2007  . Shingles 2004  . Skin cancer 2005   nose  . Torn meniscus 2006   right knee  . Tremor 2006   familiar.  dx by Dr. Quillian Quince  . Unspecified essential hypertension   . Unspecified hypothyroidism   . Urinary frequency     PAST SURGICAL HISTORY: Past Surgical History:  Procedure Laterality Date  . ABDOMINAL HYSTERECTOMY  1977  . CATARACT EXTRACTION  1974   Left, IOL implants  . CATARACT EXTRACTION Right 1979   IOL implants  . CHOLECYSTECTOMY  1981  . COSMETIC SURGERY  1995   eye  . [place]  . DILATION AND CURETTAGE OF UTERUS  1977  . [place]  . HIATAL HERNIA REPAIR  1977  . SKIN CANCER EXCISION  2005   nose  . THYROIDECTOMY  1955  . TONSILLECTOMY  1931    FAMILY HISTORY: Family History  Problem Relation Age of Onset  . Emphysema Father   . Allergies Father   . Asthma Father   . Brain cancer Mother   . Cancer Mother        Brain  . Hypertension Mother   . Cancer Sister        Cervical  . Endometrial cancer Sister   . Cancer Sister        Breast, metastasized to bone  . Heart disease Maternal Grandfather   . Stroke Unknown     SOCIAL HISTORY: Social History   Social History  . Marital status: Single    Spouse name: N/A  . Number of children: 2  . Years of education: college   Occupational History  . retired Education officer, museum     retired   Social History Main Topics  . Smoking status: Never Smoker  . Smokeless tobacco: Never Used  . Alcohol use Yes     Comment: on occ.  . Drug use: No  . Sexual activity: No   Other Topics Concern  . Not on file   Social History Narrative   Lives at [place]. Married 1958.   Never smoked   Alcohol minimal   Exercise predominantly walking, water aerobic   Retired  Education officer, museum           PHYSICAL EXAM  Vitals:   01/03/17 1034  BP: (!) 141/82  Pulse: 79  Weight: 135 lb 9.6 oz (61.5 kg)  Height: 5' (1.524 m)   Body mass index is 26.48 kg/m. General: The patient is alert and cooperative at the time of the examination.  Skin: No significant peripheral edema is noted.  Neurologic Exam  Mental status: The patient is oriented x 3.  Cranial nerves: Facial symmetry is present. Speech is normal, no aphasia or  dysarthria is noted. Extraocular movements are full. Visual fields are full. No head and neck tremor, no vocal tremor noted.  Motor: The patient has good strength in all 4 extremities.  Sensory examination: Soft touch sensation on the face, arms, and legs is symmetric. Coordination: The patient has good finger-nose-finger and heel-to-shin bilaterally. A mild intention tremor seen with finger-nose-finger.  Gait and station: The patient has a normal gait. Tandem gait is minimally unsteady. Romberg is negative. No drift is seen. No assistive device Reflexes: Deep tendon reflexes are symmetric.    DIAGNOSTIC DATA (LABS, IMAGING, TESTING) - I reviewed patient records, labs, notes, testing and imaging myself where available.  Lab Results  Component Value Date   WBC 4.5 09/26/2016   HGB 15.4 09/26/2016   HCT 46 09/26/2016   PLT 234 09/26/2016      Component Value Date/Time   NA 143 09/26/2016 0800   K 4.9 09/26/2016 0800   BUN 14 09/26/2016 0800   CREATININE 0.7 09/26/2016 0800   AST 20 09/21/2015   ALT 17 09/21/2015   ALKPHOS 53 09/21/2015   Lab Results  Component Value Date   CHOL 254 (A) 09/26/2016   HDL 54 09/26/2016   LDLCALC 173 09/26/2016   TRIG 136 09/26/2016   Lab Results  Component Value Date   HGBA1C 5.7 09/26/2016    Lab Results  Component Value Date   TSH 4.21 09/26/2016      ASSESSMENT AND PLAN  83 y.o. year old female  here to follow-up for essential tremor And CTS using wrist splints at night .     Continue Xanax daily if needed for tremor does not need refills Continue water aerobics 3 times weekly Continue wrist splints for carpal tunnel syndrome at night  F/U in 1 year  Next with Dr. Jannifer Franklin I spent 42min in total face to face time with the patient more than 50% of which was spent counseling and coordination of care, reviewing test results reviewing medications and discussing and reviewing the diagnosis of tremor and CTS . Be  careful with ambulation. Ask for assistance when getting  Into van at [place].  Christina Lawson, [facility], [facility], APRN  [facility] [address] [place], [place] [address] [phone]

## 2017-01-03 ENCOUNTER — Ambulatory Visit (INDEPENDENT_AMBULATORY_CARE_PROVIDER_SITE_OTHER): Payer: Medicare Other | Admitting: Nurse Practitioner

## 2017-01-03 ENCOUNTER — Encounter: Payer: Self-pay | Admitting: Nurse Practitioner

## 2017-01-03 VITALS — BP 141/82 | HR 79 | Ht 60.0 in | Wt 135.6 lb

## 2017-01-03 DIAGNOSIS — G5603 Carpal tunnel syndrome, bilateral upper limbs: Secondary | ICD-10-CM | POA: Diagnosis not present

## 2017-01-03 DIAGNOSIS — G250 Essential tremor: Secondary | ICD-10-CM | POA: Diagnosis not present

## 2017-01-03 NOTE — Progress Notes (Signed)
I have read the note, and I agree with the clinical assessment and plan.  WILLIS,CHARLES KEITH   

## 2017-01-03 NOTE — Patient Instructions (Signed)
Continue Xanax daily if needed for tremor Continue water aerobics 3 times weekly Continue wrist splints for carpal tunnel syndrome  F/U in 1 year  Next with Dr. Jannifer Franklin

## 2017-01-04 DIAGNOSIS — Z23 Encounter for immunization: Secondary | ICD-10-CM | POA: Diagnosis not present

## 2017-01-08 ENCOUNTER — Other Ambulatory Visit: Payer: Self-pay | Admitting: Internal Medicine

## 2017-01-08 DIAGNOSIS — Z1231 Encounter for screening mammogram for malignant neoplasm of breast: Secondary | ICD-10-CM

## 2017-01-24 DIAGNOSIS — H04223 Epiphora due to insufficient drainage, bilateral lacrimal glands: Secondary | ICD-10-CM | POA: Diagnosis not present

## 2017-01-24 DIAGNOSIS — H02105 Unspecified ectropion of left lower eyelid: Secondary | ICD-10-CM | POA: Diagnosis not present

## 2017-01-24 DIAGNOSIS — H02102 Unspecified ectropion of right lower eyelid: Secondary | ICD-10-CM | POA: Diagnosis not present

## 2017-01-29 DIAGNOSIS — D485 Neoplasm of uncertain behavior of skin: Secondary | ICD-10-CM | POA: Diagnosis not present

## 2017-01-29 DIAGNOSIS — L821 Other seborrheic keratosis: Secondary | ICD-10-CM | POA: Diagnosis not present

## 2017-01-29 DIAGNOSIS — L814 Other melanin hyperpigmentation: Secondary | ICD-10-CM | POA: Diagnosis not present

## 2017-01-29 DIAGNOSIS — D0471 Carcinoma in situ of skin of right lower limb, including hip: Secondary | ICD-10-CM | POA: Diagnosis not present

## 2017-02-08 ENCOUNTER — Ambulatory Visit
Admission: RE | Admit: 2017-02-08 | Discharge: 2017-02-08 | Disposition: A | Discharge status: Home / Self Care | Payer: Medicare Other | Source: Ambulatory Visit | Attending: Internal Medicine | Admitting: Internal Medicine

## 2017-02-08 DIAGNOSIS — Z1231 Encounter for screening mammogram for malignant neoplasm of breast: Secondary | ICD-10-CM | POA: Diagnosis not present

## 2017-02-27 DIAGNOSIS — L814 Other melanin hyperpigmentation: Secondary | ICD-10-CM | POA: Diagnosis not present

## 2017-02-27 DIAGNOSIS — D0471 Carcinoma in situ of skin of right lower limb, including hip: Secondary | ICD-10-CM | POA: Diagnosis not present

## 2017-03-19 ENCOUNTER — Telehealth: Payer: Self-pay | Admitting: Allergy & Immunology

## 2017-03-19 MED ORDER — AZELASTINE-FLUTICASONE 137-50 MCG/ACT NA SUSP: 1.0000 | Freq: Two times a day (BID) | NASAL | 1 refills | Status: DC

## 2017-03-19 NOTE — Telephone Encounter (Signed)
I spoke with patient and she has scheduled a follow up for 04-05-17 with Dr. Ernst Bowler. I also sent a refill in for her Dymista.

## 2017-03-19 NOTE — Telephone Encounter (Signed)
Patient has called stating that dr gallagher wanted to make some changes to her medications ( wouldn't specify) and she states that its time to do that now Please call patient to answer any questions

## 2017-03-20 ENCOUNTER — Other Ambulatory Visit: Payer: Self-pay

## 2017-03-20 MED ORDER — FLUTICASONE PROPIONATE 50 MCG/ACT NA SUSP: 1.0000 | Freq: Two times a day (BID) | NASAL | 5 refills | Status: DC

## 2017-03-20 MED ORDER — AZELASTINE HCL 0.15 % NA SOLN: 1.0000 | Freq: Two times a day (BID) | NASAL | 5 refills | Status: DC

## 2017-03-22 ENCOUNTER — Telehealth: Payer: Self-pay | Admitting: Allergy & Immunology

## 2017-03-22 NOTE — Telephone Encounter (Signed)
Pt called and said that she is almost out of [place]  And needs a nurse to call [facility] at [phone] option 2 to get them to send it now . [phone].

## 2017-03-22 NOTE — Telephone Encounter (Signed)
Spoke to patient and informed her that the medication has been processed and that we will give her a sample until her mail order arrive.

## 2017-03-27 DIAGNOSIS — L814 Other melanin hyperpigmentation: Secondary | ICD-10-CM | POA: Diagnosis not present

## 2017-03-27 DIAGNOSIS — L821 Other seborrheic keratosis: Secondary | ICD-10-CM | POA: Diagnosis not present

## 2017-03-27 DIAGNOSIS — D0471 Carcinoma in situ of skin of right lower limb, including hip: Secondary | ICD-10-CM | POA: Diagnosis not present

## 2017-04-05 ENCOUNTER — Encounter: Payer: Self-pay | Admitting: Allergy & Immunology

## 2017-04-05 ENCOUNTER — Ambulatory Visit (INDEPENDENT_AMBULATORY_CARE_PROVIDER_SITE_OTHER): Payer: Medicare Other | Admitting: Allergy & Immunology

## 2017-04-05 VITALS — BP 146/88 | HR 78 | Ht 60.0 in | Wt 132.0 lb

## 2017-04-05 DIAGNOSIS — J302 Other seasonal allergic rhinitis: Secondary | ICD-10-CM | POA: Diagnosis not present

## 2017-04-05 DIAGNOSIS — J4530 Mild persistent asthma, uncomplicated: Secondary | ICD-10-CM | POA: Diagnosis not present

## 2017-04-05 HISTORY — DX: Mild persistent asthma, uncomplicated: J45.30

## 2017-04-05 NOTE — Patient Instructions (Addendum)
1. Mild persistent asthma, uncomplicated - Lung testing had lower values compared to the last visits, even after the nebulizer treatment. - However you are doing well from a symptom perspective. - We will try using Breo 100/25 one puff once daily to see if this can improve your lung function. - We will touch base in one month to see how you are doing.  - Continue with ProAir 2-4 puffs every 4-6 hours as needed.  2. Allergic rhinoconjunctivitis - It seems that your insurance changes have resulted in changing you from [place] to fluticasone and azelastine (essentially the drugs in Dymista separated out): do one spray per nostril twice daily.  - Stop the montelukast and see if your symptoms get worse.  - Use Claritin (lortadine), but change to as needed.   3. Return in about 4 weeks (around 05/03/2017).  Please inform us of any Emergency Department visits, hospitalizations, or changes in symptoms. Call us before going to the ED for breathing or allergy symptoms since we might be able to fit you in for a sick visit. Feel free to contact us anytime with any questions, problems, or concerns.  It was a pleasure to see you again today! Happy New Year!   Websites that have reliable patient information: 1. American Academy of Asthma, Allergy, and Immunology: www.aaaai.org 2. Food Allergy Research and Education (FARE): foodallergy.org 3. Mothers of Asthmatics: http://www.asthmacommunitynetwork.org 4. American College of Allergy, Asthma, and Immunology: www.acaai.org

## 2017-04-05 NOTE — Progress Notes (Signed)
FOLLOW UP  Date of Service/Encounter:  04/05/17   Assessment:   Mild persistent asthma without complication  Seasonal allergic rhinitis   Asthma Reportables:  Severity: mild persistent  Risk: high due to worsening spirometry today Control: well controlled  Plan/Recommendations:   1. Mild persistent asthma - with worsening spirometric findings today - Lung testing had lower values compared to the last visits, even after the nebulizer treatment. - However you are doing well from a symptom perspective.  - We will try using Breo 100/25 one puff once daily to see if this can improve your lung function. - We will touch base in one month to see how you are doing.  - Continue with ProAir 2-4 puffs every 4-6 hours as needed.  2. Allergic rhinoconjunctivitis - It seems that your insurance changes have resulted in changing you from [place] to fluticasone and azelastine (essentially the drugs in Dymista separated out): do one spray per nostril twice daily.  - Stop the montelukast and see if your symptoms get worse.  - Use Claritin (lortadine), but change to as needed.   3. Return in about 4 weeks (around 05/03/2017).  Subjective:   Christina Lawson is a 84 y.o. female presenting today for follow up of  Chief Complaint  Patient presents with  . Follow-up    Pt presents for follow up of asthma. Pt states she is doing well. Pt has been using Astelin and Fluticasone.    Christina Lawson has a history of the following: Patient Active Problem List   Diagnosis Date Noted  . Mild persistent asthma without complication 19/14/7829  . Seasonal allergic rhinitis 04/05/2017  . Carpal tunnel syndrome, bilateral 07/03/2016  . Numbness and tingling in both hands 06/05/2016  . Drooling 04/12/2016  . Hyperglycemia 03/24/2015  . Senile osteopenia 03/24/2015  . 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 Murmur, cardiac 03/24/2015  . Conjunctivitis 01/12/2014  . Edema 08/20/2013  . Senile  ecchymosis 06/23/2013  . Rash and nonspecific skin eruption 03/26/2013  . Pain in left knee 01/06/2013  . Osteopenia 12/16/2012  . Urinary frequency 09/23/2012  . Impacted cerumen 09/23/2012  . Hypothyroidism 09/23/2012  . Essential hypertension   . Hyperlipidemia   . Tremor, essential   . Cardiomegaly - hypertensive 08/10/2010  . Asthma, intrinsic 05/31/2010  . Dyspnea 05/31/2010    History obtained from: chart review and patient.  Christina Lawson's Primary Care Provider is Alferd Apa     Christina Lawson is a 84 y.o. female presenting for a follow up visit. She is was last seen in August 2018. At that time, she had tried to decrease her Pulmicort to one puff daily, but was reporting increased shortness of breath. Therefore I recommended that she increase back to two puffs once daily. We discussed trying a combined ICS/LABA but she was hesitant to start new medications. Her ARC was controlled with Dymista one spray per nostril BID. We did discuss starting montelukast to see if she even required this.   Since the last visit, she reports that she has actually done fairly well. She is on the Pulmicort and is on two puffs once daily. This has helped her SOB which she was reporting at the last visit. She does have her rescue inhaler but only uses it 1-2 times per month. She has not required any prednisone. ACT today is 23, indicating excellent asthma control. She endorses excellent asthma control.   Rhinitis is well controlled. She was on Dymista previously, but her insurance changed  to covering only the separate Flonase and Astelin. She has not required any antibiotics since the last visit. Flu shot and shingles vaccinations are up to date. She does think that her Pneumovax is up to date as well.   Otherwise, there have been no changes to her past medical history, surgical history, family history, or social history. She lives at [place] in the [place] section. Two of her  granddaughters recently visited from [place].     Review of Systems: a 14-point review of systems is pertinent for what is mentioned in HPI.  Otherwise, all other systems were negative. Constitutional: negative other than that listed in the HPI Eyes: negative other than that listed in the HPI Ears, nose, mouth, throat, and face: negative other than that listed in the HPI Respiratory: negative other than that listed in the HPI Cardiovascular: negative other than that listed in the HPI Gastrointestinal: negative other than that listed in the HPI Genitourinary: negative other than that listed in the HPI Integument: negative other than that listed in the HPI Hematologic: negative other than that listed in the HPI Musculoskeletal: negative other than that listed in the HPI Neurological: negative other than that listed in the HPI Allergy/Immunologic: negative other than that listed in the HPI    Objective:   Blood pressure (!) 146/88, pulse 78, height 5' (1.524 m), weight 132 lb (59.9 kg), SpO2 96 %. Body mass index is 25.78 kg/m.   Physical Exam:  General: Alert, interactive, in no acute distress. Very adorable female.  Eyes: No conjunctival injection bilaterally, no discharge on the right, no discharge on the left and no Horner-Trantas dots present. PERRL bilaterally. EOMI without pain. No photophobia.  Ears: Hearing aids in place, Right TM pearly gray with normal light reflex, Left TM pearly gray with normal light reflex, Right TM intact without perforation and Left TM intact without perforation.  Nose/Throat: External nose within normal limits and septum midline. Turbinates edematous and pale with clear discharge. Posterior oropharynx mildly erythematous without cobblestoning in the posterior oropharynx. Tonsils 2+ without exudates.  Tongue without thrush. Adenopathy: no enlarged lymph nodes appreciated in the anterior cervical, occipital, axillary, epitrochlear, inguinal, or  popliteal regions. Lungs: Clear to auscultation without wheezing, rhonchi or rales. No increased work of breathing. CV: Normal S1/S2. No murmurs. Capillary refill <2 seconds.  Skin: Warm and dry, without lesions or rashes. Neuro:   Grossly intact. No focal deficits appreciated. Responsive to questions.  Diagnostic studies:   Spirometry: results abnormal (FEV1: 0.53/35%, FVC: 0.57/28%, FEV1/FVC: 93%).    Spirometry consistent with possible restrictive disease. Albuterol nebulizer treatment given in clinic with improvement in FEV1 and FVC, but not significant per ATS criteria. Compared to the values obtained at the last visit, there values have decreased (FEV1 decreased from 1.06 to 0.53 and her FVC decreased from 1.24 to 0.57).   Allergy Studies: none    Salvatore Marvel, MD [facility]

## 2017-04-11 ENCOUNTER — Non-Acute Institutional Stay: Payer: Medicare Other | Admitting: Internal Medicine

## 2017-04-11 ENCOUNTER — Encounter: Payer: Self-pay | Admitting: Internal Medicine

## 2017-04-11 VITALS — BP 140/80 | HR 75 | Temp 97.7°F | Wt 135.0 lb

## 2017-04-11 DIAGNOSIS — H01006 Unspecified blepharitis left eye, unspecified eyelid: Secondary | ICD-10-CM | POA: Diagnosis not present

## 2017-04-11 DIAGNOSIS — E7800 Pure hypercholesterolemia, unspecified: Secondary | ICD-10-CM

## 2017-04-11 DIAGNOSIS — G20C Parkinsonism, unspecified: Secondary | ICD-10-CM | POA: Insufficient documentation

## 2017-04-11 DIAGNOSIS — H01003 Unspecified blepharitis right eye, unspecified eyelid: Secondary | ICD-10-CM | POA: Diagnosis not present

## 2017-04-11 DIAGNOSIS — I10 Essential (primary) hypertension: Secondary | ICD-10-CM

## 2017-04-11 DIAGNOSIS — J45909 Unspecified asthma, uncomplicated: Secondary | ICD-10-CM

## 2017-04-11 DIAGNOSIS — G20 Parkinson's disease: Secondary | ICD-10-CM

## 2017-04-11 DIAGNOSIS — G5603 Carpal tunnel syndrome, bilateral upper limbs: Secondary | ICD-10-CM

## 2017-04-11 DIAGNOSIS — M130 Polyarthritis, unspecified: Secondary | ICD-10-CM | POA: Diagnosis not present

## 2017-04-11 DIAGNOSIS — G250 Essential tremor: Secondary | ICD-10-CM

## 2017-04-11 HISTORY — DX: Parkinson's disease: G20

## 2017-04-11 HISTORY — DX: Parkinsonism, unspecified: G20.C

## 2017-04-11 NOTE — Patient Instructions (Addendum)
Please continue to use the incentive spirometer to work on getting better expirations (breaths out).  We will check your labs before your next appointment in 6 months.  Cbc, cmp, flp, hba1c before  Follow-up with your ophthalmologist sooner due to your watery eyes being more bothersome and blurrier vision with pressure sensation.  Continue to use your brace for your carpal tunnel.  The middle finger of your left hand shows progression of arthritis.

## 2017-04-11 NOTE — Progress Notes (Signed)
Location:  [facility] of Service:  Clinic (12)  Provider: Keiaira Donlan L. Mariea Clonts, D.O., C.M.D.  Code Status: DNR Goals of Care:  Advanced Directives 04/11/2017  Does Patient Have a Medical Advance Directive? Yes  Type of Paramedic of [place];Living will;Out of facility DNR (pink MOST or yellow form)  Does patient want to make changes to medical advance directive? No - Patient declined  Copy of [place] in Chart? Yes  Pre-existing out of facility DNR order (yellow form or pink MOST form) Yellow form placed in chart (order not valid for inpatient use)   Chief Complaint  Patient presents with  . Medical Management of Chronic Issues    51mth follow-up    HPI: Patient is a 84 y.o. female seen today for medical management of chronic diseases.    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  She used the efudex cream.  She f/u in 2 mos with [facility].    Middle finger of left hand now bends and hurts, too.  Wearing brace on left wrist.  Discussed the middle finger being osteoarthritis.    Eyes tearing and feel watery.  Uses ocusoft on her lower lid.    Not hearing well.    With her allergies, she got new medications.  Words don't come as quickly as they did.    Still going to the pool and ambulates without assistive device.  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    Saw neurology and they did not feel her symptoms were suggestive of PD.  Takes her tremor medicine for her left hand if she is doing anything stressful or busy.  She is not so worried now.  Has appt with Dr. Jannifer Franklin this next time.    She is anxious about getting to see her daughter at noon.  Worries when she has a lot going on.  Says she could take the tremor medicine and it might calm her.         Past Medical History:  Diagnosis Date  . Allergic rhinitis    pollen and grass  . Asthma   . Blepharitis   . Breech delivery  1962  . Carpal tunnel syndrome, bilateral 07/03/2016  . Delivery normal 1960  . Hyperlipidemia   . Irregular heart rhythm   . Macular degeneration 2012  . Movement disorder    Tremor  . Osteoporosis   . Sensorineural hearing loss, bilateral 2007  . Shingles 2004  . Skin cancer 2005   nose  . Torn meniscus 2006   right knee  . Tremor 2006   familiar.  dx by Dr. Quillian Quince  . Unspecified essential hypertension   . Unspecified hypothyroidism   . Urinary frequency     Past Surgical History:  Procedure Laterality Date  . ABDOMINAL HYSTERECTOMY  1977  . CATARACT EXTRACTION  1974   Left, IOL implants  . CATARACT EXTRACTION Right 1979   IOL implants  . CHOLECYSTECTOMY  1981  . COSMETIC SURGERY  1995   eye  . [place]  . DILATION AND CURETTAGE OF UTERUS  1977  . [place]  . HIATAL HERNIA REPAIR  1977  . SKIN CANCER EXCISION  2005   nose  . THYROIDECTOMY  1955  . TONSILLECTOMY  1931    Allergies  Allergen Reactions  . Clindamycin/Lincomycin     diarrhea  . Ibuprofen   .  Penicillins     Outpatient Encounter Medications as of 04/11/2017  Medication Sig  . albuterol (PROAIR HFA) 108 (90 Base) MCG/ACT inhaler Inhale 2 puffs into the lungs every 4 (four) hours as needed for wheezing or shortness of breath.  . ALPRAZolam (XANAX) 0.25 MG tablet Take 1 tablet (0.25 mg total) by mouth daily as needed.  . Azelastine HCl 0.15 % SOLN Place 1 spray into the nose 2 (two) times daily.  . Azelastine-Fluticasone (DYMISTA) 137-50 MCG/ACT SUSP Place 1 spray into the nose 2 (two) times daily. She takes as needed  . Biotin 1000 MCG tablet Take 1,000 mcg by mouth daily. Reported on 04/15/2015  . budesonide (PULMICORT FLEXHALER) 180 MCG/ACT inhaler Inhale 1 puff into the lungs daily.  . calcium carbonate (TUMS - DOSED IN MG ELEMENTAL CALCIUM) 500 MG chewable tablet Chew 1 tablet by mouth daily.  . Cholecalciferol (VITAMIN D) 2000 units CAPS Take 1 capsule (2,000  Units total) by mouth daily.  . Diclofenac Sodium 1 % CREA Place 1 application onto the skin as needed.  . docusate sodium (COLACE) 100 MG capsule Take 200 mg by mouth daily as needed.    . fluticasone (FLONASE) 50 MCG/ACT nasal spray Place 1 spray into both nostrils 2 (two) times daily.  Marland Kitchen loratadine (CLARITIN) 10 MG tablet Take 10 mg by mouth daily. As needed  . minoxidil (ROGAINE) 2 % external solution Apply topically 2 (two) times daily.    . Saline (SIMPLY SALINE) 0.9 % AERS 1 spray by Nasal route 2 (two) times daily.    . traMADol (ULTRAM) 50 MG tablet Take 1 tablet (50 mg total) by mouth 3 (three) times daily as needed.  . [DISCONTINUED] montelukast (SINGULAIR) 10 MG tablet TAKE 1 TABLET EVERY EVENING TO PREVENT COUGH OR WHEEZE.   No facility-administered encounter medications on file as of 04/11/2017.     Review of Systems:  Review of Systems  Constitutional: Negative for chills, fever and malaise/fatigue.  HENT: Positive for hearing loss. Negative for congestion.   Eyes: Negative for blurred vision.       Watery eyes  Respiratory: Negative for cough and shortness of breath.   Cardiovascular: Negative for chest pain, palpitations and leg swelling.  Gastrointestinal: Negative for abdominal pain, blood in stool, constipation and melena.  Genitourinary: Negative for dysuria, frequency and urgency.  Musculoskeletal: Positive for joint pain. Negative for falls.  Skin: Negative for itching and rash.  Neurological: Positive for tremors. Negative for dizziness, loss of consciousness and weakness.       Resting of left hand  Endo/Heme/Allergies: Bruises/bleeds easily.  Psychiatric/Behavioral: Negative for memory loss. The patient is nervous/anxious.     Health Maintenance  Topic Date Due  . TETANUS/TDAP  11/14/1942  . INFLUENZA VACCINE  Completed  . DEXA SCAN  Completed  . PNA vac Low Risk Adult  Completed    Physical Exam: Vitals:   04/11/17 1100  BP: 140/80  Pulse: 75    Temp: 97.7 F (36.5 C)  TempSrc: Oral  SpO2: 98%  Weight: 135 lb (61.2 kg)   Body mass index is 26.37 kg/m. Physical Exam  Constitutional: She is oriented to person, place, and time. She appears well-developed and well-nourished. No distress.  HENT:  Head: Normocephalic and atraumatic.  Eyes:  glasses  Cardiovascular: Normal rate, regular rhythm, normal heart sounds and intact distal pulses.  Pulmonary/Chest: Effort normal and breath sounds normal. No respiratory distress.  Abdominal: Bowel sounds are normal.  Musculoskeletal: Normal range of motion.  Neurological: She is alert and oriented to person, place, and time.  Skin: Skin is warm and dry. Capillary refill takes less than 2 seconds.  Psychiatric: She has a normal mood and affect.    Labs reviewed: Basic Metabolic Panel: Recent Labs    09/26/16 0800  NA 143  K 4.9  BUN 14  CREATININE 0.7  TSH 4.21   Liver Function Tests: No results for input(s): AST, ALT, ALKPHOS, BILITOT, PROT, ALBUMIN in the last 8760 hours. No results for input(s): LIPASE, AMYLASE in the last 8760 hours. No results for input(s): AMMONIA in the last 8760 hours. CBC: Recent Labs    09/26/16 0800  WBC 4.5  HGB 15.4  HCT 46  PLT 234   Lipid Panel: Recent Labs    09/26/16 0800  CHOL 254*  HDL 54  LDLCALC 173  TRIG 136   Lab Results  Component Value Date   HGBA1C 5.7 09/26/2016    Procedures since last visit: No results found.  Assessment/Plan 1. Parkinsonism, unspecified Parkinsonism type ([place]) -pt is concerned that she has more than just essential tremor and does have some masked facies and bradykinesia, but primarily her essential tremor of left hand  2. Tremor, essential -cont prn xanax when this gets worse (she was anxious today and more bothered by it)  3. Essential hypertension -bp well controlled, no changes needed  4. Asthma, intrinsic -cont pulmicort, dymista, proair, use incentive spirometer per  allergy/pulmonary  5.Carpal tunnel syndrome, bilateral -cont wrist support  7. Pure hypercholesterolemia -not on meds and refused meds for years, cont to stay active and eat healthily  8. Arthritis of multiple sites -worsening, encouraged exercise and tylenol when bothersome, diclofenac gel  9. Blepharitis of both eyes, unspecified eyelid, unspecified type -f/u with ophthalmology as ocusoft, compresses and something else she's doing at night not offering adequate relief  Labs/tests ordered:  Cbc, cmp, flp, tsh, hba1c before Next appt:  6 mos, labs before  Bernis Schreur L. Amanat Hackel, D.O. [facility] [address], [place] [address] Cell Phone (Mon-Fri 8am-5pm):  [phone] On Call:  [phone] & follow prompts after 5pm & weekends Office Phone:  [phone] Office Fax:  [phone]

## 2017-04-12 ENCOUNTER — Telehealth: Payer: Self-pay | Admitting: Nurse Practitioner

## 2017-04-12 MED ORDER — ALPRAZOLAM 0.25 MG PO TABS: 0.2500 mg | ORAL_TABLET | Freq: Every day | ORAL | 1 refills | Status: DC | PRN

## 2017-04-12 NOTE — Telephone Encounter (Addendum)
Printed, signed.  Fax confirmation received [facility] [phone]. sy

## 2017-04-12 NOTE — Telephone Encounter (Signed)
Pt request refill for ALPRAZolam (XANAX) 0.25 MG tablet. Please send to [facility]

## 2017-04-18 ENCOUNTER — Encounter: Payer: Self-pay | Admitting: Internal Medicine

## 2017-04-18 ENCOUNTER — Non-Acute Institutional Stay: Payer: Medicare Other | Admitting: Internal Medicine

## 2017-04-18 VITALS — BP 118/78 | HR 81 | Temp 98.3°F | Wt 135.0 lb

## 2017-04-18 DIAGNOSIS — B9789 Other viral agents as the cause of diseases classified elsewhere: Secondary | ICD-10-CM | POA: Diagnosis not present

## 2017-04-18 DIAGNOSIS — J069 Acute upper respiratory infection, unspecified: Secondary | ICD-10-CM | POA: Diagnosis not present

## 2017-04-18 MED ORDER — PSEUDOEPHEDRINE HCL ER 120 MG PO TB12: 120.0000 mg | ORAL_TABLET | Freq: Two times a day (BID) | ORAL | 0 refills | Status: DC

## 2017-04-18 NOTE — Patient Instructions (Signed)
Warm humidity Nasal sprays per ENT/allergist Change to zyrtec from claritin if not working after this week Take generic sudafed for 1-2 wks max for nasal congestion May continue cough drops and cold-eeze Drink plenty of water and rest Ok also to increase vitamin C with OJ or lozenges Notify me if fever develops, cough getting deeper in chest and bring up discolored sputum, generally not improving after one week.

## 2017-04-18 NOTE — Progress Notes (Signed)
Location:  [place] of Service:  Clinic (12)  Provider: Lesly Pontarelli L. Mariea Clonts, D.O., C.M.D.  Code Status: DNR Goals of Care:  Advanced Directives 04/18/2017  Does Patient Have a Medical Advance Directive? Yes  Type of Paramedic of [place];Living will;Out of facility DNR (pink MOST or yellow form)  Does patient want to make changes to medical advance directive? No - Patient declined  Copy of [place] in Chart? Yes  Pre-existing out of facility DNR order (yellow form or pink MOST form) Yellow form placed in chart (order not valid for inpatient use)   Chief Complaint  Patient presents with  . Acute Visit    cold, runny nose  . ACP    HCPOA, DNR, LIVING WILL    HPI: Patient is a 84 y.o. female seen today for an acute visit for cold symptoms and runny nose.  Has not had fever, chills.  No sore throat.  Has increased nasal congestion and cough since Friday (4 days ago).   It's now starting to settle in her chest and she's produced some yellow sputum one time.  She feels a bit rundown.  Past Medical History:  Diagnosis Date  . Allergic rhinitis    pollen and grass  . Asthma   . Blepharitis   . Breech delivery 1962  . Carpal tunnel syndrome, bilateral 07/03/2016  . Delivery normal 1960  . Hyperlipidemia   . Irregular heart rhythm   . Macular degeneration 2012  . Movement disorder    Tremor  . Osteoporosis   . Sensorineural hearing loss, bilateral 2007  . Shingles 2004  . Skin cancer 2005   nose  . Torn meniscus 2006   right knee  . Tremor 2006   familiar.  dx by Dr. Quillian Quince  . Unspecified essential hypertension   . Unspecified hypothyroidism   . Urinary frequency     Past Surgical History:  Procedure Laterality Date  . ABDOMINAL HYSTERECTOMY  1977  . CATARACT EXTRACTION  1974   Left, IOL implants  . CATARACT EXTRACTION Right 1979   IOL implants  . CHOLECYSTECTOMY  1981  . COSMETIC SURGERY  1995   eye  .  [place]  . DILATION AND CURETTAGE OF UTERUS  1977  . [place]  . HIATAL HERNIA REPAIR  1977  . SKIN CANCER EXCISION  2005   nose  . THYROIDECTOMY  1955  . TONSILLECTOMY  1931    Allergies  Allergen Reactions  . Clindamycin/Lincomycin     diarrhea  . Ibuprofen   . Penicillins     Outpatient Encounter Medications as of 04/18/2017  Medication Sig  . albuterol (PROAIR HFA) 108 (90 Base) MCG/ACT inhaler Inhale 2 puffs into the lungs every 4 (four) hours as needed for wheezing or shortness of breath.  . ALPRAZolam (XANAX) 0.25 MG tablet Take 1 tablet (0.25 mg total) by mouth daily as needed.  . Azelastine HCl 0.15 % SOLN Place 1 spray into the nose 2 (two) times daily.  . Azelastine-Fluticasone (DYMISTA) 137-50 MCG/ACT SUSP Place 1 spray into the nose 2 (two) times daily. She takes as needed  . Biotin 1000 MCG tablet Take 1,000 mcg by mouth daily. Reported on 04/15/2015  . budesonide (PULMICORT FLEXHALER) 180 MCG/ACT inhaler Inhale 1 puff into the lungs daily.  . calcium carbonate (TUMS - DOSED IN MG ELEMENTAL CALCIUM) 500 MG chewable tablet Chew 1 tablet by mouth daily.  . Cholecalciferol (  VITAMIN D) 2000 units CAPS Take 1 capsule (2,000 Units total) by mouth daily.  . Diclofenac Sodium 1 % CREA Place 1 application onto the skin as needed.  . docusate sodium (COLACE) 100 MG capsule Take 200 mg by mouth daily as needed.    . fluticasone (FLONASE) 50 MCG/ACT nasal spray Place 1 spray into both nostrils 2 (two) times daily.  Marland Kitchen loratadine (CLARITIN) 10 MG tablet Take 10 mg by mouth daily. As needed  . minoxidil (ROGAINE) 2 % external solution Apply topically 2 (two) times daily.    . Saline (SIMPLY SALINE) 0.9 % AERS 1 spray by Nasal route 2 (two) times daily.    . traMADol (ULTRAM) 50 MG tablet Take 1 tablet (50 mg total) by mouth 3 (three) times daily as needed.  . pseudoephedrine (SUDAFED) 120 MG 12 hr tablet Take 1 tablet (120 mg total) by mouth 2 (two) times  daily.   No facility-administered encounter medications on file as of 04/18/2017.     Review of Systems:  Review of Systems  Constitutional: Positive for malaise/fatigue. Negative for chills and fever.  HENT: Positive for congestion and hearing loss. Negative for sore throat.   Eyes: Positive for redness. Negative for blurred vision.       Glasses  Respiratory: Positive for cough and sputum production. Negative for shortness of breath and wheezing.   Cardiovascular: Negative for chest pain and palpitations.  Gastrointestinal: Negative for abdominal pain.  Genitourinary: Negative for dysuria.  Musculoskeletal: Negative for falls.  Skin: Negative for itching and rash.  Neurological: Negative for dizziness and weakness.  Endo/Heme/Allergies: Bruises/bleeds easily.  Psychiatric/Behavioral: Negative for memory loss. The patient is nervous/anxious.     Health Maintenance  Topic Date Due  . TETANUS/TDAP  11/14/1944  . INFLUENZA VACCINE  Completed  . DEXA SCAN  Completed  . PNA vac Low Risk Adult  Completed    Physical Exam: Vitals:   04/18/17 1435  BP: 118/78  Pulse: 81  Temp: 98.3 F (36.8 C)  TempSrc: Oral  SpO2: 97%  Weight: 135 lb (61.2 kg)   Body mass index is 26.37 kg/m. Physical Exam  Constitutional: She is oriented to person, place, and time. She appears well-developed and well-nourished. No distress.  HENT:  Head: Normocephalic and atraumatic.  Right Ear: External ear normal.  Left Ear: External ear normal.  Clear rhinitis seen  Eyes: Conjunctivae and EOM are normal. Pupils are equal, round, and reactive to light.  Ptosis of lower lids  Neck: Normal range of motion. Neck supple.  Cardiovascular: Normal rate, regular rhythm, normal heart sounds and intact distal pulses.  Pulmonary/Chest: Effort normal and breath sounds normal. No respiratory distress.  No rhonchi or wheezes  Abdominal: Bowel sounds are normal.  Musculoskeletal: Normal range of motion.    Lymphadenopathy:    She has no cervical adenopathy.  Neurological: She is alert and oriented to person, place, and time.  tremor  Skin: Skin is warm and dry.  Psychiatric: She has a normal mood and affect.  A bit anxious and jittery but not as bad as last week    Labs reviewed: Basic Metabolic Panel: Recent Labs    09/26/16 0800  NA 143  K 4.9  BUN 14  CREATININE 0.7  TSH 4.21   Liver Function Tests: No results for input(s): AST, ALT, ALKPHOS, BILITOT, PROT, ALBUMIN in the last 8760 hours. No results for input(s): LIPASE, AMYLASE in the last 8760 hours. No results for input(s): AMMONIA in the last  8760 hours. CBC: Recent Labs    09/26/16 0800  WBC 4.5  HGB 15.4  HCT 46  PLT 234   Lipid Panel: Recent Labs    09/26/16 0800  CHOL 254*  HDL 54  LDLCALC 173  TRIG 136   Lab Results  Component Value Date   HGBA1C 5.7 09/26/2016    Procedures since last visit: No results found.  Assessment/Plan 1. Viral URI with cough Warm humidity Nasal sprays per ENT/allergist Change to zyrtec from claritin if not working after this week Take generic sudafed for 1-2 wks max for nasal congestion May continue cough drops and cold-eeze Drink plenty of water and rest Ok also to increase vitamin C with OJ or lozenges Notify me if fever develops, cough getting deeper in chest and bring up discolored sputum, generally not improving after one week. - pseudoephedrine (SUDAFED) 120 MG 12 hr tablet; Take 1 tablet (120 mg total) by mouth 2 (two) times daily.  Dispense: 28 tablet; Refill: 0  Labs/tests ordered:  No new Next appt:  10/10/2017  Kenzo Ozment L. Calil Amor, D.O. [facility] [address], [place] [address] Cell Phone (Mon-Fri 8am-5pm):  [phone] On Call:  [phone] & follow prompts after 5pm & weekends Office Phone:  [phone] Office Fax:  [phone]

## 2017-05-01 ENCOUNTER — Telehealth: Payer: Self-pay | Admitting: Internal Medicine

## 2017-05-01 DIAGNOSIS — H9113 Presbycusis, bilateral: Secondary | ICD-10-CM

## 2017-05-01 NOTE — Telephone Encounter (Signed)
Order placed

## 2017-05-01 NOTE — Telephone Encounter (Signed)
Patient was called to let her know Dr Mariea Clonts signed off on her referral; completed and has been faxed to audiologist

## 2017-05-01 NOTE — Telephone Encounter (Signed)
Patient called requesting referral to Dr Zenia Resides; Audiologist [phone] for hearing test, patient is at [facility],

## 2017-05-09 ENCOUNTER — Encounter: Payer: Self-pay | Admitting: Allergy & Immunology

## 2017-05-09 ENCOUNTER — Ambulatory Visit (INDEPENDENT_AMBULATORY_CARE_PROVIDER_SITE_OTHER): Payer: Medicare Other | Admitting: Allergy & Immunology

## 2017-05-09 VITALS — BP 132/80 | HR 72 | Resp 17

## 2017-05-09 DIAGNOSIS — J302 Other seasonal allergic rhinitis: Secondary | ICD-10-CM

## 2017-05-09 DIAGNOSIS — J4540 Moderate persistent asthma, uncomplicated: Secondary | ICD-10-CM

## 2017-05-09 MED ORDER — FLUTICASONE FUROATE-VILANTEROL 100-25 MCG/INH IN AEPB: 1.0000 | INHALATION_SPRAY | Freq: Every day | RESPIRATORY_TRACT | 5 refills | Status: DC

## 2017-05-09 NOTE — Progress Notes (Signed)
FOLLOW UP  Date of Service/Encounter:  05/09/17   Assessment:   Moderate persistent asthma without complication  Seasonal allergic rhinitis  Plan/Recommendations:   1. Mild persistent asthma, uncomplicated - Lung testing looked MUCH better today, so I would remain on the Breo. - Although she feels no benefit from the addition of the medication, it has clearly improved her lung function.  - I did demonstrate again how to take the medication since she did seem somewhat confused about it.  - New sample provided and script sent in. - Continue with ProAir 2-4 puffs every 4-6 hours as needed.  2. Allergic rhinoconjunctivitis - Continue with fluticasone and azelastine (essentially the drugs in Dymista separated out): do one spray per nostril twice daily.  - Use Claritin (lortadine), but change to as needed.   3. Return in about 6 months (around 11/06/2017).  Subjective:   Christina Lawson is a 84 y.o. female presenting today for follow up of  Chief Complaint  Patient presents with  . Asthma    Christina Lawson has a history of the following: Patient Active Problem List   Diagnosis Date Noted  . Parkinsonism ([place]) 04/11/2017  . Mild persistent asthma without complication 64/40/3474  . Seasonal allergic rhinitis 04/05/2017  . Carpal tunnel syndrome, bilateral 07/03/2016  . Numbness and tingling in both hands 06/05/2016  . Drooling 04/12/2016  . Hyperglycemia 03/24/2015  . Senile osteopenia 03/24/2015  . 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 Murmur, cardiac 03/24/2015  . Conjunctivitis 01/12/2014  . Edema 08/20/2013  . Senile ecchymosis 06/23/2013  . Rash and nonspecific skin eruption 03/26/2013  . Pain in left knee 01/06/2013  . Osteopenia 12/16/2012  . Urinary frequency 09/23/2012  . Impacted cerumen 09/23/2012  . Essential hypertension   . Hyperlipidemia   . Tremor, essential   . Cardiomegaly - hypertensive 08/10/2010  . Asthma, intrinsic 05/31/2010  .  Dyspnea 05/31/2010    History obtained from: chart review and patient.  Christina Lawson's Primary Care Provider is Alferd Apa     Christina Lawson is a 84 y.o. female presenting for a follow up visit. She was last seen in January 2019. At that time, she was doing well symptomatically. However, her spirometric findings were markedly worse with FEV1 and FVC in the 30% range. We did administer a bronchodilator but there was no significant improvement. We added on Breo 100/25 one puff once daily in lieu of her Pulmicort to see if this would help her symptoms. For her allergic rhinitis, we continued her on Dymista separated as well as Claritin. We stopped the montelukast to try to help simplify her regimen.   Since the last visit, she has done well. She did not feel that the [facility] provided any improvement in her activity, but she continues to use it nonetheless. She was seen for a viral URI at the beginning of February and was treated with Sudafed by her PCP. She did not require steroids or antibiotics at that time. She has not been needing to use her rescue inhaler at all.   Rhinitis symptoms continue to be well controlled with the use of the nasal sprays. She is also on the Claritin, which she takes daily as needed.   Otherwise, there have been no changes to her past medical history, surgical history, family history, or social history. She is a retired Education officer, museum.     Review of Systems: a 14-point review of systems is pertinent for what is mentioned in HPI.  Otherwise, all  other systems were negative. Constitutional: negative other than that listed in the HPI Eyes: negative other than that listed in the HPI Ears, nose, mouth, throat, and face: negative other than that listed in the HPI Respiratory: negative other than that listed in the HPI Cardiovascular: negative other than that listed in the HPI Gastrointestinal: negative other than that listed in the HPI Genitourinary: negative other  than that listed in the HPI Integument: negative other than that listed in the HPI Hematologic: negative other than that listed in the HPI Musculoskeletal: negative other than that listed in the HPI Neurological: negative other than that listed in the HPI Allergy/Immunologic: negative other than that listed in the HPI    Objective:   Blood pressure 132/80, pulse 72, resp. rate 17, SpO2 95 %. There is no height or weight on file to calculate BMI.   Physical Exam:  General: Alert, interactive, in no acute distress. Pleasant female.  Eyes: No conjunctival injection bilaterally, no discharge on the right, no discharge on the left and no Horner-Trantas dots present. PERRL bilaterally. EOMI without pain. No photophobia.  Ears: Right TM pearly gray with normal light reflex, Left TM pearly gray with normal light reflex, Right TM intact without perforation and Left TM intact without perforation.  Nose/Throat: External nose within normal limits and septum midline. Turbinates edematous without discharge. Posterior oropharynx mildly erythematous without cobblestoning in the posterior oropharynx. Tonsils 2+ without exudates.  Tongue without thrush and Geographic tongue present. Lungs: Clear to auscultation without wheezing, rhonchi or rales. No increased work of breathing. CV: Normal S1/S2. No murmurs. Capillary refill <2 seconds.  Skin: Warm and dry, without lesions or rashes. Neuro:   Grossly intact. No focal deficits appreciated. Responsive to questions.  Diagnostic studies:   Spirometry: results normal (FEV1: 1.02/81%, FVC: 1.23/71%, FEV1/FVC: 83%).    Spirometry consistent with normal pattern.  Allergy Studies: none        Salvatore Marvel, MD [facility]

## 2017-05-09 NOTE — Patient Instructions (Addendum)
1. Mild persistent asthma, uncomplicated - Lung testing looked MUCH better today, so I would remain on the Breo. - Be sure to use it every day and inhale the medication for the best effect.  - Continue with ProAir 2-4 puffs every 4-6 hours as needed.  2. Allergic rhinoconjunctivitis - Continue with fluticasone and azelastine (essentially the drugs in Dymista separated out): do one spray per nostril twice daily.  - Use Claritin (lortadine), but change to as needed.   3. Return in about 6 months (around 11/06/2017).  Please inform us of any Emergency Department visits, hospitalizations, or changes in symptoms. Call us before going to the ED for breathing or allergy symptoms since we might be able to fit you in for a sick visit. Feel free to contact us anytime with any questions, problems, or concerns.  It was a pleasure to see you again today!   Websites that have reliable patient information: 1. American Academy of Asthma, Allergy, and Immunology: www.aaaai.org 2. Food Allergy Research and Education (FARE): foodallergy.org 3. Mothers of Asthmatics: http://www.asthmacommunitynetwork.org 4. American College of Allergy, Asthma, and Immunology: www.acaai.org

## 2017-05-10 ENCOUNTER — Other Ambulatory Visit: Payer: Self-pay

## 2017-05-10 MED ORDER — FLUTICASONE FUROATE-VILANTEROL 100-25 MCG/INH IN AEPB: 1.0000 | INHALATION_SPRAY | Freq: Every day | RESPIRATORY_TRACT | 3 refills | Status: DC

## 2017-05-14 DIAGNOSIS — H903 Sensorineural hearing loss, bilateral: Secondary | ICD-10-CM | POA: Diagnosis not present

## 2017-05-29 DIAGNOSIS — L814 Other melanin hyperpigmentation: Secondary | ICD-10-CM | POA: Diagnosis not present

## 2017-05-29 DIAGNOSIS — L821 Other seborrheic keratosis: Secondary | ICD-10-CM | POA: Diagnosis not present

## 2017-05-29 DIAGNOSIS — D0471 Carcinoma in situ of skin of right lower limb, including hip: Secondary | ICD-10-CM | POA: Diagnosis not present

## 2017-07-11 DIAGNOSIS — Z961 Presence of intraocular lens: Secondary | ICD-10-CM | POA: Diagnosis not present

## 2017-07-11 DIAGNOSIS — H52203 Unspecified astigmatism, bilateral: Secondary | ICD-10-CM | POA: Diagnosis not present

## 2017-07-11 DIAGNOSIS — H353131 Nonexudative age-related macular degeneration, bilateral, early dry stage: Secondary | ICD-10-CM | POA: Diagnosis not present

## 2017-08-28 DIAGNOSIS — L814 Other melanin hyperpigmentation: Secondary | ICD-10-CM | POA: Diagnosis not present

## 2017-08-28 DIAGNOSIS — Z85828 Personal history of other malignant neoplasm of skin: Secondary | ICD-10-CM | POA: Diagnosis not present

## 2017-08-28 DIAGNOSIS — L821 Other seborrheic keratosis: Secondary | ICD-10-CM | POA: Diagnosis not present

## 2017-09-05 ENCOUNTER — Non-Acute Institutional Stay: Payer: Medicare Other | Admitting: Internal Medicine

## 2017-09-05 ENCOUNTER — Encounter: Payer: Self-pay | Admitting: Internal Medicine

## 2017-09-05 VITALS — BP 138/80 | HR 74 | Temp 97.7°F | Ht 60.0 in | Wt 130.0 lb

## 2017-09-05 DIAGNOSIS — R200 Anesthesia of skin: Secondary | ICD-10-CM

## 2017-09-05 DIAGNOSIS — F419 Anxiety disorder, unspecified: Secondary | ICD-10-CM | POA: Diagnosis not present

## 2017-09-05 DIAGNOSIS — G20C Parkinsonism, unspecified: Secondary | ICD-10-CM

## 2017-09-05 DIAGNOSIS — G20 Parkinson's disease: Secondary | ICD-10-CM | POA: Diagnosis not present

## 2017-09-05 DIAGNOSIS — G5603 Carpal tunnel syndrome, bilateral upper limbs: Secondary | ICD-10-CM

## 2017-09-05 NOTE — Progress Notes (Signed)
Location:  [place] of Service:  Clinic (12)  Provider: Makana Feigel L. Mariea Clonts, D.O., C.M.D.  Code Status: DNR Goals of Care:  Advanced Directives 09/05/2017  Does Patient Have a Medical Advance Directive? Yes  Type of Paramedic of [place];Living will;Out of facility DNR (pink MOST or yellow form)  Does patient want to make changes to medical advance directive? No - Patient declined  Copy of [place] in Chart? Yes  Pre-existing out of facility DNR order (yellow form or pink MOST form) Yellow form placed in chart (order not valid for inpatient use)   Chief Complaint  Patient presents with  . Acute Visit    tingling in left hand and foot on saturday night, has had before    HPI: Patient is a 84 y.o. female with h/o carpal tunnel syndrome and essential tremor, moderate asthma, parkinsonism (not PD), cardiomegaly, hyperglycemia and hyperlipidemia seen today for an acute visit for tingling in her left hand and foot that occurred Saturday night.  The nurse on call told her to take a xanax and it helped her.  Her next appt with neurology is in October.  She describes that first, her left foot was numb and it moved up to her thigh.  She could not feel the leg when she walked on it, but had no weakness in the leg and was able to walk without difficulty.    She then went to sleep.  She woke up and her left arm was numb.  She then called the nurse and was evaluated and no other deficits were found.  She reports having had no difficulty with changes in sight, speech, numbness of her face, or weakness.  She's had no confusion.  She admits to a lot of stress as her brother was recently diagnosed with bladder cancer and is currently hospitalized in the ICU.  She's had no palpitations and no h/o afib. BP has run from 130s to 924Q max systolic.    We discussed a workup to r/o prior strokes as it's unclear if this was an atypical TIA--this would include a CT  scan and carotid dopplers.  She is hesitant to get the tests, but did agree.  We also reviewed her MOST form which indicates DNR, she would be ok with hospitalization for a treatable condition but she does not want heroics like ICU care, invasive procedures, does not want tube feeding and does not want to be in pain.  She has selected abx and fluids short term if needed.  We will formally review this at her July checkup after we have the imaging results. I suggested she invite her children to that visit so I can meet them.    Past Medical History:  Diagnosis Date  . Allergic rhinitis    pollen and grass  . Asthma   . Blepharitis   . Breech delivery 1962  . Carpal tunnel syndrome, bilateral 07/03/2016  . Delivery normal 1960  . Hyperlipidemia   . Irregular heart rhythm   . Macular degeneration 2012  . Movement disorder    Tremor  . Osteoporosis   . Sensorineural hearing loss, bilateral 2007  . Shingles 2004  . Skin cancer 2005   nose  . Torn meniscus 2006   right knee  . Tremor 2006   familiar.  dx by Dr. Quillian Quince  . Unspecified essential hypertension   . Unspecified hypothyroidism   . Urinary frequency     Past Surgical  History:  Procedure Laterality Date  . ABDOMINAL HYSTERECTOMY  1977  . CATARACT EXTRACTION  1974   Left, IOL implants  . CATARACT EXTRACTION Right 1979   IOL implants  . CHOLECYSTECTOMY  1981  . COSMETIC SURGERY  1995   eye  . [place]  . DILATION AND CURETTAGE OF UTERUS  1977  . [place]  . HIATAL HERNIA REPAIR  1977  . SKIN CANCER EXCISION  2005   nose  . THYROIDECTOMY  1955  . TONSILLECTOMY  1931    Allergies  Allergen Reactions  . Clindamycin/Lincomycin     diarrhea  . Ibuprofen   . Penicillins     Outpatient Encounter Medications as of 09/05/2017  Medication Sig  . albuterol (PROAIR HFA) 108 (90 Base) MCG/ACT inhaler Inhale 2 puffs into the lungs every 4 (four) hours as needed for wheezing or shortness of  breath.  . ALPRAZolam (XANAX) 0.25 MG tablet Take 1 tablet (0.25 mg total) by mouth daily as needed.  . Azelastine HCl 0.15 % SOLN Place 1 spray into the nose 2 (two) times daily.  . Biotin 1000 MCG tablet Take 1,000 mcg by mouth daily. Reported on 04/15/2015  . calcium carbonate (TUMS - DOSED IN MG ELEMENTAL CALCIUM) 500 MG chewable tablet Chew 1 tablet by mouth daily.  . Cholecalciferol (VITAMIN D) 2000 units CAPS Take 1 capsule (2,000 Units total) by mouth daily.  . Diclofenac Sodium 1 % CREA Place 1 application onto the skin as needed.  . docusate sodium (COLACE) 100 MG capsule Take 200 mg by mouth daily as needed.    . fluticasone (FLONASE) 50 MCG/ACT nasal spray Place 1 spray into both nostrils 2 (two) times daily.  . fluticasone furoate-vilanterol (BREO ELLIPTA) 100-25 MCG/INH AEPB Inhale 1 puff into the lungs daily.  Marland Kitchen loratadine (CLARITIN) 10 MG tablet Take 10 mg by mouth daily. As needed  . minoxidil (ROGAINE) 2 % external solution Apply topically 2 (two) times daily.    . Saline (SIMPLY SALINE) 0.9 % AERS 1 spray by Nasal route 2 (two) times daily.    . traMADol (ULTRAM) 50 MG tablet Take 1 tablet (50 mg total) by mouth 3 (three) times daily as needed.   No facility-administered encounter medications on file as of 09/05/2017.     Review of Systems:  Review of Systems  Constitutional: Negative for chills, fever and malaise/fatigue.  HENT: Positive for hearing loss. Negative for congestion.        Hearing aids  Eyes:       Glasses, right eye tearing  Respiratory: Negative for cough and shortness of breath.   Cardiovascular: Negative for chest pain, palpitations and leg swelling.  Gastrointestinal: Negative for abdominal pain.  Genitourinary: Negative for dysuria.  Musculoskeletal: Negative for falls.  Skin: Negative for itching and rash.  Neurological: Positive for tremors and sensory change. Negative for dizziness, tingling, speech change, focal weakness, seizures, loss of  consciousness, weakness and headaches.  Endo/Heme/Allergies: Bruises/bleeds easily.  Psychiatric/Behavioral: Negative for depression and memory loss. The patient is nervous/anxious.     Health Maintenance  Topic Date Due  . TETANUS/TDAP  11/14/1942  . INFLUENZA VACCINE  10/11/2017  . DEXA SCAN  Completed  . PNA vac Low Risk Adult  Completed    Physical Exam: Vitals:   09/05/17 1431  BP: 138/80  Pulse: 74  Temp: 97.7 F (36.5 C)  TempSrc: Oral  SpO2: 98%  Weight: 130 lb (59 kg)  Height: 5' (  1.524 m)   Body mass index is 25.39 kg/m. Physical Exam  Constitutional: She is oriented to person, place, and time. She appears well-developed and well-nourished. No distress.  HENT:  Head: Normocephalic and atraumatic.  HOH, wearing hearing aids  Eyes: Pupils are equal, round, and reactive to light. EOM are normal.  Tearing from right eye; glasses  Cardiovascular: Normal rate, regular rhythm and intact distal pulses.  Murmur heard. Pulmonary/Chest: Effort normal and breath sounds normal. No respiratory distress.  Musculoskeletal: Normal range of motion.  Neurological: She is alert and oriented to person, place, and time. She displays normal reflexes. No cranial nerve deficit or sensory deficit. She exhibits normal muscle tone. Coordination normal.  Resting tremor  Skin: Skin is warm and dry.  Psychiatric: She has a normal mood and affect.  Anxious, jittery, tremulous    Labs reviewed: Basic Metabolic Panel: Recent Labs    09/26/16 0800  NA 143  K 4.9  BUN 14  CREATININE 0.7  TSH 4.21   Liver Function Tests: No results for input(s): AST, ALT, ALKPHOS, BILITOT, PROT, ALBUMIN in the last 8760 hours. No results for input(s): LIPASE, AMYLASE in the last 8760 hours. No results for input(s): AMMONIA in the last 8760 hours. CBC: Recent Labs    09/26/16 0800  WBC 4.5  HGB 15.4  HCT 46  PLT 234   Lipid Panel: Recent Labs    09/26/16 0800  CHOL 254*  HDL 54  LDLCALC  173  TRIG 136   Lab Results  Component Value Date   HGBA1C 5.7 09/26/2016    Assessment/Plan 1. Numbness in left leg -resolved now, ? TIA, check CT brain and carotid dopplers -last LDL quite high  2. Arm numbness left -resolved now, ? TIA, check CT brain and carotid dopplers  3. Parkinsonism, unspecified Parkinsonism type ([place]) -ongoing, following with Dr. Jannifer Franklin, not felt to be PD, but monitoring to ensure does not progress into PD, has xanax for anxiety and tremor which helps and also helped numbness  4. Carpal tunnel syndrome, bilateral -following with neurology, Dr. Jannifer Franklin, sees next in Oct  5. Anxiety -worse, it seems; has ill brother now and admits she has trouble controlling her stress -cont prn xanax  Labs/tests ordered:   Orders Placed This Encounter  Procedures  . CT Head Wo Contrast    Standing Status:   Future    Standing Expiration Date:   12/07/2018    Order Specific Question:   Preferred imaging location?    Answer:   [id number]    Order Specific Question:   Radiology Contrast Protocol - do NOT remove file path    Answer:   [email or web address]  . US Carotid Bilateral    Standing Status:   Future    Standing Expiration Date:   11/06/2018    Order Specific Question:   Preferred imaging location?    Answer:   [facility]    Next appt:  10/10/2017--keep as scheduled  Takeyla Million L. Leahanna Buser, D.O. [facility] [address], [place] [address] Cell Phone (Mon-Fri 8am-5pm):  [phone] On Call:  [phone] & follow prompts after 5pm & weekends Office Phone:  [phone] Office Fax:  [phone]

## 2017-09-12 ENCOUNTER — Encounter: Payer: Self-pay | Admitting: Internal Medicine

## 2017-09-18 ENCOUNTER — Ambulatory Visit
Admission: RE | Admit: 2017-09-18 | Discharge: 2017-09-18 | Disposition: A | Discharge status: Home / Self Care | Payer: Medicare Other | Source: Ambulatory Visit | Attending: Internal Medicine | Admitting: Internal Medicine

## 2017-09-18 DIAGNOSIS — G459 Transient cerebral ischemic attack, unspecified: Secondary | ICD-10-CM | POA: Diagnosis not present

## 2017-09-18 DIAGNOSIS — R200 Anesthesia of skin: Secondary | ICD-10-CM

## 2017-09-18 DIAGNOSIS — G20 Parkinson's disease: Secondary | ICD-10-CM

## 2017-09-20 ENCOUNTER — Other Ambulatory Visit: Payer: Self-pay

## 2017-09-20 ENCOUNTER — Telehealth: Payer: Self-pay | Admitting: Allergy & Immunology

## 2017-09-20 ENCOUNTER — Ambulatory Visit
Admission: RE | Admit: 2017-09-20 | Discharge: 2017-09-20 | Disposition: A | Discharge status: Home / Self Care | Payer: Medicare Other | Source: Ambulatory Visit | Attending: Internal Medicine | Admitting: Internal Medicine

## 2017-09-20 DIAGNOSIS — R200 Anesthesia of skin: Secondary | ICD-10-CM

## 2017-09-20 DIAGNOSIS — I6523 Occlusion and stenosis of bilateral carotid arteries: Secondary | ICD-10-CM | POA: Diagnosis not present

## 2017-09-20 MED ORDER — FLUTICASONE PROPIONATE 50 MCG/ACT NA SUSP: 1.0000 | Freq: Two times a day (BID) | NASAL | 5 refills | Status: DC

## 2017-09-20 MED ORDER — AZELASTINE HCL 0.15 % NA SOLN: 1.0000 | Freq: Two times a day (BID) | NASAL | 5 refills | Status: DC

## 2017-09-20 MED ORDER — AZELASTINE HCL 0.15 % NA SOLN: 1.0000 | Freq: Two times a day (BID) | NASAL | 0 refills | Status: DC

## 2017-09-20 MED ORDER — FLUTICASONE PROPIONATE 50 MCG/ACT NA SUSP: 2.0000 | Freq: Every day | NASAL | 2 refills | Status: DC

## 2017-09-20 NOTE — Telephone Encounter (Signed)
Sent refills in for Fluticasone and Azelastine for 90 day supply to [facility]

## 2017-09-20 NOTE — Telephone Encounter (Signed)
Pt called and needs to have Fluticasone and Azelastine for 3 month supply, [facility]. [phone]

## 2017-09-20 NOTE — Telephone Encounter (Signed)
Refills for Azelastine and Fluticasone sent into [facility] for a 90 day supply.

## 2017-09-26 ENCOUNTER — Telehealth: Payer: Self-pay | Admitting: Internal Medicine

## 2017-09-26 NOTE — Telephone Encounter (Signed)
I left a message asking the patient to call me at [phone] to schedule AWV with Sara at [facility] on 10/02/17 if available. VDM (DD) °

## 2017-10-02 ENCOUNTER — Encounter: Payer: Self-pay | Admitting: Internal Medicine

## 2017-10-02 DIAGNOSIS — E785 Hyperlipidemia, unspecified: Secondary | ICD-10-CM | POA: Diagnosis not present

## 2017-10-02 DIAGNOSIS — G20 Parkinson's disease: Secondary | ICD-10-CM | POA: Diagnosis not present

## 2017-10-02 DIAGNOSIS — D649 Anemia, unspecified: Secondary | ICD-10-CM | POA: Diagnosis not present

## 2017-10-02 DIAGNOSIS — E119 Type 2 diabetes mellitus without complications: Secondary | ICD-10-CM | POA: Diagnosis not present

## 2017-10-02 DIAGNOSIS — M8580 Other specified disorders of bone density and structure, unspecified site: Secondary | ICD-10-CM | POA: Diagnosis not present

## 2017-10-02 DIAGNOSIS — R739 Hyperglycemia, unspecified: Secondary | ICD-10-CM | POA: Diagnosis not present

## 2017-10-02 LAB — BASIC METABOLIC PANEL
BUN: 12 (ref 4–21)
Creatinine: 0.6 (ref 0.5–1.1)
Glucose: 98
Potassium: 4.7 (ref 3.4–5.3)
Sodium: 143 (ref 137–147)

## 2017-10-02 LAB — CBC AND DIFFERENTIAL
HCT: 42 (ref 36–46)
Hemoglobin: 14.1 (ref 12.0–16.0)
Platelets: 222 (ref 150–399)
WBC: 5.6

## 2017-10-02 LAB — LIPID PANEL
Cholesterol: 230 — AB (ref 0–200)
HDL: 63 (ref 35–70)
LDL Cholesterol: 144
Triglycerides: 116 (ref 40–160)

## 2017-10-02 LAB — HEPATIC FUNCTION PANEL
ALT: 18 (ref 7–35)
AST: 23 (ref 13–35)
Alkaline Phosphatase: 66 (ref 25–125)
Bilirubin, Total: 0.4

## 2017-10-02 LAB — HEMOGLOBIN A1C: Hemoglobin A1C: 5.9

## 2017-10-04 ENCOUNTER — Encounter: Payer: Self-pay | Admitting: Internal Medicine

## 2017-10-10 ENCOUNTER — Non-Acute Institutional Stay: Payer: Medicare Other | Admitting: Internal Medicine

## 2017-10-10 ENCOUNTER — Encounter: Payer: Self-pay | Admitting: Internal Medicine

## 2017-10-10 VITALS — BP 150/80 | HR 82 | Temp 97.6°F | Ht 60.0 in | Wt 130.0 lb

## 2017-10-10 DIAGNOSIS — E7800 Pure hypercholesterolemia, unspecified: Secondary | ICD-10-CM

## 2017-10-10 DIAGNOSIS — F419 Anxiety disorder, unspecified: Secondary | ICD-10-CM | POA: Insufficient documentation

## 2017-10-10 DIAGNOSIS — M79652 Pain in left thigh: Secondary | ICD-10-CM | POA: Diagnosis not present

## 2017-10-10 DIAGNOSIS — G20 Parkinson's disease: Secondary | ICD-10-CM

## 2017-10-10 DIAGNOSIS — G20C Parkinsonism, unspecified: Secondary | ICD-10-CM

## 2017-10-10 DIAGNOSIS — Z8673 Personal history of transient ischemic attack (TIA), and cerebral infarction without residual deficits: Secondary | ICD-10-CM | POA: Diagnosis not present

## 2017-10-10 NOTE — Progress Notes (Signed)
Location:  [facility] clinic Provider:  Constantin Hillery L. Mariea Clonts, D.O., C.M.D.  Code Status: DNR Goals of Care:  Advanced Directives 09/05/2017  Does Patient Have a Medical Advance Directive? Yes  Type of Paramedic of [place];Living will;Out of facility DNR (pink MOST or yellow form)  Does patient want to make changes to medical advance directive? No - Patient declined  Copy of [place] in Chart? Yes  Pre-existing out of facility DNR order (yellow form or pink MOST form) Yellow form placed in chart (order not valid for inpatient use)   Chief Complaint  Patient presents with  . Medical Management of Chronic Issues    9mth follow-up    HPI: Patient is a 84 y.o. female seen today for medical management of chronic diseases.  Her daughter Shauna Hugh is here with her.    We are meeting to review her CT scan of her brain and her carotid US.  She had a tiny noncompressive frontal meningioma and a tiny insular stroke from the past, mild chronic ischemic changes and atrophy.    She used to be on cholesterol and bp medication.  LDL remains 144 but is down from 173 a year ago.  HDL is 63.  BP is well controlled.  She gets anxious coming in here.    hba1c is in the prediabetic range.  Reviewed dietary changes needed to decrease stroke risk.  Pt does not want to resume medications for cholesterol at her age unless she cannot control it with diet.  Her daughter points out that pt thinks she watches what she eats but she really does not do as well as she thinks--eats more fried foods and sweets than she realizes.  Reviewed MOST form and updated it--pt requests no IVFs at end of life.  Past Medical History:  Diagnosis Date  . Allergic rhinitis    pollen and grass  . Asthma   . Blepharitis   . Breech delivery 1962  . Carpal tunnel syndrome, bilateral 07/03/2016  . Delivery normal 1960  . Hyperlipidemia   . Irregular heart rhythm   . Macular degeneration 2012  .  Movement disorder    Tremor  . Osteoporosis   . Sensorineural hearing loss, bilateral 2007  . Shingles 2004  . Skin cancer 2005   nose  . Torn meniscus 2006   right knee  . Tremor 2006   familiar.  dx by Dr. Quillian Quince  . Unspecified essential hypertension   . Unspecified hypothyroidism   . Urinary frequency     Past Surgical History:  Procedure Laterality Date  . ABDOMINAL HYSTERECTOMY  1977  . CATARACT EXTRACTION  1974   Left, IOL implants  . CATARACT EXTRACTION Right 1979   IOL implants  . CHOLECYSTECTOMY  1981  . COSMETIC SURGERY  1995   eye  . [place]  . DILATION AND CURETTAGE OF UTERUS  1977  . [place]  . HIATAL HERNIA REPAIR  1977  . SKIN CANCER EXCISION  2005   nose  . THYROIDECTOMY  1955  . TONSILLECTOMY  1931    Allergies  Allergen Reactions  . Clindamycin/Lincomycin     diarrhea  . Ibuprofen   . Penicillins     Outpatient Encounter Medications as of 10/10/2017  Medication Sig  . albuterol (PROAIR HFA) 108 (90 Base) MCG/ACT inhaler Inhale 2 puffs into the lungs every 4 (four) hours as needed for wheezing or shortness of breath.  . ALPRAZolam (  XANAX) 0.25 MG tablet Take 1 tablet (0.25 mg total) by mouth daily as needed.  . Azelastine HCl 0.15 % SOLN Place 1 spray into the nose 2 (two) times daily.  . Biotin 1000 MCG tablet Take 1,000 mcg by mouth daily. Reported on 04/15/2015  . calcium carbonate (TUMS - DOSED IN MG ELEMENTAL CALCIUM) 500 MG chewable tablet Chew 1 tablet by mouth daily.  . Cholecalciferol (VITAMIN D) 2000 units CAPS Take 1 capsule (2,000 Units total) by mouth daily.  . Diclofenac Sodium 1 % CREA Place 1 application onto the skin as needed.  . docusate sodium (COLACE) 100 MG capsule Take 200 mg by mouth daily as needed.    . fluticasone (FLONASE) 50 MCG/ACT nasal spray Place 2 sprays into both nostrils daily.  . fluticasone furoate-vilanterol (BREO ELLIPTA) 100-25 MCG/INH AEPB Inhale 1 puff into the lungs  daily.  Marland Kitchen loratadine (CLARITIN) 10 MG tablet Take 10 mg by mouth daily. As needed  . minoxidil (ROGAINE) 2 % external solution Apply topically 2 (two) times daily.    . Saline (SIMPLY SALINE) 0.9 % AERS 1 spray by Nasal route 2 (two) times daily.    . traMADol (ULTRAM) 50 MG tablet Take 1 tablet (50 mg total) by mouth 3 (three) times daily as needed.   No facility-administered encounter medications on file as of 10/10/2017.     Review of Systems:  Review of Systems  Constitutional: Negative for chills and fever.  HENT: Positive for hearing loss. Negative for congestion.   Eyes: Positive for blurred vision.  Respiratory: Negative for cough and shortness of breath.   Cardiovascular: Negative for chest pain, palpitations and leg swelling.  Gastrointestinal: Negative for abdominal pain, blood in stool, constipation, diarrhea and melena.  Genitourinary: Negative for dysuria.  Musculoskeletal: Negative for falls and joint pain.  Skin: Negative for itching and rash.  Neurological: Positive for tremors. Negative for dizziness and loss of consciousness.  Psychiatric/Behavioral: Positive for memory loss. Negative for depression. The patient is nervous/anxious. The patient does not have insomnia.        Mild memory loss with age-related changes    Health Maintenance  Topic Date Due  . TETANUS/TDAP  11/14/1942  . INFLUENZA VACCINE  10/11/2017  . DEXA SCAN  Completed  . PNA vac Low Risk Adult  Completed    Physical Exam: Vitals:   10/10/17 0832  Weight: 130 lb (59 kg)  Height: 5' (1.524 m)   Body mass index is 25.39 kg/m. Physical Exam  Constitutional: She is oriented to person, place, and time. She appears well-developed and well-nourished. No distress.  HENT:  Head: Normocephalic and atraumatic.  Eyes:  glasses  Cardiovascular: Normal rate, regular rhythm and intact distal pulses.  Murmur heard. Pulmonary/Chest: Effort normal and breath sounds normal. No respiratory distress.    Abdominal: Bowel sounds are normal.  Musculoskeletal: Normal range of motion.  Neurological: She is alert and oriented to person, place, and time.  Resting tremor  Skin: Skin is warm and dry. Capillary refill takes less than 2 seconds.  Psychiatric: She has a normal mood and affect.    Labs reviewed: Basic Metabolic Panel: Recent Labs    10/02/17 0600  NA 143  K 4.7  BUN 12  CREATININE 0.6   Liver Function Tests: Recent Labs    10/02/17 0600  AST 23  ALT 18  ALKPHOS 66   No results for input(s): LIPASE, AMYLASE in the last 8760 hours. No results for input(s): AMMONIA in the  last 8760 hours. CBC: Recent Labs    10/02/17 0600  WBC 5.6  HGB 14.1  HCT 42  PLT 222   Lipid Panel: Recent Labs    10/02/17 0600  CHOL 230*  HDL 63  LDLCALC 144  TRIG 116   Lab Results  Component Value Date   HGBA1C 5.9 10/02/2017    Procedures since last visit: Ct Head Wo Contrast  Result Date: 09/18/2017 CLINICAL DATA:  84 year old female with TIA symptoms onset 2 weeks ago. Numbness with foot asleep. Left hemiparesis, weakness and numbness and tingling. Initial encounter. EXAM: CT HEAD WITHOUT CONTRAST TECHNIQUE: Contiguous axial images were obtained from the base of the skull through the vertex without intravenous contrast. COMPARISON:  None. FINDINGS: Brain: Remote small infarct posterior left lenticular nucleus. Prominent chronic microvascular changes. No intracranial hemorrhage or CT evidence of large acute infarct. Mild global atrophy. Left convexity calvarial based very small meningioma without associated mass effect suspected. Otherwise no evidence of intracranial mass lesion noted on this unenhanced exam. Vascular: Vascular calcifications Skull: Negative Sinuses/Orbits: No acute orbital abnormality. Visualized paranasal sinuses are clear. Other: Mastoid air cells and middle ear cavities are clear. IMPRESSION: No acute intracranial abnormality noted. Remote tiny posterior left  lenticular nucleus infarct. Prominent chronic microvascular changes. Global atrophy. Left convexity tiny calcified meningioma without associated mass effect. Electronically Signed   By: Genia Del M.D.   On: 09/18/2017 11:33   US Carotid Bilateral  Result Date: 09/20/2017 CLINICAL DATA:  Left arm numbness EXAM: BILATERAL CAROTID DUPLEX ULTRASOUND TECHNIQUE: Pearline Cables scale imaging, color Doppler and duplex ultrasound were performed of bilateral carotid and vertebral arteries in the neck. COMPARISON:  None. FINDINGS: Criteria: Quantification of carotid stenosis is based on velocity parameters that correlate the residual internal carotid diameter with NASCET-based stenosis levels, using the diameter of the distal internal carotid lumen as the denominator for stenosis measurement. The following velocity measurements were obtained: RIGHT ICA:  43 cm/sec CCA:  65 cm/sec SYSTOLIC ICA/CCA RATIO:  0.7 DIASTOLIC ICA/CCA RATIO: ECA:  48 cm/sec LEFT ICA:  61 cm/sec CCA:  65 cm/sec SYSTOLIC ICA/CCA RATIO:  0.9 DIASTOLIC ICA/CCA RATIO: ECA:  84 cm/sec RIGHT CAROTID ARTERY: Moderate smooth mixed plaque in the bulb. Low resistance internal carotid Doppler pattern is preserved. RIGHT VERTEBRAL ARTERY:  Antegrade. LEFT CAROTID ARTERY: There is moderate irregular mixed plaque in the bulb. Low resistance internal carotid Doppler pattern is preserved. The internal carotid is very tortuous. LEFT VERTEBRAL ARTERY:  Antegrade. IMPRESSION: Less than 50% stenosis in the right and left internal carotid arteries. Electronically Signed   By: Marybelle Killings M.D.   On: 09/20/2017 16:02    Assessment/Plan 1. H/O TIA (transient ischemic attack) and stroke -reviewed imaging with pt and her daughter today -counseled on secondary prevention w/ bp, lipid, glucose control--healthy lifestyle choices -she does exercise with water aerobics, walking -she will work on improving her cholesterol with improved dietary choices with limited fried foods,  sweets -reviewed importance of calling the nurse if stroke symptoms recur  2. Pure hypercholesterolemia -work on diet which is pt's preference over resuming medications which were stopped several years ago -did review that LDL is twice normal so it will definitely take work -pt willing to take meds if cholesterol not better at 6 mo check  3. Parkinsonism, unspecified Parkinsonism type ([place]) -not consistent with primary PD  4. Anxiety -cont xanax as needed   5.  Left thigh pain -suspect radicular from lower back -bothersome with walking, but feels better  with water aerobics so encouraged this -uses tramadol when severe  Labs/tests ordered:  F/u flp, bmp before next visit Next appt:  6 mos    Juli Odom L. Carvel Huskins, D.O. [facility] [address], [place] [address] Cell Phone (Mon-Fri 8am-5pm):  [phone] On Call:  [phone] & follow prompts after 5pm & weekends Office Phone:  [phone] Office Fax:  [phone]

## 2017-11-05 ENCOUNTER — Encounter: Payer: Self-pay | Admitting: Allergy & Immunology

## 2017-11-05 ENCOUNTER — Ambulatory Visit (INDEPENDENT_AMBULATORY_CARE_PROVIDER_SITE_OTHER): Payer: Medicare Other | Admitting: Allergy & Immunology

## 2017-11-05 VITALS — BP 128/78 | HR 67 | Resp 16 | Ht 60.25 in | Wt 130.6 lb

## 2017-11-05 DIAGNOSIS — J4540 Moderate persistent asthma, uncomplicated: Secondary | ICD-10-CM | POA: Diagnosis not present

## 2017-11-05 DIAGNOSIS — J302 Other seasonal allergic rhinitis: Secondary | ICD-10-CM | POA: Diagnosis not present

## 2017-11-05 NOTE — Progress Notes (Signed)
FOLLOW UP  Date of Service/Encounter:  11/05/17   Assessment:   Moderate persistent asthma without complication   Seasonal allergic rhinitis  Recent TIA   Asthma Reportables:  Severity: moderate persistent  Risk: low Control: well controlled   Plan/Recommendations:   1. Mild persistent asthma, uncomplicated - Lung testing looked great today. - Continue with the Breo one puff once daily. - Continue with albuterol 2-4 puffs every 4-6 hours as needed.   2. Allergic rhinoconjunctivitis - Continue with fluticasone and azelastine (essentially the drugs in Dymista separated out): do one spray per nostril twice daily.  - Continue with Claritin (lortadine) as needed.   3. Return in about 6 months (around 05/08/2018).  Subjective:   Christina Lawson is a 84 y.o. female presenting today for follow up of  Chief Complaint  Patient presents with  . Follow-up    Asthma     [name] has a history of the following: Patient Active Problem List   Diagnosis Date Noted  . Left thigh pain 10/10/2017  . Anxiety 10/10/2017  . H/O TIA (transient ischemic attack) and stroke 10/10/2017  . Moderate persistent asthma without complication 40/10/6759  . Parkinsonism ([place]) 04/11/2017  . Mild persistent asthma without complication 95/11/3265  . Seasonal allergic rhinitis 04/05/2017  . Carpal tunnel syndrome, bilateral 07/03/2016  . Numbness and tingling in both hands 06/05/2016  . Drooling 04/12/2016  . Hyperglycemia 03/24/2015  . Senile osteopenia 03/24/2015  . 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 Murmur, cardiac 03/24/2015  . Conjunctivitis 01/12/2014  . Edema 08/20/2013  . Senile ecchymosis 06/23/2013  . Rash and nonspecific skin eruption 03/26/2013  . Pain in left knee 01/06/2013  . Osteopenia 12/16/2012  . Urinary frequency 09/23/2012  . Impacted cerumen 09/23/2012  . Essential hypertension   . Hyperlipidemia   . Tremor, essential   . Cardiomegaly -  hypertensive 08/10/2010  . Asthma, intrinsic 05/31/2010  . Dyspnea 05/31/2010    History obtained from: chart review and patient.  Jeanett Schlein Huether's Primary Care Provider is Alferd Apa     Christina Lawson is a 84 y.o. female presenting for a follow up visit.  She was last seen in February 2019.  At that time, her lung testing looked markedly improved.  I recommended continuing with Breo since this was doing much better.  For her allergic rhinoconjunctivitis, we continued fluticasone and as a lasting as well as Claritin.  We did change the Claritin to as needed instead.  Since the last visit, she has done well. In the interim, she was diagnosed with a TIA after awakening in the middle of the night with left sided numbness and tingling. She did undergo some testing which was negative. She was never placed on a blood thinner and she has since recovered.   Asthma/Respiratory Symptom History: She remains on the Breo one puff once daily. ACT is 25 today, indicating excellent asthma control. She has not needed any ED visits or hospitalizations for her breathing. She uses her rescue inhaler around once per week at the very most. She will have some trouble with catching her breath when she is active and running around. 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   Allergic Rhinitis Symptom History: Allergic rhinitis is well controlled with the use of the Dymista 1-2 sprays per nostril daily. She has had no intervening sinus infections. She is using the Claritin on a PRN basis.   Otherwise, there have been no changes to her past medical  history, surgical history, family history, or social history.    Review of Systems: a 14-point review of systems is pertinent for what is mentioned in HPI.  Otherwise, all other systems were negative. Constitutional: negative other than that listed in the HPI Eyes: negative other than that listed in the HPI Ears, nose, mouth,  throat, and face: negative other than that listed in the HPI Respiratory: negative other than that listed in the HPI Cardiovascular: negative other than that listed in the HPI Gastrointestinal: negative other than that listed in the HPI Genitourinary: negative other than that listed in the HPI Integument: negative other than that listed in the HPI Hematologic: negative other than that listed in the HPI Musculoskeletal: negative other than that listed in the HPI Neurological: negative other than that listed in the HPI Allergy/Immunologic: negative other than that listed in the HPI    Objective:   Blood pressure 128/78, pulse 67, resp. rate 16, height 5' 0.25" (1.53 m), weight 130 lb 9.6 oz (59.2 kg), SpO2 98 %. Body mass index is 25.29 kg/m.   Physical Exam:   General: Alert, interactive, in no acute distress. Adorable smiling female.  Eyes: No conjunctival injection bilaterally, no discharge on the right, no discharge on the left and no Horner-Trantas dots present. PERRL bilaterally. EOMI without pain. No photophobia.  Ears: Right TM pearly gray with normal light reflex, Left TM pearly gray with normal light reflex, Right TM intact without perforation and Left TM intact without perforation.  Nose/Throat: External nose within normal limits and septum midline. Turbinates edematous and pale with clear discharge. Posterior oropharynx erythematous with cobblestoning in the posterior oropharynx. Tonsils 2+ without exudates.  Tongue without thrush. Lungs: Clear to auscultation without wheezing, rhonchi or rales. No increased work of breathing. CV: Normal S1/S2. No murmurs. Capillary refill <2 seconds.  Skin: Warm and dry, without lesions or rashes. Neuro:   Grossly intact. No focal deficits appreciated. Responsive to questions.  Diagnostic studies:   Spirometry: results normal (FEV1: 1.15/100%, FVC: 1.44/89%, FEV1/FVC: 80%).    Spirometry consistent with normal pattern.   Allergy  Studies: none   Salvatore Marvel, MD  [facility]

## 2017-11-05 NOTE — Patient Instructions (Addendum)
1. Mild persistent asthma, uncomplicated - Lung testing looked great today. - Continue with the Breo one puff once daily. - Continue with albuterol 2-4 puffs every 4-6 hours as needed.   2. Allergic rhinoconjunctivitis - Continue with fluticasone and azelastine (essentially the drugs in Dymista separated out): do one spray per nostril twice daily.  - Continue with Claritin (lortadine) as needed.   3. Return in about 6 months (around 05/08/2018).  Please inform us of any Emergency Department visits, hospitalizations, or changes in symptoms. Call us before going to the ED for breathing or allergy symptoms since we might be able to fit you in for a sick visit. Feel free to contact us anytime with any questions, problems, or concerns.  It was a pleasure to see you again today! Keep up the great work!   Websites that have reliable patient information: 1. American Academy of Asthma, Allergy, and Immunology: www.aaaai.org 2. Food Allergy Research and Education (FARE): foodallergy.org 3. Mothers of Asthmatics: http://www.asthmacommunitynetwork.org 4. American College of Allergy, Asthma, and Immunology: www.acaai.org

## 2017-11-20 ENCOUNTER — Non-Acute Institutional Stay: Payer: Medicare Other

## 2017-11-20 VITALS — BP 128/70 | HR 80 | Temp 97.5°F | Ht 60.0 in | Wt 131.0 lb

## 2017-11-20 DIAGNOSIS — Z0000 Encounter for general adult medical examination without abnormal findings: Secondary | ICD-10-CM | POA: Diagnosis not present

## 2017-11-20 NOTE — Patient Instructions (Signed)
Christina Lawson , Thank you for taking time to come for your Medicare Wellness Visit. I appreciate your ongoing commitment to your health goals. Please review the following plan we discussed and let me know if I can assist you in the future.   Screening recommendations/referrals: Colonoscopy excluded, over age 82 Mammogram excluded, over age 39 Bone Density up to date Recommended yearly ophthalmology/optometry visit for glaucoma screening and checkup Recommended yearly dental visit for hygiene and checkup  Vaccinations: Influenza vaccine due, please get at [facility] Pneumococcal vaccine up to date, completed Tdap vaccine up to date, due 04/07/2025 Shingles vaccine up to date, completed    Advanced directives: in chart  Conditions/risks identified: none  Next appointment: Dr. Mariea Clonts 04/17/2018 @ 8:30am   Preventive Care 76 Years and Older, Female Preventive care refers to lifestyle choices and visits with your health care provider that can promote health and wellness. What does preventive care include?  A yearly physical exam. This is also called an annual well check.  Dental exams once or twice a year.  Routine eye exams. Ask your health care provider how often you should have your eyes checked.  Personal lifestyle choices, including:  Daily care of your teeth and gums.  Regular physical activity.  Eating a healthy diet.  Avoiding tobacco and drug use.  Limiting alcohol use.  Practicing safe sex.  Taking low-dose aspirin every day.  Taking vitamin and mineral supplements as recommended by your health care provider. What happens during an annual well check? The services and screenings done by your health care provider during your annual well check will depend on your age, overall health, lifestyle risk factors, and family history of disease. Counseling  Your health care provider may ask you questions about your:  Alcohol use.  Tobacco use.  Drug use.  Emotional  well-being.  Home and relationship well-being.  Sexual activity.  Eating habits.  History of falls.  Memory and ability to understand (cognition).  Work and work Statistician.  Reproductive health. Screening  You may have the following tests or measurements:  Height, weight, and BMI.  Blood pressure.  Lipid and cholesterol levels. These may be checked every 5 years, or more frequently if you are over 46 years old.  Skin check.  Lung cancer screening. You may have this screening every year starting at age 72 if you have a 30-pack-year history of smoking and currently smoke or have quit within the past 15 years.  Fecal occult blood test (FOBT) of the stool. You may have this test every year starting at age 35.  Flexible sigmoidoscopy or colonoscopy. You may have a sigmoidoscopy every 5 years or a colonoscopy every 10 years starting at age 7.  Hepatitis C blood test.  Hepatitis B blood test.  Sexually transmitted disease (STD) testing.  Diabetes screening. This is done by checking your blood sugar (glucose) after you have not eaten for a while (fasting). You may have this done every 1-3 years.  Bone density scan. This is done to screen for osteoporosis. You may have this done starting at age 89.  Mammogram. This may be done every 1-2 years. Talk to your health care provider about how often you should have regular mammograms. Talk with your health care provider about your test results, treatment options, and if necessary, the need for more tests. Vaccines  Your health care provider may recommend certain vaccines, such as:  Influenza vaccine. This is recommended every year.  Tetanus, diphtheria, and acellular pertussis (Tdap, Td)  vaccine. You may need a Td booster every 10 years.  Zoster vaccine. You may need this after age 42.  Pneumococcal 13-valent conjugate (PCV13) vaccine. One dose is recommended after age 4.  Pneumococcal polysaccharide (PPSV23) vaccine. One  dose is recommended after age 2. Talk to your health care provider about which screenings and vaccines you need and how often you need them. This information is not intended to replace advice given to you by your health care provider. Make sure you discuss any questions you have with your health care provider. Document Released: 03/26/2015 Document Revised: 11/17/2015 Document Reviewed: 12/29/2014 Elsevier Interactive Patient Education  2017 [place] Prevention in the Home Falls can cause injuries. They can happen to people of all ages. There are many things you can do to make your home safe and to help prevent falls. What can I do on the outside of my home?  Regularly fix the edges of walkways and driveways and fix any cracks.  Remove anything that might make you trip as you walk through a door, such as a raised step or threshold.  Trim any bushes or trees on the path to your home.  Use bright outdoor lighting.  Clear any walking paths of anything that might make someone trip, such as rocks or tools.  Regularly check to see if handrails are loose or broken. Make sure that both sides of any steps have handrails.  Any raised decks and porches should have guardrails on the edges.  Have any leaves, snow, or ice cleared regularly.  Use sand or salt on walking paths during winter.  Clean up any spills in your garage right away. This includes oil or grease spills. What can I do in the bathroom?  Use night lights.  Install grab bars by the toilet and in the tub and shower. Do not use towel bars as grab bars.  Use non-skid mats or decals in the tub or shower.  If you need to sit down in the shower, use a plastic, non-slip stool.  Keep the floor dry. Clean up any water that spills on the floor as soon as it happens.  Remove soap buildup in the tub or shower regularly.  Attach bath mats securely with double-sided non-slip rug tape.  Do not have throw rugs and other  things on the floor that can make you trip. What can I do in the bedroom?  Use night lights.  Make sure that you have a light by your bed that is easy to reach.  Do not use any sheets or blankets that are too big for your bed. They should not hang down onto the floor.  Have a firm chair that has side arms. You can use this for support while you get dressed.  Do not have throw rugs and other things on the floor that can make you trip. What can I do in the kitchen?  Clean up any spills right away.  Avoid walking on wet floors.  Keep items that you use a lot in easy-to-reach places.  If you need to reach something above you, use a strong step stool that has a grab bar.  Keep electrical cords out of the way.  Do not use floor polish or wax that makes floors slippery. If you must use wax, use non-skid floor wax.  Do not have throw rugs and other things on the floor that can make you trip. What can I do with my stairs?  Do not leave  any items on the stairs.  Make sure that there are handrails on both sides of the stairs and use them. Fix handrails that are broken or loose. Make sure that handrails are as long as the stairways.  Check any carpeting to make sure that it is firmly attached to the stairs. Fix any carpet that is loose or worn.  Avoid having throw rugs at the top or bottom of the stairs. If you do have throw rugs, attach them to the floor with carpet tape.  Make sure that you have a light switch at the top of the stairs and the bottom of the stairs. If you do not have them, ask someone to add them for you. What else can I do to help prevent falls?  Wear shoes that:  Do not have high heels.  Have rubber bottoms.  Are comfortable and fit you well.  Are closed at the toe. Do not wear sandals.  If you use a stepladder:  Make sure that it is fully opened. Do not climb a closed stepladder.  Make sure that both sides of the stepladder are locked into place.  Ask  someone to hold it for you, if possible.  Clearly mark and make sure that you can see:  Any grab bars or handrails.  First and last steps.  Where the edge of each step is.  Use tools that help you move around (mobility aids) if they are needed. These include:  Canes.  Walkers.  Scooters.  Crutches.  Turn on the lights when you go into a dark area. Replace any light bulbs as soon as they burn out.  Set up your furniture so you have a clear path. Avoid moving your furniture around.  If any of your floors are uneven, fix them.  If there are any pets around you, be aware of where they are.  Review your medicines with your doctor. Some medicines can make you feel dizzy. This can increase your chance of falling. Ask your doctor what other things that you can do to help prevent falls. This information is not intended to replace advice given to you by your health care provider. Make sure you discuss any questions you have with your health care provider. Document Released: 12/24/2008 Document Revised: 08/05/2015 Document Reviewed: 04/03/2014 Elsevier Interactive Patient Education  2017 [employer].

## 2017-11-20 NOTE — Progress Notes (Signed)
Subjective:   Christina Lawson is a 84 y.o. female who presents for Medicare Annual (Subsequent) preventive examination at [facility]  Last AWV-09/19/2016    Objective:     Vitals: BP 128/70 (BP Location: Left Arm, Patient Position: Sitting)   Pulse 80   Temp (!) 97.5 F (36.4 C) (Oral)   Ht 5' (1.524 m)   Wt 131 lb (59.4 kg)   SpO2 96%   BMI 25.58 kg/m   Body mass index is 25.58 kg/m.  Advanced Directives 11/20/2017 10/10/2017 09/05/2017 04/18/2017 04/11/2017 10/04/2016 09/19/2016  Does Patient Have a Medical Advance Directive? Yes Yes Yes Yes Yes Yes Yes  Type of Paramedic of [place];Living will;Out of facility DNR (pink MOST or yellow form) [place];Living will;Out of facility DNR (pink MOST or yellow form) [place];Living will;Out of facility DNR (pink MOST or yellow form) [place];Living will;Out of facility DNR (pink MOST or yellow form) [place];Living will;Out of facility DNR (pink MOST or yellow form) [place];Living will;Out of facility DNR (pink MOST or yellow form) [place];Living will;Out of facility DNR (pink MOST or yellow form)  Does patient want to make changes to medical advance directive? No - Patient declined No - Patient declined No - Patient declined No - Patient declined No - Patient declined - No - Patient declined  Copy of [place] in Chart? Yes Yes Yes Yes Yes Yes Yes  Pre-existing out of facility DNR order (yellow form or pink MOST form) Yellow form placed in chart (order not valid for inpatient use) Yellow form placed in chart (order not valid for inpatient use) Yellow form placed in chart (order not valid for inpatient use) Yellow form placed in chart (order not valid for inpatient use) Yellow form placed in chart (order not valid for inpatient use) Yellow form placed in chart  (order not valid for inpatient use) Pink MOST form placed in chart (order not valid for inpatient use)    Tobacco Social History   Tobacco Use  Smoking Status Never Smoker  Smokeless Tobacco Never Used     Counseling given: Not Answered   Clinical Intake:  Pre-visit preparation completed: No  Pain : No/denies pain     Nutritional Risks: None Diabetes: No  How often do you need to have someone help you when you read instructions, pamphlets, or other written materials from your doctor or pharmacy?: 1 - Never What is the last grade level you completed in school?: Masters  Interpreter Needed?: No  Information entered by :: Tyson Dense, RN  Past Medical History:  Diagnosis Date  . Allergic rhinitis    pollen and grass  . Asthma   . Blepharitis   . Breech delivery 1962  . Carpal tunnel syndrome, bilateral 07/03/2016  . Delivery normal 1960  . Hyperlipidemia   . Irregular heart rhythm   . Macular degeneration 2012  . Movement disorder    Tremor  . Osteoporosis   . Sensorineural hearing loss, bilateral 2007  . Shingles 2004  . Skin cancer 2005   nose  . Torn meniscus 2006   right knee  . Tremor 2006   familiar.  dx by Dr. Quillian Quince  . Unspecified essential hypertension   . Unspecified hypothyroidism   . Urinary frequency    Past Surgical History:  Procedure Laterality Date  . ABDOMINAL HYSTERECTOMY  1977  . CATARACT EXTRACTION  1974   Left, IOL implants  . CATARACT EXTRACTION Right 1979   IOL implants  . CHOLECYSTECTOMY  1981  . COSMETIC SURGERY  1995   eye  . [place]  . DILATION AND CURETTAGE OF UTERUS  1977  . [place]  . HIATAL HERNIA REPAIR  1977  . SKIN CANCER EXCISION  2005   nose  . THYROIDECTOMY  1955  . TONSILLECTOMY  1931   Family History  Problem Relation Age of Onset  . Emphysema Father   . Allergies Father   . Asthma Father   . Brain cancer Mother   . Cancer Mother        Brain  . Hypertension  Mother   . Cancer Sister        Cervical  . Endometrial cancer Sister   . Breast cancer Sister 43  . Cancer Sister        Breast, metastasized to bone  . Breast cancer Sister        Cher Nakai of age  . Breast cancer Daughter 61  . Heart disease Maternal Grandfather   . Stroke Unknown   . Breast cancer Maternal Aunt   . Breast cancer Paternal Aunt   . Breast cancer Cousin   . Breast cancer Maternal Aunt   . Breast cancer Cousin    Social History   Socioeconomic History  . Marital status: Single    Spouse name: Not on file  . Number of children: 2  . Years of education: college  . Highest education level: Not on file  Occupational History  . Occupation: retired Education officer, museum    Comment: retired  Scientific laboratory technician  . Financial resource strain: Not hard at all  . Food insecurity:    Worry: Never true    Inability: Never true  . Transportation needs:    Medical: No    Non-medical: No  Tobacco Use  . Smoking status: Never Smoker  . Smokeless tobacco: Never Used  Substance and Sexual Activity  . Alcohol use: Yes    Comment: on occ.  . Drug use: No  . Sexual activity: Never  Lifestyle  . Physical activity:    Days per week: 3 days    Minutes per session: 40 min  . Stress: Only a little  Relationships  . Social connections:    Talks on phone: More than three times a week    Gets together: More than three times a week    Attends religious service: Never    Active member of club or organization: No    Attends meetings of clubs or organizations: Never    Relationship status: Widowed  Other Topics Concern  . Not on file  Social History Narrative   Lives at [place]. Married 1958.   Never smoked   Alcohol minimal   Exercise predominantly walking, water aerobic   Retired Education officer, museum       Outpatient Encounter Medications as of 11/20/2017  Medication Sig  . albuterol (PROAIR HFA) 108 (90 Base) MCG/ACT inhaler Inhale 2 puffs into the lungs every 4  (four) hours as needed for wheezing or shortness of breath.  . ALPRAZolam (XANAX) 0.25 MG tablet Take 1 tablet (0.25 mg total) by mouth daily as needed.  . Azelastine HCl 0.15 % SOLN Place 1 spray into the nose 2 (two) times daily.  . Biotin 1000 MCG tablet Take 1,000 mcg by mouth daily. Reported on 04/15/2015  .  calcium carbonate (TUMS - DOSED IN MG ELEMENTAL CALCIUM) 500 MG chewable tablet Chew 1 tablet by mouth daily.  . Cholecalciferol (VITAMIN D) 2000 units CAPS Take 1 capsule (2,000 Units total) by mouth daily.  . Diclofenac Sodium 1 % CREA Place 1 application onto the skin as needed.  . docusate sodium (COLACE) 100 MG capsule Take 200 mg by mouth daily as needed.    . fluticasone (FLONASE) 50 MCG/ACT nasal spray Place 2 sprays into both nostrils daily.  . fluticasone furoate-vilanterol (BREO ELLIPTA) 100-25 MCG/INH AEPB Inhale 1 puff into the lungs daily.  Marland Kitchen loratadine (CLARITIN) 10 MG tablet Take 10 mg by mouth daily. As needed  . minoxidil (ROGAINE) 2 % external solution Apply topically 2 (two) times daily.    . Saline (SIMPLY SALINE) 0.9 % AERS 1 spray by Nasal route 2 (two) times daily.    . traMADol (ULTRAM) 50 MG tablet Take 1 tablet (50 mg total) by mouth 3 (three) times daily as needed.   No facility-administered encounter medications on file as of 11/20/2017.     Activities of Daily Living In your present state of health, do you have any difficulty performing the following activities: 11/20/2017  Hearing? N  Vision? N  Difficulty concentrating or making decisions? N  Walking or climbing stairs? N  Dressing or bathing? N  Doing errands, shopping? N  Preparing Food and eating ? N  Using the Toilet? N  In the past six months, have you accidently leaked urine? Y  Do you have problems with loss of bowel control? N  Managing your Medications? N  Managing your Finances? N  Housekeeping or managing your Housekeeping? N  Some recent data might be hidden    Patient Care  Team: Gayland Curry, DO as PCP - General (Geriatric Medicine) [facility]ance, Armando Reichert, MD as Consulting Physician (Pulmonary Disease) Verl Blalock, Marijo Conception, MD (Inactive) as Consulting Physician (Cardiology) Latanya Maudlin, MD as Consulting Physician (Orthopedic Surgery) Kathrynn Ducking, MD as Consulting Physician (Neurology) [place], Jennefer Bravo, MD as Referring Physician (Dermatology) Gean Quint, MD as Consulting Physician (Allergy and Immunology) Shon Hough, MD as Consulting Physician (Ophthalmology) Paula Compton, MD as Consulting Physician (Obstetrics and Gynecology) Vicie Mutters, MD as Consulting Physician (Otolaryngology) Raeanne Gathers, AUD (Audiology) Carmel Sacramento, CCC-A (Audiology)    Assessment:   This is a routine wellness examination for [place].  Exercise Activities and Dietary recommendations Current Exercise Habits: Structured exercise class, Type of exercise: Other - see comments(water aerobics), Time (Minutes): 45, Frequency (Times/Week): 3, Weekly Exercise (Minutes/Week): 135, Intensity: Mild  Goals    . Increase water intake     Increase water intake by 1-2 glasses per day       Fall Risk Fall Risk  11/20/2017 10/10/2017 09/05/2017 04/11/2017 09/19/2016  Falls in the past year? No No No No Yes  Number falls in past yr: - - - - 2 or more  Injury with Fall? - - - - Yes  Comment - - - - bruises   Is the patient's home free of loose throw rugs in walkways, pet beds, electrical cords, etc?   yes      Grab bars in the bathroom? yes      Handrails on the stairs?   yes      Adequate lighting?   yes  Depression Screen PHQ 2/9 Scores 11/20/2017 10/10/2017 09/05/2017 04/11/2017  PHQ - 2 Score 0 0 0 0     Cognitive Function  MMSE - Mini Mental State Exam 11/20/2017 09/19/2016 09/29/2015  Orientation to time 5 4 5   Orientation to Place 5 5 5   Registration 3 3 3   Attention/ Calculation 5 5 5   Recall 1 0 3  Language- name 2  objects 2 2 2   Language- repeat 1 1 1   Language- follow 3 step command 3 3 3   Language- read & follow direction 1 1 1   Write a sentence 1 1 1   Copy design 1 1 1   Total score 28 26 30         Immunization History  Administered Date(s) Administered  . Influenza Whole 12/12/2011, 12/31/2012  . Influenza-Unspecified 12/31/2014, 01/06/2016, 01/01/2017  . Pneumococcal Conjugate-13 07/14/2014  . Pneumococcal Polysaccharide-23 03/13/2005  . Tdap 04/08/2015  . Zoster 03/13/2006  . Zoster Recombinat (Shingrix) 03/29/2017, 05/24/2017    Qualifies for Shingles Vaccine? Up to date, completed  Screening Tests Health Maintenance  Topic Date Due  . INFLUENZA VACCINE  10/11/2017  . TETANUS/TDAP  04/07/2025  . DEXA SCAN  Completed  . PNA vac Low Risk Adult  Completed    Cancer Screenings: Lung: Low Dose CT Chest recommended if Age 85-80 years, 30 pack-year currently smoking OR have quit w/in 15years. Patient does not qualify. Breast:  Up to date on Mammogram? Yes   Up to date of Bone Density/Dexa? Yes Colorectal: up to date  Additional Screenings:  Hepatitis C Screening: declined Flu vaccine due: will receive at [facility]:    I have personally reviewed and addressed the Medicare Annual Wellness questionnaire and have noted the following in the patient's chart:  A. Medical and social history B. Use of alcohol, tobacco or illicit drugs  C. Current medications and supplements D. Functional ability and status E.  Nutritional status F.  Physical activity G. Advance directives H. List of other physicians I.  Hospitalizations, surgeries, and ER visits in previous 12 months J.  [place] to include hearing, vision, cognitive, depression L. Referrals and appointments - none  In addition, I have reviewed and discussed with patient certain preventive protocols, quality metrics, and best practice recommendations. A written personalized care plan for preventive  services as well as general preventive health recommendations were provided to patient.  See attached scanned questionnaire for additional information.   Signed,   Tyson Dense, RN Nurse Health Advisor  Patient Concerns: None

## 2017-12-12 ENCOUNTER — Other Ambulatory Visit: Payer: Self-pay | Admitting: Allergy & Immunology

## 2017-12-27 ENCOUNTER — Other Ambulatory Visit: Payer: Self-pay | Admitting: Internal Medicine

## 2017-12-27 DIAGNOSIS — Z1231 Encounter for screening mammogram for malignant neoplasm of breast: Secondary | ICD-10-CM

## 2018-01-04 DIAGNOSIS — Z23 Encounter for immunization: Secondary | ICD-10-CM | POA: Diagnosis not present

## 2018-01-07 ENCOUNTER — Ambulatory Visit (INDEPENDENT_AMBULATORY_CARE_PROVIDER_SITE_OTHER): Payer: Medicare Other | Admitting: Neurology

## 2018-01-07 ENCOUNTER — Encounter: Payer: Self-pay | Admitting: Neurology

## 2018-01-07 ENCOUNTER — Other Ambulatory Visit: Payer: Self-pay

## 2018-01-07 VITALS — BP 151/85 | HR 79 | Resp 20 | Ht 60.0 in | Wt 128.0 lb

## 2018-01-07 DIAGNOSIS — G250 Essential tremor: Secondary | ICD-10-CM | POA: Diagnosis not present

## 2018-01-07 DIAGNOSIS — G5603 Carpal tunnel syndrome, bilateral upper limbs: Secondary | ICD-10-CM

## 2018-01-07 NOTE — Progress Notes (Signed)
Reason for visit: Tremor  Christina Lawson is an 84 y.o. female  History of present illness:  Christina Lawson is a 84 year old right-handed white female with a history of an essential tremor.  The patient takes alprazolam if needed for the tremor, she does not take something daily.  She does have a mild gait disorder, she does not use a cane, she has not had any falls since last seen.  She comes in today with a new issue, she claims that in June 2019 she had an event of left-sided numbness that began in her left leg and then gradually spread to include the left arm.  She had no weakness or clumsiness at that time, she reported no other symptoms of numbness on the face or vision or speech changes.  The patient was given an alprazolam that evening, she was not taken to the emergency room.  The event lasted about 20 or 30 minutes.  The patient had full resolution of her symptoms.  She did undergo a brief stroke work-up that included a carotid Doppler study that did not show high-grade carotid stenosis, CT of the brain showed extensive white matter changes, no acute changes were noted.  The patient is not on low-dose aspirin currently.  She returns to this office for an evaluation, she has not had any recurrence of her numbness symptoms, she denies any palpitations of the heart.  Past Medical History:  Diagnosis Date  . Allergic rhinitis    pollen and grass  . Asthma   . Blepharitis   . Breech delivery 1962  . Carpal tunnel syndrome, bilateral 07/03/2016  . Delivery normal 1960  . Hyperlipidemia   . Irregular heart rhythm   . Macular degeneration 2012  . Movement disorder    Tremor  . Osteoporosis   . Sensorineural hearing loss, bilateral 2007  . Shingles 2004  . Skin cancer 2005   nose  . Torn meniscus 2006   right knee  . Tremor 2006   familiar.  dx by Dr. Quillian Quince  . Unspecified essential hypertension   . Unspecified hypothyroidism   . Urinary frequency     Past Surgical  History:  Procedure Laterality Date  . ABDOMINAL HYSTERECTOMY  1977  . CATARACT EXTRACTION  1974   Left, IOL implants  . CATARACT EXTRACTION Right 1979   IOL implants  . CHOLECYSTECTOMY  1981  . COSMETIC SURGERY  1995   eye  . [place]  . DILATION AND CURETTAGE OF UTERUS  1977  . [place]  . HIATAL HERNIA REPAIR  1977  . SKIN CANCER EXCISION  2005   nose  . THYROIDECTOMY  1955  . TONSILLECTOMY  1931    Family History  Problem Relation Age of Onset  . Emphysema Father   . Allergies Father   . Asthma Father   . Brain cancer Mother   . Cancer Mother        Brain  . Hypertension Mother   . Cancer Sister        Cervical  . Endometrial cancer Sister   . Breast cancer Sister 31  . Cancer Sister        Breast, metastasized to bone  . Breast cancer Sister        Cher Nakai of age  . Breast cancer Daughter 60  . Heart disease Maternal Grandfather   . Stroke Unknown   . Breast cancer Maternal Aunt   . Breast cancer Paternal Aunt   .  Breast cancer Cousin   . Breast cancer Maternal Aunt   . Breast cancer Cousin     Social history:  reports that she has never smoked. She has never used smokeless tobacco. She reports that she drinks alcohol. She reports that she does not use drugs.    Allergies  Allergen Reactions  . Clindamycin/Lincomycin     diarrhea  . Ibuprofen   . Penicillins     Medications:  Prior to Admission medications   Medication Sig Start Date End Date Taking? Authorizing Provider  albuterol (PROAIR HFA) 108 (90 Base) MCG/ACT inhaler Inhale 2 puffs into the lungs every 4 (four) hours as needed for wheezing or shortness of breath. 04/16/15  Yes Gean Quint, MD  ALPRAZolam Duanne Moron) 0.25 MG tablet Take 1 tablet (0.25 mg total) by mouth daily as needed. 04/12/17  Yes Dennie Bible, NP  Azelastine HCl 0.15 % SOLN USE 1 SPRAY NASALLY TWICE A DAY 12/12/17  Yes Valentina Shaggy, MD  calcium carbonate (TUMS - DOSED IN MG  ELEMENTAL CALCIUM) 500 MG chewable tablet Chew 1 tablet by mouth daily.   Yes [provider]  Cholecalciferol (VITAMIN D) 2000 units CAPS Take 1 capsule (2,000 Units total) by mouth daily. 03/24/15  Yes Reed, Tiffany L, DO  Diclofenac Sodium 1 % CREA Place 1 application onto the skin as needed.   Yes [provider]  docusate sodium (COLACE) 100 MG capsule Take 200 mg by mouth daily as needed.     Yes [provider]  fluticasone furoate-vilanterol (BREO ELLIPTA) 100-25 MCG/INH AEPB Inhale 1 puff into the lungs daily. 05/10/17  Yes Valentina Shaggy, MD  loratadine (CLARITIN) 10 MG tablet Take 10 mg by mouth daily. As needed   Yes [provider]  minoxidil (ROGAINE) 2 % external solution Apply topically 2 (two) times daily.     Yes [provider]  Saline (SIMPLY SALINE) 0.9 % AERS 1 spray by Nasal route 2 (two) times daily.     Yes [provider]  traMADol (ULTRAM) 50 MG tablet Take 1 tablet (50 mg total) by mouth 3 (three) times daily as needed. 10/04/16  Yes Reed, Tiffany L, DO    ROS:  Out of a complete 14 system review of symptoms, the patient complains only of the following symptoms, and all other reviewed systems are negative.  Hearing loss Drooling Wheezing Frequency of urination Environmental allergies Eye discharge Memory loss, tremors Anxiety  Blood pressure (!) 151/85, pulse 79, resp. rate 20, height 5' (1.524 m), weight 128 lb (58.1 kg).  Physical Exam  General: The patient is alert and cooperative at the time of the examination.  Skin: No significant peripheral edema is noted.   Neurologic Exam  Mental status: The patient is alert and oriented x 3 at the time of the examination. The patient has apparent normal recent and remote memory, with an apparently normal attention span and concentration ability.   Cranial nerves: Facial symmetry is present. Speech is normal, no aphasia or dysarthria is noted.  Extraocular movements are full. Visual fields are full.  Motor: The patient has good strength in all 4 extremities.  Sensory examination: Soft touch sensation is symmetric on the face, arms, and legs.  Coordination: The patient has good finger-nose-finger and heel-to-shin bilaterally.  Tremors are noted with finger-nose-finger bilaterally.  Gait and station: The patient has a normal gait for age. Tandem gait was not tested. Romberg is negative. No drift is seen.  Reflexes: Deep  tendon reflexes are symmetric.   CT head 09/18/17:  IMPRESSION: No acute intracranial abnormality noted.  Remote tiny posterior left lenticular nucleus infarct.  Prominent chronic microvascular changes.  Global atrophy.  Left convexity tiny calcified meningioma without associated mass Effect.  * CT scan images were reviewed online. I agree with the written report.   Carotid doppler 09/20/17:  IMPRESSION: Less than 50% stenosis in the right and left internal carotid arteries.    Assessment/Plan:  1.  Essential tremor  2.  Transient left-sided numbness, possible TIA  The patient does have extensive white matter changes by CT of the head.  The patient is to go on low-dose aspirin taking 81 mg daily.  She will follow-up in 6 months.  She will contact me if she has another recurrence of her left-sided numbness or other focal symptoms.  The patient is doing relatively well with her tremor, she takes alprazolam occasionally if needed.   Jill Alexanders MD 01/07/2018 10:39 AM  [facility] [address] [place], [place] [address]  Phone [phone] Fax [phone]

## 2018-01-07 NOTE — Patient Instructions (Signed)
Start aspirin 81 mg daily

## 2018-01-24 ENCOUNTER — Ambulatory Visit (INDEPENDENT_AMBULATORY_CARE_PROVIDER_SITE_OTHER): Payer: Medicare Other | Admitting: Nurse Practitioner

## 2018-01-24 ENCOUNTER — Ambulatory Visit
Admission: RE | Admit: 2018-01-24 | Discharge: 2018-01-24 | Disposition: A | Discharge status: Home / Self Care | Payer: Medicare Other | Source: Ambulatory Visit | Attending: Nurse Practitioner | Admitting: Nurse Practitioner

## 2018-01-24 ENCOUNTER — Encounter: Payer: Self-pay | Admitting: Nurse Practitioner

## 2018-01-24 VITALS — BP 158/86 | HR 78 | Temp 97.5°F | Ht 60.0 in | Wt 127.8 lb

## 2018-01-24 DIAGNOSIS — M79641 Pain in right hand: Secondary | ICD-10-CM

## 2018-01-24 DIAGNOSIS — S8991XA Unspecified injury of right lower leg, initial encounter: Secondary | ICD-10-CM | POA: Diagnosis not present

## 2018-01-24 DIAGNOSIS — R55 Syncope and collapse: Secondary | ICD-10-CM | POA: Diagnosis not present

## 2018-01-24 DIAGNOSIS — M7989 Other specified soft tissue disorders: Secondary | ICD-10-CM | POA: Diagnosis not present

## 2018-01-24 LAB — CBC WITH DIFFERENTIAL/PLATELET
BASOS ABS: 43 {cells}/uL (ref 0–200)
Basophils Relative: 0.6 %
Eosinophils Absolute: 78 cells/uL (ref 15–500)
Eosinophils Relative: 1.1 %
HEMATOCRIT: 41.8 % (ref 35.0–45.0)
Hemoglobin: 13.9 g/dL (ref 11.7–15.5)
LYMPHS ABS: 1569 {cells}/uL (ref 850–3900)
MCH: 31.4 pg (ref 27.0–33.0)
MCHC: 33.3 g/dL (ref 32.0–36.0)
MCV: 94.6 fL (ref 80.0–100.0)
MPV: 9.9 fL (ref 7.5–12.5)
Monocytes Relative: 11 %
NEUTROS PCT: 65.2 %
Neutro Abs: 4629 cells/uL (ref 1500–7800)
Platelets: 262 10*3/uL (ref 140–400)
RBC: 4.42 10*6/uL (ref 3.80–5.10)
RDW: 12 % (ref 11.0–15.0)
Total Lymphocyte: 22.1 %
WBC: 7.1 10*3/uL (ref 3.8–10.8)
WBCMIX: 781 {cells}/uL (ref 200–950)

## 2018-01-24 LAB — COMPLETE METABOLIC PANEL WITH GFR
AG Ratio: 1.4 (calc) (ref 1.0–2.5)
ALKALINE PHOSPHATASE (APISO): 59 U/L (ref 33–130)
ALT: 19 U/L (ref 6–29)
AST: 20 U/L (ref 10–35)
Albumin: 4 g/dL (ref 3.6–5.1)
BILIRUBIN TOTAL: 0.6 mg/dL (ref 0.2–1.2)
BUN: 15 mg/dL (ref 7–25)
CHLORIDE: 104 mmol/L (ref 98–110)
CO2: 29 mmol/L (ref 20–32)
CREATININE: 0.79 mg/dL (ref 0.60–0.88)
Calcium: 9.6 mg/dL (ref 8.6–10.4)
GFR, Est African American: 75 mL/min/{1.73_m2} (ref >=60)
GFR, Est Non African American: 65 mL/min/{1.73_m2} (ref >=60)
GLUCOSE: 94 mg/dL (ref 65–99)
Globulin: 2.9 g/dL (calc) (ref 1.9–3.7)
Potassium: 4.3 mmol/L (ref 3.5–5.3)
SODIUM: 140 mmol/L (ref 135–146)
Total Protein: 6.9 g/dL (ref 6.1–8.1)

## 2018-01-24 NOTE — Progress Notes (Signed)
Careteam: Patient Care Team: Gayland Curry, DO as PCP - General (Geriatric Medicine) [facility], Armando Reichert, MD as Consulting Physician (Pulmonary Disease) Verl Blalock, Marijo Conception, MD (Inactive) as Consulting Physician (Cardiology) Latanya Maudlin, MD as Consulting Physician (Orthopedic Surgery) Kathrynn Ducking, MD as Consulting Physician (Neurology) [place], Jennefer Bravo, MD as Referring Physician (Dermatology) Gean Quint, MD as Consulting Physician (Allergy and Immunology) Shon Hough, MD as Consulting Physician (Ophthalmology) Paula Compton, MD as Consulting Physician (Obstetrics and Gynecology) Vicie Mutters, MD as Consulting Physician (Otolaryngology) Raeanne Gathers, AUD (Audiology) Carmel Sacramento, CCC-A (Audiology)  Advanced Directive information    Allergies  Allergen Reactions  . Clindamycin/Lincomycin     diarrhea  . Ibuprofen   . Penicillins     Chief Complaint  Patient presents with  . Acute Visit    Syncope episode, has bruise near left eye and right thumb bruised and hurts, BP elevated.     HPI: Patient is a 84 y.o. female seen in the office today due to syncopal episode. Reports she had passing out episodes in the past and it was never discovered why. She has not had an episode since 1999. Recently with TIA, following with Dr Jannifer Franklin who recommended starting ASA 81 mg  Last night she was putting things away in her bedroom and then the next thing she remembered she was on the floor.  Unsure how much time had passed.  Got up and went to kitchen to prepare dinner. Later that evening looked in the mirror to find that she was bleeding. Then she called the nurse last night.  She took her blood pressure which was elevated 170/90 and then advised her to follow up in the morning.  Feels better today but now her right hand hurts - did not have issues with this yesterday.  Also feeling light headed today however has only eaten a  muffin today and not drank much  Really the right hand in the main thing bothering her.  7/10 aching pain when touched or using.  Reports just aches (not bad) when not touching or using. bruising and swelling noted to thumb of right hand.   Does not take medication for blood pressure- reports blood pressure will go up and down, reports it is generally around 140/80, recently has been eating more pretzels with salt.   Review of Systems:  Review of Systems  Constitutional: Negative for chills and fever.  HENT: Positive for hearing loss. Negative for congestion.   Eyes: Positive for blurred vision.  Respiratory: Negative for cough and shortness of breath.   Cardiovascular: Negative for chest pain, palpitations and leg swelling.  Gastrointestinal: Negative for abdominal pain, blood in stool, constipation, diarrhea and melena.  Genitourinary: Negative for dysuria.  Musculoskeletal: Positive for joint pain. Negative for falls.  Skin: Negative for itching and rash.  Neurological: Positive for tremors. Negative for dizziness, loss of consciousness and headaches.  Psychiatric/Behavioral: Positive for memory loss. Negative for depression. The patient is nervous/anxious. The patient does not have insomnia.        Mild memory loss with age-related changes    Past Medical History:  Diagnosis Date  . Allergic rhinitis    pollen and grass  . Asthma   . Blepharitis   . Breech delivery 1962  . Carpal tunnel syndrome, bilateral 07/03/2016  . Delivery normal 1960  . Hyperlipidemia   . Irregular heart rhythm   . Macular degeneration 2012  . Movement disorder  Tremor  . Osteoporosis   . Sensorineural hearing loss, bilateral 2007  . Shingles 2004  . Skin cancer 2005   nose  . Torn meniscus 2006   right knee  . Tremor 2006   familiar.  dx by Dr. Quillian Quince  . Unspecified essential hypertension   . Unspecified hypothyroidism   . Urinary frequency    Past Surgical History:  Procedure  Laterality Date  . ABDOMINAL HYSTERECTOMY  1977  . CATARACT EXTRACTION  1974   Left, IOL implants  . CATARACT EXTRACTION Right 1979   IOL implants  . CHOLECYSTECTOMY  1981  . COSMETIC SURGERY  1995   eye  . [place]  . DILATION AND CURETTAGE OF UTERUS  1977  . [place]  . HIATAL HERNIA REPAIR  1977  . SKIN CANCER EXCISION  2005   nose  . THYROIDECTOMY  1955  . TONSILLECTOMY  1931   Social History:   reports that she has never smoked. She has never used smokeless tobacco. She reports that she drinks alcohol. She reports that she does not use drugs.  Family History  Problem Relation Age of Onset  . Emphysema Father   . Allergies Father   . Asthma Father   . Brain cancer Mother   . Cancer Mother        Brain  . Hypertension Mother   . Cancer Sister        Cervical  . Endometrial cancer Sister   . Breast cancer Sister 71  . Cancer Sister        Breast, metastasized to bone  . Breast cancer Sister        Cher Nakai of age  . Breast cancer Daughter 21  . Heart disease Maternal Grandfather   . Stroke Unknown   . Breast cancer Maternal Aunt   . Breast cancer Paternal Aunt   . Breast cancer Cousin   . Breast cancer Maternal Aunt   . Breast cancer Cousin     Medications: Patient's Medications  New Prescriptions   No medications on file  Previous Medications   ALBUTEROL (PROAIR HFA) 108 (90 BASE) MCG/ACT INHALER    Inhale 2 puffs into the lungs every 4 (four) hours as needed for wheezing or shortness of breath.   ALPRAZOLAM (XANAX) 0.25 MG TABLET    Take 1 tablet (0.25 mg total) by mouth daily as needed.   AZELASTINE HCL 0.15 % SOLN    USE 1 SPRAY NASALLY TWICE A DAY   CALCIUM CARBONATE (TUMS - DOSED IN MG ELEMENTAL CALCIUM) 500 MG CHEWABLE TABLET    Chew 1 tablet by mouth daily.   CHOLECALCIFEROL (VITAMIN D) 2000 UNITS CAPS    Take 1 capsule (2,000 Units total) by mouth daily.   DICLOFENAC SODIUM 1 % CREA    Place 1 application onto the skin  as needed.   DOCUSATE SODIUM (COLACE) 100 MG CAPSULE    Take 200 mg by mouth daily as needed.     FLUTICASONE FUROATE-VILANTEROL (BREO ELLIPTA) 100-25 MCG/INH AEPB    Inhale 1 puff into the lungs daily.   LORATADINE (CLARITIN) 10 MG TABLET    Take 10 mg by mouth daily. As needed   MINOXIDIL (ROGAINE) 2 % EXTERNAL SOLUTION    Apply topically 2 (two) times daily.     SALINE (SIMPLY SALINE) 0.9 % AERS    1 spray by Nasal route 2 (two) times daily.     TRAMADOL (ULTRAM) 50 MG TABLET  Take 1 tablet (50 mg total) by mouth 3 (three) times daily as needed.  Modified Medications   No medications on file  Discontinued Medications   No medications on file     Physical Exam:  Vitals:   01/24/18 1249  BP: (!) 158/86  Pulse: 78  Temp: (!) 97.5 F (36.4 C)  TempSrc: Oral  SpO2: 97%  Weight: 127 lb 12.8 oz (58 kg)  Height: 5' (1.524 m)   Body mass index is 24.96 kg/m.  Physical Exam  Constitutional: She is oriented to person, place, and time. She appears well-developed and well-nourished. No distress.  HENT:  Head: Normocephalic.    Small abrasion noted over left eyebrown, slight ecchymosis noted.   Eyes:  glasses  Cardiovascular: Normal rate, regular rhythm and intact distal pulses.  Murmur heard. Pulmonary/Chest: Effort normal and breath sounds normal. No respiratory distress.  Abdominal: Bowel sounds are normal.  Musculoskeletal: Normal range of motion.       Right wrist: She exhibits tenderness (with swlling noted to base of right thumb).  Neurological: She is alert and oriented to person, place, and time. She displays normal reflexes. No cranial nerve deficit. Coordination normal.  Resting tremor  Skin: Skin is warm and dry. Capillary refill takes less than 2 seconds.  Psychiatric: She has a normal mood and affect.    Labs reviewed: Basic Metabolic Panel: Recent Labs    10/02/17 0600  NA 143  K 4.7  BUN 12  CREATININE 0.6   Liver Function Tests: Recent Labs     10/02/17 0600  AST 23  ALT 18  ALKPHOS 66   No results for input(s): LIPASE, AMYLASE in the last 8760 hours. No results for input(s): AMMONIA in the last 8760 hours. CBC: Recent Labs    10/02/17 0600  WBC 5.6  HGB 14.1  HCT 42  PLT 222   Lipid Panel: Recent Labs    10/02/17 0600  CHOL 230*  HDL 63  LDLCALC 144  TRIG 116   TSH: No results for input(s): TSH in the last 8760 hours. A1C: Lab Results  Component Value Date   HGBA1C 5.9 10/02/2017     Assessment/Plan 1. Syncope, unspecified syncope type - EKG 12-Lead- SR, rate 69, changes noted however consistent with previous EKG due to this with syncope will refer to cardiology for further evaluation and possible event monitor.  - Ambulatory referral to Cardiology - CBC with Differential/Platelets - COMPLETE METABOLIC PANEL WITH GFR  2. Right hand pain -to use ice and elevate at this time.  - DG Hand Complete Right; Future  Next appt: 04/17/2018, sooner if needed  Isola Mehlman K. [place], [facility] [phone]

## 2018-01-24 NOTE — Patient Instructions (Signed)
To use ice ~20 mins three time daily to hand Can use gel after ice May use tylenol or tramadol for pain  Referral placed for cardiology.

## 2018-01-27 ENCOUNTER — Encounter: Payer: Self-pay | Admitting: Nurse Practitioner

## 2018-01-29 DIAGNOSIS — L218 Other seborrheic dermatitis: Secondary | ICD-10-CM | POA: Diagnosis not present

## 2018-01-29 DIAGNOSIS — L814 Other melanin hyperpigmentation: Secondary | ICD-10-CM | POA: Diagnosis not present

## 2018-01-29 DIAGNOSIS — L853 Xerosis cutis: Secondary | ICD-10-CM | POA: Diagnosis not present

## 2018-01-29 DIAGNOSIS — L821 Other seborrheic keratosis: Secondary | ICD-10-CM | POA: Diagnosis not present

## 2018-01-29 DIAGNOSIS — L570 Actinic keratosis: Secondary | ICD-10-CM | POA: Diagnosis not present

## 2018-01-29 DIAGNOSIS — Z85828 Personal history of other malignant neoplasm of skin: Secondary | ICD-10-CM | POA: Diagnosis not present

## 2018-02-11 ENCOUNTER — Ambulatory Visit: Payer: Self-pay

## 2018-03-11 ENCOUNTER — Telehealth: Payer: Self-pay | Admitting: Neurology

## 2018-03-11 MED ORDER — ALPRAZOLAM 0.25 MG PO TABS: 0.2500 mg | ORAL_TABLET | Freq: Every day | ORAL | 1 refills | Status: DC | PRN

## 2018-03-11 NOTE — Addendum Note (Signed)
Addended by: Kathrynn Ducking on: 03/11/2018 03:51 PM   Modules accepted: Orders

## 2018-03-11 NOTE — Telephone Encounter (Signed)
Refill request for Xanax 0.25 mg.   Drug registry verified. Last rx was written on 04/15/2017 #90 for a day supply provided by our office.  Last office visit was 01/07/2018 and next f/u is scheduled for 07/31/18.  MB RN

## 2018-03-11 NOTE — Telephone Encounter (Signed)
Pt requesting refills for ALPRAZolam (XANAX) 0.25 MG tablet sent [facility]

## 2018-03-11 NOTE — Telephone Encounter (Signed)
A prescription for alprazolam was given.

## 2018-03-22 ENCOUNTER — Ambulatory Visit
Admission: RE | Admit: 2018-03-22 | Discharge: 2018-03-22 | Disposition: A | Discharge status: Home / Self Care | Payer: Medicare Other | Source: Ambulatory Visit | Attending: Internal Medicine | Admitting: Internal Medicine

## 2018-03-22 DIAGNOSIS — Z1231 Encounter for screening mammogram for malignant neoplasm of breast: Secondary | ICD-10-CM | POA: Diagnosis not present

## 2018-03-29 ENCOUNTER — Encounter

## 2018-03-29 ENCOUNTER — Encounter: Payer: Self-pay | Admitting: Internal Medicine

## 2018-03-29 ENCOUNTER — Ambulatory Visit (INDEPENDENT_AMBULATORY_CARE_PROVIDER_SITE_OTHER): Payer: Medicare Other | Admitting: Internal Medicine

## 2018-03-29 VITALS — BP 154/70 | HR 77 | Ht 60.0 in | Wt 131.0 lb

## 2018-03-29 DIAGNOSIS — R55 Syncope and collapse: Secondary | ICD-10-CM | POA: Diagnosis not present

## 2018-03-29 NOTE — Progress Notes (Signed)
Cardiology Office Note:    Date:  03/29/2018   ID:  Christina Lawson, DOB 1923-10-05, MRN [id number]  PCP:  Gayland Curry, DO  Cardiologist:  No primary care provider on file.  Electrophysiologist:  None   Referring MD: Lauree Chandler, NP   Syncopal episode  History of Present Illness:    Christina Lawson is a 85 y.o. female with a hx of fainting and a history of high blood pressure in the past for which she used to take medication who presents today for evaluation of a syncopal episode.  She has expressed to me that if testing is not necessary she would not like any further testing unless absolutely necessary.  She tells me that she was standing in her bedroom and She knew she was on the floor.  She does not recall a prodrome nor does she recall preceding chest discomfort, shortness of breath, palpitations.  She does recall a history of fainting when she was married with children, she had a stress test which she does not recall hearing anything abnormal about, and no further work-up was pursued per her recollection.  No significant cause for fainting was determined.  She also tells me that as a child she was felt to have a weak heart, however I would note that this was in the 1920s.  She denies chest discomfort, shortness of breath, palpitations, PND, orthopnea.  She does endorse occasional leg swelling.  When she gets leg swelling she cuts back on eating potato chips since these have a lot of salt.  She used to wear compression stockings many years ago, and has not been doing so in the recent past.  She endorses occasional dizziness, and she has been told to put her head between her knees and rest if she gets this sensation.  She does not stay adequately hydrated during the day per her report, and probably drinks less than 8 ounces of water per day.  She does drink quite a bit of milk.  She had an echocardiogram performed in 2012 at the time of a cardiology work-up, at that  time she was found to have possible "very small LVOT obstruction", and mild LVH and aortic valve sclerosis.  Past Medical History:  Diagnosis Date  . Allergic rhinitis    pollen and grass  . Asthma   . Blepharitis   . Breech delivery 1962  . Carpal tunnel syndrome, bilateral 07/03/2016  . Delivery normal 1960  . Hyperlipidemia   . Irregular heart rhythm   . Macular degeneration 2012  . Movement disorder    Tremor  . Osteoporosis   . Sensorineural hearing loss, bilateral 2007  . Shingles 2004  . Skin cancer 2005   nose  . Torn meniscus 2006   right knee  . Tremor 2006   familiar.  dx by Dr. Quillian Quince  . Unspecified essential hypertension   . Unspecified hypothyroidism   . Urinary frequency     Past Surgical History:  Procedure Laterality Date  . ABDOMINAL HYSTERECTOMY  1977  . CATARACT EXTRACTION  1974   Left, IOL implants  . CATARACT EXTRACTION Right 1979   IOL implants  . CHOLECYSTECTOMY  1981  . COSMETIC SURGERY  1995   eye  . [place]  . DILATION AND CURETTAGE OF UTERUS  1977  . [place]  . HIATAL HERNIA REPAIR  1977  . SKIN CANCER EXCISION  2005   nose  . THYROIDECTOMY  1955  .  TONSILLECTOMY  1931    Current Medications: Current Meds  Medication Sig  . albuterol (PROVENTIL HFA;VENTOLIN HFA) 108 (90 Base) MCG/ACT inhaler Inhale 2 puffs into the lungs as directed.  Marland Kitchen ALPRAZolam (XANAX) 0.25 MG tablet Take 1 tablet (0.25 mg total) by mouth daily as needed.  . Azelastine HCl 0.15 % SOLN USE 1 SPRAY NASALLY TWICE A DAY  . calcium carbonate (TUMS - DOSED IN MG ELEMENTAL CALCIUM) 500 MG chewable tablet Chew 1 tablet by mouth daily.  . Cholecalciferol (VITAMIN D) 2000 units CAPS Take 1 capsule (2,000 Units total) by mouth daily.  . Diclofenac Sodium 1 % CREA Place 1 application onto the skin as needed.  . docusate sodium (COLACE) 100 MG capsule Take 200 mg by mouth daily as needed.    . fluticasone furoate-vilanterol (BREO  ELLIPTA) 100-25 MCG/INH AEPB Inhale 1 puff into the lungs daily.  Marland Kitchen loratadine (CLARITIN) 10 MG tablet Take 10 mg by mouth daily. As needed  . minoxidil (ROGAINE) 2 % external solution Apply topically 2 (two) times daily.    . Saline (SIMPLY SALINE) 0.9 % AERS 1 spray by Nasal route 2 (two) times daily.    . traMADol (ULTRAM) 50 MG tablet Take 1 tablet (50 mg total) by mouth 3 (three) times daily as needed.     Allergies:   Clindamycin/lincomycin; Ibuprofen; and Penicillins   Social History   Socioeconomic History  . Marital status: Single    Spouse name: Not on file  . Number of children: 2  . Years of education: college  . Highest education level: Not on file  Occupational History  . Occupation: retired Education officer, museum    Comment: retired  Scientific laboratory technician  . Financial resource strain: Not hard at all  . Food insecurity:    Worry: Never true    Inability: Never true  . Transportation needs:    Medical: No    Non-medical: No  Tobacco Use  . Smoking status: Never Smoker  . Smokeless tobacco: Never Used  Substance and Sexual Activity  . Alcohol use: Yes    Comment: on occ.  . Drug use: No  . Sexual activity: Never  Lifestyle  . Physical activity:    Days per week: 3 days    Minutes per session: 40 min  . Stress: Only a little  Relationships  . Social connections:    Talks on phone: More than three times a week    Gets together: More than three times a week    Attends religious service: Never    Active member of club or organization: No    Attends meetings of clubs or organizations: Never    Relationship status: Widowed  Other Topics Concern  . Not on file  Social History Narrative   Lives at [place]. Married 1958.   Never smoked   Alcohol minimal   Exercise predominantly walking, water aerobic   Retired Education officer, museum        Family History: The patient's family history includes Allergies in her father; Asthma in her father; Brain cancer in her  mother; Breast cancer in her cousin, cousin, maternal aunt, maternal aunt, paternal aunt, and sister; Breast cancer (age of onset: 77) in her sister; Breast cancer (age of onset: 43) in her daughter; Cancer in her mother, sister, and sister; Emphysema in her father; Endometrial cancer in her sister; Heart disease in her maternal grandfather; Hypertension in her mother; Stroke in an other family member.  ROS:  Please see the history of present illness.    All other systems reviewed and are negative.  EKGs/Labs/Other Studies Reviewed:    The following studies were reviewed today:  EKG: Normal sinus rhythm, left axis deviation, inferior Q waves.  Ventricular rate 77 bpm  Recent Labs: 01/24/2018: ALT 19; BUN 15; Creat 0.79; Hemoglobin 13.9; Platelets 262; Potassium 4.3; Sodium 140  Recent Lipid Panel    Component Value Date/Time   CHOL 230 (A) 10/02/2017 0600   TRIG 116 10/02/2017 0600   HDL 63 10/02/2017 0600   LDLCALC 144 10/02/2017 0600    Physical Exam:    VS:  BP (!) 154/70 (BP Location: Right Arm)   Pulse 77   Ht 5' (1.524 m)   Wt 131 lb (59.4 kg)   BMI 25.58 kg/m     Wt Readings from Last 3 Encounters:  03/29/18 131 lb (59.4 kg)  01/24/18 127 lb 12.8 oz (58 kg)  01/07/18 128 lb (58.1 kg)     Constitutional: No acute distress Eyes: pupils equally round and reactive to light, sclera non-icteric, normal conjunctiva and lids ENMT: normal dentition, moist mucous membranes Cardiovascular: regular rhythm, normal rate, 2 out of 6 systolic ejection murmur heard across the precordium.  Preserved S2.  Dynamic maneuvers performed, murmur does not increase with Valsalva. S1 and S2 normal. Radial pulses normal bilaterally. No jugular venous distention.  Respiratory: clear to auscultation bilaterally GI : normal bowel sounds, soft and nontender. No distention.   MSK: extremities warm, well perfused. No edema.  NEURO: grossly nonfocal exam, moves all extremities. PSYCH: alert and  oriented x 3, normal mood and affect.   ASSESSMENT:    1. Syncope, unspecified syncope type    PLAN:   Ms. Shor and I have had a thorough discussion of testing and treatment for episodes of syncope.  We have participated in shared decision making.  I have recommended an echocardiogram to evaluate for LVOT obstruction as a source of her syncope.  She is reluctant to have any additional testing unless absolutely necessary.  We discussed that she may have LVOT obstruction which may be a source of syncope, and the treatment would likely be adequate hydration and beta-blockade.  She has episodes of dizziness and says her blood pressure fluctuates.  She has to stand for a minute or 2 before she walks.  My concern with initiating beta-blockade is that she may have worsening of her dizziness.  However she may have improvement in symptoms with adequate hydration.  We can consider a low-dose of the beta-blockade in the future and this is what the patient would prefer.  I have asked her to call the office and provided her the office number if she would like to pursue an echocardiogram to evaluate for LVOT obstruction.  She would not like any procedures to treat this condition.  Her murmur does not sound as if she has severe aortic stenosis, and my impression from the patient is that she would not be interested in TAVR.  However this can be discussed at future visits if the severity of her aortic stenosis is in question.  She has declined event monitor today but knows that this is an option for her in the future.  Medication Adjustments/Labs and Tests Ordered: Current medicines are reviewed at length with the patient today.  Concerns regarding medicines are outlined above.  Orders Placed This Encounter  Procedures  . EKG 12-Lead   No orders of the defined types were placed in this  encounter.   Patient Instructions  Medication Instructions:  Your physician recommends that you continue on your current  medications as directed. Please refer to the Current Medication list given to you today.  If you need a refill on your cardiac medications before your next appointment, please call your pharmacy.   Lab work: None ordered If you have labs (blood work) drawn today and your tests are completely normal, you will receive your results only by: Marland Kitchen MyChart Message (if you have MyChart) OR . A paper copy in the mail If you have any lab test that is abnormal or we need to change your treatment, we will call you to review the results.  Testing/Procedures: None ordered  Follow-Up: At [facility], you and your health needs are our priority.  As part of our continuing mission to provide you with exceptional heart care, we have created designated Provider Care Teams.  These Care Teams include your primary Cardiologist (physician) and Advanced Practice Providers (APPs -  Physician Assistants and Nurse Practitioners) who all work together to provide you with the care you need, when you need it. You will need a follow up appointment  as needed   Any Other Special Instructions Will Be Listed Below (If Applicable). -Please stay hydrated -Wear compression stockings daily -Call us in 1 month to give an update -Call us if you decide to have the echocardiogram      Signed, Elouise Munroe, MD  03/29/2018 4:35 PM    [place]

## 2018-03-29 NOTE — Patient Instructions (Signed)
Medication Instructions:  Your physician recommends that you continue on your current medications as directed. Please refer to the Current Medication list given to you today.  If you need a refill on your cardiac medications before your next appointment, please call your pharmacy.   Lab work: None ordered If you have labs (blood work) drawn today and your tests are completely normal, you will receive your results only by: Marland Kitchen MyChart Message (if you have MyChart) OR . A paper copy in the mail If you have any lab test that is abnormal or we need to change your treatment, we will call you to review the results.  Testing/Procedures: None ordered  Follow-Up: At [facility], you and your health needs are our priority.  As part of our continuing mission to provide you with exceptional heart care, we have created designated Provider Care Teams.  These Care Teams include your primary Cardiologist (physician) and Advanced Practice Providers (APPs -  Physician Assistants and Nurse Practitioners) who all work together to provide you with the care you need, when you need it. You will need a follow up appointment  as needed   Any Other Special Instructions Will Be Listed Below (If Applicable). -Please stay hydrated -Wear compression stockings daily -Call us in 1 month to give an update -Call us if you decide to have the echocardiogram

## 2018-04-09 DIAGNOSIS — E785 Hyperlipidemia, unspecified: Secondary | ICD-10-CM | POA: Diagnosis not present

## 2018-04-09 DIAGNOSIS — D649 Anemia, unspecified: Secondary | ICD-10-CM | POA: Diagnosis not present

## 2018-04-09 DIAGNOSIS — I10 Essential (primary) hypertension: Secondary | ICD-10-CM | POA: Diagnosis not present

## 2018-04-09 LAB — BASIC METABOLIC PANEL
BUN: 14 (ref 4–21)
Creatinine: 0.7 (ref 0.5–1.1)
Glucose: 96
Potassium: 5.2 (ref 3.4–5.3)
Sodium: 143 (ref 137–147)

## 2018-04-09 LAB — LIPID PANEL
Cholesterol: 220 — AB (ref 0–200)
HDL: 64 (ref 35–70)
LDL Cholesterol: 137
Triglycerides: 96 (ref 40–160)

## 2018-04-12 ENCOUNTER — Encounter: Payer: Self-pay | Admitting: Internal Medicine

## 2018-04-13 ENCOUNTER — Emergency Department (HOSPITAL_BASED_OUTPATIENT_CLINIC_OR_DEPARTMENT_OTHER)
Admission: EM | Admit: 2018-04-13 | Discharge: 2018-04-13 | Disposition: A | Discharge status: Home / Self Care | Payer: Medicare Other | Attending: Emergency Medicine | Admitting: Emergency Medicine

## 2018-04-13 ENCOUNTER — Encounter (HOSPITAL_BASED_OUTPATIENT_CLINIC_OR_DEPARTMENT_OTHER): Payer: Self-pay

## 2018-04-13 ENCOUNTER — Emergency Department (HOSPITAL_BASED_OUTPATIENT_CLINIC_OR_DEPARTMENT_OTHER): Payer: Medicare Other

## 2018-04-13 ENCOUNTER — Other Ambulatory Visit: Payer: Self-pay

## 2018-04-13 DIAGNOSIS — J45909 Unspecified asthma, uncomplicated: Secondary | ICD-10-CM | POA: Insufficient documentation

## 2018-04-13 DIAGNOSIS — Z85828 Personal history of other malignant neoplasm of skin: Secondary | ICD-10-CM | POA: Diagnosis not present

## 2018-04-13 DIAGNOSIS — Z79899 Other long term (current) drug therapy: Secondary | ICD-10-CM | POA: Diagnosis not present

## 2018-04-13 DIAGNOSIS — R109 Unspecified abdominal pain: Secondary | ICD-10-CM | POA: Diagnosis not present

## 2018-04-13 DIAGNOSIS — E039 Hypothyroidism, unspecified: Secondary | ICD-10-CM | POA: Insufficient documentation

## 2018-04-13 DIAGNOSIS — N281 Cyst of kidney, acquired: Secondary | ICD-10-CM | POA: Diagnosis not present

## 2018-04-13 DIAGNOSIS — Z8673 Personal history of transient ischemic attack (TIA), and cerebral infarction without residual deficits: Secondary | ICD-10-CM | POA: Diagnosis not present

## 2018-04-13 DIAGNOSIS — K5730 Diverticulosis of large intestine without perforation or abscess without bleeding: Secondary | ICD-10-CM | POA: Diagnosis not present

## 2018-04-13 DIAGNOSIS — K449 Diaphragmatic hernia without obstruction or gangrene: Secondary | ICD-10-CM | POA: Diagnosis not present

## 2018-04-13 LAB — URINALYSIS, ROUTINE W REFLEX MICROSCOPIC
Bilirubin Urine: NEGATIVE
Glucose, UA: NEGATIVE mg/dL
Hgb urine dipstick: NEGATIVE
Ketones, ur: NEGATIVE mg/dL
LEUKOCYTES UA: NEGATIVE
Nitrite: NEGATIVE
Protein, ur: NEGATIVE mg/dL
pH: 7 (ref 5.0–8.0)

## 2018-04-13 MED ORDER — HYDROCODONE-ACETAMINOPHEN 5-325 MG PO TABS
1.0000 | ORAL_TABLET | Freq: Once | ORAL | Status: AC
  Administered 2018-04-13: 1 via ORAL
  Filled 2018-04-13: qty 1

## 2018-04-13 NOTE — ED Notes (Addendum)
Pt c/o left side back pain x 2 days. Pt denies injury, states nothing different in daily activities. Pt states she took tramadol-50mg  at 2pm today and xanax, .75 mg at 0900 today with mild relief. Pt denies fever, denies dysuria

## 2018-04-13 NOTE — ED Notes (Signed)
Patient transported to CT 

## 2018-04-13 NOTE — ED Triage Notes (Signed)
Pt c/o L sided low back pain that is described as intermittent spasms and is worse with movement. Pt reports urinary frequency.

## 2018-04-13 NOTE — ED Provider Notes (Signed)
[facility] Provider Note   CSN: [id number] Arrival date & time: 04/13/18  1535     History   Chief Complaint Chief Complaint  Patient presents with  . Back Pain    HPI Christina Lawson is a 85 y.o. female.  Patient with history of asthma, allergic rhinitis presents with left flank pain for almost 2 days.  No injury or twisting recalled.  Nothing changed in her routine.  Patient took tramadol and Tylenol with minimal relief.  Patient denies fever or urinary symptoms.  No history of similar.  No history of kidney stone.  Patient is healthy otherwise with no significant surgical history had a C-section decades ago.  Patient denies weakness or numbness in her legs.  No chest pain or new shortness of breath.     Past Medical History:  Diagnosis Date  . Allergic rhinitis    pollen and grass  . Asthma   . Blepharitis   . Breech delivery 1962  . Carpal tunnel syndrome, bilateral 07/03/2016  . Delivery normal 1960  . Hyperlipidemia   . Irregular heart rhythm   . Macular degeneration 2012  . Movement disorder    Tremor  . Osteoporosis   . Sensorineural hearing loss, bilateral 2007  . Shingles 2004  . Skin cancer 2005   nose  . Torn meniscus 2006   right knee  . Tremor 2006   familiar.  dx by Dr. Quillian Quince  . Unspecified essential hypertension   . Unspecified hypothyroidism   . Urinary frequency     Patient Active Problem List   Diagnosis Date Noted  . Left thigh pain 10/10/2017  . Anxiety 10/10/2017  . H/O TIA (transient ischemic attack) and stroke 10/10/2017  . Moderate persistent asthma without complication 33/29/5188  . Parkinsonism ([place]) 04/11/2017  . Mild persistent asthma without complication 41/66/0630  . Seasonal allergic rhinitis 04/05/2017  . Carpal tunnel syndrome, bilateral 07/03/2016  . Numbness and tingling in both hands 06/05/2016  . Drooling 04/12/2016  . Hyperglycemia 03/24/2015  . Senile osteopenia 03/24/2015    . 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 Murmur, cardiac 03/24/2015  . Conjunctivitis 01/12/2014  . Edema 08/20/2013  . Senile ecchymosis 06/23/2013  . Rash and nonspecific skin eruption 03/26/2013  . Pain in left knee 01/06/2013  . Osteopenia 12/16/2012  . Urinary frequency 09/23/2012  . Impacted cerumen 09/23/2012  . Essential hypertension   . Hyperlipidemia   . Tremor, essential   . Cardiomegaly - hypertensive 08/10/2010  . Asthma, intrinsic 05/31/2010  . Dyspnea 05/31/2010    Past Surgical History:  Procedure Laterality Date  . ABDOMINAL HYSTERECTOMY  1977  . CATARACT EXTRACTION  1974   Left, IOL implants  . CATARACT EXTRACTION Right 1979   IOL implants  . CHOLECYSTECTOMY  1981  . COSMETIC SURGERY  1995   eye  . [place]  . DILATION AND CURETTAGE OF UTERUS  1977  . [place]  . HIATAL HERNIA REPAIR  1977  . SKIN CANCER EXCISION  2005   nose  . THYROIDECTOMY  1955  . TONSILLECTOMY  1931     OB History   No obstetric history on file.      Home Medications    Prior to Admission medications   Medication Sig Start Date End Date Taking? Authorizing Provider  albuterol (PROVENTIL HFA;VENTOLIN HFA) 108 (90 Base) MCG/ACT inhaler Inhale 2 puffs into the lungs as directed.    [provider]  ALPRAZolam (  XANAX) 0.25 MG tablet Take 1 tablet (0.25 mg total) by mouth daily as needed. 03/11/18   Kathrynn Ducking, MD  Azelastine HCl 0.15 % SOLN USE 1 SPRAY NASALLY TWICE A DAY 12/12/17   Valentina Shaggy, MD  calcium carbonate (TUMS - DOSED IN MG ELEMENTAL CALCIUM) 500 MG chewable tablet Chew 1 tablet by mouth daily.    [provider]  Cholecalciferol (VITAMIN D) 2000 units CAPS Take 1 capsule (2,000 Units total) by mouth daily. 03/24/15   Reed, Tiffany L, DO  Diclofenac Sodium 1 % CREA Place 1 application onto the skin as needed.    [provider]  docusate sodium (COLACE) 100 MG capsule Take 200 mg by mouth  daily as needed.      [provider]  fluticasone furoate-vilanterol (BREO ELLIPTA) 100-25 MCG/INH AEPB Inhale 1 puff into the lungs daily. 05/10/17   Valentina Shaggy, MD  loratadine (CLARITIN) 10 MG tablet Take 10 mg by mouth daily. As needed    [provider]  minoxidil (ROGAINE) 2 % external solution Apply topically 2 (two) times daily.      [provider]  Saline (SIMPLY SALINE) 0.9 % AERS 1 spray by Nasal route 2 (two) times daily.      [provider]  traMADol (ULTRAM) 50 MG tablet Take 1 tablet (50 mg total) by mouth 3 (three) times daily as needed. 10/04/16   Gayland Curry, DO    Family History Family History  Problem Relation Age of Onset  . Emphysema Father   . Allergies Father   . Asthma Father   . Brain cancer Mother   . Cancer Mother        Brain  . Hypertension Mother   . Cancer Sister        Cervical  . Endometrial cancer Sister   . Breast cancer Sister 24  . Cancer Sister        Breast, metastasized to bone  . Breast cancer Sister        Cher Nakai of age  . Breast cancer Daughter 2  . Heart disease Maternal Grandfather   . Stroke Other   . Breast cancer Maternal Aunt   . Breast cancer Paternal Aunt   . Breast cancer Cousin   . Breast cancer Maternal Aunt   . Breast cancer Cousin     Social History Social History   Tobacco Use  . Smoking status: Never Smoker  . Smokeless tobacco: Never Used  Substance Use Topics  . Alcohol use: Yes    Comment: on occ.  . Drug use: No     Allergies   Clindamycin/lincomycin and Penicillins   Review of Systems Review of Systems  Constitutional: Negative for chills and fever.  HENT: Negative for congestion.   Eyes: Negative for visual disturbance.  Respiratory: Negative for shortness of breath.   Cardiovascular: Negative for chest pain.  Gastrointestinal: Negative for abdominal pain and vomiting.  Genitourinary: Positive for flank pain. Negative for dysuria.    Musculoskeletal: Positive for back pain. Negative for neck pain and neck stiffness.  Skin: Negative for rash.  Neurological: Negative for light-headedness and headaches.     Physical Exam Updated Vital Signs BP (!) 197/90 (BP Location: Right Arm)   Pulse 80   Temp 98.5 F (36.9 C) (Oral)   Resp 16   Ht 5' (1.524 m)   Wt 59 kg   SpO2 98%   BMI 25.39 kg/m   Physical Exam Vitals  signs and nursing note reviewed.  Constitutional:      Appearance: She is well-developed.  HENT:     Head: Normocephalic and atraumatic.  Eyes:     General:        Right eye: No discharge.        Left eye: No discharge.     Conjunctiva/sclera: Conjunctivae normal.  Neck:     Musculoskeletal: Normal range of motion.     Trachea: No tracheal deviation.  Cardiovascular:     Rate and Rhythm: Normal rate.  Pulmonary:     Effort: Pulmonary effort is normal.  Abdominal:     General: There is no distension.     Palpations: Abdomen is soft.     Tenderness: There is no abdominal tenderness. There is no guarding.  Musculoskeletal:     Comments: Patient has normal gait and normal strength with ambulating bilateral lower extremities.  Patient has no reproducible tenderness in the left flank region where she has pain.  Skin:    General: Skin is warm.     Findings: No rash.  Neurological:     Mental Status: She is alert and oriented to person, place, and time.      ED Treatments / Results  Labs (all labs ordered are listed, but only abnormal results are displayed) Labs Reviewed  URINALYSIS, ROUTINE W REFLEX MICROSCOPIC - Abnormal; Notable for the following components:      Result Value   Specific Gravity, Urine <1.005 (*)    All other components within normal limits    EKG None  Radiology Ct Renal Stone Study  Result Date: 04/13/2018 CLINICAL DATA:  Left flank pain today. EXAM: CT ABDOMEN AND PELVIS WITHOUT CONTRAST TECHNIQUE: Multidetector CT imaging of the abdomen and pelvis was performed  following the standard protocol without IV contrast. COMPARISON:  None. FINDINGS: Lower chest: Heart size is mildly enlarged. Calcific coronary artery disease noted. No pleural or pericardial effusion. Lung bases clear. Hepatobiliary: No focal liver abnormality is seen. Status post cholecystectomy. No biliary dilatation. Pancreas: Unremarkable. No pancreatic ductal dilatation or surrounding inflammatory changes. Spleen: Normal in size without focal abnormality. Adrenals/Urinary Tract: Bilateral renal cysts are identified. The largest cyst is off the upper pole of the left kidney and measures approximately 7.5 cm in diameter. There are also some parapelvic left renal cysts. No renal or ureteral stones. No hydronephrosis. Urinary bladder is unremarkable. Stomach/Bowel: Diverticulosis without diverticulitis is most notable in the sigmoid. The colon is otherwise unremarkable. Small bowel and appendix appear normal. Vascular/Lymphatic: Aortic atherosclerosis. No enlarged abdominal or pelvic lymph nodes. Reproductive: Status post hysterectomy. No adnexal masses. Other: None. Musculoskeletal: No acute or focal bony abnormality. Degenerative disease about the hips and lumbar spondylosis noted. IMPRESSION: No acute abnormality abdomen or pelvis. Negative for urinary tract stone. Mild cardiomegaly. Moderate hiatal hernia. Calcific aortic and coronary atherosclerosis. Diverticulosis without diverticulitis. Electronically Signed   By: Inge Rise M.D.   On: 04/13/2018 16:50    Procedures Procedures (including critical care time)  Medications Ordered in ED Medications  HYDROcodone-acetaminophen (NORCO/VICODIN) 5-325 MG per tablet 1 tablet (1 tablet Oral Given 04/13/18 1642)     Initial Impression / Assessment and Plan / ED Course  I have reviewed the triage vital signs and the nursing notes.  Pertinent labs & imaging results that were available during my care of the patient were reviewed by me and considered in  my medical decision making (see chart for details).    Healthy very kind elderly woman  presents with worsening left flank pain without injury.  Discussed differential diagnosis.  Reviewed recent blood work and normal kidney function.  Urinalysis performed and reviewed no hematuria no sign of infection.  Plan for CT stone study to look for kidney stone or other anatomic cause of her pain.  Pain medicines given.   Patient comfortable walking around pain improved significantly.  CT scan no acute abnormalities.  Patient family comfortable with close outpatient follow-up with primary doctor and will return for worsening or new symptoms.  We discussed that contrast was not utilized in the study and if pain worsened or persisted she would likely need another imaging modality. Blood pressure elevated will need reassessed by primary doctor. No CP, sob or stroke sxs.   Results and differential diagnosis were discussed with the patient/parent/guardian. Xrays were independently reviewed by myself.  Close follow up outpatient was discussed, comfortable with the plan.   Medications  HYDROcodone-acetaminophen (NORCO/VICODIN) 5-325 MG per tablet 1 tablet (1 tablet Oral Given 04/13/18 1642)    Vitals:   04/13/18 1545 04/13/18 1546 04/13/18 1550 04/13/18 1644  BP: (!) 177/138  (!) 194/100 (!) 197/90  Pulse: 76  72 80  Resp:   16 16  Temp:   98.5 F (36.9 C)   TempSrc:   Oral   SpO2: 99%  100% 98%  Weight:  59 kg    Height:  5' (1.524 m)      Final diagnoses:  Acute left flank pain  HTN  Final Clinical Impressions(s) / ED Diagnoses   Final diagnoses:  Acute left flank pain    ED Discharge Orders    None       Elnora Morrison, MD 04/13/18 1820

## 2018-04-13 NOTE — Discharge Instructions (Addendum)
Follow-up closely with your doctor.  Return to the ER if you develop worsening or new symptoms before you see your clinician. Have your blood pressure rechecked early this week.  Take your pain medicines at home as directed. If you were given medicines take as directed.  If you are on coumadin or contraceptives realize their levels and effectiveness is altered by many different medicines.  If you have any reaction (rash, tongues swelling, other) to the medicines stop taking and see a physician.    If your blood pressure was elevated in the ER make sure you follow up for management with a primary doctor or return for chest pain, shortness of breath or stroke symptoms.  Please follow up as directed and return to the ER or see a physician for new or worsening symptoms.  Thank you. Vitals:   04/13/18 1545 04/13/18 1546 04/13/18 1550 04/13/18 1644  BP: (!) 177/138  (!) 194/100 (!) 197/90  Pulse: 76  72 80  Resp:   16 16  Temp:   98.5 F (36.9 C)   TempSrc:   Oral   SpO2: 99%  100% 98%  Weight:  59 kg    Height:  5' (1.524 m)

## 2018-04-15 ENCOUNTER — Telehealth: Payer: Self-pay | Admitting: *Deleted

## 2018-04-15 NOTE — Telephone Encounter (Signed)
Received message from Dr. Mariea Clonts: "Anita,Ms. Vidana will need an ED follow up visit (flank Pain) with next available provider if she is willing to come. [facility] can provide transportation for her."  Called patient and she stated that she has an appointment for 04/17/18 with Dr. Mariea Clonts at [facility] and does not want to come in any sooner. Stated that she is feeling much better.  Appointment confirmed.

## 2018-04-15 NOTE — Telephone Encounter (Signed)
Great, I was not aware she already had that appt.  That's perfect.  Thanks.

## 2018-04-17 ENCOUNTER — Non-Acute Institutional Stay: Payer: Medicare Other | Admitting: Internal Medicine

## 2018-04-17 ENCOUNTER — Encounter: Payer: Self-pay | Admitting: Internal Medicine

## 2018-04-17 DIAGNOSIS — F419 Anxiety disorder, unspecified: Secondary | ICD-10-CM

## 2018-04-17 DIAGNOSIS — R109 Unspecified abdominal pain: Secondary | ICD-10-CM | POA: Diagnosis not present

## 2018-04-17 DIAGNOSIS — I10 Essential (primary) hypertension: Secondary | ICD-10-CM | POA: Diagnosis not present

## 2018-04-17 MED ORDER — DICLOFENAC SODIUM 1 % TD CREA: 1.0000 "application " | TOPICAL_CREAM | TRANSDERMAL | 0 refills | Status: DC | PRN

## 2018-04-17 MED ORDER — HYDROCODONE-ACETAMINOPHEN 5-325 MG PO TABS: 1.0000 | ORAL_TABLET | Freq: Three times a day (TID) | ORAL | 0 refills | Status: DC | PRN

## 2018-04-17 MED ORDER — SERTRALINE HCL 25 MG PO TABS: 25.0000 mg | ORAL_TABLET | Freq: Every day | ORAL | 1 refills | Status: DC

## 2018-04-17 NOTE — Progress Notes (Signed)
Location:  [facility] clinic Provider: Scotlyn Mccranie L. Mariea Clonts, D.O., C.M.D.  Code Status: DNR, MOST Goals of Care:  Advanced Directives 04/17/2018  Does Patient Have a Medical Advance Directive? Yes  Type of Paramedic of [place];Living will;Out of facility DNR (pink MOST or yellow form)  Does patient want to make changes to medical advance directive? No - Patient declined  Copy of [place] in Chart? Yes - validated most recent copy scanned in chart (See row information)  Pre-existing out of facility DNR order (yellow form or pink MOST form) Pink MOST form placed in chart (order not valid for inpatient use);Yellow form placed in chart (order not valid for inpatient use)     Chief Complaint  Patient presents with  . Medical Management of Chronic Issues    32mth follow-up, discuss ED visit    HPI: Patient is a 85 y.o. female seen today for hospital follow-up s/p ED visit on 04/13/18 with left flank pain for almost 2 days.  She used tramadol and tylenol with minimal relief.  CT stone study done which was negative.  BP was elevated.  She was given pain medication (hydrocodone) one tablet there.  She does not know what brought on the flank pain.  Pain is gone now.  It was stabbing.  It was a nuisance.  She'd used heat and ice.  She also took tramadol and used her 85 yr old diclofenac gel.    Sometimes, she feels lightheaded and occasionally gets a headache.  She has not checked her bp then.  The nurses had wanted her to take her problem serious, but then she got very anxious.  Her daughter suggests she takes a medication regularly for anxiety.    BP here is 140/70.    Takes tramadol three times a day if she's hurting, nervous or upset.  Will interact with an ssri  She just had her mammogram.  The techs said it's up to her if she wants to take them.  She has dense breasts.  Her daughter has had breast cancer.  Pt would not want anything done about a mammogram  that was normal if she had one.  Does not want to get them anymore.  Feels like life is harder for her.  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  She does not watch the news.  She does watch Narda Rutherford.  It stresses her out.    She is walking.  She loved the pool, but it's being renovated.  Her daughter has recommended the chair exercises.  Sheets are heavy to take out of the dryer.  Her daughter says she will check on laundry service.    Past Medical History:  Diagnosis Date  . Allergic rhinitis    pollen and grass  . Asthma   . Blepharitis   . Breech delivery 1962  . Carpal tunnel syndrome, bilateral 07/03/2016  . Delivery normal 1960  . Hyperlipidemia   . Irregular heart rhythm   . Macular degeneration 2012  . Movement disorder    Tremor  . Osteoporosis   . Sensorineural hearing loss, bilateral 2007  . Shingles 2004  . Skin cancer 2005   nose  . Torn meniscus 2006   right knee  . Tremor 2006   familiar.  dx by Dr. Quillian Quince  . Unspecified essential hypertension   . Unspecified hypothyroidism   . Urinary frequency     Past Surgical History:  Procedure Laterality Date  .  ABDOMINAL HYSTERECTOMY  1977  . CATARACT EXTRACTION  1974   Left, IOL implants  . CATARACT EXTRACTION Right 1979   IOL implants  . CHOLECYSTECTOMY  1981  . COSMETIC SURGERY  1995   eye  . [place]  . DILATION AND CURETTAGE OF UTERUS  1977  . [place]  . HIATAL HERNIA REPAIR  1977  . SKIN CANCER EXCISION  2005   nose  . THYROIDECTOMY  1955  . TONSILLECTOMY  1931    Allergies  Allergen Reactions  . Clindamycin/Lincomycin     diarrhea  . Penicillins     Outpatient Encounter Medications as of 04/17/2018  Medication Sig  . albuterol (PROVENTIL HFA;VENTOLIN HFA) 108 (90 Base) MCG/ACT inhaler Inhale 2 puffs into the lungs as directed.  Marland Kitchen ALPRAZolam (XANAX) 0.25 MG tablet Take 1 tablet (0.25 mg total) by mouth daily as needed.  . Azelastine HCl  0.15 % SOLN USE 1 SPRAY NASALLY TWICE A DAY  . calcium carbonate (TUMS - DOSED IN MG ELEMENTAL CALCIUM) 500 MG chewable tablet Chew 1 tablet by mouth daily.  . Cholecalciferol (VITAMIN D) 2000 units CAPS Take 1 capsule (2,000 Units total) by mouth daily.  . Diclofenac Sodium 1 % CREA Place 1 application onto the skin as needed.  . docusate sodium (COLACE) 100 MG capsule Take 200 mg by mouth daily as needed.    . fluticasone furoate-vilanterol (BREO ELLIPTA) 100-25 MCG/INH AEPB Inhale 1 puff into the lungs daily.  Marland Kitchen loratadine (CLARITIN) 10 MG tablet Take 10 mg by mouth daily. As needed  . minoxidil (ROGAINE) 2 % external solution Apply topically 2 (two) times daily.    . Saline (SIMPLY SALINE) 0.9 % AERS 1 spray by Nasal route 2 (two) times daily.    . [DISCONTINUED] Diclofenac Sodium 1 % CREA Place 1 application onto the skin as needed.  . [DISCONTINUED] traMADol (ULTRAM) 50 MG tablet Take 1 tablet (50 mg total) by mouth 3 (three) times daily as needed.  Marland Kitchen HYDROcodone-acetaminophen (NORCO) 5-325 MG tablet Take 1 tablet by mouth 3 (three) times daily as needed for severe pain.  Marland Kitchen sertraline (ZOLOFT) 25 MG tablet Take 1 tablet (25 mg total) by mouth daily.   No facility-administered encounter medications on file as of 04/17/2018.     Review of Systems:  Review of Systems  Constitutional: Positive for malaise/fatigue. Negative for chills and fever.  HENT: Positive for hearing loss. Negative for congestion.   Eyes: Positive for blurred vision.       Watery eyes, glasses  Respiratory: Negative for cough and shortness of breath.   Cardiovascular: Negative for chest pain, palpitations and leg swelling.       Using compression hose soon as recommended by cardiologist  Gastrointestinal: Negative for abdominal pain, blood in stool, constipation, diarrhea and melena.  Genitourinary: Negative for dysuria.  Musculoskeletal: Positive for back pain and joint pain. Negative for falls and myalgias.  Skin:  Negative for itching and rash.    Health Maintenance  Topic Date Due  . TETANUS/TDAP  04/07/2025  . INFLUENZA VACCINE  Completed  . DEXA SCAN  Completed  . PNA vac Low Risk Adult  Completed    Physical Exam: Vitals:   04/17/18 0822  BP: 140/70  Pulse: 81  Temp: 97.6 F (36.4 C)  TempSrc: Oral  SpO2: 96%  Weight: 129 lb (58.5 kg)  Height: 5' (1.524 m)   Body mass index is 25.19 kg/m. Physical Exam Vitals signs reviewed.  Constitutional:      Appearance: Normal appearance.  HENT:     Head: Normocephalic and atraumatic.     Ears:     Comments: HOH Eyes:     Comments: glasses  Cardiovascular:     Pulses: Normal pulses.     Heart sounds: Normal heart sounds.  Pulmonary:     Effort: Pulmonary effort is normal.     Breath sounds: Normal breath sounds.  Abdominal:     General: Bowel sounds are normal. There is no distension.     Palpations: There is no mass.     Tenderness: There is no abdominal tenderness. There is no guarding.  Musculoskeletal: Normal range of motion.  Skin:    General: Skin is warm and dry.     Capillary Refill: Capillary refill takes less than 2 seconds.  Neurological:     General: No focal deficit present.     Mental Status: She is alert and oriented to person, place, and time.     Cranial Nerves: Cranial nerve deficit present.  Psychiatric:     Comments: anxious     Labs reviewed: Basic Metabolic Panel: Recent Labs    10/02/17 0600 01/24/18 1401 04/09/18 0500  NA 143 140 143  K 4.7 4.3 5.2  CL  --  104  --   CO2  --  29  --   GLUCOSE  --  94  --   BUN 12 15 14   CREATININE 0.6 0.79 0.7  CALCIUM  --  9.6  --    Liver Function Tests: Recent Labs    10/02/17 0600 01/24/18 1401  AST 23 20  ALT 18 19  ALKPHOS 66  --   BILITOT  --  0.6  PROT  --  6.9   No results for input(s): LIPASE, AMYLASE in the last 8760 hours. No results for input(s): AMMONIA in the last 8760 hours. CBC: Recent Labs    10/02/17 0600 01/24/18 1401   WBC 5.6 7.1  NEUTROABS  --  4,629  HGB 14.1 13.9  HCT 42 41.8  MCV  --  94.6  PLT 222 262   Lipid Panel: Recent Labs    10/02/17 0600 04/09/18 0500  CHOL 230* 220*  HDL 63 64  LDLCALC 144 137  TRIG 116 96   Lab Results  Component Value Date   HGBA1C 5.9 10/02/2017    Procedures since last visit: Ct Renal Stone Study  Result Date: 04/13/2018 CLINICAL DATA:  Left flank pain today. EXAM: CT ABDOMEN AND PELVIS WITHOUT CONTRAST TECHNIQUE: Multidetector CT imaging of the abdomen and pelvis was performed following the standard protocol without IV contrast. COMPARISON:  None. FINDINGS: Lower chest: Heart size is mildly enlarged. Calcific coronary artery disease noted. No pleural or pericardial effusion. Lung bases clear. Hepatobiliary: No focal liver abnormality is seen. Status post cholecystectomy. No biliary dilatation. Pancreas: Unremarkable. No pancreatic ductal dilatation or surrounding inflammatory changes. Spleen: Normal in size without focal abnormality. Adrenals/Urinary Tract: Bilateral renal cysts are identified. The largest cyst is off the upper pole of the left kidney and measures approximately 7.5 cm in diameter. There are also some parapelvic left renal cysts. No renal or ureteral stones. No hydronephrosis. Urinary bladder is unremarkable. Stomach/Bowel: Diverticulosis without diverticulitis is most notable in the sigmoid. The colon is otherwise unremarkable. Small bowel and appendix appear normal. Vascular/Lymphatic: Aortic atherosclerosis. No enlarged abdominal or pelvic lymph nodes. Reproductive: Status post hysterectomy. No adnexal masses. Other: None. Musculoskeletal: No acute or focal bony  abnormality. Degenerative disease about the hips and lumbar spondylosis noted. IMPRESSION: No acute abnormality abdomen or pelvis. Negative for urinary tract stone. Mild cardiomegaly. Moderate hiatal hernia. Calcific aortic and coronary atherosclerosis. Diverticulosis without diverticulitis.  Electronically Signed   By: Twice a week bp with Mliss Sax and then f/u on bps   Assessment/Plan 1. Anxiety - start SSRI to help decrease anxiety, may still use prn xanax in the event of panic attacks (see instructions) - sertraline (ZOLOFT) 25 MG tablet; Take 1 tablet (25 mg total) by mouth daily.  Dispense: 30 tablet; Refill: 1 -regular xanax not recommended in geriatrics unless SSRI not helpful  2. Flank pain - resolved, but also has OA pains some days and was using tramadol--changed to norco due to potential serotonin syndrome with tramadol and zoloft together - HYDROcodone-acetaminophen (NORCO) 5-325 MG tablet; Take 1 tablet by mouth 3 (three) times daily as needed for severe pain.  Dispense: 90 tablet; Refill: 0 - Diclofenac Sodium 1 % CREA; Place 1 application onto the skin as needed.  Dispense: 120 g; Refill: 0 refilled  3. Essential hypertension -check bp twice weekly with clinic nurse here at [facility] and then we'll meet in 4 wks to review the bps  Labs/tests ordered:  No orders of the defined types were placed in this encounter.   Next appt:  4 wks bp check 05/15/2018  Aarya Robinson L. Hussain Maimone, D.O. [facility] [address], [place] [id number] Cell Phone (Mon-Fri 8am-5pm):  [phone] On Call:  [phone] & follow prompts after 5pm & weekends Office Phone:  [phone] Office Fax:  [phone]

## 2018-04-17 NOTE — Patient Instructions (Signed)
Please visit Mliss Sax or Katharine Look twice a week in the big house for blood pressure checks and keep a record.  We will meet in 4 weeks to review the results and make adjustments if needed.  Begin zoloft 25mg  daily for anxiety.  You may still use your xanax if needed for panic attacks.  Because of your new anxiety medication, we need to change your pain medicine--stop tramadol.  Start hydrocodone 5/325mg  by mouth up the three times daily as needed for severe pain.  I also renewed your diclofenac gel.

## 2018-04-29 ENCOUNTER — Telehealth: Payer: Self-pay | Admitting: Internal Medicine

## 2018-04-29 NOTE — Telephone Encounter (Signed)
Reviewed. Glad she is feeling well.

## 2018-04-29 NOTE — Telephone Encounter (Signed)
FYI-update fwd to Dr.Acharya.  Patient seen by Dr.Acharya on 03/29/18 new pt appt dx syncope. She was given the following instructions and was to f/u prn.  -Please stay hydrated -Wear compression stockings daily -Call us in 1 month to give an update -Call us if you decide to have the echocardiogram

## 2018-04-29 NOTE — Telephone Encounter (Signed)
Patient states she is calling because Dr. Margaretann Loveless told her to call in a month with an update, she states everything is going well. Patient has decided not to have a Echo done at this time.

## 2018-05-02 ENCOUNTER — Encounter: Payer: Self-pay | Admitting: Allergy & Immunology

## 2018-05-02 ENCOUNTER — Ambulatory Visit (INDEPENDENT_AMBULATORY_CARE_PROVIDER_SITE_OTHER): Payer: Medicare Other | Admitting: Allergy & Immunology

## 2018-05-02 VITALS — BP 150/80 | HR 64 | Resp 16

## 2018-05-02 DIAGNOSIS — J4540 Moderate persistent asthma, uncomplicated: Secondary | ICD-10-CM

## 2018-05-02 DIAGNOSIS — J302 Other seasonal allergic rhinitis: Secondary | ICD-10-CM

## 2018-05-02 NOTE — Progress Notes (Signed)
FOLLOW UP  Date of Service/Encounter:  05/02/18   Assessment:   Moderate persistent asthma without complication   Seasonal allergic rhinitis  TIA (summer 2019)   Asthma Reportables:  Severity: moderate persistent  Risk: low Control: well controlled    Plan/Recommendations:   1. Mild persistent asthma, uncomplicated - Lung testing looked great today. - Continue with the Breo one puff once daily. - Continue with albuterol 2-4 puffs every 4-6 hours as needed.   2. Allergic rhinoconjunctivitis - Continue with fluticasone and azelastine (essentially the drugs in Dymista separated out): do one spray per nostril twice daily.  - Continue with Claritin (lortadine) as needed.   3. Return in about 6 months (around 10/31/2018).  Subjective:   Christina Lawson is a 85 y.o. female presenting today for follow up of  Chief Complaint  Patient presents with  . Asthma    doing good    Christina Lawson has a history of the following: Patient Active Problem List   Diagnosis Date Noted  . Left thigh pain 10/10/2017  . Anxiety 10/10/2017  . H/O TIA (transient ischemic attack) and stroke 10/10/2017  . Moderate persistent asthma without complication 25/95/6387  . Parkinsonism ([place]) 04/11/2017  . Mild persistent asthma without complication 56/43/3295  . Seasonal allergic rhinitis 04/05/2017  . Carpal tunnel syndrome, bilateral 07/03/2016  . Numbness and tingling in both hands 06/05/2016  . Drooling 04/12/2016  . Cornea guttata 06/03/2015  . Posterior vitreous detachment of both eyes 06/03/2015  . Hyperglycemia 03/24/2015  . Senile osteopenia 03/24/2015  . 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 Murmur, cardiac 03/24/2015  . Conjunctivitis 01/12/2014  . Edema 08/20/2013  . Senile ecchymosis 06/23/2013  . Rash and nonspecific skin eruption 03/26/2013  . Pain in left knee 01/06/2013  . Osteopenia 12/16/2012  . Urinary frequency 09/23/2012  . Impacted cerumen  09/23/2012  . Essential hypertension   . Hyperlipidemia   . Tremor, essential   . Nonexudative age-related macular degeneration 05/23/2012  . Status post intraocular lens implant 05/23/2012  . Cardiomegaly - hypertensive 08/10/2010  . Asthma, intrinsic 05/31/2010  . Dyspnea 05/31/2010    History obtained from: chart review and patient.  Christina Lawson is a 85 y.o. female presenting for a follow up visit.  She was last seen in August 2019.  At that time, she was doing very well.  We continued her on Breo 1 puff once daily as well as albuterol every 4-6 hours as needed.  For her allergic rhinoconjunctivitis, we continued with fluticasone and Astelin nasal sprays twice daily as well as Claritin as needed.  Asthma/Respiratory Symptom History: She remains on the Breo one puff once daily. This is providing good control of her symptoms. She has not required any ED visits for her asthma. She rarely uses her inhaler for rescue purposes and her inhalers typically expire. She has not needed prednisone in quite some time.  Allergic Rhinitis Symptom History: She remains on the two medicated nasal sprays twice daily. She also uses simply saline nasal sprays when needed for congestion in the interim. She does have loratadine on hand to use as well. She has not needed antibiotics for sinusitis since the last visit.  Otherwise, there is no history of other atopic diseases, including food allergies, drug allergies, stinging insect allergies, eczema, urticaria or contact dermatitis. There is no significant infectious history. Vaccinations are up to date.   She has hypertension and is on a couple of new medications for this. She was also placed on some  anxiety medications to help with her blood pressure. She thinks that this is related to the current political environment.  Otherwise, there have been no changes to her past medical history, surgical history, family history, or social history. The pool just opened up at  [facility] and she is excited about this. It has been closed for a month for repairs. This is an indoor pool.     Review of Systems  Constitutional: Negative.  Negative for fever, malaise/fatigue and weight loss.  HENT: Negative.  Negative for congestion, ear discharge and ear pain.   Eyes: Negative for pain, discharge and redness.  Respiratory: Negative for cough, sputum production, shortness of breath and wheezing.   Cardiovascular: Negative.  Negative for chest pain and palpitations.  Gastrointestinal: Negative for abdominal pain and heartburn.  Skin: Negative.  Negative for itching and rash.  Neurological: Negative for dizziness and headaches.  Endo/Heme/Allergies: Negative for environmental allergies. Does not bruise/bleed easily.       Objective:   Blood pressure (!) 150/80, pulse 64, resp. rate 16. There is no height or weight on file to calculate BMI.   Physical Exam:  Physical Exam  Constitutional: She appears well-developed.  HENT:  Head: Normocephalic and atraumatic.  Right Ear: External ear normal.  Nose: No mucosal edema, rhinorrhea, nasal deformity or septal deviation. No epistaxis. Right sinus exhibits no maxillary sinus tenderness and no frontal sinus tenderness. Left sinus exhibits no maxillary sinus tenderness and no frontal sinus tenderness.  Mouth/Throat: Uvula is midline and oropharynx is clear and moist. Mucous membranes are not pale and not dry.  Hearing aids in placed bilaterally (did not remove them to examine her TMs).  Eyes: Pupils are equal, round, and reactive to light. Conjunctivae and EOM are normal. Right eye exhibits no chemosis and no discharge. Left eye exhibits no chemosis and no discharge. Right conjunctiva is not injected. Left conjunctiva is not injected.  Cardiovascular: Normal rate, regular rhythm and normal heart sounds.  Respiratory: Effort normal and breath sounds normal. No accessory muscle usage. No tachypnea. No respiratory distress.  She has no wheezes. She has no rhonchi. She has no rales. She exhibits no tenderness.  Lymphadenopathy:    She has no cervical adenopathy.  Neurological: She is alert.  Skin: No abrasion, no petechiae and no rash noted. Rash is not papular, not vesicular and not urticarial. No erythema. No pallor.  Psychiatric: She has a normal mood and affect.     Diagnostic studies:    Spirometry: results normal (FEV1: 1.07/96%, FVC: 1.26/81%, FEV1/FVC: 85%).    Spirometry consistent with normal pattern.   Allergy Studies: none      Salvatore Marvel, MD  [facility]

## 2018-05-02 NOTE — Patient Instructions (Addendum)
1. Mild persistent asthma, uncomplicated - Lung testing looked great today. - Continue with the Breo one puff once daily. - Continue with albuterol 2-4 puffs every 4-6 hours as needed.   2. Allergic rhinoconjunctivitis - Continue with fluticasone and azelastine (essentially the drugs in Dymista separated out): do one spray per nostril twice daily.  - Continue with Claritin (lortadine) as needed.   3. Return in about 6 months (around 10/31/2018).  Please inform us of any Emergency Department visits, hospitalizations, or changes in symptoms. Call us before going to the ED for breathing or allergy symptoms since we might be able to fit you in for a sick visit. Feel free to contact us anytime with any questions, problems, or concerns.  It was a pleasure to see you again today! I absolutely love seeing you!   Websites that have reliable patient information: 1. American Academy of Asthma, Allergy, and Immunology: www.aaaai.org 2. Food Allergy Research and Education (FARE): foodallergy.org 3. Mothers of Asthmatics: http://www.asthmacommunitynetwork.org 4. American College of Allergy, Asthma, and Immunology: www.acaai.org

## 2018-05-13 ENCOUNTER — Emergency Department (HOSPITAL_COMMUNITY): Payer: Medicare Other

## 2018-05-13 ENCOUNTER — Other Ambulatory Visit: Payer: Self-pay

## 2018-05-13 ENCOUNTER — Encounter (HOSPITAL_COMMUNITY): Payer: Self-pay | Admitting: Emergency Medicine

## 2018-05-13 ENCOUNTER — Observation Stay (HOSPITAL_COMMUNITY)
Admission: EM | Admit: 2018-05-13 | Discharge: 2018-05-14 | Disposition: A | Discharge status: Home / Self Care | Payer: Medicare Other | Attending: Family Medicine | Admitting: Family Medicine

## 2018-05-13 ENCOUNTER — Telehealth: Payer: Self-pay | Admitting: *Deleted

## 2018-05-13 DIAGNOSIS — F419 Anxiety disorder, unspecified: Secondary | ICD-10-CM | POA: Diagnosis not present

## 2018-05-13 DIAGNOSIS — Z66 Do not resuscitate: Secondary | ICD-10-CM | POA: Insufficient documentation

## 2018-05-13 DIAGNOSIS — Z79899 Other long term (current) drug therapy: Secondary | ICD-10-CM | POA: Diagnosis not present

## 2018-05-13 DIAGNOSIS — I480 Paroxysmal atrial fibrillation: Secondary | ICD-10-CM

## 2018-05-13 DIAGNOSIS — Z8673 Personal history of transient ischemic attack (TIA), and cerebral infarction without residual deficits: Secondary | ICD-10-CM | POA: Diagnosis not present

## 2018-05-13 DIAGNOSIS — R079 Chest pain, unspecified: Secondary | ICD-10-CM

## 2018-05-13 DIAGNOSIS — R739 Hyperglycemia, unspecified: Secondary | ICD-10-CM | POA: Diagnosis not present

## 2018-05-13 DIAGNOSIS — Z7982 Long term (current) use of aspirin: Secondary | ICD-10-CM | POA: Insufficient documentation

## 2018-05-13 DIAGNOSIS — E785 Hyperlipidemia, unspecified: Secondary | ICD-10-CM | POA: Insufficient documentation

## 2018-05-13 DIAGNOSIS — I10 Essential (primary) hypertension: Secondary | ICD-10-CM | POA: Diagnosis not present

## 2018-05-13 DIAGNOSIS — I444 Left anterior fascicular block: Secondary | ICD-10-CM | POA: Insufficient documentation

## 2018-05-13 DIAGNOSIS — I213 ST elevation (STEMI) myocardial infarction of unspecified site: Secondary | ICD-10-CM | POA: Diagnosis not present

## 2018-05-13 DIAGNOSIS — R0600 Dyspnea, unspecified: Secondary | ICD-10-CM | POA: Diagnosis present

## 2018-05-13 DIAGNOSIS — I214 Non-ST elevation (NSTEMI) myocardial infarction: Secondary | ICD-10-CM | POA: Diagnosis not present

## 2018-05-13 DIAGNOSIS — I499 Cardiac arrhythmia, unspecified: Secondary | ICD-10-CM | POA: Diagnosis not present

## 2018-05-13 DIAGNOSIS — R0789 Other chest pain: Secondary | ICD-10-CM | POA: Diagnosis not present

## 2018-05-13 DIAGNOSIS — I4891 Unspecified atrial fibrillation: Principal | ICD-10-CM

## 2018-05-13 DIAGNOSIS — K449 Diaphragmatic hernia without obstruction or gangrene: Secondary | ICD-10-CM | POA: Diagnosis not present

## 2018-05-13 DIAGNOSIS — R7989 Other specified abnormal findings of blood chemistry: Secondary | ICD-10-CM | POA: Diagnosis not present

## 2018-05-13 DIAGNOSIS — E039 Hypothyroidism, unspecified: Secondary | ICD-10-CM | POA: Diagnosis present

## 2018-05-13 DIAGNOSIS — I700 Atherosclerosis of aorta: Secondary | ICD-10-CM | POA: Diagnosis not present

## 2018-05-13 DIAGNOSIS — I252 Old myocardial infarction: Secondary | ICD-10-CM | POA: Insufficient documentation

## 2018-05-13 DIAGNOSIS — I340 Nonrheumatic mitral (valve) insufficiency: Secondary | ICD-10-CM | POA: Diagnosis not present

## 2018-05-13 DIAGNOSIS — R0902 Hypoxemia: Secondary | ICD-10-CM | POA: Diagnosis not present

## 2018-05-13 DIAGNOSIS — R0602 Shortness of breath: Secondary | ICD-10-CM | POA: Diagnosis not present

## 2018-05-13 DIAGNOSIS — Z7952 Long term (current) use of systemic steroids: Secondary | ICD-10-CM | POA: Diagnosis not present

## 2018-05-13 DIAGNOSIS — Z85828 Personal history of other malignant neoplasm of skin: Secondary | ICD-10-CM | POA: Diagnosis not present

## 2018-05-13 HISTORY — DX: Family history of other specified conditions: Z84.89

## 2018-05-13 HISTORY — DX: Unspecified atrial fibrillation: I48.91

## 2018-05-13 LAB — CBC WITH DIFFERENTIAL/PLATELET
Abs Immature Granulocytes: 0.05 10*3/uL (ref 0.00–0.07)
Basophils Absolute: 0 10*3/uL (ref 0.0–0.1)
Basophils Relative: 0 %
Eosinophils Absolute: 0 10*3/uL (ref 0.0–0.5)
Eosinophils Relative: 1 %
HCT: 45.1 % (ref 36.0–46.0)
Hemoglobin: 14.6 g/dL (ref 12.0–15.0)
Immature Granulocytes: 1 %
Lymphocytes Relative: 15 %
Lymphs Abs: 1.3 10*3/uL (ref 0.7–4.0)
MCH: 31.7 pg (ref 26.0–34.0)
MCHC: 32.4 g/dL (ref 30.0–36.0)
MCV: 97.8 fL (ref 80.0–100.0)
Monocytes Absolute: 0.5 10*3/uL (ref 0.1–1.0)
Monocytes Relative: 6 %
NEUTROS ABS: 6.8 10*3/uL (ref 1.7–7.7)
NEUTROS PCT: 77 %
Platelets: 246 10*3/uL (ref 150–400)
RBC: 4.61 MIL/uL (ref 3.87–5.11)
RDW: 13.2 % (ref 11.5–15.5)
WBC: 8.7 10*3/uL (ref 4.0–10.5)
nRBC: 0 % (ref 0.0–0.2)

## 2018-05-13 LAB — I-STAT TROPONIN, ED: Troponin i, poc: 0.14 ng/mL (ref 0.00–0.08)

## 2018-05-13 LAB — COMPREHENSIVE METABOLIC PANEL
ALT: 20 U/L (ref 0–44)
ANION GAP: 13 (ref 5–15)
AST: 28 U/L (ref 15–41)
Albumin: 4 g/dL (ref 3.5–5.0)
Alkaline Phosphatase: 58 U/L (ref 38–126)
BUN: 15 mg/dL (ref 8–23)
CALCIUM: 9.9 mg/dL (ref 8.9–10.3)
CO2: 22 mmol/L (ref 22–32)
Chloride: 105 mmol/L (ref 98–111)
Creatinine, Ser: 1.11 mg/dL — ABNORMAL HIGH (ref 0.44–1.00)
GFR calc Af Amer: 50 mL/min — ABNORMAL LOW (ref >60)
GFR calc non Af Amer: 43 mL/min — ABNORMAL LOW (ref >60)
Glucose, Bld: 185 mg/dL — ABNORMAL HIGH (ref 70–99)
Potassium: 3.8 mmol/L (ref 3.5–5.1)
Sodium: 140 mmol/L (ref 135–145)
TOTAL PROTEIN: 7.3 g/dL (ref 6.5–8.1)
Total Bilirubin: 1.2 mg/dL (ref 0.3–1.2)

## 2018-05-13 LAB — TSH: TSH: 4.418 u[IU]/mL (ref 0.350–4.500)

## 2018-05-13 LAB — T4, FREE: Free T4: 1.09 ng/dL (ref 0.82–1.77)

## 2018-05-13 LAB — TROPONIN I
TROPONIN I: 3.7 ng/mL — AB (ref <0.03)
Troponin I: 7.28 ng/mL (ref <0.03)

## 2018-05-13 MED ORDER — LORATADINE 10 MG PO TABS: 10.0000 mg | ORAL_TABLET | Freq: Every day | ORAL | Status: DC | PRN

## 2018-05-13 MED ORDER — METOPROLOL TARTRATE 25 MG PO TABS: 12.5000 mg | ORAL_TABLET | Freq: Two times a day (BID) | ORAL | Status: DC

## 2018-05-13 MED ORDER — SALINE 0.9 % NA AERS: 1.0000 | INHALATION_SPRAY | Freq: Two times a day (BID) | NASAL | Status: DC

## 2018-05-13 MED ORDER — ACETAMINOPHEN 325 MG PO TABS: 650.0000 mg | ORAL_TABLET | ORAL | Status: DC | PRN

## 2018-05-13 MED ORDER — ONDANSETRON HCL 4 MG/2ML IJ SOLN: 4.0000 mg | Freq: Four times a day (QID) | INTRAMUSCULAR | Status: DC | PRN

## 2018-05-13 MED ORDER — HYDROCODONE-ACETAMINOPHEN 5-325 MG PO TABS: 1.0000 | ORAL_TABLET | Freq: Three times a day (TID) | ORAL | Status: DC | PRN

## 2018-05-13 MED ORDER — ENOXAPARIN SODIUM 30 MG/0.3ML ~~LOC~~ SOLN
30.0000 mg | SUBCUTANEOUS | Status: DC
  Administered 2018-05-13: 30 mg via SUBCUTANEOUS
  Filled 2018-05-13: qty 0.3

## 2018-05-13 MED ORDER — ASPIRIN 81 MG PO CHEW
324.0000 mg | CHEWABLE_TABLET | Freq: Once | ORAL | Status: AC
  Administered 2018-05-13: 324 mg via ORAL
  Filled 2018-05-13: qty 4

## 2018-05-13 MED ORDER — ALBUTEROL SULFATE (2.5 MG/3ML) 0.083% IN NEBU
2.5000 mg | INHALATION_SOLUTION | Freq: Four times a day (QID) | RESPIRATORY_TRACT | Status: DC | PRN
  Administered 2018-05-13: 2.5 mg via RESPIRATORY_TRACT
  Filled 2018-05-13: qty 3

## 2018-05-13 MED ORDER — SERTRALINE HCL 25 MG PO TABS
25.0000 mg | ORAL_TABLET | Freq: Every day | ORAL | Status: DC
  Administered 2018-05-14: 25 mg via ORAL
  Filled 2018-05-13: qty 1

## 2018-05-13 MED ORDER — FLUTICASONE FUROATE-VILANTEROL 100-25 MCG/INH IN AEPB
1.0000 | INHALATION_SPRAY | Freq: Every day | RESPIRATORY_TRACT | Status: DC
  Administered 2018-05-14: 1 via RESPIRATORY_TRACT
  Filled 2018-05-13: qty 28

## 2018-05-13 MED ORDER — AZELASTINE HCL 0.15 % NA SOLN: 1.0000 | Freq: Two times a day (BID) | NASAL | Status: DC

## 2018-05-13 MED ORDER — DILTIAZEM HCL 25 MG/5ML IV SOLN
10.0000 mg | Freq: Once | INTRAVENOUS | Status: AC
  Administered 2018-05-13: 10 mg via INTRAVENOUS
  Filled 2018-05-13: qty 5

## 2018-05-13 MED ORDER — HEPARIN (PORCINE) 25000 UT/250ML-% IV SOLN
700.0000 [IU]/h | INTRAVENOUS | Status: DC
  Administered 2018-05-13: 700 [IU]/h via INTRAVENOUS
  Filled 2018-05-13: qty 250

## 2018-05-13 MED ORDER — AZELASTINE HCL 0.1 % NA SOLN
1.0000 | Freq: Two times a day (BID) | NASAL | Status: DC
  Administered 2018-05-13 – 2018-05-14 (×2): 1 via NASAL
  Filled 2018-05-13: qty 30

## 2018-05-13 MED ORDER — SALINE SPRAY 0.65 % NA SOLN
1.0000 | Freq: Two times a day (BID) | NASAL | Status: DC
  Administered 2018-05-13 – 2018-05-14 (×2): 1 via NASAL
  Filled 2018-05-13: qty 44

## 2018-05-13 MED ORDER — ALBUTEROL SULFATE (2.5 MG/3ML) 0.083% IN NEBU: 2.5000 mg | INHALATION_SOLUTION | RESPIRATORY_TRACT | Status: DC

## 2018-05-13 MED ORDER — ASPIRIN EC 81 MG PO TBEC
81.0000 mg | DELAYED_RELEASE_TABLET | Freq: Every day | ORAL | Status: DC
  Administered 2018-05-14: 81 mg via ORAL
  Filled 2018-05-13: qty 1

## 2018-05-13 MED ORDER — ALPRAZOLAM 0.25 MG PO TABS: 0.2500 mg | ORAL_TABLET | Freq: Every day | ORAL | Status: DC | PRN

## 2018-05-13 NOTE — ED Provider Notes (Signed)
[facility] EMERGENCY DEPARTMENT Provider Note   CSN: [id number] Arrival date & time: 05/13/18  1031    History   Chief Complaint Chief Complaint  Patient presents with  . Atrial Fibrillation    HPI Lexington Devine is a 85 y.o. female.     85 year old female with prior medical history as detailed below presents for evaluation of chest discomfort.  Of note, patient is a DNR with a MOST form that restricts treatment.  Patient reports acute onset of chest discomfort around 7 AM.  She describes a pressure.  EMS found her to be in atrial fibrillation with a heart rate in the 160s.  Her initial blood pressure was 90 systolic.  This has improved during transport.  Patient denies palpitations.  She denies prior history of atrial fibrillation.  Patient is alert, oriented, quite pleasant.  She is very emphatic that she does not want real aggressive measures taken in her care.  She specifically declines cardiac catheterization.  She is agreeable to less aggressive cardiac work-up including echocardiogram.  The history is provided by the patient and medical records.  Atrial Fibrillation  This is a new problem. The current episode started 3 to 5 hours ago. The problem occurs constantly. The problem has not changed since onset.Associated symptoms include chest pain. Pertinent negatives include no abdominal pain, no headaches and no shortness of breath. Nothing aggravates the symptoms. Nothing relieves the symptoms.    Past Medical History:  Diagnosis Date  . Allergic rhinitis    pollen and grass  . Asthma   . Blepharitis   . Breech delivery 1962  . Carpal tunnel syndrome, bilateral 07/03/2016  . Delivery normal 1960  . Hyperlipidemia   . Irregular heart rhythm   . Macular degeneration 2012  . Movement disorder    Tremor  . Osteoporosis   . Sensorineural hearing loss, bilateral 2007  . Shingles 2004  . Skin cancer 2005   nose  . Torn meniscus 2006   right knee    . Tremor 2006   familiar.  dx by Dr. Quillian Quince  . Unspecified essential hypertension   . Unspecified hypothyroidism   . Urinary frequency     Patient Active Problem List   Diagnosis Date Noted  . Left thigh pain 10/10/2017  . Anxiety 10/10/2017  . H/O TIA (transient ischemic attack) and stroke 10/10/2017  . Moderate persistent asthma without complication 44/05/4740  . Parkinsonism ([place]) 04/11/2017  . Mild persistent asthma without complication 59/56/3875  . Seasonal allergic rhinitis 04/05/2017  . Carpal tunnel syndrome, bilateral 07/03/2016  . Numbness and tingling in both hands 06/05/2016  . Drooling 04/12/2016  . Cornea guttata 06/03/2015  . Posterior vitreous detachment of both eyes 06/03/2015  . Hyperglycemia 03/24/2015  . Senile osteopenia 03/24/2015  . 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 Murmur, cardiac 03/24/2015  . Conjunctivitis 01/12/2014  . Edema 08/20/2013  . Senile ecchymosis 06/23/2013  . Rash and nonspecific skin eruption 03/26/2013  . Pain in left knee 01/06/2013  . Osteopenia 12/16/2012  . Urinary frequency 09/23/2012  . Impacted cerumen 09/23/2012  . Essential hypertension   . Hyperlipidemia   . Tremor, essential   . Nonexudative age-related macular degeneration 05/23/2012  . Status post intraocular lens implant 05/23/2012  . Cardiomegaly - hypertensive 08/10/2010  . Asthma, intrinsic 05/31/2010  . Dyspnea 05/31/2010    Past Surgical History:  Procedure Laterality Date  . ABDOMINAL HYSTERECTOMY  1977  . CATARACT EXTRACTION  1974   Left, IOL implants  .  CATARACT EXTRACTION Right 1979   IOL implants  . CHOLECYSTECTOMY  1981  . COSMETIC SURGERY  1995   eye  . [place]  . DILATION AND CURETTAGE OF UTERUS  1977  . [place]  . HIATAL HERNIA REPAIR  1977  . SKIN CANCER EXCISION  2005   nose  . THYROIDECTOMY  1955  . TONSILLECTOMY  1931     OB History   No obstetric history on file.      Home  Medications    Prior to Admission medications   Medication Sig Start Date End Date Taking? Authorizing Provider  albuterol (PROVENTIL HFA;VENTOLIN HFA) 108 (90 Base) MCG/ACT inhaler Inhale 2 puffs into the lungs as directed.    [provider]  ALPRAZolam Duanne Moron) 0.25 MG tablet Take 1 tablet (0.25 mg total) by mouth daily as needed. 03/11/18   Kathrynn Ducking, MD  aspirin EC 81 MG tablet Take 81 mg by mouth daily.    [provider]  Azelastine HCl 0.15 % SOLN USE 1 SPRAY NASALLY TWICE A DAY 12/12/17   Valentina Shaggy, MD  calcium carbonate (TUMS - DOSED IN MG ELEMENTAL CALCIUM) 500 MG chewable tablet Chew 1 tablet by mouth daily.    [provider]  Cholecalciferol (VITAMIN D) 2000 units CAPS Take 1 capsule (2,000 Units total) by mouth daily. 03/24/15   Reed, Tiffany L, DO  Diclofenac Sodium 1 % CREA Place 1 application onto the skin as needed. 04/17/18   Reed, Tiffany L, DO  docusate sodium (COLACE) 100 MG capsule Take 200 mg by mouth daily as needed.      [provider]  fluticasone furoate-vilanterol (BREO ELLIPTA) 100-25 MCG/INH AEPB Inhale 1 puff into the lungs daily. 05/10/17   Valentina Shaggy, MD  HYDROcodone-acetaminophen [facility]) 5-325 MG tablet Take 1 tablet by mouth 3 (three) times daily as needed for severe pain. 04/17/18   Reed, Tiffany L, DO  loratadine (CLARITIN) 10 MG tablet Take 10 mg by mouth daily. As needed    [provider]  minoxidil (ROGAINE) 2 % external solution Apply topically 2 (two) times daily.      [provider]  Saline (SIMPLY SALINE) 0.9 % AERS 1 spray by Nasal route 2 (two) times daily.      [provider]  sertraline (ZOLOFT) 25 MG tablet Take 1 tablet (25 mg total) by mouth daily. 04/17/18   Gayland Curry, DO    Family History Family History  Problem Relation Age of Onset  . Emphysema Father   . Allergies Father   . Asthma Father   . Brain cancer Mother   . Cancer Mother         Brain  . Hypertension Mother   . Cancer Sister        Cervical  . Endometrial cancer Sister   . Breast cancer Sister 85  . Cancer Sister        Breast, metastasized to bone  . Breast cancer Sister        Cher Nakai of age  . Breast cancer Daughter 52  . Heart disease Maternal Grandfather   . Stroke Other   . Breast cancer Maternal Aunt   . Breast cancer Paternal Aunt   . Breast cancer Cousin   . Breast cancer Maternal Aunt   . Breast cancer Cousin     Social History Social History   Tobacco Use  . Smoking status: Never Smoker  .  Smokeless tobacco: Never Used  Substance Use Topics  . Alcohol use: Yes    Comment: on occ.  . Drug use: No     Allergies   Clindamycin/lincomycin and Penicillins   Review of Systems Review of Systems  Respiratory: Negative for shortness of breath.   Cardiovascular: Positive for chest pain.  Gastrointestinal: Negative for abdominal pain.  Neurological: Negative for headaches.  All other systems reviewed and are negative.    Physical Exam Updated Vital Signs BP 126/66   Pulse 74   Resp (!) 23   Ht 5' (1.524 m)   Wt 59 kg   SpO2 99%   BMI 25.39 kg/m   Physical Exam Vitals signs and nursing note reviewed.  Constitutional:      General: She is not in acute distress.    Appearance: Normal appearance. She is well-developed.  HENT:     Head: Normocephalic and atraumatic.  Eyes:     Conjunctiva/sclera: Conjunctivae normal.     Pupils: Pupils are equal, round, and reactive to light.  Neck:     Musculoskeletal: Normal range of motion and neck supple.  Cardiovascular:     Rate and Rhythm: Tachycardia present. Rhythm irregular.     Heart sounds: Normal heart sounds.  Pulmonary:     Effort: Pulmonary effort is normal. No respiratory distress.     Breath sounds: Normal breath sounds.  Abdominal:     General: There is no distension.     Palpations: Abdomen is soft.     Tenderness: There is no abdominal tenderness.  Musculoskeletal:  Normal range of motion.        General: No deformity.  Skin:    General: Skin is warm and dry.  Neurological:     General: No focal deficit present.     Mental Status: She is alert and oriented to person, place, and time. Mental status is at baseline.     Cranial Nerves: No cranial nerve deficit.     Sensory: No sensory deficit.     Motor: No weakness.     Coordination: Coordination normal.      ED Treatments / Results  Labs (all labs ordered are listed, but only abnormal results are displayed) Labs Reviewed  COMPREHENSIVE METABOLIC PANEL - Abnormal; Notable for the following components:      Result Value   Glucose, Bld 185 (*)    Creatinine, Ser 1.11 (*)    GFR calc non Af Amer 43 (*)    GFR calc Af Amer 50 (*)    All other components within normal limits  I-STAT TROPONIN, ED - Abnormal; Notable for the following components:   Troponin i, poc 0.14 (*)    All other components within normal limits  CBC WITH DIFFERENTIAL/PLATELET    EKG EKG Interpretation  Date/Time:  Monday May 13 2018 11:08:54 EST Ventricular Rate:  67 PR Interval:    QRS Duration: 84 QT Interval:  411 QTC Calculation: 434 R Axis:   -76 Text Interpretation:  Sinus rhythm Left anterior fascicular block Anterior infarct, old Minimal ST depression, anterolateral leads Confirmed by Dene Gentry [phone]) on 05/13/2018 11:15:46 AM   Radiology Dg Chest Port 1 View  Result Date: 05/13/2018 CLINICAL DATA:  Central chest pain and shortness of breath. EXAM: PORTABLE CHEST 1 VIEW COMPARISON:  Chest x-ray dated May 31, 2010. FINDINGS: Stable cardiomegaly. Normal pulmonary vascularity. Atherosclerotic calcification of the aortic arch. No focal consolidation, pleural effusion, or pneumothorax. No acute osseous abnormality. Unchanged moderate hiatal hernia.  IMPRESSION: No active disease. Electronically Signed   By: Titus Dubin M.D.   On: 05/13/2018 11:14    Procedures Procedures (including critical care  time)  Medications Ordered in ED Medications  aspirin chewable tablet 324 mg (has no administration in time range)  diltiazem (CARDIZEM) injection 10 mg (10 mg Intravenous Given 05/13/18 1048)     Initial Impression / Assessment and Plan / ED Course  I have reviewed the triage vital signs and the nursing notes.  Pertinent labs & imaging results that were available during my care of the patient were reviewed by me and considered in my medical decision making (see chart for details).        MDM  Screen complete  Patient is presenting for evaluation of reported chest pain.  Patient found to be in A. fib with RVR by EMS.  Patient was given 10 mg of Cardizem IV push (with her consent).   Rate initially dropped into the 120s and then the patient spontaneously converted to normal sinus rhythm.  She reports that she feels significantly better following return to normal sinus rhythm.  Patient's troponin is 0.14.  Case discussed with cardiology and they will be happy to consult.  Patient is fairly adamant that she does not want aggressive or significant interventions.  She specifically declines cardiac catheterization when this provider asked her if she would consider it. She is agreeable to less invasive workup (echo, etc.).   Hospitalist service [facility]) is aware of case and will evaluate for admission.   Final Clinical Impressions(s) / ED Diagnoses   Final diagnoses:  Chest pain, unspecified type  Atrial fibrillation with RVR [facility])    ED Discharge Orders    None       Valarie Merino, MD 05/13/18 1150

## 2018-05-13 NOTE — ED Notes (Signed)
Report to [address] [place]  Nurse for room 24

## 2018-05-13 NOTE — Consult Note (Addendum)
Cardiology Consultation:   Patient ID: Christina Lawson MRN: [id number]; DOB: [birth year]  Admit date: 05/13/2018 Date of Consult: 05/13/2018  Primary Care Provider: Gayland Curry, DO Primary Cardiologist: Dr. Christinia Gully  Patient Profile:   Christina Lawson is a 85 y.o. female with a hx of hypertension, hypothyroidism, tremor, hyperlipidemia, prior TIA, recent syncope who is being seen today for the evaluation of chest discomfort and new onset atrial fibrillation at the request of Dr. Karmen Bongo.  History of Present Illness:   Christina Lawson awoke around 7 AM this morning, went to get a showed. Started noticing chest discomfort and generalized weakness. She rested for a short time but continued to feel poorly. She notified nursing staff at [facility]. Per the notes, EMS was called for chest pain and low blood pressure. Per ER, initial heart rate 130-170 bpm and blood pressure 80s/50s by EMA, though improved to 127/70 on arrival to ER. She has multiple notes documenting that she does not want aggressive care. She received 10 mg IV push diltiazem and converted to normal sinus rhythm. Initial troponin was 0.14 in ER, but this afternoon troponin returned at 3.7. At that time, cardiology was formally consulted.  On my interview, she is feeling much improved, though notes a very mild "1/10" chest discomfort. We discussed atrial fibrillation, elevated troponin, and options for management at length. She has never been told in the past that she has atrial fibrillation, though she does note a history of irregular heartbeat intermittently "for many years." Reports prior TIA. Has not had any more syncope since 03/2018, has been swimming 3 days/week and wearing compression stockings on the days she is not swimming. Has been trying to increase her water intake.   Past Medical History:  Diagnosis Date  . Allergic rhinitis    pollen and grass  . Asthma   . Atrial fibrillation ([place])  05/13/2018  . Blepharitis   . Carpal tunnel syndrome, bilateral 07/03/2016  . Family history of adverse reaction to anesthesia    " [place]   . Hyperlipidemia   . Irregular heart rhythm   . Macular degeneration 2012  . Osteoporosis   . Sensorineural hearing loss, bilateral 2007  . Shingles 2004  . Skin cancer 2005   nose  . Torn meniscus 2006   right knee  . Tremor 2006   familiar.  dx by Dr. Quillian Quince  . Unspecified essential hypertension   . Unspecified hypothyroidism   . Urinary frequency     Past Surgical History:  Procedure Laterality Date  . ABDOMINAL HYSTERECTOMY  1977  . CATARACT EXTRACTION  1974   Left, IOL implants  . CATARACT EXTRACTION Right 1979   IOL implants  . CHOLECYSTECTOMY  1981  . COSMETIC SURGERY  1995   eye  . [place]  . DILATION AND CURETTAGE OF UTERUS  1977  . [place]  . HIATAL HERNIA REPAIR  1977  . SKIN CANCER EXCISION  2005   nose  . THYROIDECTOMY  1955  . TONSILLECTOMY  1931     Home Medications:  Prior to Admission medications   Medication Sig Start Date End Date Taking? Authorizing Provider  albuterol (PROVENTIL HFA;VENTOLIN HFA) 108 (90 Base) MCG/ACT inhaler Inhale 1 puff into the lungs every 6 (six) hours as needed for wheezing.    Yes [provider]  aspirin EC 81 MG tablet Take 81 mg by mouth daily.  Yes [provider]  Azelastine HCl 0.15 % SOLN USE 1 SPRAY NASALLY TWICE A DAY Patient taking differently: Place 1 spray into the nose daily.  12/12/17  Yes Valentina Shaggy, MD  calcium carbonate (TUMS - DOSED IN MG ELEMENTAL CALCIUM) 500 MG chewable tablet Chew 1 tablet by mouth daily.   Yes [provider]  Cholecalciferol (VITAMIN D) 2000 units CAPS Take 1 capsule (2,000 Units total) by mouth daily. 03/24/15  Yes Reed, Tiffany L, DO  Diclofenac Sodium 1 % CREA Place 1 application onto the skin as needed. Patient  taking differently: Place 1 application onto the skin 2 (two) times daily as needed (onto knees).  04/17/18  Yes Reed, Tiffany L, DO  docusate sodium (COLACE) 100 MG capsule Take 200 mg by mouth daily as needed.     Yes [provider]  fluocinonide (LIDEX) 0.05 % external solution Apply 1 application topically 2 (two) times daily as needed (hair).    Yes [provider]  fluticasone furoate-vilanterol (BREO ELLIPTA) 100-25 MCG/INH AEPB Inhale 1 puff into the lungs daily. 05/10/17  Yes Valentina Shaggy, MD  HYDROcodone-acetaminophen [facility]) 5-325 MG tablet Take 1 tablet by mouth 3 (three) times daily as needed for severe pain. 04/17/18  Yes Reed, Tiffany L, DO  loratadine (CLARITIN) 10 MG tablet Take 10 mg by mouth daily.    Yes [provider]  minoxidil (ROGAINE) 2 % external solution Apply topically daily. To regrow hair   Yes [provider]  Multiple Vitamins-Minerals (PRESERVISION AREDS PO) Take 1 tablet by mouth 2 (two) times daily.   Yes [provider]  Saline (SIMPLY SALINE) 0.9 % AERS Place 1 spray into the nose daily.    Yes [provider]  sertraline (ZOLOFT) 25 MG tablet Take 1 tablet (25 mg total) by mouth daily. 04/17/18  Yes Reed, Tiffany L, DO  ALPRAZolam (XANAX) 0.25 MG tablet Take 1 tablet (0.25 mg total) by mouth daily as needed. Patient not taking: Reported on 05/13/2018 03/11/18   Kathrynn Ducking, MD    Inpatient Medications: Scheduled Meds: . aspirin EC  81 mg Oral Daily  . azelastine  1 spray Each Nare BID  . fluticasone furoate-vilanterol  1 puff Inhalation Daily  . [START ON 05/14/2018] sertraline  25 mg Oral Daily  . sodium chloride  1 spray Each Nare BID   Continuous Infusions: . heparin     PRN Meds: acetaminophen, albuterol, ALPRAZolam, HYDROcodone-acetaminophen, loratadine, ondansetron (ZOFRAN) IV  Allergies:    Allergies  Allergen Reactions  . Clindamycin/Lincomycin Diarrhea    diarrhea  . Hemorrhoid  Preparation [Diperodon] Other (See Comments)    Listed on paper work  . Ibuprofen Other (See Comments)    Long time ago/ list on paper work  . Penicillins Other (See Comments)    Did it involve swelling of the face/tongue/throat, SOB, or low BP? Unknown Did it involve sudden or severe rash/hives, skin peeling, or any reaction on the inside of your mouth or nose? Unknown Did you need to seek medical attention at a hospital or doctor's office? Unknown When did it last happen? If all above answers are "NO", may proceed with cephalosporin use.    Social History:   Social History   Socioeconomic History  . Marital status: Single    Spouse name: Not on file  . Number of children: 2  . Years of education: college  . Highest education level: Not on file  Occupational History  . 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 Financial resource strain: Not hard at all  . Food insecurity:    Worry: Never true    Inability: Never true  . Transportation needs:    Medical: No    Non-medical: No  Tobacco Use  . Smoking status: Never Smoker  . Smokeless tobacco: Never Used  Substance and Sexual Activity  . Alcohol use: Yes    Comment: on occ.  . Drug use: No  . Sexual activity: Not Currently  Lifestyle  . Physical activity:    Days per week: 3 days    Minutes per session: 40 min  . Stress: Only a little  Relationships  . Social connections:    Talks on phone: More than three times a week    Gets together: More than three times a week    Attends religious service: Never    Active member of club or organization: No    Attends meetings of clubs or organizations: Never    Relationship status: Widowed  . Intimate partner violence:    Fear of current or ex partner: No    Emotionally abused: No    Physically abused: No    Forced sexual activity: No  Other Topics Concern  . Not on file  Social History Narrative   Lives at [place].  Married 1958.   Never smoked   Alcohol minimal   Exercise predominantly walking, water aerobic   Retired Education officer, museum       Family History:    Family History  Problem Relation Age of Onset  . Emphysema Father   . Allergies Father   . Asthma Father   . Brain cancer Mother   . Cancer Mother        Brain  . Hypertension Mother   . Cancer Sister        Cervical  . Endometrial cancer Sister   . Breast cancer Sister 81  . Cancer Sister        Breast, metastasized to bone  . Breast cancer Sister        Cher Nakai of age  . Breast cancer Daughter 28  . Heart disease Maternal Grandfather   . Stroke Other   . Breast cancer Maternal Aunt   . Breast cancer Paternal Aunt   . Breast cancer Cousin   . Breast cancer Maternal Aunt   . Breast cancer Cousin      ROS:  Please see the history of present illness.  Review of Systems  Constitutional: Negative for chills, fever and malaise/fatigue.  HENT: Negative for congestion and sore throat.   Eyes: Negative for double vision and pain.  Respiratory: Negative for cough, hemoptysis and shortness of breath.   Cardiovascular: Positive for chest pain and palpitations. Negative for orthopnea, claudication, leg swelling and PND.  Gastrointestinal: Negative for abdominal pain, blood in stool and melena.  Genitourinary: Negative for dysuria and hematuria.  Musculoskeletal: Negative for falls and myalgias.  Neurological: Negative for focal weakness and headaches.  Endo/Heme/Allergies: Does not bruise/bleed easily.   All other ROS reviewed and negative.     Physical Exam/Data:   Vitals:   05/13/18 1315 05/13/18 1359 05/13/18 1444 05/13/18 1728  BP: 123/71 130/75 137/74 (!) 151/63  Pulse: 73 77 70 70  Resp:  16 19   Temp:  98.4 F (36.9 C) (!) 97.2 F (36.2 C)   TempSrc:  Oral Oral   SpO2: 97% 98%  98% 100%  Weight:      Height:       No intake or output data in the 24 hours ending 05/13/18 1805 Last 3 Weights 05/13/2018 04/17/2018  04/13/2018  Weight (lbs) 130 lb 129 lb 130 lb  Weight (kg) 58.968 kg 58.514 kg 58.968 kg     Body mass index is 25.39 kg/m.  General:  Well nourished, well developed, in no acute distress HEENT: normal Lymph: no adenopathy Neck: no JVD Endocrine:  No thryomegaly Vascular: No carotid bruits; RA pulses 2+ bilaterally Cardiac:  normal S1, S2; RRR; 1/6 SEM with preserved S2. Lungs:  clear to auscultation bilaterally, no wheezing, rhonchi or rales  Abd: soft, nontender, no hepatomegaly  Ext: no edema Musculoskeletal:  No deformities, resting essential tremor Skin: warm and dry  Neuro:  no focal abnormalities noted Psych:  Normal affect   EKG:  The EKG was personally reviewed and demonstrates:  Most recent NSR with ST depressions leades I, II, V4-V6 different from prior in 03/2018. Initial ECG atrial fibrillation with RVR Telemetry:  Telemetry was personally reviewed and demonstrates:  Now in normal sinus rhythm, did have brief NSVT on monitor  Relevant CV Studies: Pending echo  Laboratory Data:  Chemistry Recent Labs  Lab 05/13/18 1042  NA 140  K 3.8  CL 105  CO2 22  GLUCOSE 185*  BUN 15  CREATININE 1.11*  CALCIUM 9.9  GFRNONAA 43*  GFRAA 50*  ANIONGAP 13    Recent Labs  Lab 05/13/18 1042  PROT 7.3  ALBUMIN 4.0  AST 28  ALT 20  ALKPHOS 58  BILITOT 1.2   Hematology Recent Labs  Lab 05/13/18 1042  WBC 8.7  RBC 4.61  HGB 14.6  HCT 45.1  MCV 97.8  MCH 31.7  MCHC 32.4  RDW 13.2  PLT 246   Cardiac Enzymes Recent Labs  Lab 05/13/18 1544  TROPONINI 3.70*    Recent Labs  Lab 05/13/18 1049  TROPIPOC 0.14*    BNPNo results for input(s): BNP, PROBNP in the last 168 hours.  DDimer No results for input(s): DDIMER in the last 168 hours.  Radiology/Studies:  Dg Chest Port 1 View  Result Date: 05/13/2018 CLINICAL DATA:  Central chest pain and shortness of breath. EXAM: PORTABLE CHEST 1 VIEW COMPARISON:  Chest x-ray dated May 31, 2010. FINDINGS: Stable  cardiomegaly. Normal pulmonary vascularity. Atherosclerotic calcification of the aortic arch. No focal consolidation, pleural effusion, or pneumothorax. No acute osseous abnormality. Unchanged moderate hiatal hernia. IMPRESSION: No active disease. Electronically Signed   By: Titus Dubin M.D.   On: 05/13/2018 11:14    Assessment and Plan:   New onset afib: chadsvasc=6. Discussed anticoagulation pros/cons at length. Starting on heparin drip per primary team. Denies any active bleeding. She is willing to consider anticoagulation. -discussed rate control strategy for paroxysmal atrial fib. She will debate. Complex given recent syncope of unclear etiology.  Chest pressure, elevated troponin: possibly demand ischemia from afib, but given ECG ST depressions, chest pressure, rising demand, concern this may be primary ACS event c/w NSTEMI -willing to pursue echocardiogram, ordered -not interested in cath at this time, will plan for medical management -would trend troponins until downtrending -primary team has already started heparin. Continue home aspirin (will need to evaluate if anticoagulation started for the long term).  -would start atorvastatin, consider low dose beta blocker as well  Patient and her daughter would like to leave ASAP tomorrow. Discussed that echo is not always able to be completed  first thing in the AM, and if there are med changes, etc may need to wait until later in the day. We will review the echo images together once completed.  Confirmed DNR, no aggressive measures  For questions or updates, please contact [place] Please consult [email or web address] for contact info under   Signed, Buford Dresser, MD  05/13/2018 6:05 PM

## 2018-05-13 NOTE — ED Notes (Signed)
Lab results given to EDP. 

## 2018-05-13 NOTE — ED Notes (Addendum)
ED TO INPATIENT HANDOFF REPORT  ED Nurse Name and Phone #:  Luellen Pucker  [phone]  S Name/Age/Gender Christina Lawson 85 y.o. female Room/Bed: 021C/021C  Code Status   Code Status: DNR  Home/SNF/Other Nursing Home-[facility] (Independent living)  Patient oriented to: self, place, time and situation Is this baseline? Yes   Triage Complete: Triage complete  Chief Complaint afib  Triage Note Arrived via EMS from [place]. Patient alert answering and following commands appropriate. Has DNR and MOST form declined treatment except pain medication to EMS. Patient heart rate 130-170 and initial BP 80/50's Last BP 127 /70.   Allergies Allergies  Allergen Reactions  . Clindamycin/Lincomycin     diarrhea  . Penicillins Other (See Comments)    Did it involve swelling of the face/tongue/throat, SOB, or low BP? Unknown Did it involve sudden or severe rash/hives, skin peeling, or any reaction on the inside of your mouth or nose? Unknown Did you need to seek medical attention at a hospital or doctor's office? Unknown When did it last happen? If all above answers are "NO", may proceed with cephalosporin use.    Level of Care/Admitting Diagnosis ED Disposition    ED Disposition Condition [facility] Area: [place] [[id number]]  Level of Care: Cardiac Telemetry [103]  I expect the patient will be discharged within 24 hours: Yes  LOW acuity---Tx typically complete <24 hrs---ACUTE conditions typically can be evaluated <24 hours---LABS likely to return to acceptable levels <24 hours---IS near functional baseline---EXPECTED to return to current living arrangement---NOT newly hypoxic: Meets criteria for 5C-Observation unit  Diagnosis: Atrial fibrillation ([place]) [427.31.ICD-9-CM]  Admitting Physician: Karmen Bongo [[id number]]  Attending Physician: Karmen Bongo [[id number]]  PT Class (Do Not Modify): Observation [104]  PT Acc Code (Do Not Modify):  Observation [10022]       B Medical/Surgery History Past Medical History:  Diagnosis Date  . Allergic rhinitis    pollen and grass  . Asthma   . Blepharitis   . Carpal tunnel syndrome, bilateral 07/03/2016  . Hyperlipidemia   . Irregular heart rhythm   . Macular degeneration 2012  . Osteoporosis   . Sensorineural hearing loss, bilateral 2007  . Shingles 2004  . Skin cancer 2005   nose  . Torn meniscus 2006   right knee  . Tremor 2006   familiar.  dx by Dr. Quillian Quince  . Unspecified essential hypertension   . Unspecified hypothyroidism   . Urinary frequency    Past Surgical History:  Procedure Laterality Date  . ABDOMINAL HYSTERECTOMY  1977  . CATARACT EXTRACTION  1974   Left, IOL implants  . CATARACT EXTRACTION Right 1979   IOL implants  . CHOLECYSTECTOMY  1981  . COSMETIC SURGERY  1995   eye  . [place]  . DILATION AND CURETTAGE OF UTERUS  1977  . [place]  . HIATAL HERNIA REPAIR  1977  . SKIN CANCER EXCISION  2005   nose  . THYROIDECTOMY  1955  . TONSILLECTOMY  1931     A IV Location/Drains/Wounds Patient Lines/Drains/Airways Status   Active Line/Drains/Airways    Name:   Placement date:   Placement time:   Site:   Days:   Peripheral IV 05/13/18 Left Antecubital   05/13/18    1042    Antecubital   less than 1          Intake/Output Last 24 hours No intake or output data in the 24  hours ending 05/13/18 1252  Labs/Imaging Results for orders placed or performed during the hospital encounter of 05/13/18 (from the past 48 hour(s))  Comprehensive metabolic panel     Status: Abnormal   Collection Time: 05/13/18 10:42 AM  Result Value Ref Range   Sodium 140 135 - 145 mmol/L   Potassium 3.8 3.5 - 5.1 mmol/L   Chloride 105 98 - 111 mmol/L   CO2 22 22 - 32 mmol/L   Glucose, Bld 185 (H) 70 - 99 mg/dL   BUN 15 8 - 23 mg/dL   Creatinine, Ser 1.11 (H) 0.44 - 1.00 mg/dL   Calcium 9.9 8.9 - 10.3 mg/dL   Total Protein 7.3 6.5  - 8.1 g/dL   Albumin 4.0 3.5 - 5.0 g/dL   AST 28 15 - 41 U/L   ALT 20 0 - 44 U/L   Alkaline Phosphatase 58 38 - 126 U/L   Total Bilirubin 1.2 0.3 - 1.2 mg/dL   GFR calc non Af Amer 43 (L) >60 mL/min   GFR calc Af Amer 50 (L) >60 mL/min   Anion gap 13 5 - 15    Comment: Performed at [facility], [address] [place], [place] [address]  CBC with Differential     Status: None   Collection Time: 05/13/18 10:42 AM  Result Value Ref Range   WBC 8.7 4.0 - 10.5 K/uL   RBC 4.61 3.87 - 5.11 MIL/uL   Hemoglobin 14.6 12.0 - 15.0 g/dL   HCT 45.1 36.0 - 46.0 %   MCV 97.8 80.0 - 100.0 fL   MCH 31.7 26.0 - 34.0 pg   MCHC 32.4 30.0 - 36.0 g/dL   RDW 13.2 11.5 - 15.5 %   Platelets 246 150 - 400 K/uL   nRBC 0.0 0.0 - 0.2 %   Neutrophils Relative % 77 %   Neutro Abs 6.8 1.7 - 7.7 K/uL   Lymphocytes Relative 15 %   Lymphs Abs 1.3 0.7 - 4.0 K/uL   Monocytes Relative 6 %   Monocytes Absolute 0.5 0.1 - 1.0 K/uL   Eosinophils Relative 1 %   Eosinophils Absolute 0.0 0.0 - 0.5 K/uL   Basophils Relative 0 %   Basophils Absolute 0.0 0.0 - 0.1 K/uL   Immature Granulocytes 1 %   Abs Immature Granulocytes 0.05 0.00 - 0.07 K/uL    Comment: Performed at [facility], [address] [place] [address] [place], [place] [address]  I-Stat Troponin, ED (not at [facility])     Status: Abnormal   Collection Time: 05/13/18 10:49 AM  Result Value Ref Range   Troponin i, poc 0.14 (HH) 0.00 - 0.08 ng/mL   Comment NOTIFIED PHYSICIAN    Comment 3            Comment: Due to the release kinetics of cTnI, a negative result within the first hours of the onset of symptoms does not rule out myocardial infarction with certainty. If myocardial infarction is still suspected, repeat the test at appropriate intervals.    Dg Chest Port 1 View  Result Date: 05/13/2018 CLINICAL DATA:  Central chest pain and shortness of breath. EXAM: PORTABLE CHEST 1 VIEW COMPARISON:  Chest x-ray dated May 31, 2010. FINDINGS: Stable cardiomegaly.  Normal pulmonary vascularity. Atherosclerotic calcification of the aortic arch. No focal consolidation, pleural effusion, or pneumothorax. No acute osseous abnormality. Unchanged moderate hiatal hernia. IMPRESSION: No active disease. Electronically Signed   By: Titus Dubin M.D.   On: 05/13/2018 11:14  Pending Labs Unresulted Labs (From admission, onward)    Start     Ordered   05/14/18 0981  Basic metabolic panel  Tomorrow morning,   R     05/13/18 1251   05/14/18 0500  Lipid panel  Tomorrow morning,   R     05/13/18 1251   05/14/18 0500  CBC  Tomorrow morning,   R     05/13/18 1251          Vitals/Pain Today's Vitals   05/13/18 1130 05/13/18 1213 05/13/18 1215 05/13/18 1230  BP: 116/68 135/72  120/67  Pulse: 73 84  78  Resp:  (!) 22 18   SpO2: 99% 99%  99%  Weight:      Height:      PainSc:        Isolation Precautions No active isolations  Medications Medications  aspirin EC tablet 81 mg (has no administration in time range)  HYDROcodone-acetaminophen (NORCO/VICODIN) 5-325 MG per tablet 1 tablet (has no administration in time range)  ALPRAZolam (XANAX) tablet 0.25 mg (has no administration in time range)  sertraline (ZOLOFT) tablet 25 mg (has no administration in time range)  albuterol (PROVENTIL HFA;VENTOLIN HFA) 108 (90 Base) MCG/ACT inhaler 2 puff (has no administration in time range)  Azelastine HCl 0.15 % SOLN 1 spray (has no administration in time range)  fluticasone furoate-vilanterol (BREO ELLIPTA) 100-25 MCG/INH 1 puff (has no administration in time range)  loratadine (CLARITIN) tablet 10 mg (has no administration in time range)  Saline 0.9 % AERS 1 spray (has no administration in time range)  acetaminophen (TYLENOL) tablet 650 mg (has no administration in time range)  ondansetron (ZOFRAN) injection 4 mg (has no administration in time range)  enoxaparin (LOVENOX) injection 30 mg (has no administration in time range)  metoprolol tartrate (LOPRESSOR)  tablet 12.5 mg (has no administration in time range)  diltiazem (CARDIZEM) injection 10 mg (10 mg Intravenous Given 05/13/18 1048)  aspirin chewable tablet 324 mg (324 mg Oral Given 05/13/18 1152)    Mobility walks Low fall risk   Focused Assessments Cardiac Assessment Handoff:  Cardiac Rhythm: Normal sinus rhythm No results found for: CKTOTAL, CKMB, CKMBINDEX, TROPONINI No results found for: DDIMER Does the Patient currently have chest pain? No      R Recommendations: See Admitting Provider Note  Report given to:   Additional Notes:    Pt has converted to NSR-70's

## 2018-05-13 NOTE — ED Triage Notes (Signed)
Arrived via EMS from [place]. Patient alert answering and following commands appropriate. Has DNR and MOST form declined treatment except pain medication to EMS. Patient heart rate 130-170 and initial BP 80/50's Last BP 127 /70.

## 2018-05-13 NOTE — Progress Notes (Signed)
ANTICOAGULATION CONSULT NOTE - Initial Consult  Pharmacy Consult for heparin Indication: chest pain/ACS  Allergies  Allergen Reactions  . Clindamycin/Lincomycin Diarrhea    diarrhea  . Hemorrhoid Preparation [Diperodon] Other (See Comments)    Listed on paper work  . Ibuprofen Other (See Comments)    Long time ago/ list on paper work  . Penicillins Other (See Comments)    Did it involve swelling of the face/tongue/throat, SOB, or low BP? Unknown Did it involve sudden or severe rash/hives, skin peeling, or any reaction on the inside of your mouth or nose? Unknown Did you need to seek medical attention at a hospital or doctor's office? Unknown When did it last happen? If all above answers are "NO", may proceed with cephalosporin use.    Patient Measurements: Height: 5' (152.4 cm) Weight: 130 lb (59 kg) IBW/kg (Calculated) : 45.5 Heparin Dosing Weight: 57.5 kg  Vital Signs: Temp: 97.2 F (36.2 C) (03/02 1444) Temp Source: Oral (03/02 1444) BP: 151/63 (03/02 1728) Pulse Rate: 70 (03/02 1728)  Labs: Recent Labs    05/13/18 1042 05/13/18 1544  HGB 14.6  --   HCT 45.1  --   PLT 246  --   CREATININE 1.11*  --   TROPONINI  --  3.70*    Estimated Creatinine Clearance: 26 mL/min (A) (by C-G formula based on SCr of 1.11 mg/dL (H)).   Medical History: Past Medical History:  Diagnosis Date  . Allergic rhinitis    pollen and grass  . Asthma   . Atrial fibrillation ([place]) 05/13/2018  . Blepharitis   . Carpal tunnel syndrome, bilateral 07/03/2016  . Family history of adverse reaction to anesthesia    " [place]   . Hyperlipidemia   . Irregular heart rhythm   . Macular degeneration 2012  . Osteoporosis   . Sensorineural hearing loss, bilateral 2007  . Shingles 2004  . Skin cancer 2005   nose  . Torn meniscus 2006   right knee  . Tremor 2006   familiar.  dx by Dr. Quillian Quince  . Unspecified essential hypertension    . Unspecified hypothyroidism   . Urinary frequency     Medications:  Scheduled:  . aspirin EC  81 mg Oral Daily  . azelastine  1 spray Each Nare BID  . enoxaparin (LOVENOX) injection  30 mg Subcutaneous Q24H  . fluticasone furoate-vilanterol  1 puff Inhalation Daily  . [START ON 05/14/2018] sertraline  25 mg Oral Daily  . sodium chloride  1 spray Each Nare BID    Assessment: 63 yof presenting with chest pain, found to be in Afib by EMS. No anticoag PTA. Receiving DVT ppx dose of enoxaparin 30 mg today at 1539.  Hgb 14.6, plt 246. Troponin increased from 0.14>3.7. Scr 1.11 (CrCl 26 mL/min). Given recent enoxaparin dose (~0.5 mg/kg), will start heparin infusion without bolus.   Goal of Therapy:  Heparin level 0.3-0.7 units/ml Monitor platelets by anticoagulation protocol: Yes   Plan:  Start heparin infusion at 700 units/hr Check anti-Xa level in 8 hours and daily while on heparin Continue to monitor H&H and platelets  Antonietta Jewel, PharmD, BCCCP Clinical Pharmacist  Pager: [phone] Phone: 2-5 05/13/2018,5:34 PM

## 2018-05-13 NOTE — Telephone Encounter (Signed)
Mliss Sax calling stating that pt is having chest pain and low blood pressure and she has called EMS and would like an order for the EMS call.

## 2018-05-13 NOTE — Progress Notes (Signed)
Cardiology notified of elevation in troponin. No new orders given.

## 2018-05-13 NOTE — Progress Notes (Signed)
CRITICAL VALUE ALERT  Critical Value:  Troponin 3.70 Date & Time Notied:  1720  Provider Notified:Dr. Lorin Mercy  Orders Received/Actions taken: awaiting MD to call back

## 2018-05-13 NOTE — H&P (Signed)
History and Physical    Christina Lawson:[id number] DOB: 1923-06-16 DOA: 05/13/2018  PCP: Christina Curry, DO Consultants:  Margaretann Loveless - cardiology; Jannifer Franklin - neurology; Ernst Bowler - allergies Patient coming from:  [place]; NOK: Daughter, Christina Lawson, [phone]  Chief Complaint: Chest pain  HPI: Christina Lawson is a 85 y.o. female with medical history significant of hypothyroidism; HTN; essential tremor; HLD; and macular degeneration presenting with chest pain. This AM, she awoke about 0700.  She was up in the night for about 2 hours, which is unusual.  No pain then, just not sleeping.  Upon awakening, she was ok but she got out of the shower and started feeling weak and noticed chest pressure.  She sat on the floor for a while and eventually made her way to bed.  Then, she called the nurse - who gave her her Albuterol.  +SOB, but this has resolved.  Her chest pressure has resolved, but she is uncertain what made that happen.   ED Course:   Chest pain, no palpitations.  Afib into 160s by EMS.  Given Cardizem 10 mg IV x 1, reverted to NSR.  Troponin 0.14.  Feels better.  DNR, has MOST form.  Cardiology consulted - ok for obs, echo, declines cath.  Review of Systems: As per HPI; otherwise review of systems reviewed and negative.   Ambulatory Status:  Ambulates without assistance  Past Medical History:  Diagnosis Date  . Allergic rhinitis    pollen and grass  . Asthma   . Blepharitis   . Carpal tunnel syndrome, bilateral 07/03/2016  . Hyperlipidemia   . Irregular heart rhythm   . Macular degeneration 2012  . Osteoporosis   . Sensorineural hearing loss, bilateral 2007  . Shingles 2004  . Skin cancer 2005   nose  . Torn meniscus 2006   right knee  . Tremor 2006   familiar.  dx by Dr. Quillian Lawson  . Unspecified essential hypertension   . Unspecified hypothyroidism   . Urinary frequency     Past Surgical History:  Procedure Laterality Date  . ABDOMINAL  HYSTERECTOMY  1977  . CATARACT EXTRACTION  1974   Left, IOL implants  . CATARACT EXTRACTION Right 1979   IOL implants  . CHOLECYSTECTOMY  1981  . COSMETIC SURGERY  1995   eye  . [address]  . DILATION AND CURETTAGE OF UTERUS  1977  . [place]  . HIATAL HERNIA REPAIR  1977  . SKIN CANCER EXCISION  2005   nose  . THYROIDECTOMY  1955  . TONSILLECTOMY  1931    Social History   Socioeconomic History  . Marital status: Single    Spouse name: Not on file  . Number of children: 2  . Years of education: college  . Highest education level: Not on file  Occupational History  . Occupation: retired Education officer, museum    Comment: retired  Scientific laboratory technician  . Financial resource strain: Not hard at all  . Food insecurity:    Worry: Never true    Inability: Never true  . Transportation needs:    Medical: No    Non-medical: No  Tobacco Use  . Smoking status: Never Smoker  . Smokeless tobacco: Never Used  Substance and Sexual Activity  . Alcohol use: Yes    Comment: on occ.  . Drug use: No  . Sexual activity: Never  Lifestyle  . Physical activity:    Days per week: 3 days  Minutes per session: 40 min  . Stress: Only a little  Relationships  . Social connections:    Talks on phone: More than three times a week    Gets together: More than three times a week    Attends religious service: Never    Active member of club or organization: No    Attends meetings of clubs or organizations: Never    Relationship status: Widowed  . Intimate partner violence:    Fear of current or ex partner: No    Emotionally abused: No    Physically abused: No    Forced sexual activity: No  Other Topics Concern  . Not on file  Social History Narrative   Lives at [place]. Married 1958.   Never smoked   Alcohol minimal   Exercise predominantly walking, water aerobic   Retired Education officer, museum       Allergies  Allergen Reactions  .  Clindamycin/Lincomycin     diarrhea  . Penicillins Other (See Comments)    Did it involve swelling of the face/tongue/throat, SOB, or Lawson BP? Unknown Did it involve sudden or severe rash/hives, skin peeling, or any reaction on the inside of your mouth or nose? Unknown Did you need to seek medical attention at a hospital or doctor's office? Unknown When did it last happen? If all above answers are "NO", may proceed with cephalosporin use.    Family History  Problem Relation Age of Onset  . Emphysema Father   . Allergies Father   . Asthma Father   . Brain cancer Mother   . Cancer Mother        Brain  . Hypertension Mother   . Cancer Sister        Cervical  . Endometrial cancer Sister   . Breast cancer Sister 68  . Cancer Sister        Breast, metastasized to bone  . Breast cancer Sister        Cher Nakai of age  . Breast cancer Daughter 45  . Heart disease Maternal Grandfather   . Stroke Other   . Breast cancer Maternal Aunt   . Breast cancer Paternal Aunt   . Breast cancer Cousin   . Breast cancer Maternal Aunt   . Breast cancer Cousin     Prior to Admission medications   Medication Sig Start Date End Date Taking? Authorizing Provider  albuterol (PROVENTIL HFA;VENTOLIN HFA) 108 (90 Base) MCG/ACT inhaler Inhale 2 puffs into the lungs as directed.    [provider]  ALPRAZolam Duanne Moron) 0.25 MG tablet Take 1 tablet (0.25 mg total) by mouth daily as needed. 03/11/18   Kathrynn Ducking, MD  aspirin EC 81 MG tablet Take 81 mg by mouth daily.    [provider]  Azelastine HCl 0.15 % SOLN USE 1 SPRAY NASALLY TWICE A DAY 12/12/17   Valentina Shaggy, MD  calcium carbonate (TUMS - DOSED IN MG ELEMENTAL CALCIUM) 500 MG chewable tablet Chew 1 tablet by mouth daily.    [provider]  Cholecalciferol (VITAMIN D) 2000 units CAPS Take 1 capsule (2,000 Units total) by mouth daily. 03/24/15   Reed, Tiffany L, DO  Diclofenac Sodium 1 % CREA Place 1  application onto the skin as needed. 04/17/18   Reed, Tiffany L, DO  docusate sodium (COLACE) 100 MG capsule Take 200 mg by mouth daily as needed.      [provider]  fluticasone furoate-vilanterol (BREO ELLIPTA) 100-25  MCG/INH AEPB Inhale 1 puff into the lungs daily. 05/10/17   Valentina Shaggy, MD  HYDROcodone-acetaminophen [facility]) 5-325 MG tablet Take 1 tablet by mouth 3 (three) times daily as needed for severe pain. 04/17/18   Reed, Tiffany L, DO  loratadine (CLARITIN) 10 MG tablet Take 10 mg by mouth daily. As needed    [provider]  minoxidil (ROGAINE) 2 % external solution Apply topically 2 (two) times daily.      [provider]  Saline (SIMPLY SALINE) 0.9 % AERS 1 spray by Nasal route 2 (two) times daily.      [provider]  sertraline (ZOLOFT) 25 MG tablet Take 1 tablet (25 mg total) by mouth daily. 04/17/18   Christina Curry, DO    Physical Exam: Vitals:   05/13/18 1130 05/13/18 1213 05/13/18 1215 05/13/18 1230  BP: 116/68 135/72  120/67  Pulse: 73 84  78  Resp:  (!) 22 18   SpO2: 99% 99%  99%  Weight:      Height:         . General:  Appears calm and comfortable and is NAD; she appears mildly SOB, which she reports is at baseline for her . Eyes:  PERRL, EOMI, normal lids, iris . ENT:  grossly normal hearing, lips & tongue, mmm . Neck:  no LAD, masses or thyromegaly . Cardiovascular:  RRR, no m/r/g. No LE edema.  Marland Kitchen Respiratory:   CTA bilaterally with no wheezes/rales/rhonchi.  Mildly increased respiratory effort. . Abdomen:  soft, NT, ND, NABS . Skin:  no rash or induration seen on limited exam . Musculoskeletal:  grossly normal tone BUE/BLE, good ROM, no bony abnormality . Psychiatric:  grossly normal mood and affect, speech fluent and appropriate, AOx3 . Neurologic:  CN 2-12 grossly intact, moves all extremities in coordinated fashion, sensation intact    Radiological Exams on Admission: Dg Chest Port 1 View  Result Date:  05/13/2018 CLINICAL DATA:  Central chest pain and shortness of breath. EXAM: PORTABLE CHEST 1 VIEW COMPARISON:  Chest x-ray dated May 31, 2010. FINDINGS: Stable cardiomegaly. Normal pulmonary vascularity. Atherosclerotic calcification of the aortic arch. No focal consolidation, pleural effusion, or pneumothorax. No acute osseous abnormality. Unchanged moderate hiatal hernia. IMPRESSION: No active disease. Electronically Signed   By: Titus Dubin M.D.   On: 05/13/2018 11:14    EKG: Independently reviewed.   1035 - Afib with rate 153; LAFB; nonspecific ST changes which are likely rate-related 1105 - Afib with rate 118; LAFB; nonspecific ST changes which are likely rate-related 1108 - NSR with rate 67; nonspecific ST changes    Labs on Admission: I have personally reviewed the available labs and imaging studies at the time of the admission.  Pertinent labs:   Glucose 185 BUN 15/Creatinine 1.11/GFR 43 Troponin 0.14 Normal CBC on 3/2   Assessment/Plan Principal Problem:   Atrial fibrillation (HCC) Active Problems:   Dyspnea   Essential hypertension   Hyperglycemia   Anxiety   Acquired hypothyroidism   Atrial fibrillation, new  -Patient presenting with new-onset afib.  -This was symptomatic with chest pain although she did not have the sensation of palpitations -She was given Cardizem 10 mg IV and reverted to NSR -Troponin was elevated, which is likely due to demand ischemia in the setting of RVR rather than ACS; will trend q6h x 3 -After discussion of options with the patient, she is very clear about what she does and does not desire for evaluation.  At this  time, she declines interventional evaluation altogether.  Since the afib has current reverted, she also requests NO cardiology consultation and no Echocardiogram. -Will observe on telemetry overnight with plan for d/c tomorrow AM if afib does not recur; she is willing to do outpatient cardiology f/u with Dr. Margaretann Loveless. -Will  continue ASA 81 mg PO daily.   -Treat chest pain with NTG prn.   -Repeat EKG in AM  -Heart rate is currently well controlled. -She declines oral anticoagulation at this time due to fall risk. -There was discussion about addition of a beta blocker during her last appointment with Dr. Margaretann Loveless; this could be considered in case afib recurs in the future.   -Given lack of current symptoms, will defer this discussion to outpatient cardiology f/u unless afib recurs overnight.  Dyspnea -Patient appeared SOB during our evaluation, and also mildly anxious. -However, she assured me that she was at her baseline. -She does take a number of medications for asthma and allergies and she has not taken her medications this AM. -Will resume home medications and continue to follow.  HTN -She does not appear to be taking any medications for this issue at this time.   -As noted above, Cardizem or BB therapy could be considered.  HLD -No medications at this time.  Anxiety -Continue Zoloft and prn Xanax.  Hypothyroidism -Normal testing in 7/18. -She does not appear to be taking medication for this condition.   -Recheck TSH and free T4.  DVT prophylaxis: Lovenox Code Status: DNR - confirmed with patient Family Communication: None present Disposition Plan:  Home once clinically improved Consults called: None - cardiology consult cancelled by patient  Admission status: It is my clinical opinion that referral for OBSERVATION is reasonable and necessary in this patient based on the above information provided. The aforementioned taken together are felt to place the patient at high risk for further clinical deterioration. However it is anticipated that the patient may be medically stable for discharge from the hospital within 24 to 48 hours.     Karmen Bongo MD [facility]   How to contact the [facility] Attending or Consulting provider [place] or covering provider during after hours [place], for this  patient?  1. Check the care team in [facility] and look for a) attending/consulting [facility] provider listed and b) the [facility] team listed 2. Log into [email or web address] and use [place]'s universal password to access. If you do not have the password, please contact the hospital operator. 3. Locate the [facility] provider you are looking for under [facility] and page to a number that you can be directly reached. 4. If you still have difficulty reaching the provider, please page the [facility] (Director on Call) for the Hospitalists listed on amion for assistance.   05/13/2018, 1:01 PM

## 2018-05-13 NOTE — Telephone Encounter (Signed)
Ok, I see patient was already seen in the ED.

## 2018-05-13 NOTE — Progress Notes (Signed)
Notified by the nurse about the patient's elevated troponin.  I called and spoke with the patient.  Based on this conversation, she is in agreement with:  1- starting heparin infusion 2- ordering echo 3- calling cardiology to consult on her care  She prefers to go home tomorrow, if possible.   Carlyon Shadow, M.D.

## 2018-05-13 NOTE — ED Notes (Signed)
Pt given water 

## 2018-05-14 ENCOUNTER — Observation Stay (HOSPITAL_BASED_OUTPATIENT_CLINIC_OR_DEPARTMENT_OTHER): Payer: Medicare Other

## 2018-05-14 DIAGNOSIS — I340 Nonrheumatic mitral (valve) insufficiency: Secondary | ICD-10-CM | POA: Diagnosis not present

## 2018-05-14 DIAGNOSIS — I4891 Unspecified atrial fibrillation: Secondary | ICD-10-CM

## 2018-05-14 DIAGNOSIS — R079 Chest pain, unspecified: Secondary | ICD-10-CM | POA: Diagnosis not present

## 2018-05-14 DIAGNOSIS — I248 Other forms of acute ischemic heart disease: Secondary | ICD-10-CM | POA: Diagnosis not present

## 2018-05-14 DIAGNOSIS — F419 Anxiety disorder, unspecified: Secondary | ICD-10-CM

## 2018-05-14 DIAGNOSIS — E039 Hypothyroidism, unspecified: Secondary | ICD-10-CM

## 2018-05-14 DIAGNOSIS — I10 Essential (primary) hypertension: Secondary | ICD-10-CM

## 2018-05-14 DIAGNOSIS — I480 Paroxysmal atrial fibrillation: Secondary | ICD-10-CM | POA: Diagnosis not present

## 2018-05-14 LAB — BASIC METABOLIC PANEL
Anion gap: 12 (ref 5–15)
BUN: 19 mg/dL (ref 8–23)
CHLORIDE: 108 mmol/L (ref 98–111)
CO2: 22 mmol/L (ref 22–32)
Calcium: 9 mg/dL (ref 8.9–10.3)
Creatinine, Ser: 0.9 mg/dL (ref 0.44–1.00)
GFR calc Af Amer: >60 mL/min (ref >60)
GFR calc non Af Amer: 56 mL/min — ABNORMAL LOW (ref >60)
GLUCOSE: 97 mg/dL (ref 70–99)
Potassium: 3.4 mmol/L — ABNORMAL LOW (ref 3.5–5.1)
Sodium: 142 mmol/L (ref 135–145)

## 2018-05-14 LAB — LIPID PANEL
Cholesterol: 211 mg/dL — ABNORMAL HIGH (ref 0–200)
HDL: 53 mg/dL (ref >40)
LDL Cholesterol: 146 mg/dL — ABNORMAL HIGH (ref 0–99)
Total CHOL/HDL Ratio: 4 RATIO
Triglycerides: 61 mg/dL (ref <150)
VLDL: 12 mg/dL (ref 0–40)

## 2018-05-14 LAB — HEPARIN LEVEL (UNFRACTIONATED)
Heparin Unfractionated: 0.45 IU/mL (ref 0.30–0.70)
Heparin Unfractionated: 0.62 IU/mL (ref 0.30–0.70)

## 2018-05-14 LAB — CBC
HCT: 36.4 % (ref 36.0–46.0)
Hemoglobin: 12.2 g/dL (ref 12.0–15.0)
MCH: 31.5 pg (ref 26.0–34.0)
MCHC: 33.5 g/dL (ref 30.0–36.0)
MCV: 94.1 fL (ref 80.0–100.0)
Platelets: 211 10*3/uL (ref 150–400)
RBC: 3.87 MIL/uL (ref 3.87–5.11)
RDW: 13.3 % (ref 11.5–15.5)
WBC: 7.9 10*3/uL (ref 4.0–10.5)
nRBC: 0 % (ref 0.0–0.2)

## 2018-05-14 LAB — ECHOCARDIOGRAM COMPLETE
Height: 60 in
Weight: 1987.2 oz

## 2018-05-14 LAB — TROPONIN I: Troponin I: 6.2 ng/mL (ref <0.03)

## 2018-05-14 MED ORDER — POTASSIUM CHLORIDE CRYS ER 20 MEQ PO TBCR
40.0000 meq | EXTENDED_RELEASE_TABLET | Freq: Once | ORAL | Status: AC
  Administered 2018-05-14: 40 meq via ORAL
  Filled 2018-05-14: qty 2

## 2018-05-14 MED ORDER — APIXABAN 2.5 MG PO TABS: 2.5000 mg | ORAL_TABLET | Freq: Two times a day (BID) | ORAL | 0 refills | Status: DC

## 2018-05-14 MED ORDER — METOPROLOL SUCCINATE ER 25 MG PO TB24
12.5000 mg | ORAL_TABLET | Freq: Every day | ORAL | Status: DC
  Administered 2018-05-14: 12.5 mg via ORAL
  Filled 2018-05-14: qty 1

## 2018-05-14 MED ORDER — METOPROLOL SUCCINATE ER 25 MG PO TB24: 12.5000 mg | ORAL_TABLET | Freq: Every day | ORAL | 0 refills | Status: DC

## 2018-05-14 MED ORDER — APIXABAN 2.5 MG PO TABS
2.5000 mg | ORAL_TABLET | Freq: Two times a day (BID) | ORAL | Status: DC
  Administered 2018-05-14: 2.5 mg via ORAL
  Filled 2018-05-14: qty 1

## 2018-05-14 NOTE — Progress Notes (Signed)
  Echocardiogram 2D Echocardiogram has been performed.  Jennette Dubin 05/14/2018, 9:20 AM

## 2018-05-14 NOTE — Progress Notes (Signed)
Patient ready for discharge. 

## 2018-05-14 NOTE — Plan of Care (Signed)
  Problem: Education: Goal: Knowledge of General Education information will improve Description Including pain rating scale, medication(s)/side effects and non-pharmacologic comfort measures Outcome: Adequate for Discharge   Problem: Health Behavior/Discharge Planning: Goal: Ability to manage health-related needs will improve Outcome: Adequate for Discharge   

## 2018-05-14 NOTE — Progress Notes (Signed)
ANTICOAGULATION CONSULT NOTE - Follow Up Consult  Pharmacy Consult for Heparin Indication: atrial fibrillation  Allergies  Allergen Reactions  . Clindamycin/Lincomycin Diarrhea    diarrhea  . Hemorrhoid Preparation [Diperodon] Other (See Comments)    Listed on paper work  . Ibuprofen Other (See Comments)    Long time ago/ list on paper work  . Penicillins Other (See Comments)    Did it involve swelling of the face/tongue/throat, SOB, or low BP? Unknown Did it involve sudden or severe rash/hives, skin peeling, or any reaction on the inside of your mouth or nose? Unknown Did you need to seek medical attention at a hospital or doctor's office? Unknown When did it last happen? If all above answers are "NO", may proceed with cephalosporin use.    Patient Measurements: Height: 5' (152.4 cm) Weight: 124 lb 3.2 oz (56.3 kg)(scale b) IBW/kg (Calculated) : 45.5 Heparin Dosing Weight:    Vital Signs: Temp: 97.7 F (36.5 C) (03/03 1006) Temp Source: Oral (03/03 1006) BP: 172/98 (03/03 1230) Pulse Rate: 69 (03/03 1230)  Labs: Recent Labs    05/13/18 1042 05/13/18 1544 05/13/18 2151 05/14/18 0219 05/14/18 1136  HGB 14.6  --   --  12.2  --   HCT 45.1  --   --  36.4  --   PLT 246  --   --  211  --   HEPARINUNFRC  --   --   --  0.45 0.62  CREATININE 1.11*  --   --  0.90  --   TROPONINI  --  3.70* 7.28* 6.20*  --     Estimated Creatinine Clearance: 31.4 mL/min (by C-G formula based on SCr of 0.9 mg/dL).   Assessment:  Anticoag: Hep gtt for ACS, new afib - no anticoag PTA. Hgb WNL. +troponins. Heparin level 0.45 in goal. Confirmatory HL 0.62 remains in goal range.  Goal of Therapy:  Heparin level 0.3-0.7 units/ml Monitor platelets by anticoagulation protocol: Yes   Plan:  Cont heparin at 700 units/hr Daily HL and CBC Cardiology recommended low-dose Eliquis 2.5mg  BID for age and weight <60kg   Sissi Padia S. Alford Highland, PharmD, [place] Clinical Staff  Pharmacist Eilene Ghazi [place] 05/14/2018,12:59 PM

## 2018-05-14 NOTE — Progress Notes (Signed)
[place] for Heparin Indication: chest pain/ACS  Allergies  Allergen Reactions  . Clindamycin/Lincomycin Diarrhea    diarrhea  . Hemorrhoid Preparation [Diperodon] Other (See Comments)    Listed on paper work  . Ibuprofen Other (See Comments)    Long time ago/ list on paper work  . Penicillins Other (See Comments)    Did it involve swelling of the face/tongue/throat, SOB, or low BP? Unknown Did it involve sudden or severe rash/hives, skin peeling, or any reaction on the inside of your mouth or nose? Unknown Did you need to seek medical attention at a hospital or doctor's office? Unknown When did it last happen? If all above answers are "NO", may proceed with cephalosporin use.    Patient Measurements: Height: 5' (152.4 cm) Weight: 125 lb (56.7 kg) IBW/kg (Calculated) : 45.5 Heparin Dosing Weight: 57.5 kg  Vital Signs: Temp: 98 F (36.7 C) (03/02 1914) Temp Source: Oral (03/02 1914) BP: 134/53 (03/02 1914) Pulse Rate: 83 (03/02 1914)  Labs: Recent Labs    05/13/18 1042 05/13/18 1544 05/13/18 2151 05/14/18 0219  HGB 14.6  --   --  12.2  HCT 45.1  --   --  36.4  PLT 246  --   --  211  HEPARINUNFRC  --   --   --  0.45  CREATININE 1.11*  --   --  0.90  TROPONINI  --  3.70* 7.28* 6.20*    Estimated Creatinine Clearance: 31.5 mL/min (by C-G formula based on SCr of 0.9 mg/dL).   Medical History: Past Medical History:  Diagnosis Date  . Allergic rhinitis    pollen and grass  . Asthma   . Atrial fibrillation ([place]) 05/13/2018  . Blepharitis   . Carpal tunnel syndrome, bilateral 07/03/2016  . Family history of adverse reaction to anesthesia    " [place] ([place]   . Hyperlipidemia   . Irregular heart rhythm   . Macular degeneration 2012  . Osteoporosis   . Sensorineural hearing loss, bilateral 2007  . Shingles 2004  . Skin cancer 2005   nose  . Torn meniscus 2006   right  knee  . Tremor 2006   familiar.  dx by Dr. Quillian Quince  . Unspecified essential hypertension   . Unspecified hypothyroidism   . Urinary frequency     Medications:  Scheduled:  . aspirin EC  81 mg Oral Daily  . azelastine  1 spray Each Nare BID  . fluticasone furoate-vilanterol  1 puff Inhalation Daily  . sertraline  25 mg Oral Daily  . sodium chloride  1 spray Each Nare BID    Assessment: 61 yof presenting with chest pain, found to be in Afib by EMS. No anticoag PTA. Receiving DVT ppx dose of enoxaparin 30 mg today at 1539.  Hgb 14.6, plt 246. Troponin increased from 0.14>3.7. Scr 1.11 (CrCl 26 mL/min). Given recent enoxaparin dose (~0.5 mg/kg), will start heparin infusion without bolus.   3/3 AM update: initial heparin level therapeutic   Goal of Therapy:  Heparin level 0.3-0.7 units/ml Monitor platelets by anticoagulation protocol: Yes   Plan:  Cont heparin at 700 units/hr Confirmatory heparin level at [place], PharmD, [place] Pharmacist Phone: [phone]

## 2018-05-14 NOTE — Progress Notes (Signed)
Progress Note  Patient Name: Christina Lawson Date of Encounter: 05/14/2018  Primary Cardiologist: Elouise Munroe, MD  Subjective   Feeling well this AM. Troponin peaked and now downtrending, denies any chest pain. Reviewed results of echo with her today. Met with daughter later as well to discuss medication plan.  Inpatient Medications    Scheduled Meds: . aspirin EC  81 mg Oral Daily  . azelastine  1 spray Each Nare BID  . fluticasone furoate-vilanterol  1 puff Inhalation Daily  . sertraline  25 mg Oral Daily  . sodium chloride  1 spray Each Nare BID   Continuous Infusions: . heparin 700 Units/hr (05/13/18 1814)   PRN Meds: acetaminophen, albuterol, ALPRAZolam, HYDROcodone-acetaminophen, loratadine, ondansetron (ZOFRAN) IV   Vital Signs    Vitals:   05/14/18 0427 05/14/18 0430 05/14/18 0923 05/14/18 1006  BP:  140/70  (!) 175/78  Pulse:  67  64  Resp:  18  20  Temp:  98.5 F (36.9 C)  97.7 F (36.5 C)  TempSrc:  Oral  Oral  SpO2:  95% 96% 99%  Weight: 56.3 kg     Height:        Intake/Output Summary (Last 24 hours) at 05/14/2018 1040 Last data filed at 05/14/2018 0929 Gross per 24 hour  Intake 753.3 ml  Output 100 ml  Net 653.3 ml   Last 3 Weights 05/14/2018 05/13/2018 05/13/2018  Weight (lbs) 124 lb 3.2 oz 125 lb 130 lb  Weight (kg) 56.337 kg 56.7 kg 58.968 kg      Telemetry    Sinus rhythm with intermittent fast, irregular, wide complex beats. Polymorphic. Afib with aberrancy vs. Polymorphic NSVT - Personally Reviewed  ECG    3/2 most recent, no new - Personally Reviewed  Physical Exam   GEN: No acute distress.   Neck: No JVD Cardiac: RRR, no murmurs, rubs, or gallops.  Respiratory: Clear to auscultation bilaterally. GI: Soft, nontender, non-distended  MS: No edema; No deformity. Neuro:  Nonfocal  Psych: Normal affect   Labs    Chemistry Recent Labs  Lab 05/13/18 1042 05/14/18 0219  NA 140 142  K 3.8 3.4*  CL 105 108  CO2 22 22    GLUCOSE 185* 97  BUN 15 19  CREATININE 1.11* 0.90  CALCIUM 9.9 9.0  PROT 7.3  --   ALBUMIN 4.0  --   AST 28  --   ALT 20  --   ALKPHOS 58  --   BILITOT 1.2  --   GFRNONAA 43* 56*  GFRAA 50* >60  ANIONGAP 13 12     Hematology Recent Labs  Lab 05/13/18 1042 05/14/18 0219  WBC 8.7 7.9  RBC 4.61 3.87  HGB 14.6 12.2  HCT 45.1 36.4  MCV 97.8 94.1  MCH 31.7 31.5  MCHC 32.4 33.5  RDW 13.2 13.3  PLT 246 211    Cardiac Enzymes Recent Labs  Lab 05/13/18 1544 05/13/18 2151 05/14/18 0219  TROPONINI 3.70* 7.28* 6.20*    Recent Labs  Lab 05/13/18 1049  TROPIPOC 0.14*     BNPNo results for input(s): BNP, PROBNP in the last 168 hours.   DDimer No results for input(s): DDIMER in the last 168 hours.   Radiology    Dg Chest Port 1 View  Result Date: 05/13/2018 CLINICAL DATA:  Central chest pain and shortness of breath. EXAM: PORTABLE CHEST 1 VIEW COMPARISON:  Chest x-ray dated May 31, 2010. FINDINGS: Stable cardiomegaly. Normal pulmonary vascularity. Atherosclerotic calcification of  the aortic arch. No focal consolidation, pleural effusion, or pneumothorax. No acute osseous abnormality. Unchanged moderate hiatal hernia. IMPRESSION: No active disease. Electronically Signed   By: Titus Dubin M.D.   On: 05/13/2018 11:14    Cardiac Studies   Echo read by me 05/14/18  1. The left ventricle has normal systolic function, with an ejection fraction of 55-60%. The cavity size was mildly decreased. Left ventricular diastolic Doppler parameters are consistent with impaired relaxation The E/e' is 26.6. No evidence of left  ventricular regional wall motion abnormalities.  2. No evidence of left ventricular regional wall motion abnormalities.  3. The right ventricle has normal systolic function. The cavity was normal. There is no increase in right ventricular wall thickness.  4. The mitral valve is normal in structure. Mild calcification of the mitral valve leaflet. There is moderate  mitral annular calcification present. Mitral valve regurgitation is mild to moderate by color flow Doppler.  5. The tricuspid valve is normal in structure.  6. The aortic valve is tricuspid Mild calcification of the aortic valve.  7. The pulmonic valve was normal in structure.  Patient Profile     85 y.o. female with hx of hypertension, hypothyroidism, tremor, hyperlipidemia, prior TIA, recent syncope who is being followed for the evaluation of chest discomfort and new onset atrial fibrillation at the request of Dr. Karmen Bongo.  Assessment & Plan    New onset afib: chadsvasc=6. Discussed anticoagulation pros/cons at length. Starting on heparin drip per primary team. Denies any active bleeding. She is willing to consider anticoagulation. -will start low dose metoprolol succinate given paroxysmal tachycardia (likely afib with aberrancy vs. Polymorphic NSVT) -discussed anticoagulation, is amenable. Based on age and weight, will start apixaban 2.5 mg BID  Chest pressure, elevated troponin: symptoms resolved. Rise/fall, ECG changes concerning for NSTEMI, but normal wall motion on echo. Now that downtrending, in line with demand ischemia from atrial fibrillation with RVR -echocardiogram read personally by me, normal LV EF, no regional wall motion abnormalities -troponin peaked at 7.28, now downtrending -stopped aspirin given preference for minimal medications and start of anticoagulation -starting low dose beta blocker -declines additional medications including statin  Confirmed DNR, no aggressive measures  [facility] will sign off.   Medication Recommendations:  apixaban 2.5 mg BID, metoprolol succinate 12.5 mg daily Other recommendations (labs, testing, etc):  Counseled on continuing hydration Follow up as an outpatient:  We will request follow up with Dr. Delphina Cahill office.  TIME SPENT WITH PATIENT: 25 minutes of direct patient care. More than 50% of that time was spent on  coordination of care and counseling regarding test results and medication recommendations.  Buford Dresser, MD, PhD [facility]   For questions or updates, please contact [place] Please consult [email or web address] for contact info under     Signed, Buford Dresser, MD  05/14/2018, 10:40 AM

## 2018-05-14 NOTE — Discharge Summary (Signed)
Physician Discharge Summary  Christina Lawson VZC:[id number] DOB: [birth year] DOA: 05/13/2018  PCP: Gayland Curry, DO  Admit date: 05/13/2018 Discharge date: 05/14/2018  Admitted From: [name] Disposition: [facility]   Recommendations for Outpatient Follow-up:  1. Follow up with PCP as scheduled this week 2. Follow up with cardiology as scheduled prior to discharge 3. Monitor HR, BP. Newly started low dose metoprolol. 4. Please obtain BMP/CBC, monitor for bleeding. Newly started eliquis.   Home Health: None new Equipment/Devices: None new Discharge Condition: Stable CODE STATUS: DNR Diet recommendation: Heart healthy  Brief/Interim Summary: Per HPI: Christina Lawson is a 85 y.o. female with medical history significant of hypothyroidism; HTN; essential tremor; HLD; and macular degeneration presenting with chest pain. This AM, she awoke about 0700.  She was up in the night for about 2 hours, which is unusual.  No pain then, just not sleeping.  Upon awakening, she was ok but she got out of the shower and started feeling weak and noticed chest pressure.  She sat on the floor for a while and eventually made her way to bed.  Then, she called the nurse - who gave her her Albuterol.  +SOB, but this has resolved.  Her chest pressure has resolved, but she is uncertain what made that happen.   ED Course:   Chest pain, no palpitations.  Afib into 160s by EMS.  Given Cardizem 10 mg IV x 1, reverted to NSR.  Troponin 0.14.  Feels better.  Cardiology consulted - ok for obs, echo, declines cath.  Troponin trended upward to >7 and subsequently down to 6.20. Heparin was started. Cardiology has recommended low dose metoprolol and beginning eliquis due to elevated risk of stroke. The patient declined any further interventions and is discharged in stable condition 3/3.  Discharge Diagnoses:  Principal Problem:   Atrial fibrillation with RVR (HCC) Active Problems:   Dyspnea   Essential hypertension    Hyperglycemia   Anxiety   Acquired hypothyroidism   Chest pain  New onset paroxysmal atrial fibrillation with RVR:  - Heparin drip started in setting of possible NSTEMI as well, after discussions with family, cardiology has recommended reduced-dose eliquis which has been started in the hospital. CHA2DS2-VASc score is 6. - Continue low dose metoprolol for rate control strategy.   Type 2 NSTEMI: Troponin peaked at 7.28. In the absence of wall motion abnormalities on echocardiogram, this is presumed to be due to demand from AFib with RVR.  - Eliquis as above, will discontinue aspirin.  - Low dose BB - Declines statin.  - No further intervention desired by patient.   Asthma: Chronic, stable - Continue home medications  HTN: Not on medications.  - Start metoprolol  HLD - Declines statin or other medications  Anxiety -Continue Zoloft and prn Xanax.  Hypothyroidism: TSH, T4 normal without medication.  Confirmed DNR, no aggressive measures  Discharge Instructions Discharge Instructions    Diet - low sodium heart healthy   Complete by:  As directed    Discharge instructions   Complete by:  As directed    You were admitted with abnormal heart rate/rhythm called AFib and have improved with medications to control the heart rate. This also caused an elevated troponin level suspicious for a heart attack (NSTEMI) though there was no sign of this on the echocardiogram and no further intervention is planned since the level has started to go down. This puts you at higher risk of stroke, so a blood thinner at a reduced dose was started,  called eliquis, which you will take twice a day. You should stop taking aspirin since you are taking this medication. To continue with rate control, metoprolol has been prescribed, sent to your pharmacy, to be taken once daily.  - Follow up with Dr. Mariea Clonts tomorrow as scheduled and with cardiology as scheduled below. - If your symptoms return, especially if you  develop chest pain or increased shortness of breath or bleeding, seek medical attention right away.   Increase activity slowly   Complete by:  As directed      Allergies as of 05/14/2018      Reactions   Clindamycin/lincomycin Diarrhea   diarrhea   Hemorrhoid Preparation [diperodon] Other (See Comments)   Listed on paper work   Ibuprofen Other (See Comments)   Long time ago/ list on paper work   Penicillins Other (See Comments)   Did it involve swelling of the face/tongue/throat, SOB, or low BP? Unknown Did it involve sudden or severe rash/hives, skin peeling, or any reaction on the inside of your mouth or nose? Unknown Did you need to seek medical attention at a hospital or doctor's office? Unknown When did it last happen? If all above answers are "NO", may proceed with cephalosporin use.      Medication List    STOP taking these medications   ALPRAZolam 0.25 MG tablet Commonly known as:  XANAX   aspirin EC 81 MG tablet     TAKE these medications   albuterol 108 (90 Base) MCG/ACT inhaler Commonly known as:  PROVENTIL HFA;VENTOLIN HFA Inhale 1 puff into the lungs every 6 (six) hours as needed for wheezing.   apixaban 2.5 MG Tabs tablet Commonly known as:  ELIQUIS Take 1 tablet (2.5 mg total) by mouth 2 (two) times daily.   Azelastine HCl 0.15 % Soln USE 1 SPRAY NASALLY TWICE A DAY What changed:  See the new instructions.   calcium carbonate 500 MG chewable tablet Commonly known as:  TUMS - dosed in mg elemental calcium Chew 1 tablet by mouth daily.   Diclofenac Sodium 1 % Crea Place 1 application onto the skin as needed. What changed:    when to take this  reasons to take this   docusate sodium 100 MG capsule Commonly known as:  COLACE Take 200 mg by mouth daily as needed.   fluocinonide 0.05 % external solution Commonly known as:  LIDEX Apply 1 application topically 2 (two) times daily as needed (hair).   fluticasone furoate-vilanterol 100-25  MCG/INH Aepb Commonly known as:  BREO ELLIPTA Inhale 1 puff into the lungs daily.   HYDROcodone-acetaminophen 5-325 MG tablet Commonly known as:  NORCO Take 1 tablet by mouth 3 (three) times daily as needed for severe pain.   loratadine 10 MG tablet Commonly known as:  CLARITIN Take 10 mg by mouth daily.   metoprolol succinate 25 MG 24 hr tablet Commonly known as:  TOPROL-XL Take 0.5 tablets (12.5 mg total) by mouth daily.   minoxidil 2 % external solution Commonly known as:  ROGAINE Apply topically daily. To regrow hair   PRESERVISION AREDS PO Take 1 tablet by mouth 2 (two) times daily.   sertraline 25 MG tablet Commonly known as:  ZOLOFT Take 1 tablet (25 mg total) by mouth daily.   SIMPLY SALINE 0.9 % Aers Generic drug:  Saline Place 1 spray into the nose daily.   Vitamin D 50 MCG (2000 UT) Caps Take 1 capsule (2,000 Units total) by mouth daily.  Follow-up Information    Lendon Colonel, NP Follow up.   Specialties:  Nurse Practitioner, Radiology, Cardiology Why:  You have a hospital follow-up scheduled for 05/28/2018 at 8:30am. Please arrive 15 minutes early for check-in. Contact information: [address] [place] [address] [phone]        Hollace Kinnier L, DO Follow up on 05/15/2018.   Specialty:  Geriatric Medicine Contact information: [place] [place] [address] [phone]          Allergies  Allergen Reactions  . Clindamycin/Lincomycin Diarrhea    diarrhea  . Hemorrhoid Preparation [Diperodon] Other (See Comments)    Listed on paper work  . Ibuprofen Other (See Comments)    Long time ago/ list on paper work  . Penicillins Other (See Comments)    Did it involve swelling of the face/tongue/throat, SOB, or low BP? Unknown Did it involve sudden or severe rash/hives, skin peeling, or any reaction on the inside of your mouth or nose? Unknown Did you need to seek medical attention at a hospital or doctor's  office? Unknown When did it last happen? If all above answers are "NO", may proceed with cephalosporin use.    Consultations:  Cardiology  Procedures/Studies: Dg Chest Port 1 View  Result Date: 05/13/2018 CLINICAL DATA:  Central chest pain and shortness of breath. EXAM: PORTABLE CHEST 1 VIEW COMPARISON:  Chest x-ray dated May 31, 2010. FINDINGS: Stable cardiomegaly. Normal pulmonary vascularity. Atherosclerotic calcification of the aortic arch. No focal consolidation, pleural effusion, or pneumothorax. No acute osseous abnormality. Unchanged moderate hiatal hernia. IMPRESSION: No active disease. Electronically Signed   By: Titus Dubin M.D.   On: 05/13/2018 11:14   Subjective: Feels her breathing is better, back to baseline. No chest pain. No leg swelling, orthopnea. Wants to go home ASAP.  Discharge Exam: Vitals:   05/14/18 0923 05/14/18 1006  BP:  (!) 175/78  Pulse:  64  Resp:  20  Temp:  97.7 F (36.5 C)  SpO2: 96% 99%   General: Pt is alert, awake, not in acute distress Cardiovascular: RRR, S1/S2 +, no rubs, no gallops Respiratory: CTA bilaterally, no wheezing, no rhonchi Abdominal: Soft, NT, ND, bowel sounds + Extremities: No edema, no cyanosis  Labs: BNP (last 3 results) No results for input(s): BNP in the last 8760 hours. Basic Metabolic Panel: Recent Labs  Lab 05/13/18 1042 05/14/18 0219  NA 140 142  K 3.8 3.4*  CL 105 108  CO2 22 22  GLUCOSE 185* 97  BUN 15 19  CREATININE 1.11* 0.90  CALCIUM 9.9 9.0   Liver Function Tests: Recent Labs  Lab 05/13/18 1042  AST 28  ALT 20  ALKPHOS 58  BILITOT 1.2  PROT 7.3  ALBUMIN 4.0   No results for input(s): LIPASE, AMYLASE in the last 168 hours. No results for input(s): AMMONIA in the last 168 hours. CBC: Recent Labs  Lab 05/13/18 1042 05/14/18 0219  WBC 8.7 7.9  NEUTROABS 6.8  --   HGB 14.6 12.2  HCT 45.1 36.4  MCV 97.8 94.1  PLT 246 211   Cardiac Enzymes: Recent Labs  Lab  05/13/18 1544 05/13/18 2151 05/14/18 0219  TROPONINI 3.70* 7.28* 6.20*   BNP: Invalid input(s): POCBNP CBG: No results for input(s): GLUCAP in the last 168 hours. D-Dimer No results for input(s): DDIMER in the last 72 hours. Hgb A1c No results for input(s): HGBA1C in the last 72 hours. Lipid Profile Recent Labs    05/14/18 0219  CHOL 211*  HDL 53  LDLCALC 146*  TRIG 61  CHOLHDL 4.0   Thyroid function studies Recent Labs    05/13/18 1321  TSH 4.418   Anemia work up No results for input(s): VITAMINB12, FOLATE, FERRITIN, TIBC, IRON, RETICCTPCT in the last 72 hours. Urinalysis    Component Value Date/Time   COLORURINE YELLOW 04/13/2018 1607   APPEARANCEUR CLEAR 04/13/2018 1607   LABSPEC <1.005 (L) 04/13/2018 1607   PHURINE 7.0 04/13/2018 1607   GLUCOSEU NEGATIVE 04/13/2018 1607   HGBUR NEGATIVE 04/13/2018 1607   BILIRUBINUR NEGATIVE 04/13/2018 1607   KETONESUR NEGATIVE 04/13/2018 1607   PROTEINUR NEGATIVE 04/13/2018 1607   NITRITE NEGATIVE 04/13/2018 1607   LEUKOCYTESUR NEGATIVE 04/13/2018 1607    Microbiology No results found for this or any previous visit (from the past 240 hour(s)).  Time coordinating discharge: Approximately 40 minutes  Patrecia Pour, MD  [facility] 05/14/2018, 12:06 PM Pager [phone]

## 2018-05-15 ENCOUNTER — Non-Acute Institutional Stay: Payer: Medicare Other | Admitting: Internal Medicine

## 2018-05-15 ENCOUNTER — Telehealth: Payer: Self-pay | Admitting: *Deleted

## 2018-05-15 ENCOUNTER — Encounter: Payer: Self-pay | Admitting: Internal Medicine

## 2018-05-15 VITALS — BP 138/70 | HR 62 | Temp 98.5°F | Ht 60.0 in | Wt 128.0 lb

## 2018-05-15 DIAGNOSIS — E7800 Pure hypercholesterolemia, unspecified: Secondary | ICD-10-CM | POA: Diagnosis not present

## 2018-05-15 DIAGNOSIS — G20 Parkinson's disease: Secondary | ICD-10-CM

## 2018-05-15 DIAGNOSIS — G20C Parkinsonism, unspecified: Secondary | ICD-10-CM

## 2018-05-15 DIAGNOSIS — I214 Non-ST elevation (NSTEMI) myocardial infarction: Secondary | ICD-10-CM | POA: Diagnosis not present

## 2018-05-15 DIAGNOSIS — I4891 Unspecified atrial fibrillation: Secondary | ICD-10-CM | POA: Diagnosis not present

## 2018-05-15 DIAGNOSIS — Z8673 Personal history of transient ischemic attack (TIA), and cerebral infarction without residual deficits: Secondary | ICD-10-CM | POA: Diagnosis not present

## 2018-05-15 DIAGNOSIS — J45909 Unspecified asthma, uncomplicated: Secondary | ICD-10-CM

## 2018-05-15 NOTE — Progress Notes (Signed)
Location:  [facility] Provider: Deshanti Adcox L. Mariea Clonts, D.O., C.M.D.  Code Status: DNR Goals of Care:  Advanced Directives 05/13/2018  Does Patient Have a Medical Advance Directive? Yes  Type of Advance Directive Out of facility DNR (pink MOST or yellow form);[place];Living will  Does patient want to make changes to medical advance directive? No - Patient declined  Copy of [place] in Chart? Yes - validated most recent copy scanned in chart (See row information)  Pre-existing out of facility DNR order (yellow form or pink MOST form) Yellow form placed in chart (order not valid for inpatient use)     Chief Complaint  Patient presents with  . Transitions Of Care    discharged 05/13/18    HPI: Patient is a 85 y.o. Lawson seen today for hospital follow-up s/p admission from 05/13/2018 to 05/14/2018 with weakness, cold sensation, and headache which was not typical for her to get.  Her symptoms began early 3/2 when she was showering in preparation for a dental appt.  She made the shower quick, then got out, went to the toilet.  While sitting there, she felt so weak, she could not get up to go to the bed.  She apparently slipped off the toilet but did not hurt herself.  She stayed there a while, but did finally get the energy to get up and walk to her bedroom.  There she pressed the button and security and the clinic nurse came to evaluate her.  She had hypotension and tachycardia with irregular heart bear.  EMS was called and she was taken to the ED.  There, by her report, she got IVFs in here left arm and medications in the right.  They would not let her out of the bed and didn't give her a meal until the evening, but overall were nice.  She was allowed out of bed into the chair the second day, but still not allowed to walk around.  They took her blood every 6 hrs.  It took a while to get her breo, but she was less sob afterwards.    We reviewed her hospitalization--she  was in new onset afib with RVR and had elevated troponins consistent with NSTEMI.   Dr. Margaretann Loveless from cardiology saw her and they discussed testing.  She decided she did not want catheterization and was managed medically.  She was started on metoprolol and eliquis and her aspirin was stopped.    We reviewed the xanax, as well, which has helped her tremor.  She struggles with anxiety and it has helped that, as well.  She would like to continue it.  She really has not used the hydrocodone at all due to fear of opioid addiction though she was not getting good pain relief at our last visit with tramadol or tylenol.  Today, she feels quite well.  She had a headache when she awake, but her symptoms improved after taking her allergy medication.  She was not sob at all walking over here.  She has been full of energy, actually, and cleaned up her table at home.      Past Medical History:  Diagnosis Date  . Allergic rhinitis    pollen and grass  . Asthma   . Atrial fibrillation ([place]) 05/13/2018  . Blepharitis   . Carpal tunnel syndrome, bilateral 07/03/2016  . Family history of adverse reaction to anesthesia    " [place]   .  Hyperlipidemia   . Irregular heart rhythm   . Macular degeneration 2012  . Osteoporosis   . Sensorineural hearing loss, bilateral 2007  . Shingles 2004  . Skin cancer 2005   nose  . Torn meniscus 2006   right knee  . Tremor 2006   familiar.  dx by Dr. Quillian Quince  . Unspecified essential hypertension   . Unspecified hypothyroidism   . Urinary frequency     Past Surgical History:  Procedure Laterality Date  . ABDOMINAL HYSTERECTOMY  1977  . CATARACT EXTRACTION  1974   Left, IOL implants  . CATARACT EXTRACTION Right 1979   IOL implants  . CHOLECYSTECTOMY  1981  . COSMETIC SURGERY  1995   eye  . [place]  . DILATION AND CURETTAGE OF UTERUS  1977  . [place]  . HIATAL HERNIA REPAIR   1977  . SKIN CANCER EXCISION  2005   nose  . THYROIDECTOMY  1955  . TONSILLECTOMY  1931    Allergies  Allergen Reactions  . Clindamycin/Lincomycin Diarrhea    diarrhea  . Hemorrhoid Preparation [Diperodon] Other (See Comments)    Listed on paper work  . Ibuprofen Other (See Comments)    Long time ago/ list on paper work  . Penicillins Other (See Comments)    Did it involve swelling of the face/tongue/throat, SOB, or low BP? Unknown Did it involve sudden or severe rash/hives, skin peeling, or any reaction on the inside of your mouth or nose? Unknown Did you need to seek medical attention at a hospital or doctor's office? Unknown When did it last happen? If all above answers are "NO", may proceed with cephalosporin use.    Outpatient Encounter Medications as of 05/15/2018  Medication Sig  . albuterol (PROVENTIL HFA;VENTOLIN HFA) 108 (90 Base) MCG/ACT inhaler Inhale 1 puff into the lungs every 6 (six) hours as needed for wheezing.   Marland Kitchen apixaban (ELIQUIS) 2.5 MG TABS tablet Take 1 tablet (2.5 mg total) by mouth 2 (two) times daily.  . Azelastine HCl 0.15 % SOLN USE 1 SPRAY NASALLY TWICE A DAY  . calcium carbonate (TUMS - DOSED IN MG ELEMENTAL CALCIUM) 500 MG chewable tablet Chew 1 tablet by mouth daily.  . Cholecalciferol (VITAMIN D) 2000 units CAPS Take 1 capsule (2,000 Units total) by mouth daily.  . Diclofenac Sodium 1 % CREA Place 1 application onto the skin as needed.  . docusate sodium (COLACE) 100 MG capsule Take 200 mg by mouth daily as needed.    . fluocinonide (LIDEX) 0.05 % external solution Apply 1 application topically 2 (two) times daily as needed (hair).   . fluticasone furoate-vilanterol (BREO ELLIPTA) 100-25 MCG/INH AEPB Inhale 1 puff into the lungs daily.  Marland Kitchen HYDROcodone-acetaminophen (NORCO) 5-325 MG tablet Take 1 tablet by mouth 3 (three) times daily as needed for severe pain.  Marland Kitchen loratadine (CLARITIN) 10 MG tablet Take 10 mg by mouth daily.   . metoprolol  succinate (TOPROL-XL) 25 MG 24 hr tablet Take 0.5 tablets (12.5 mg total) by mouth daily.  . minoxidil (ROGAINE) 2 % external solution Apply topically daily. To regrow hair  . Multiple Vitamins-Minerals (PRESERVISION AREDS PO) Take 1 tablet by mouth 2 (two) times daily.  . Saline (SIMPLY SALINE) 0.9 % AERS Place 1 spray into the nose daily.   . sertraline (ZOLOFT) 25 MG tablet Take 1 tablet (25 mg total) by mouth daily.   No facility-administered encounter medications on file as of 05/15/2018.  Review of Systems:  Review of Systems  Constitutional: Negative for chills, fever and malaise/fatigue.  HENT: Positive for hearing loss.   Eyes:       Glasses   Respiratory: Negative for cough and shortness of breath.   Cardiovascular: Negative for chest pain, palpitations, orthopnea, leg swelling and PND.  Gastrointestinal: Negative for abdominal pain.  Genitourinary: Negative for dysuria.  Musculoskeletal: Positive for joint pain. Negative for falls.  Neurological: Positive for headaches. Negative for tingling, sensory change, focal weakness and loss of consciousness.  Psychiatric/Behavioral: Positive for memory loss. Negative for depression. The patient is nervous/anxious.        Very minimal mild cognitve impairment    Health Maintenance  Topic Date Due  . TETANUS/TDAP  04/07/2025  . INFLUENZA VACCINE  Completed  . DEXA SCAN  Completed  . PNA vac Low Risk Adult  Completed    Physical Exam: Vitals:   05/15/18 0841  BP: 138/70  Pulse: 62  Temp: 98.5 F (36.9 C)  TempSrc: Oral  SpO2: 97%  Weight: 128 lb (58.1 kg)  Height: 5' (1.524 m)   Body mass index is 25 kg/m. Physical Exam Constitutional:      General: She is not in acute distress.    Appearance: Normal appearance. She is normal weight. She is not toxic-appearing.  HENT:     Head: Normocephalic and atraumatic.  Cardiovascular:     Heart sounds: No murmur. No friction rub. No gallop.      Comments: irreg  irreg Pulmonary:     Effort: Pulmonary effort is normal.     Breath sounds: Normal breath sounds. No wheezing or rales.  Abdominal:     General: Bowel sounds are normal.  Skin:    General: Skin is warm and dry.  Neurological:     General: No focal deficit present.     Mental Status: She is alert and oriented to person, place, and time.     Comments: Fast tremor of both hands  Psychiatric:        Mood and Affect: Mood normal.     Labs reviewed: Basic Metabolic Panel: Recent Labs    01/24/18 1401 04/09/18 05/13/18 1042 05/13/18 1321 05/14/18 0219  NA 140 143 140  --  142  K 4.3 5.2 3.8  --  3.4*  CL 104  --  105  --  108  CO2 29  --  22  --  22  GLUCOSE 94  --  185*  --  97  BUN 15 14 15   --  19  CREATININE 0.79 0.7 1.11*  --  0.90  CALCIUM 9.6  --  9.9  --  9.0  TSH  --   --   --  4.418  --    Liver Function Tests: Recent Labs    10/02/17 0600 01/24/18 1401 05/13/18 1042  AST 23 20 28   ALT 18 19 20   ALKPHOS Christina  --  58  BILITOT  --  0.6 1.2  PROT  --  6.9 7.3  ALBUMIN  --   --  4.0   No results for input(s): LIPASE, AMYLASE in the last 8760 hours. No results for input(s): AMMONIA in the last 8760 hours. CBC: Recent Labs    01/24/18 1401 05/13/18 1042 05/14/18 0219  WBC 7.1 8.7 7.9  NEUTROABS 4,629 6.8  --   HGB 13.9 14.6 12.2  HCT 41.8 45.1 36.4  MCV 94.6 97.8 94.1  PLT 262 246 211   Lipid  Panel: Recent Labs    10/02/17 0600 04/09/18 05/14/18 0219  CHOL 230* 220* 211*  HDL 63 64 53  LDLCALC 144 137 146*  TRIG 116 96 61  CHOLHDL  --   --  4.0   Lab Results  Component Value Date   HGBA1C 5.9 10/02/2017    Procedures since last visit: Dg Chest Port 1 View  Result Date: 05/13/2018 CLINICAL DATA:  Central chest pain and shortness of breath. EXAM: PORTABLE CHEST 1 VIEW COMPARISON:  Chest x-ray dated May 31, 2010. FINDINGS: Stable cardiomegaly. Normal pulmonary vascularity. Atherosclerotic calcification of the aortic arch. No focal  consolidation, pleural effusion, or pneumothorax. No acute osseous abnormality. Unchanged moderate hiatal hernia. IMPRESSION: No active disease. Electronically Signed   By: Titus Dubin M.D.   On: 05/13/2018 11:14    Assessment/Plan 1. Atrial fibrillation with RVR (HCC) -rate is now controlled with metoprolol succinate bid and anticoagulation with eliquis low dosage due to age, renal function and weight -keep f/u with Dr. Delphina Cahill PA at cardiology  2. NSTEMI (non-ST elevated myocardial infarction) ([place]) -seems she's recovered well, no dyspnea and normal energy levels -is waiting to swim a few days which I encouraged her to wait, also  3. Parkinsonism, unspecified Parkinsonism type ([place]) -may resume her xanax prn for this and her anxiety--was prescribed by neurology and has helped her actually despite Beer's list status -cont zoloft also for the anxiety  4. Asthma, intrinsic -cont breo, prn albuterol, doing well  5. H/O TIA (transient ischemic attack) and stroke -in the past, may have had prior afib, but has never when we've evaluated her before -cont on eliquis now in place of asa  6. Pure hypercholesterolemia -has opted to avoid statin therapy--c/o pill burden as it is and she's 89  Labs/tests ordered:  No new  Next appt:  3 mos med mgt  Rashaun Curl L. Kalin Kyler, D.O. [facility] [address], [place] [address] Cell Phone (Mon-Fri 8am-5pm):  [phone] On Call:  [phone] & follow prompts after 5pm & weekends Office Phone:  [phone] Office Fax:  [phone]

## 2018-05-15 NOTE — Telephone Encounter (Signed)
-----   Message from Gayland Curry, DO sent at 05/15/2018  9:34 AM EST ----- Volanda Napoleon,   Can you please do a TOC call for MRs Kochan?  I already saw her this morning after hospital d/c yesterday.     Thanks, [employer]

## 2018-05-15 NOTE — Telephone Encounter (Signed)
Transition Care Management Follow-up Telephone Call  Date of discharge and from where: 05/13/18 From [facility]  How have you been since you were released from the hospital? Patient stated that she has been ok and saw Dr. Mariea Clonts this morning  Any questions or concerns? No   Items Reviewed:  Did the pt receive and understand the discharge instructions provided? Yes   Medications obtained and verified? Yes   Any new allergies since your discharge? No   Dietary orders reviewed? Yes  Do you have support at home? Yes  daughter  Other (ie: DME, Home Health, etc) No Home Health and doesn't think she needs it.   Functional Questionnaire: (I = Independent and D = Dependent) ADL's: I  Bathing/Dressing- I   Meal Prep- D [facility]  Eating- I  Maintaining continence- I  Transferring/Ambulation- I  Managing Meds- I   Follow up appointments reviewed:    PCP Hospital f/u appt confirmed? Yes  Scheduled to see Dr Mariea Clonts on 05/15/18 .  [facility] f/u appt confirmed? Yes  Scheduled to see Cardilogist on 3/17   Are transportation arrangements needed? No   If their condition worsens, is the pt aware to call  their PCP or go to the ED? Yes  Was the patient provided with contact information for the PCP's office or ED? Yes  Was the pt encouraged to call back with questions or concerns? Yes

## 2018-05-20 ENCOUNTER — Other Ambulatory Visit: Payer: Self-pay | Admitting: Allergy & Immunology

## 2018-05-27 NOTE — Progress Notes (Signed)
Cardiology Office Note   Date:  05/28/2018   ID:  Kinzey Sheriff, DOB 1923-10-15, MRN [id number]  PCP:  Gayland Curry, DO  Cardiologist: Dr. Margaretann Loveless  Chief Complaint  Patient presents with  . Hospitalization Follow-up    C/OLIGHTHEADEDNESS IN THE AM  . Atrial Fibrillation    C/O NUMBNESS IN LEFT KNEE     History of Present Illness: Christina Lawson is a very pleasant 85 y.o. female who presents for ongoing assessment and management of HTN, hypothyroidism, hyperlipidemia, with hx of TIA recent syncope. She has a tremor as well. She was seen on consultation by Dr.Christopher, on 05/13/2018 while  hospitalized for chest discomfort. She is a resident of [facility]. She swims daily and walks when the weather is nice.   In the ER she was found to have rapid HR 120-170 bpm, with new onset atrial fibrillation,with hypotension. She received IV diltiazem and converted to NSR. She had elevated troponin possibly from demand ischemia.   She was placed on apixaban 2.5 mg, metoprolol. She declined additional testing. DNR status was confirmed and no aggressive measures will be taken. She denies any more symptoms of heart racing. She did have some light headedness when she began the metoprolol, but this has passed. She denies any bleeding or excessive bruising on Eliquis  She is medically compliant. She would like to be off the new medications as soon as she can as she does not like to take a lot of medications.   Past Medical History:  Diagnosis Date  . Allergic rhinitis    pollen and grass  . Asthma   . Atrial fibrillation ([place]) 05/13/2018  . Blepharitis   . Carpal tunnel syndrome, bilateral 07/03/2016  . Family history of adverse reaction to anesthesia    " [place]   . Hyperlipidemia   . Irregular heart rhythm   . Macular degeneration 2012  . Osteoporosis   . Sensorineural hearing loss, bilateral 2007  . Shingles 2004  .  Skin cancer 2005   nose  . Torn meniscus 2006   right knee  . Tremor 2006   familiar.  dx by Dr. Quillian Quince  . Unspecified essential hypertension   . Unspecified hypothyroidism   . Urinary frequency     Past Surgical History:  Procedure Laterality Date  . ABDOMINAL HYSTERECTOMY  1977  . CATARACT EXTRACTION  1974   Left, IOL implants  . CATARACT EXTRACTION Right 1979   IOL implants  . CHOLECYSTECTOMY  1981  . COSMETIC SURGERY  1995   eye  . [place]  . DILATION AND CURETTAGE OF UTERUS  1977  . [place]  . HIATAL HERNIA REPAIR  1977  . SKIN CANCER EXCISION  2005   nose  . THYROIDECTOMY  1955  . TONSILLECTOMY  1931     Current Outpatient Medications  Medication Sig Dispense Refill  . albuterol (PROVENTIL HFA;VENTOLIN HFA) 108 (90 Base) MCG/ACT inhaler Inhale 1 puff into the lungs every 6 (six) hours as needed for wheezing.     Marland Kitchen apixaban (ELIQUIS) 2.5 MG TABS tablet Take 1 tablet (2.5 mg total) by mouth 2 (two) times daily. 60 tablet 0  . Azelastine HCl 0.15 % SOLN USE 1 SPRAY NASALLY TWICE A DAY 90 mL 1  . BREO ELLIPTA 100-25 MCG/INH AEPB USE 1 INHALATION DAILY 180 each 1  . calcium carbonate (TUMS - DOSED IN MG ELEMENTAL CALCIUM) 500  MG chewable tablet Chew 1 tablet by mouth daily.    . Cholecalciferol (VITAMIN D) 2000 units CAPS Take 1 capsule (2,000 Units total) by mouth daily. 30 capsule 3  . Diclofenac Sodium 1 % CREA Place 1 application onto the skin as needed. 120 g 0  . docusate sodium (COLACE) 100 MG capsule Take 200 mg by mouth daily as needed.      . fluocinonide (LIDEX) 0.05 % external solution Apply 1 application topically 2 (two) times daily as needed (hair).     Marland Kitchen HYDROcodone-acetaminophen (NORCO) 5-325 MG tablet Take 1 tablet by mouth 3 (three) times daily as needed for severe pain. 90 tablet 0  . loratadine (CLARITIN) 10 MG tablet Take 10 mg by mouth daily.     . metoprolol succinate (TOPROL-XL) 25 MG 24 hr tablet Take 0.5  tablets (12.5 mg total) by mouth daily. 15 tablet 0  . minoxidil (ROGAINE) 2 % external solution Apply topically daily. To regrow hair    . Multiple Vitamins-Minerals (PRESERVISION AREDS PO) Take 1 tablet by mouth 2 (two) times daily.    . Saline (SIMPLY SALINE) 0.9 % AERS Place 1 spray into the nose daily.     . sertraline (ZOLOFT) 25 MG tablet Take 1 tablet (25 mg total) by mouth daily. 30 tablet 1   No current facility-administered medications for this visit.     Allergies:   Clindamycin/lincomycin; Hemorrhoid preparation [diperodon]; Ibuprofen; and Penicillins    Social History:  The patient  reports that she has never smoked. She has never used smokeless tobacco. She reports current alcohol use. She reports that she does not use drugs.   Family History:  The patient's family history includes Allergies in her father; Asthma in her father; Brain cancer in her mother; Breast cancer in her cousin, cousin, maternal aunt, maternal aunt, paternal aunt, and sister; Breast cancer (age of onset: 30) in her sister; Breast cancer (age of onset: 33) in her daughter; Cancer in her mother, sister, and sister; Emphysema in her father; Endometrial cancer in her sister; Heart disease in her maternal grandfather; Hypertension in her mother; Stroke in an other family member.    ROS: All other systems are reviewed and negative. Unless otherwise mentioned in H&P    PHYSICAL EXAM: VS:  BP (!) 158/70 (BP Location: Left Arm, Patient Position: Sitting, Cuff Size: Normal)   Pulse 62   Ht 5' (1.524 m)   Wt 128 lb 3.2 oz (58.2 kg)   BMI 25.04 kg/m  , BMI Body mass index is 25.04 kg/m. GEN: Well nourished, well developed, in no acute distress HEENT: normal Neck: no JVD, carotid bruits, or masses Cardiac: RRR; no murmurs, rubs, or gallops,no edema  Respiratory:  Clear to auscultation bilaterally, normal work of breathing GI: soft, nontender, nondistended, + BS MS: no deformity or atrophy Skin: warm and dry,  no rash Neuro:  Strength and sensation are intact, tremor is noted.  Psych: euthymic mood, full affect   EKG:  Sinus bradycardia rate of 58 bpm.Left axis deviation.   Recent Labs: 05/13/2018: ALT 20; TSH 4.418 05/14/2018: BUN 19; Creatinine, Ser 0.90; Hemoglobin 12.2; Platelets 211; Potassium 3.4; Sodium 142    Lipid Panel    Component Value Date/Time   CHOL 211 (H) 05/14/2018 0219   TRIG 61 05/14/2018 0219   HDL 53 05/14/2018 0219   CHOLHDL 4.0 05/14/2018 0219   VLDL 12 05/14/2018 0219   LDLCALC 146 (H) 05/14/2018 0219      Wt Readings  from Last 3 Encounters:  05/28/18 128 lb 3.2 oz (58.2 kg)  05/15/18 128 lb (58.1 kg)  05/14/18 124 lb 3.2 oz (56.3 kg)      Other studies Reviewed: Echo  05/14/18 1. The left ventricle has normal systolic function, with an ejection fraction of 55-60%. The cavity size was mildly decreased. Left ventricular diastolic Doppler parameters are consistent with impaired relaxation The E/e' is 26.6. No evidence of left  ventricular regional wall motion abnormalities. 2. No evidence of left ventricular regional wall motion abnormalities. 3. The right ventricle has normal systolic function. The cavity was normal. There is no increase in right ventricular wall thickness. 4. The mitral valve is normal in structure. Mild calcification of the mitral valve leaflet. There is moderate mitral annular calcification present. Mitral valve regurgitation is mild to moderate by color flow Doppler. 5. The tricuspid valve is normal in structure. 6. The aortic valve is tricuspid Mild calcification of the aortic valve. 7. The pulmonic valve was normal in structure.  ASSESSMENT AND PLAN:  1. PAF: She is in sinus bradycardia now. She is currently asymptomatic but did have some dizziness initially when starting the metoprolol. I have advised her to let us know if she experiences dizziness again, or near syncope. She verbalizes understanding. She will continue on Eliquis  until being seen again in 3 months for follow up at which time need to continue will be discussed.   2. Hypertension: She was elevated when she first came in, but recheck of BP did show improvement. She has just taken her medications and therefore I expect her BP will be lower once she returns home. The staff at [facility] check her BP daily and have been checking her temperature each day during the COVID-19 virus outbreak.    Current medicines are reviewed at length with the patient today.    Labs/ tests ordered today include:None    Phill Myron. [place] Pugh, ANP, [facility]   05/28/2018 9:24 AM    [facility] [place] [address] Office [phone] Fax [phone]

## 2018-05-28 ENCOUNTER — Encounter: Payer: Self-pay | Admitting: Adult Health

## 2018-05-28 ENCOUNTER — Other Ambulatory Visit: Payer: Self-pay

## 2018-05-28 ENCOUNTER — Ambulatory Visit (INDEPENDENT_AMBULATORY_CARE_PROVIDER_SITE_OTHER): Payer: Medicare Other | Admitting: Adult Health

## 2018-05-28 VITALS — BP 158/70 | HR 62 | Ht 60.0 in | Wt 128.2 lb

## 2018-05-28 DIAGNOSIS — I10 Essential (primary) hypertension: Secondary | ICD-10-CM | POA: Diagnosis not present

## 2018-05-28 DIAGNOSIS — I480 Paroxysmal atrial fibrillation: Secondary | ICD-10-CM

## 2018-05-28 NOTE — Patient Instructions (Signed)
Follow-Up: You will need a follow up appointment in 3 months WITH Elouise Munroe, MD or one of the following Advanced Practice Providers on your designated Care Team:  Rosaria Ferries, PA-C  Jory Sims, DNP, AACC  Medication Instructions:  NO CHANGES- Your physician recommends that you continue on your current medications as directed. Please refer to the Current Medication list given to you today. If you need a refill on your cardiac medications before your next appointment, please call your pharmacy. Labwork: When you have labs (blood work) and your tests are completely normal, you will receive your results ONLY by [facility] (if you have MyChart) -OR- A paper copy in the mail.  At [facility], you and your health needs are our priority.  As part of our continuing mission to provide you with exceptional heart care, we have created designated Provider Care Teams.  These Care Teams include your primary Cardiologist (physician) and Advanced Practice Providers (APPs -  Physician Assistants and Nurse Practitioners) who all work together to provide you with the care you need, when you need it.  Thank you for choosing [facility] at [facility]!!

## 2018-05-28 NOTE — Addendum Note (Signed)
Addended by: Waylan Rocher on: 05/28/2018 11:13 AM   Modules accepted: Orders

## 2018-05-31 ENCOUNTER — Telehealth: Payer: Self-pay | Admitting: *Deleted

## 2018-05-31 NOTE — Telephone Encounter (Signed)
Patient called and stated that she wants Mliss Sax to take care/over her medication. She stated that Mliss Sax will need a note from you to ok this. Please Advise.

## 2018-05-31 NOTE — Telephone Encounter (Signed)
Let's give Christina Lawson a verbal order for this at this time.

## 2018-05-31 NOTE — Telephone Encounter (Signed)
Spoke to [place] and advised results, med list faxed to [employer]

## 2018-06-04 ENCOUNTER — Other Ambulatory Visit: Payer: Self-pay | Admitting: Internal Medicine

## 2018-06-04 DIAGNOSIS — F419 Anxiety disorder, unspecified: Secondary | ICD-10-CM

## 2018-06-04 DIAGNOSIS — I4891 Unspecified atrial fibrillation: Secondary | ICD-10-CM

## 2018-06-04 MED ORDER — APIXABAN 2.5 MG PO TABS: 2.5000 mg | ORAL_TABLET | Freq: Two times a day (BID) | ORAL | 11 refills | Status: DC

## 2018-06-04 MED ORDER — METOPROLOL SUCCINATE ER 25 MG PO TB24: 12.5000 mg | ORAL_TABLET | Freq: Every day | ORAL | 11 refills | Status: DC

## 2018-06-04 MED ORDER — SERTRALINE HCL 25 MG PO TABS: 25.0000 mg | ORAL_TABLET | Freq: Every day | ORAL | 11 refills | Status: DC

## 2018-06-04 NOTE — Progress Notes (Signed)
Per clinic nurse, pt needed refills on eliquis, toprol, zoloft and will now be using medication mgt through clinic nurse and [employer].  Refills sent and minor changes in her colace and tums were updated in her med list.    Christina Lawson, D.O. [facility] [address], [place] [address] Cell Phone (Mon-Fri 8am-5pm):  [phone] On Call:  [phone] & follow prompts after 5pm & weekends Office Phone:  [phone] Office Fax:  [phone]

## 2018-06-07 ENCOUNTER — Telehealth: Payer: Self-pay | Admitting: *Deleted

## 2018-06-07 NOTE — Telephone Encounter (Signed)
Spoke with patient and she uses diclofenac on her knee, med list updated, form faxed and scanned to [employer]

## 2018-06-10 ENCOUNTER — Telehealth: Payer: Self-pay | Admitting: Allergy & Immunology

## 2018-06-10 MED ORDER — AZELASTINE HCL 0.15 % NA SOLN: NASAL | 5 refills | Status: DC

## 2018-06-10 MED ORDER — FLUTICASONE PROPIONATE 50 MCG/ACT NA SUSP: 2.0000 | Freq: Every day | NASAL | 5 refills | Status: DC

## 2018-06-10 MED ORDER — FLUTICASONE FUROATE-VILANTEROL 100-25 MCG/INH IN AEPB: INHALATION_SPRAY | RESPIRATORY_TRACT | 1 refills | Status: DC

## 2018-06-10 NOTE — Telephone Encounter (Signed)
Mliss Sax, from [facility], called to get refills for [facility]. She has changed pharmacies. She now uses [facility] [phone]. She needs refills for Breo Ellipta, Azelastine, Fluticasone? sp

## 2018-06-10 NOTE — Telephone Encounter (Signed)
Medications sent

## 2018-07-30 ENCOUNTER — Telehealth: Payer: Self-pay

## 2018-07-30 NOTE — Telephone Encounter (Signed)
I was able to reach the pt in regards to her 07/31/18 appt. Pt was advised due to current COVID 19 pandemic, our office is severely reducing in office visits until further notice, in order to minimize the risk to our patients and healthcare providers.  Pt was advised since she within the high risk category for covid 19 we could do a virtual video visit or a telephone visit. Pt consented to a telephone visit   Pt understands that although there may be some limitations with this type of visit, we will take all precautions to reduce any security or privacy concerns.  Pt understands that this will be treated like an in office visit and we will file with pt's insurance, and there may be a patient responsible charge related to this service.  Best call back is [phone]. EMR has been updated for visit.

## 2018-07-31 ENCOUNTER — Other Ambulatory Visit: Payer: Self-pay

## 2018-07-31 ENCOUNTER — Encounter: Payer: Self-pay | Admitting: Neurology

## 2018-07-31 ENCOUNTER — Ambulatory Visit (INDEPENDENT_AMBULATORY_CARE_PROVIDER_SITE_OTHER): Payer: Medicare Other | Admitting: Neurology

## 2018-07-31 DIAGNOSIS — G250 Essential tremor: Secondary | ICD-10-CM | POA: Diagnosis not present

## 2018-07-31 NOTE — Progress Notes (Signed)
     Virtual Visit via Telephone Note  I connected with Christina Lawson on 07/31/18 at 10:30 AM EDT by telephone and verified that I am speaking with the correct person using two identifiers.  Location: Patient: The patient is at home, [facility]. Provider: Physician in office.   I discussed the limitations, risks, security and privacy concerns of performing an evaluation and management service by telephone and the availability of in person appointments. I also discussed with the patient that there may be a patient responsible charge related to this service. The patient expressed understanding and agreed to proceed.   History of Present Illness: Christina Lawson is a 85 year old right-handed white female with a history of an essential tremor.  The patient lives at [place], she has recently been in the hospital with chest pressure.  She was admitted on 13 May 2018 after she felt weak and short winded.  She was found to have atrial fibrillation with rapid ventricular response.  Her troponin levels topped out over 7, the patient was not felt to have had a myocardial infarction, she was felt to have had some heart strain related to the elevated heart rate.  The patient has been placed on metoprolol which concurrently has helped her tremor.  She has no longer taking alprazolam.  The patient notes that when she is under stress, the tremor is worse.  The patient does have some gait instability, she does not use a cane or a walker for ambulation.  She does have a cane, however.  She has not had any falls.  She is trying to stay active, she is exercising on a regular basis.  She reports no other new medical issues that have come up since last seen.  She is now on anticoagulation, off of aspirin.   Observations/Objective: On the telephone, the patient appears to be alert and cooperative, she is answering questions appropriately.  Speech is well enunciated, not aphasic.  Assessment and Plan: 1.   Essential tremor  2.  New onset atrial fibrillation  The metoprolol use for the atrial fibrillation for rate control is helping her tremor.  She will continue on this medication.  She will follow-up through this office in about 1 year, sooner if needed.  We are no longer giving her any medications.  She indicates that she is able to feed herself and perform handwriting with the tremor.  Follow Up Instructions: 1 year follow-up, may see nurse practitioner.   I discussed the assessment and treatment plan with the patient. The patient was provided an opportunity to ask questions and all were answered. The patient agreed with the plan and demonstrated an understanding of the instructions.   The patient was advised to call back or seek an in-person evaluation if the symptoms worsen or if the condition fails to improve as anticipated.  I provided 15 minutes of non-face-to-face time during this encounter.   Kathrynn Ducking, MD

## 2018-08-21 ENCOUNTER — Other Ambulatory Visit: Payer: Self-pay

## 2018-08-21 ENCOUNTER — Encounter: Payer: Self-pay | Admitting: Internal Medicine

## 2018-08-21 ENCOUNTER — Non-Acute Institutional Stay: Payer: Medicare Other | Admitting: Internal Medicine

## 2018-08-21 VITALS — BP 138/70 | HR 57 | Temp 98.0°F | Ht 60.0 in | Wt 131.0 lb

## 2018-08-21 DIAGNOSIS — I480 Paroxysmal atrial fibrillation: Secondary | ICD-10-CM | POA: Diagnosis not present

## 2018-08-21 DIAGNOSIS — G20C Parkinsonism, unspecified: Secondary | ICD-10-CM

## 2018-08-21 DIAGNOSIS — E7800 Pure hypercholesterolemia, unspecified: Secondary | ICD-10-CM | POA: Diagnosis not present

## 2018-08-21 DIAGNOSIS — J4540 Moderate persistent asthma, uncomplicated: Secondary | ICD-10-CM

## 2018-08-21 DIAGNOSIS — R200 Anesthesia of skin: Secondary | ICD-10-CM

## 2018-08-21 DIAGNOSIS — I10 Essential (primary) hypertension: Secondary | ICD-10-CM

## 2018-08-21 DIAGNOSIS — G20 Parkinson's disease: Secondary | ICD-10-CM

## 2018-08-21 NOTE — Patient Instructions (Signed)
Before we meet next, we will check your B12 level with your other labs due to your blood counts trending down and the numb area on your left knee.

## 2018-08-21 NOTE — Progress Notes (Signed)
Location:  Occupational psychologist of Service:  Clinic (12)  Provider: Aymar Whitfill L. Mariea Clonts, D.O., C.M.D.  Code Status: DNR, MOST Goals of Care:  Advanced Directives 05/13/2018  Does Patient Have a Medical Advance Directive? Yes  Type of Advance Directive Out of facility DNR (pink MOST or yellow form);[place];Living will  Does patient want to make changes to medical advance directive? No - Patient declined  Copy of [place] in Chart? Yes - validated most recent copy scanned in chart (See row information)  Pre-existing out of facility DNR order (yellow form or pink MOST form) Yellow form placed in chart (order not valid for inpatient use)     Chief Complaint  Patient presents with  . Medical Management of Chronic Issues    1mth follow-up    HPI: Patient is a 85 y.o. female seen today for medical management of chronic diseases.    She would lie to be able to do more indpeendent things.  Says it is working out.  She's read a lot of books and seen a lot of tv.  She's talked a lot on the phone.  She saw her grandchildren through zoom.    She fell and it upset her.  She did not break anything.  She bumped into the wall going to the bathroom in the middle of the night.  She had taken her pain pill before that.  She thinks it's too much for her.  She has not sued more since.  She is managing.    She is doing [employer] physical exercise every morning.    She sees allergy doctor in August.    She had a phone visit with Dr. Jannifer Franklin.  Her left knee feels funny.  She says it's not pain.  It is numb sometimes--just on the knee.  She had a prior torn meniscus that she never had surgery for it.  She used to go to the pool and cannot now.  No difficulty in her thigh.  Sometimes the feeling goes down into the foot.      She does not get chest pain.    She eats red meat once a week.  Likes fish.  Sometimes after she eats red meat, there is some  tightness in her chest.  No difficulty with chewing or swallowing the meat.    She says she has more energy than she did.  Says this is since the eliquis and toprol.    Eyes are not doing great.  Still gets discharge.  She has a new place under her right eye that's on the skin that bothers her.  Has vision test this summer. Uses ocuvite, systane, preservision, hot compresses in shower and still there.  Discussed baby shampoo to help also.    Says she takes a lot of time taking care of her body and it doesn't give her the pleasure she wants in return.    Past Medical History:  Diagnosis Date  . Allergic rhinitis    pollen and grass  . Asthma   . Atrial fibrillation ([place]) 05/13/2018  . Blepharitis   . Carpal tunnel syndrome, bilateral 07/03/2016  . Family history of adverse reaction to anesthesia    " [place]   . Hyperlipidemia   . Irregular heart rhythm   . Macular degeneration 2012  . Osteoporosis   . Sensorineural hearing loss, bilateral 2007  . Shingles 2004  .  Skin cancer 2005   nose  . Torn meniscus 2006   right knee  . Tremor 2006   familiar.  dx by Dr. Quillian Quince  . Unspecified essential hypertension   . Unspecified hypothyroidism   . Urinary frequency     Past Surgical History:  Procedure Laterality Date  . ABDOMINAL HYSTERECTOMY  1977  . CATARACT EXTRACTION  1974   Left, IOL implants  . CATARACT EXTRACTION Right 1979   IOL implants  . CHOLECYSTECTOMY  1981  . COSMETIC SURGERY  1995   eye  . [place]  . DILATION AND CURETTAGE OF UTERUS  1977  . [place]  . HIATAL HERNIA REPAIR  1977  . SKIN CANCER EXCISION  2005   nose  . THYROIDECTOMY  1955  . TONSILLECTOMY  1931    Allergies  Allergen Reactions  . Clindamycin/Lincomycin Diarrhea    diarrhea  . Hemorrhoid Preparation [Diperodon] Other (See Comments)    Listed on paper work  . Ibuprofen Other (See Comments)    Long time  ago/ list on paper work  . Penicillins Other (See Comments)    Did it involve swelling of the face/tongue/throat, SOB, or low BP? Unknown Did it involve sudden or severe rash/hives, skin peeling, or any reaction on the inside of your mouth or nose? Unknown Did you need to seek medical attention at a hospital or doctor's office? Unknown When did it last happen? If all above answers are "NO", may proceed with cephalosporin use.    Outpatient Encounter Medications as of 08/21/2018  Medication Sig  . albuterol (PROVENTIL HFA;VENTOLIN HFA) 108 (90 Base) MCG/ACT inhaler Inhale 1 puff into the lungs every 6 (six) hours as needed for wheezing.   Marland Kitchen apixaban (ELIQUIS) 2.5 MG TABS tablet Take 1 tablet (2.5 mg total) by mouth 2 (two) times daily.  . Azelastine HCl 0.15 % SOLN USE 1 SPRAY NASALLY TWICE A DAY  . calcium carbonate (TUMS - DOSED IN MG ELEMENTAL CALCIUM) 500 MG chewable tablet Chew 1 tablet by mouth daily as needed for indigestion.  . Cholecalciferol (VITAMIN D) 2000 units CAPS Take 1 capsule (2,000 Units total) by mouth daily.  . Diclofenac Sodium 1 % CREA Place 1 application onto the skin as needed.  . docusate sodium (COLACE) 100 MG capsule Take 100 mg by mouth daily.    . fluocinonide (LIDEX) 0.05 % external solution Apply 1 application topically 2 (two) times daily as needed (hair).   . fluticasone (FLONASE) 50 MCG/ACT nasal spray Place 2 sprays into both nostrils daily.  . fluticasone furoate-vilanterol (BREO ELLIPTA) 100-25 MCG/INH AEPB USE 1 INHALATION DAILY  . HYDROcodone-acetaminophen (NORCO) 5-325 MG tablet Take 1 tablet by mouth 3 (three) times daily as needed for severe pain.  Marland Kitchen loratadine (CLARITIN) 10 MG tablet Take 10 mg by mouth daily.   . metoprolol succinate (TOPROL-XL) 25 MG 24 hr tablet Take 0.5 tablets (12.5 mg total) by mouth daily.  . minoxidil (ROGAINE) 2 % external solution Apply topically daily. To regrow hair  . Multiple Vitamins-Minerals (PRESERVISION AREDS  PO) Take 1 tablet by mouth 2 (two) times daily.  . Saline (SIMPLY SALINE) 0.9 % AERS Place 1 spray into the nose daily.   . sertraline (ZOLOFT) 25 MG tablet Take 1 tablet (25 mg total) by mouth daily.   No facility-administered encounter medications on file as of 08/21/2018.     Review of Systems:  Review of Systems  Constitutional: Negative for chills, fever and  malaise/fatigue.  HENT: Positive for hearing loss. Negative for congestion.        Hearing aids  Eyes: Positive for discharge. Negative for blurred vision and pain.  Respiratory: Negative for cough and shortness of breath.   Cardiovascular: Negative for chest pain, palpitations and leg swelling.  Gastrointestinal: Negative for abdominal pain.  Genitourinary: Negative for dysuria, frequency and urgency.  Musculoskeletal: Positive for falls. Negative for joint pain.  Skin: Negative for itching and rash.       New papule beneath right eye  Neurological: Positive for sensory change. Negative for dizziness, loss of consciousness and headaches.       Loss of sensation over left knee medially  Endo/Heme/Allergies: Positive for environmental allergies. Bruises/bleeds easily.  Psychiatric/Behavioral: Positive for memory loss. Negative for depression. The patient is nervous/anxious. The patient does not have insomnia.     Health Maintenance  Topic Date Due  . INFLUENZA VACCINE  10/12/2018  . TETANUS/TDAP  04/07/2025  . DEXA SCAN  Completed  . PNA vac Low Risk Adult  Completed    Physical Exam: Vitals:   08/21/18 0954  BP: 138/70  Pulse: (!) 57  Temp: 98 F (36.7 C)  TempSrc: Oral  SpO2: 98%  Weight: 131 lb (59.4 kg)  Height: 5' (1.524 m)   Body mass index is 25.58 kg/m. Physical Exam Vitals signs reviewed.  Constitutional:      General: She is not in acute distress.    Appearance: Normal appearance. She is normal weight. She is not toxic-appearing.  HENT:     Head: Normocephalic and atraumatic.  Eyes:      Comments: Small papule beneath right eye, skin-colored; erythema of eyes with some loss of lids to keep drainage confined; wears glasses  Cardiovascular:     Rate and Rhythm: Normal rate and regular rhythm.     Pulses: Normal pulses.     Heart sounds: Normal heart sounds.  Pulmonary:     Effort: Pulmonary effort is normal.     Breath sounds: Normal breath sounds.  Abdominal:     General: Bowel sounds are normal.  Musculoskeletal: Normal range of motion.  Skin:    General: Skin is warm and dry.  Neurological:     Mental Status: She is alert. Mental status is at baseline.     Cranial Nerves: No cranial nerve deficit.     Motor: No weakness.     Comments: Loss of sensation over left medial upper knee; remainder of leg intact, no weakness; tremor present  Psychiatric:        Mood and Affect: Mood normal.     Labs reviewed: Basic Metabolic Panel: Recent Labs    01/24/18 1401 04/09/18 05/13/18 1042 05/13/18 1321 05/14/18 0219  NA 140 143 140  --  142  K 4.3 5.2 3.8  --  3.4*  CL 104  --  105  --  108  CO2 29  --  22  --  22  GLUCOSE 94  --  185*  --  97  BUN 15 14 15   --  19  CREATININE 0.79 0.7 1.11*  --  0.90  CALCIUM 9.6  --  9.9  --  9.0  TSH  --   --   --  4.418  --    Liver Function Tests: Recent Labs    10/02/17 0600 01/24/18 1401 05/13/18 1042  AST 23 20 28   ALT 18 19 20   ALKPHOS 66  --  58  BILITOT  --  0.6 1.2  PROT  --  6.9 7.3  ALBUMIN  --   --  4.0   No results for input(s): LIPASE, AMYLASE in the last 8760 hours. No results for input(s): AMMONIA in the last 8760 hours. CBC: Recent Labs    01/24/18 1401 05/13/18 1042 05/14/18 0219  WBC 7.1 8.7 7.9  NEUTROABS 4,629 6.8  --   HGB 13.9 14.6 12.2  HCT 41.8 45.1 36.4  MCV 94.6 97.8 94.1  PLT 262 246 211   Lipid Panel: Recent Labs    10/02/17 0600 04/09/18 05/14/18 0219  CHOL 230* 220* 211*  HDL 63 64 53  LDLCALC 144 137 146*  TRIG 116 96 61  CHOLHDL  --   --  4.0   Lab Results    Component Value Date   HGBA1C 5.9 10/02/2017     Assessment/Plan 1. Parkinsonism, unspecified Parkinsonism type ([place]) -continues to follow with Dr. Jannifer Franklin -no new symptoms  2. Pure hypercholesterolemia -managed with diet and exercise, last LDL had trended up a bit and she was working on changing diet some; she has opted not to take statin medications and risks may outweigh benefits at 92  3. Loss, sensation -over left knee -?due to prior meniscal injury without repair impacting nerve?  Not clear cause -will check b12 due to her decreasing h/h and her neuropathic symptoms--also eats minimal red meat  4. Essential hypertension -bp at goal with current therapy, no dizziness, no change  5. Paroxysmal atrial fibrillation (HCC) -rate controlled and tolerating eliquis therapy -reports feeling better since this is treated now with more energy  6. Moderate persistent asthma without complication -doing well with breo and not needing rescue inhaler -continues on flonase and claritin for allergies  Reviewed meds again today and could not come up with any we could safely stop to decrease pill burden. Getting med mgt through [facility] nurses and [facility]--medications reconciled.  Labs/tests ordered:  Cbc, cmp, b12 before next visit Next appt:  12/25/2018  Tene Gato L. Lashon Hillier, D.O. [facility] [address], [place] [address] Cell Phone (Mon-Fri 8am-5pm):  [phone] On Call:  [phone] & follow prompts after 5pm & weekends Office Phone:  [phone] Office Fax:  [phone]

## 2018-08-26 ENCOUNTER — Telehealth: Payer: Self-pay | Admitting: Internal Medicine

## 2018-08-26 NOTE — Telephone Encounter (Signed)
home phone/ consent/ my chart/ pre reg completed °

## 2018-08-29 ENCOUNTER — Telehealth: Payer: Self-pay | Admitting: *Deleted

## 2018-08-29 ENCOUNTER — Telehealth (INDEPENDENT_AMBULATORY_CARE_PROVIDER_SITE_OTHER): Payer: Medicare Other | Admitting: Internal Medicine

## 2018-08-29 ENCOUNTER — Encounter: Payer: Self-pay | Admitting: Internal Medicine

## 2018-08-29 VITALS — BP 140/80 | HR 62 | Ht 60.0 in | Wt 130.0 lb

## 2018-08-29 DIAGNOSIS — I480 Paroxysmal atrial fibrillation: Secondary | ICD-10-CM | POA: Diagnosis not present

## 2018-08-29 DIAGNOSIS — R55 Syncope and collapse: Secondary | ICD-10-CM | POA: Diagnosis not present

## 2018-08-29 DIAGNOSIS — I10 Essential (primary) hypertension: Secondary | ICD-10-CM

## 2018-08-29 NOTE — Patient Instructions (Addendum)
   Medication Instructions:   NO CHANGES   If you need a refill on your cardiac medications before your next appointment, please call your pharmacy.   Lab work:  NOT NEEDED    Testing/Procedures: NOT NEEDED  Follow-Up: At [facility], you and your health needs are our priority.  As part of our continuing mission to provide you with exceptional heart care, we have created designated Provider Care Teams.  These Care Teams include your primary Cardiologist (physician) and Advanced Practice Providers (APPs -  Physician Assistants and Nurse Practitioners) who all work together to provide you with the care you need, when you need it. . You will need a follow up appointment in 6   Months -Christina Lawson.  Please call our office 2 months in advance to schedule this appointment.  You may see Christina Munroe, MD*or one of the following Advanced Practice Providers on your designated Care Team:   . Christina Ferries, PA-C . Christina Sims, DNP, ANP  Any Other Special Instructions Will Be Listed Below (If Applicable).

## 2018-08-29 NOTE — Telephone Encounter (Signed)
Spoke to patient - instruction given from tele-visit - avs summary will be sent via mychart Patient verbalized understanding

## 2018-08-29 NOTE — Progress Notes (Signed)
Virtual Visit via Telephone Note   This visit type was conducted due to national recommendations for restrictions regarding the COVID-19 Pandemic (e.g. social distancing) in an effort to limit this patient's exposure and mitigate transmission in our community.  Due to her co-morbid illnesses, this patient is at least at moderate risk for complications without adequate follow up.  This format is felt to be most appropriate for this patient at this time.  The patient did not have access to video technology/had technical difficulties with video requiring transitioning to audio format only (telephone).  All issues noted in this document were discussed and addressed.  No physical exam could be performed with this format.  Please refer to the patient's chart for her  consent to telehealth for [facility]   Date:  08/29/2018   ID:  Christina Lawson, DOB [birth year], MRN [id number]  Patient Location: Home Provider Location: Office  PCP:  Gayland Curry, DO  Cardiologist:  Elouise Munroe, MD   Electrophysiologist:  None   Evaluation Performed:  Follow-Up Visit  Chief Complaint:  Follow up   History of Present Illness:    Christina Lawson is a 85 y.o. female with a hx of hypertension, hypothyroidism, tremor, hyperlipidemia, prior TIA, recent syncope. She was recently hospitalized for chest discomfort and rapid atrial fibrillation which was new onset. She converted to sinus with IV diltiazem. She declined further testing. She was started on apixaban and metoprolol.  She continues to feel well and has no acute concerns. She has tried to remain active despite COVID 19 restrictions. The patient denies chest pain, chest pressure, dyspnea at rest or with exertion, palpitations, PND, orthopnea, or leg swelling. Denies recurrent syncope or presyncope. Denies dizziness or lightheadedness. Denies snoring.  The patient does not have symptoms concerning for COVID-19 infection (fever, chills, cough, or  new shortness of breath).    Past Medical History:  Diagnosis Date   Allergic rhinitis    pollen and grass   Asthma    Atrial fibrillation ([place]) 05/13/2018   Blepharitis    Carpal tunnel syndrome, bilateral 07/03/2016   Family history of adverse reaction to anesthesia    " [place] (85YR OLD ) DIED UNDER ANESTHESTIA    Hyperlipidemia    Irregular heart rhythm    Macular degeneration 2012   Osteoporosis    Sensorineural hearing loss, bilateral 2007   Shingles 2004   Skin cancer 2005   nose   Torn meniscus 2006   right knee   Tremor 2006   familiar.  dx by Dr. Quillian Quince   Unspecified essential hypertension    Unspecified hypothyroidism    Urinary frequency    Past Surgical History:  Procedure Laterality Date   ABDOMINAL HYSTERECTOMY  1977   CATARACT EXTRACTION  1974   Left, IOL implants   CATARACT EXTRACTION Right 1979   IOL implants   [place]   eye   [place]   SKIN CANCER EXCISION  2005   nose   THYROIDECTOMY  1955   TONSILLECTOMY  1931     Current Meds  Medication Sig   albuterol (PROVENTIL HFA;VENTOLIN HFA) 108 (90 Base) MCG/ACT inhaler Inhale 1 puff into the lungs every 6 (six) hours as needed for wheezing.    apixaban (ELIQUIS) 2.5 MG TABS tablet Take  1 tablet (2.5 mg total) by mouth 2 (two) times daily.   Azelastine HCl 0.15 % SOLN USE 1 SPRAY NASALLY TWICE A DAY   calcium carbonate (TUMS - DOSED IN MG ELEMENTAL CALCIUM) 500 MG chewable tablet Chew 1 tablet by mouth daily as needed for indigestion.   Cholecalciferol (VITAMIN D) 2000 units CAPS Take 1 capsule (2,000 Units total) by mouth daily.   Diclofenac Sodium 1 % CREA Place 1 application onto the skin as needed.   docusate sodium (COLACE) 100 MG capsule Take 100 mg by mouth daily.     fluticasone  (FLONASE) 50 MCG/ACT nasal spray Place 2 sprays into both nostrils daily.   fluticasone furoate-vilanterol (BREO ELLIPTA) 100-25 MCG/INH AEPB USE 1 INHALATION DAILY   HYDROcodone-acetaminophen (NORCO) 5-325 MG tablet Take 1 tablet by mouth 3 (three) times daily as needed for severe pain.   loratadine (CLARITIN) 10 MG tablet Take 10 mg by mouth daily.    metoprolol succinate (TOPROL-XL) 25 MG 24 hr tablet Take 0.5 tablets (12.5 mg total) by mouth daily.   minoxidil (ROGAINE) 2 % external solution Apply topically daily. To regrow hair   Multiple Vitamins-Minerals (PRESERVISION AREDS PO) Take 1 tablet by mouth 2 (two) times daily.   Saline (SIMPLY SALINE) 0.9 % AERS Place 1 spray into the nose daily.    sertraline (ZOLOFT) 25 MG tablet Take 1 tablet (25 mg total) by mouth daily.     Allergies:   Clindamycin/lincomycin, Hemorrhoid preparation [diperodon], Ibuprofen, and Penicillins   Social History   Tobacco Use   Smoking status: Never Smoker   Smokeless tobacco: Never Used  Substance Use Topics   Alcohol use: Yes    Comment: on occ.   Drug use: No     Family Hx: The patient's family history includes Allergies in her father; Asthma in her father; Brain cancer in her mother; Breast cancer in her cousin, cousin, maternal aunt, maternal aunt, paternal aunt, and sister; Breast cancer (age of onset: 49) in her sister; Breast cancer (age of onset: 56) in her daughter; Cancer in her mother, sister, and sister; Emphysema in her father; Endometrial cancer in her sister; Heart disease in her maternal grandfather; Hypertension in her mother; Stroke in an other family member.  ROS:   Please see the history of present illness.     All other systems reviewed and are negative.   Prior CV studies:   The following studies were reviewed today:    Labs/Other Tests and Data Reviewed:    EKG:  No ECG reviewed.  Recent Labs: 05/13/2018: ALT 20; TSH 4.418 05/14/2018: BUN 19; Creatinine, Ser  0.90; Hemoglobin 12.2; Platelets 211; Potassium 3.4; Sodium 142   Recent Lipid Panel Lab Results  Component Value Date/Time   CHOL 211 (H) 05/14/2018 02:19 AM   TRIG 61 05/14/2018 02:19 AM   HDL 53 05/14/2018 02:19 AM   CHOLHDL 4.0 05/14/2018 02:19 AM   LDLCALC 146 (H) 05/14/2018 02:19 AM    Wt Readings from Last 3 Encounters:  08/29/18 130 lb (59 kg)  08/21/18 131 lb (59.4 kg)  05/28/18 128 lb 3.2 oz (58.2 kg)     Objective:    Vital Signs:  BP 140/80    Pulse 62    Ht 5' (1.524 m)    Wt 130 lb (59 kg)    SpO2 98%    BMI 25.39 kg/m    VITAL SIGNS:  reviewed GEN:  no acute distress  ASSESSMENT & PLAN:    1.  Paroxysmal atrial fibrillation (HCC)   2. Syncope, unspecified syncope type   3. Essential hypertension    She is overall feeling well and again would like to defer any elective testing. No bleeding complications or recurrent syncope. Will continue to monitor, and we discussed indications to contact our office with concerns or questions.   COVID-19 Education: The signs and symptoms of COVID-19 were discussed with the patient and how to seek care for testing (follow up with PCP or arrange E-visit).  The importance of social distancing was discussed today.  Time:   Today, I have spent 15 minutes with the patient with telehealth technology discussing the above problems.     Medication Adjustments/Labs and Tests Ordered: Current medicines are reviewed at length with the patient today.  Concerns regarding medicines are outlined above.   Tests Ordered: No orders of the defined types were placed in this encounter.   Medication Changes: No orders of the defined types were placed in this encounter.   Follow Up:  Virtual Visit or In Person in 6 month(s)  Signed, Elouise Munroe, MD  08/29/2018 10:56 AM    [place]

## 2018-09-30 DIAGNOSIS — H52203 Unspecified astigmatism, bilateral: Secondary | ICD-10-CM | POA: Diagnosis not present

## 2018-09-30 DIAGNOSIS — H02105 Unspecified ectropion of left lower eyelid: Secondary | ICD-10-CM | POA: Diagnosis not present

## 2018-09-30 DIAGNOSIS — H02102 Unspecified ectropion of right lower eyelid: Secondary | ICD-10-CM | POA: Diagnosis not present

## 2018-09-30 DIAGNOSIS — H1789 Other corneal scars and opacities: Secondary | ICD-10-CM | POA: Diagnosis not present

## 2018-10-01 DIAGNOSIS — L570 Actinic keratosis: Secondary | ICD-10-CM | POA: Diagnosis not present

## 2018-10-01 DIAGNOSIS — L853 Xerosis cutis: Secondary | ICD-10-CM | POA: Diagnosis not present

## 2018-10-01 DIAGNOSIS — L814 Other melanin hyperpigmentation: Secondary | ICD-10-CM | POA: Diagnosis not present

## 2018-10-01 DIAGNOSIS — L821 Other seborrheic keratosis: Secondary | ICD-10-CM | POA: Diagnosis not present

## 2018-10-01 DIAGNOSIS — Z85828 Personal history of other malignant neoplasm of skin: Secondary | ICD-10-CM | POA: Diagnosis not present

## 2018-10-31 ENCOUNTER — Ambulatory Visit (INDEPENDENT_AMBULATORY_CARE_PROVIDER_SITE_OTHER): Payer: Medicare Other | Admitting: Allergy & Immunology

## 2018-10-31 ENCOUNTER — Other Ambulatory Visit: Payer: Self-pay

## 2018-10-31 ENCOUNTER — Encounter: Payer: Self-pay | Admitting: Allergy & Immunology

## 2018-10-31 VITALS — BP 160/80 | HR 55 | Temp 97.3°F | Resp 16 | Ht 59.0 in | Wt 132.2 lb

## 2018-10-31 DIAGNOSIS — J4540 Moderate persistent asthma, uncomplicated: Secondary | ICD-10-CM

## 2018-10-31 DIAGNOSIS — J302 Other seasonal allergic rhinitis: Secondary | ICD-10-CM | POA: Diagnosis not present

## 2018-10-31 NOTE — Patient Instructions (Addendum)
1. Mild persistent asthma, uncomplicated - Lung testing looked fantastic today. - Continue with the Breo one puff once daily. - Continue with albuterol 2-4 puffs every 4-6 hours as needed.   2. Allergic rhinoconjunctivitis - Continue with fluticasone and azelastine: do one spray per nostril twice daily - Continue with Claritin (lortadine) as needed.   3. Return in about 6 months (around 05/03/2019).  Please inform us of any Emergency Department visits, hospitalizations, or changes in symptoms. Call us before going to the ED for breathing or allergy symptoms since we might be able to fit you in for a sick visit. Feel free to contact us anytime with any questions, problems, or concerns.  It was a pleasure to see you again today! I absolutely love seeing you! Hang in there!   Websites that have reliable patient information: 1. American Academy of Asthma, Allergy, and Immunology: www.aaaai.org 2. Food Allergy Research and Education (FARE): foodallergy.org 3. Mothers of Asthmatics: http://www.asthmacommunitynetwork.org 4. American College of Allergy, Asthma, and Immunology: www.acaai.org

## 2018-10-31 NOTE — Progress Notes (Signed)
FOLLOW UP  Date of Service/Encounter:  10/31/18   Assessment:   Moderate persistent asthma without complication   Seasonal allergic rhinitis  TIA (summer 2019)   Asthma Reportables: Severity:moderate persistent Risk:low Control:well controlled  Plan/Recommendations:   1. Mild persistent asthma, uncomplicated - Lung testing looked fantastic today. - Continue with the Breo one puff once daily. - Continue with albuterol 2-4 puffs every 4-6 hours as needed.   2. Allergic rhinoconjunctivitis - Continue with fluticasone and azelastine: do one spray per nostril twice daily - Continue with Claritin (lortadine) as needed.   3. Return in about 6 months (around 05/03/2019).  Subjective:   Christina Lawson is a 85 y.o. female presenting today for follow up of  Chief Complaint  Patient presents with  . Asthma  . Wheezing    last night before she went to bed, did not use anything, seemed to go away    Christina Lawson has a history of the following: Patient Active Problem List   Diagnosis Date Noted  . Paroxysmal atrial fibrillation ([place]) 08/21/2018  . NSTEMI (non-ST elevated myocardial infarction) ([place]) 05/15/2018  . Chest pain   . Atrial fibrillation with RVR ([place]) 05/13/2018  . Acquired hypothyroidism 05/13/2018  . Left thigh pain 10/10/2017  . Anxiety 10/10/2017  . H/O TIA (transient ischemic attack) and stroke 10/10/2017  . Moderate persistent asthma without complication 01/75/1025  . Parkinsonism ([place]) 04/11/2017  . Mild persistent asthma without complication 85/27/7824  . Seasonal allergic rhinitis 04/05/2017  . Carpal tunnel syndrome, bilateral 07/03/2016  . Numbness and tingling in both hands 06/05/2016  . Drooling 04/12/2016  . Cornea guttata 06/03/2015  . Posterior vitreous detachment of both eyes 06/03/2015  . Hyperglycemia 03/24/2015  . Senile osteopenia 03/24/2015  . 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 Murmur, cardiac 03/24/2015  .  Conjunctivitis 01/12/2014  . Edema 08/20/2013  . Senile ecchymosis 06/23/2013  . Rash and nonspecific skin eruption 03/26/2013  . Pain in left knee 01/06/2013  . Osteopenia 12/16/2012  . Urinary frequency 09/23/2012  . Impacted cerumen 09/23/2012  . Essential hypertension   . Hyperlipidemia   . Tremor, essential   . Nonexudative age-related macular degeneration 05/23/2012  . Status post intraocular lens implant 05/23/2012  . Cardiomegaly - hypertensive 08/10/2010  . Asthma, intrinsic 05/31/2010  . Dyspnea 05/31/2010    History obtained from: chart review and patient.  Christina Lawson is a 85 y.o. female presenting for a follow up visit.  She was last seen in February 2020.  At that time, her lung testing looked great.  We continued her Breo 1 puff once daily with albuterol as needed.  For her rhinitis, we continued with Flonase and Astelin along with Claritin as needed.  In the interim, she was admitted to the hospital for atrial fibrillation in March 2020.  She stayed overnight and was back to normal the next day.  Otherwise, since last visit, she has done very well.  They have remained isolated in the rooms.  However, they did recently open the pool back up.  They are allowed to be 10 people in the pool at one time and they have to make reservations.  They do have exercise at her facility, but they stream it over at the television.   Asthma/Respiratory Symptom History: She remains on Breo 1 puff once daily.  She does have albuterol to use as needed.  She does not remember the last time she needed albuterol.  She does sleep well at night.  She continues  to report that the [facility] is controlling her symptoms very well.  Allergic Rhinitis Symptom History: She remains on the fluticasone and azelastine.  She uses 1 of them in the morning and 1 of them at night.  She remains on the Claritin as well.  She has not needed any antibiotics at all since last visit.  Otherwise, there have been no changes to  her past medical history, surgical history, family history, or social history.  She is adorable as always.  Her daughter was able to recently, and see her, but they remained outside and 6 feet apart March.  Hopefully none of her residents at the assisted living facility have caught COVID, but a couple of the staff members have.    Review of Systems  Constitutional: Negative.  Negative for fever, malaise/fatigue and weight loss.  HENT: Negative.  Negative for congestion, ear discharge and ear pain.   Eyes: Negative for pain, discharge and redness.  Respiratory: Negative for cough, sputum production, shortness of breath and wheezing.   Cardiovascular: Negative.  Negative for chest pain and palpitations.  Gastrointestinal: Negative for abdominal pain, heartburn, nausea and vomiting.  Skin: Negative.  Negative for itching and rash.  Neurological: Negative for dizziness and headaches.  Endo/Heme/Allergies: Negative for environmental allergies. Does not bruise/bleed easily.       Objective:   Blood pressure (!) 160/80, pulse (!) 55, temperature (!) 97.3 F (36.3 C), temperature source Temporal, resp. rate 16, height 4\' 11"  (1.499 m), weight 132 lb 3.2 oz (60 kg), SpO2 98 %. Body mass index is 26.7 kg/m.   Physical Exam:  Physical Exam  Constitutional: She appears well-developed.  Talkative female.  HENT:  Head: Normocephalic and atraumatic.  Right Ear: Tympanic membrane, external ear and ear canal normal.  Left Ear: Tympanic membrane and ear canal normal.  Nose: No mucosal edema, rhinorrhea, nasal deformity or septal deviation. No epistaxis. Right sinus exhibits no maxillary sinus tenderness and no frontal sinus tenderness. Left sinus exhibits no maxillary sinus tenderness and no frontal sinus tenderness.  Mouth/Throat: Uvula is midline and oropharynx is clear and moist. Mucous membranes are not pale and not dry.  Hearing aids in place bilaterally.  Eyes: Pupils are equal, round, and  reactive to light. Conjunctivae and EOM are normal. Right eye exhibits no chemosis and no discharge. Left eye exhibits no chemosis and no discharge. Right conjunctiva is not injected. Left conjunctiva is not injected.  Cardiovascular: Normal rate, regular rhythm and normal heart sounds.  Respiratory: Effort normal and breath sounds normal. No accessory muscle usage. No tachypnea. No respiratory distress. She has no wheezes. She has no rhonchi. She has no rales. She exhibits no tenderness.  Lymphadenopathy:    She has no cervical adenopathy.  Neurological: She is alert.  Skin: No abrasion, no petechiae and no rash noted. Rash is not papular, not vesicular and not urticarial. No erythema. No pallor.  Psychiatric: She has a normal mood and affect.     Diagnostic studies:    Spirometry: results normal (FEV1: 1.22/116%, FVC: 1.44/99%, FEV1/FVC: 85%).    Spirometry consistent with normal pattern.   Allergy Studies: none        Salvatore Marvel, MD  [facility]

## 2018-11-05 ENCOUNTER — Other Ambulatory Visit: Payer: Self-pay | Admitting: Internal Medicine

## 2018-11-12 ENCOUNTER — Telehealth: Payer: Self-pay

## 2018-11-12 MED ORDER — BUDESONIDE-FORMOTEROL FUMARATE 160-4.5 MCG/ACT IN AERO: 2.0000 | INHALATION_SPRAY | Freq: Two times a day (BID) | RESPIRATORY_TRACT | 5 refills | Status: DC

## 2018-11-12 NOTE — Telephone Encounter (Signed)
Received notification from patient's pharmacy that [facility] was too costly for the patient. I spoke with Dr. Ernst Bowler and per his verbal instructions I sent in Symbicort 160-4.5 mcg 2 puffs 2 times daily.

## 2018-11-13 NOTE — Telephone Encounter (Signed)
Agree with the plan.   Salvatore Marvel, MD [facility]

## 2018-12-03 DIAGNOSIS — L570 Actinic keratosis: Secondary | ICD-10-CM | POA: Diagnosis not present

## 2018-12-03 DIAGNOSIS — L814 Other melanin hyperpigmentation: Secondary | ICD-10-CM | POA: Diagnosis not present

## 2018-12-03 DIAGNOSIS — L821 Other seborrheic keratosis: Secondary | ICD-10-CM | POA: Diagnosis not present

## 2018-12-03 DIAGNOSIS — Z85828 Personal history of other malignant neoplasm of skin: Secondary | ICD-10-CM | POA: Diagnosis not present

## 2018-12-03 DIAGNOSIS — L218 Other seborrheic dermatitis: Secondary | ICD-10-CM | POA: Diagnosis not present

## 2018-12-03 DIAGNOSIS — L853 Xerosis cutis: Secondary | ICD-10-CM | POA: Diagnosis not present

## 2018-12-16 ENCOUNTER — Other Ambulatory Visit: Payer: Self-pay | Admitting: Internal Medicine

## 2018-12-17 LAB — CBC AND DIFFERENTIAL
HCT: 39 (ref 36–46)
Hemoglobin: 39.4 — AB (ref 12.0–16.0)
Platelets: 207 (ref 150–399)
WBC: 4.6

## 2018-12-17 LAB — BASIC METABOLIC PANEL
BUN: 19 (ref 4–21)
Creatinine: 0.8 (ref <=1.1)
Glucose: 107
Potassium: 4.6 (ref 3.4–5.3)
Sodium: 141 (ref 137–147)

## 2018-12-17 LAB — HEPATIC FUNCTION PANEL
ALT: 20 (ref 7–35)
AST: 22 (ref 13–35)
Alkaline Phosphatase: 54 (ref 25–125)
Bilirubin, Total: 0.3

## 2018-12-18 ENCOUNTER — Other Ambulatory Visit: Payer: Self-pay | Admitting: *Deleted

## 2018-12-18 LAB — CHLORIDE
Albumin: 3.9
Calcium: 9.5
Carbon Dioxide, Total: 24
Chloride: 103
EGFR (Non-African Amer.): 65.7
Globulin: 2.5
Total Protein: 6.4 g/dL
Vitamin B12 Bind Capacity: 1064

## 2018-12-25 ENCOUNTER — Other Ambulatory Visit: Payer: Self-pay

## 2018-12-25 ENCOUNTER — Non-Acute Institutional Stay: Payer: Medicare Other | Admitting: Internal Medicine

## 2018-12-25 ENCOUNTER — Encounter: Payer: Self-pay | Admitting: Internal Medicine

## 2018-12-25 VITALS — BP 128/82 | HR 61 | Temp 98.4°F | Ht 59.0 in | Wt 133.0 lb

## 2018-12-25 DIAGNOSIS — R200 Anesthesia of skin: Secondary | ICD-10-CM

## 2018-12-25 DIAGNOSIS — G20 Parkinson's disease: Secondary | ICD-10-CM | POA: Diagnosis not present

## 2018-12-25 DIAGNOSIS — R413 Other amnesia: Secondary | ICD-10-CM

## 2018-12-25 DIAGNOSIS — I480 Paroxysmal atrial fibrillation: Secondary | ICD-10-CM | POA: Diagnosis not present

## 2018-12-25 DIAGNOSIS — Z66 Do not resuscitate: Secondary | ICD-10-CM

## 2018-12-25 DIAGNOSIS — G20C Parkinsonism, unspecified: Secondary | ICD-10-CM

## 2018-12-25 DIAGNOSIS — J45909 Unspecified asthma, uncomplicated: Secondary | ICD-10-CM

## 2018-12-25 DIAGNOSIS — F419 Anxiety disorder, unspecified: Secondary | ICD-10-CM

## 2018-12-25 NOTE — Progress Notes (Signed)
Location:  Occupational psychologist of Service:  Clinic (12)  Provider: Rolly Magri L. Mariea Clonts, D.O., C.M.D.  Code Status: DNR Goals of Care:  Advanced Directives 05/13/2018  Does Patient Have a Medical Advance Directive? Yes  Type of Advance Directive Out of facility DNR (pink MOST or yellow form);[place];Living will  Does patient want to make changes to medical advance directive? No - Patient declined  Copy of [place] in Chart? Yes - validated most recent copy scanned in chart (See row information)  Pre-existing out of facility DNR order (yellow form or pink MOST form) Yellow form placed in chart (order not valid for inpatient use)     Chief Complaint  Patient presents with  . Medical Management of Chronic Issues    4 Month Follow up    HPI: Patient is a 85 y.o. female seen today for medical management of chronic diseases.    Her tremor is worse.  Thinks the alprazolam helped her anxiety and tremor more than taking zoloft.  She is anxious at times.  She is beginning to think she should move to assisted living.  She feels like she's falling apart.  She's doing her laundry, managing her food.  She hates to give up her apt and go to something smaller.  She admits to not being as sharp and forgetting some.  [place].  Her children are supportive of an AL move, but they did not think she was ready.  Her eyes have allowed her to read here lately b/c she'd been doing audiobooks pre-covid (they stopped sending them).  She still has a lot of secretions, but they're ok.  Ears are worse than eyes.  Hearing aids help, but not enough.    Zoloft is not working so well.  She's having trouble sleeping.  She does have more energy.  She gets headaches and lightheadedness once in a while  Left knee is still a problem.  Has a trigger finger and arthritis in hands.  She goes to the pool twice a week.  It's something she  looks forward to.  She has not used the hydrocodone b/c she had fallen after taking it once.  She has just taken a 1/2 since then.  She has not had severe pain otherwise to warrant it's use.    Her ring fingers are the trigger fingers she's got.  She does use a rubber ball to squeeze which helps somewhat.  Occasionally they do stick.    She says she's most bothered by her left knee and her lightheadedness.  The lightheadedness is every once in a while.  She feels like she's not in control.  The room is not spinning.  She admits she does not walk as well.  She cannot walk a line anymore like she could.  Her balance is off.  She does the balance classes twice a week.    Past Medical History:  Diagnosis Date  . Allergic rhinitis    pollen and grass  . Asthma   . Atrial fibrillation ([place]) 05/13/2018  . Blepharitis   . Carpal tunnel syndrome, bilateral 07/03/2016  . Family history of adverse reaction to anesthesia    " [place]   . Hyperlipidemia   . Irregular heart rhythm   . Macular degeneration 2012  . Osteoporosis   . Sensorineural hearing loss, bilateral 2007  .  Shingles 2004  . Skin cancer 2005   nose  . Torn meniscus 2006   right knee  . Tremor 2006   familiar.  dx by Dr. Quillian Quince  . Unspecified essential hypertension   . Unspecified hypothyroidism   . Urinary frequency     Past Surgical History:  Procedure Laterality Date  . ABDOMINAL HYSTERECTOMY  1977  . CATARACT EXTRACTION  1974   Left, IOL implants  . CATARACT EXTRACTION Right 1979   IOL implants  . CHOLECYSTECTOMY  1981  . COSMETIC SURGERY  1995   eye  . [place]  . DILATION AND CURETTAGE OF UTERUS  1977  . [place]  . HIATAL HERNIA REPAIR  1977  . SKIN CANCER EXCISION  2005   nose  . THYROIDECTOMY  1955  . TONSILLECTOMY  1931    Allergies  Allergen Reactions  . Clindamycin/Lincomycin Diarrhea    diarrhea  . Hemorrhoid  Preparation [Diperodon] Other (See Comments)    Listed on paper work  . Ibuprofen Other (See Comments)    Long time ago/ list on paper work  . Penicillins Other (See Comments)    Did it involve swelling of the face/tongue/throat, SOB, or low BP? Unknown Did it involve sudden or severe rash/hives, skin peeling, or any reaction on the inside of your mouth or nose? Unknown Did you need to seek medical attention at a hospital or doctor's office? Unknown When did it last happen? If all above answers are "NO", may proceed with cephalosporin use.    Outpatient Encounter Medications as of 12/25/2018  Medication Sig  . albuterol (PROVENTIL HFA;VENTOLIN HFA) 108 (90 Base) MCG/ACT inhaler Inhale 1 puff into the lungs every 6 (six) hours as needed for wheezing.   Marland Kitchen apixaban (ELIQUIS) 2.5 MG TABS tablet Take 1 tablet (2.5 mg total) by mouth 2 (two) times daily.  . Azelastine HCl 0.15 % SOLN USE 1 SPRAY NASALLY TWICE A DAY  . budesonide-formoterol (SYMBICORT) 160-4.5 MCG/ACT inhaler Inhale 2 puffs into the lungs 2 (two) times daily.  . calcium carbonate (TUMS - DOSED IN MG ELEMENTAL CALCIUM) 500 MG chewable tablet Chew 1 tablet by mouth daily as needed for indigestion.  . Cholecalciferol (VITAMIN D) 2000 units CAPS Take 1 capsule (2,000 Units total) by mouth daily.  . Diclofenac Sodium 1 % CREA Place 1 application onto the skin as needed.  . docusate sodium (COLACE) 100 MG capsule Take 100 mg by mouth daily.    . fluticasone (FLONASE) 50 MCG/ACT nasal spray Place 2 sprays into both nostrils daily.  Marland Kitchen HYDROcodone-acetaminophen (NORCO) 5-325 MG tablet Take 1 tablet by mouth 3 (three) times daily as needed for severe pain.  Marland Kitchen loratadine (CLARITIN) 10 MG tablet Take 1 tablet (10 mg total) by mouth daily.  . metoprolol succinate (TOPROL-XL) 25 MG 24 hr tablet Take 0.5 tablets (12.5 mg total) by mouth daily.  . minoxidil (ROGAINE) 2 % external solution Apply topically daily. To regrow hair  . Multiple  Vitamins-Minerals (PRESERVISION AREDS PO) Take 1 tablet by mouth 2 (two) times daily.  Marland Kitchen Propylene Glycol (SYSTANE COMPLETE OP) Apply to eye.  . Saline (SIMPLY SALINE) 0.9 % AERS Place 1 spray into the nose daily.   . sertraline (ZOLOFT) 25 MG tablet Take 1 tablet (25 mg total) by mouth daily.  . [DISCONTINUED] fluticasone furoate-vilanterol (BREO ELLIPTA) 100-25 MCG/INH AEPB USE 1 INHALATION DAILY  . [DISCONTINUED] Multiple Vitamins-Minerals (PRESERVISION AREDS 2) CAPS TAKE 1 CAPSULE BY MOUTH TWICE  A DAY (DO NOT CRUSH)  . [DISCONTINUED] Ophthalmic Irrigation Solution (OCUSOFT EYE [place] OP) Apply to eye.   No facility-administered encounter medications on file as of 12/25/2018.     Review of Systems:  Review of Systems  Constitutional: Negative for chills, fever and malaise/fatigue.  HENT: Positive for hearing loss. Negative for congestion and sore throat.   Eyes: Positive for blurred vision.  Respiratory: Negative for cough and shortness of breath.   Cardiovascular: Negative for chest pain, palpitations and leg swelling.  Gastrointestinal: Negative for abdominal pain, blood in stool, constipation, melena, nausea and vomiting.  Genitourinary: Negative for dysuria.  Musculoskeletal: Positive for joint pain. Negative for falls.  Skin: Negative for itching and rash.  Neurological: Positive for tingling (around left knee/thigh area), tremors and sensory change. Negative for dizziness and loss of consciousness.  Endo/Heme/Allergies: Bruises/bleeds easily.  Psychiatric/Behavioral: Positive for memory loss. Negative for depression. The patient is nervous/anxious. The patient does not have insomnia.     Health Maintenance  Topic Date Due  . INFLUENZA VACCINE  10/12/2018  . TETANUS/TDAP  04/07/2025  . DEXA SCAN  Completed  . PNA vac Low Risk Adult  Completed    Physical Exam: Vitals:   12/25/18 1129  BP: 128/82  Pulse: 61  Temp: 98.4 F (36.9 C)  TempSrc: Oral  SpO2: 98%  Weight:  133 lb (60.3 kg)  Height: 4\' 11"  (1.499 m)   Body mass index is 26.86 kg/m. Physical Exam Vitals signs reviewed.  Constitutional:      General: She is not in acute distress.    Appearance: Normal appearance. She is not toxic-appearing.  HENT:     Head: Normocephalic and atraumatic.  Eyes:     Comments: Glasses, watery eyes  Cardiovascular:     Rate and Rhythm: Rhythm irregular.     Heart sounds: No murmur.  Pulmonary:     Effort: Pulmonary effort is normal.     Breath sounds: Normal breath sounds. No wheezing, rhonchi or rales.  Abdominal:     General: Bowel sounds are normal.  Musculoskeletal: Normal range of motion.        General: Tenderness present.     Left lower leg: No edema.     Comments: Tender over left knee and distal thigh, reports tingling/soreness  Skin:    General: Skin is warm and dry.     Capillary Refill: Capillary refill takes less than 2 seconds.  Neurological:     General: No focal deficit present.     Mental Status: She is alert and oriented to person, place, and time.     Comments: Some short-term memory loss becoming notable (forgetting some info she's been told before); tremor present  Psychiatric:        Mood and Affect: Mood normal.     Comments: A bit anxious at appts which has worsened over years from my perspective     Labs reviewed: Basic Metabolic Panel: Recent Labs    01/24/18 1401  05/13/18 1042 05/13/18 1321 05/14/18 0219 12/17/18  NA 140   < > 140  --  142 141  K 4.3   < > 3.8  --  3.4* 4.6  CL 104  --  105  --  108 103  CO2 29  --  22  --  22 24  GLUCOSE 94  --  185*  --  97  --   BUN 15   < > 15  --  19 19  CREATININE 0.79   < >  1.11*  --  0.90 0.8  CALCIUM 9.6  --  9.9  --  9.0 9.5  TSH  --   --   --  4.418  --   --    < > = values in this interval not displayed.   Liver Function Tests: Recent Labs    01/24/18 1401 05/13/18 1042 12/17/18  AST 20 28 22   ALT 19 20 20   ALKPHOS  --  58 54  BILITOT 0.6 1.2  --   PROT  6.9 7.3 6.4  ALBUMIN  --  4.0 3.9   No results for input(s): LIPASE, AMYLASE in the last 8760 hours. No results for input(s): AMMONIA in the last 8760 hours. CBC: Recent Labs    01/24/18 1401 05/13/18 1042 05/14/18 0219 12/17/18  WBC 7.1 8.7 7.9 4.6  NEUTROABS 4,629 6.8  --   --   HGB 13.9 14.6 12.2 39.4*  HCT 41.8 45.1 36.4 39  MCV 94.6 97.8 94.1  --   PLT 262 246 211 207   Lipid Panel: Recent Labs    04/09/18 05/14/18 0219  CHOL 220* 211*  HDL 64 53  LDLCALC 137 146*  TRIG 96 61  CHOLHDL  --  4.0   Lab Results  Component Value Date   HGBA1C 5.9 10/02/2017    Assessment/Plan 1. Parkinsonism, unspecified Parkinsonism type ([place]) -referral to PT about this and left knee pain/numbness; worsened balance  2. Loss, sensation -persists around knee, very unusual with uncertain cause  3. Memory loss -short-term, more notable past year -took her off benzo already, but persists -is thinking about move to [place] within the next 12 mos -cont med mgt  4. Anxiety Cont low dose zoloft--could increase if truly worse, but she's not reporting this  5. Asthma, intrinsic -cont regular symbicort and prn albuterol  6. Paroxysmal atrial fibrillation (HCC) -cont eliquis therapy, rate controlled as of late with toprol xl  Labs/tests ordered:  PT ordered Next appt:  03/26/2019   Zariel Capano L. Mathias Bogacki, D.O. [facility] [address], [place] [address] Cell Phone (Mon-Fri 8am-5pm):  [phone] On Call:  [phone] & follow prompts after 5pm & weekends Office Phone:  [phone] Office Fax:  [phone]

## 2018-12-25 NOTE — Patient Instructions (Addendum)
I recommend you use tylenol for your pain in your left knee and your fingers.  You may take up to 3000mg  total per day.  Stop hydrocodone (you were not using anyway).  I will refer you to physical therapy Thamas Jaegers and Jonni Sanger) about your left knee pain and your balance.  I recommend you think about moving to assisted living in the next 6-12 months due to your memory slipping a little and the benefits of help with medications and managing your space.  Be sure to drink 6-8 8oz glasses of water per day to stay hydrated and help to prevent lightheadedness.  Change positions slowly and take pause when you get started.

## 2018-12-26 DIAGNOSIS — Z23 Encounter for immunization: Secondary | ICD-10-CM | POA: Diagnosis not present

## 2018-12-31 DIAGNOSIS — R2689 Other abnormalities of gait and mobility: Secondary | ICD-10-CM | POA: Diagnosis not present

## 2018-12-31 DIAGNOSIS — F418 Other specified anxiety disorders: Secondary | ICD-10-CM | POA: Diagnosis not present

## 2018-12-31 DIAGNOSIS — M25562 Pain in left knee: Secondary | ICD-10-CM | POA: Diagnosis not present

## 2018-12-31 DIAGNOSIS — R278 Other lack of coordination: Secondary | ICD-10-CM | POA: Diagnosis not present

## 2019-01-06 DIAGNOSIS — R278 Other lack of coordination: Secondary | ICD-10-CM | POA: Diagnosis not present

## 2019-01-06 DIAGNOSIS — F418 Other specified anxiety disorders: Secondary | ICD-10-CM | POA: Diagnosis not present

## 2019-01-06 DIAGNOSIS — M25562 Pain in left knee: Secondary | ICD-10-CM | POA: Diagnosis not present

## 2019-01-06 DIAGNOSIS — R2689 Other abnormalities of gait and mobility: Secondary | ICD-10-CM | POA: Diagnosis not present

## 2019-01-14 DIAGNOSIS — G250 Essential tremor: Secondary | ICD-10-CM | POA: Diagnosis not present

## 2019-01-14 DIAGNOSIS — F418 Other specified anxiety disorders: Secondary | ICD-10-CM | POA: Diagnosis not present

## 2019-01-14 DIAGNOSIS — M25562 Pain in left knee: Secondary | ICD-10-CM | POA: Diagnosis not present

## 2019-01-14 DIAGNOSIS — R278 Other lack of coordination: Secondary | ICD-10-CM | POA: Diagnosis not present

## 2019-01-14 DIAGNOSIS — G20 Parkinson's disease: Secondary | ICD-10-CM | POA: Diagnosis not present

## 2019-01-14 DIAGNOSIS — R2689 Other abnormalities of gait and mobility: Secondary | ICD-10-CM | POA: Diagnosis not present

## 2019-01-15 ENCOUNTER — Ambulatory Visit (INDEPENDENT_AMBULATORY_CARE_PROVIDER_SITE_OTHER): Payer: Medicare Other | Admitting: Family

## 2019-01-15 ENCOUNTER — Other Ambulatory Visit: Payer: Self-pay

## 2019-01-15 ENCOUNTER — Encounter: Payer: Self-pay | Admitting: Family

## 2019-01-15 DIAGNOSIS — Z0000 Encounter for general adult medical examination without abnormal findings: Secondary | ICD-10-CM | POA: Diagnosis not present

## 2019-01-15 NOTE — Progress Notes (Signed)
Subjective:   Christina Lawson is a 85 y.o. female who presents for Medicare Annual (Subsequent) preventive examination.  Review of Systems:  Cardiac Risk Factors include: advanced age (>4men, >46 women);family history of premature cardiovascular disease;hypertension     Objective:     Vitals: There were no vitals taken for this visit.  There is no height or weight on file to calculate BMI.  Advanced Directives 05/13/2018 04/17/2018 04/13/2018 11/20/2017 10/10/2017 09/05/2017 04/18/2017  Does Patient Have a Medical Advance Directive? Yes Yes No Yes Yes Yes Yes  Type of Advance Directive Out of facility DNR (pink MOST or yellow form);[place];Living will [place];Living will;Out of facility DNR (pink MOST or yellow form) - [place];Living will;Out of facility DNR (pink MOST or yellow form) [place];Living will;Out of facility DNR (pink MOST or yellow form) [place];Living will;Out of facility DNR (pink MOST or yellow form) [place];Living will;Out of facility DNR (pink MOST or yellow form)  Does patient want to make changes to medical advance directive? No - Patient declined No - Patient declined - No - Patient declined No - Patient declined No - Patient declined No - Patient declined  Copy of [place] in Chart? Yes - validated most recent copy scanned in chart (See row information) Yes - validated most recent copy scanned in chart (See row information) - Yes Yes Yes Yes  Pre-existing out of facility DNR order (yellow form or pink MOST form) Yellow form placed in chart (order not valid for inpatient use) Pink MOST form placed in chart (order not valid for inpatient use);Yellow form placed in chart (order not valid for inpatient use) - Yellow form placed in chart (order not valid for inpatient use) Yellow form placed in chart (order not valid for inpatient use) Yellow  form placed in chart (order not valid for inpatient use) Yellow form placed in chart (order not valid for inpatient use)    Tobacco Social History   Tobacco Use  Smoking Status Never Smoker  Smokeless Tobacco Never Used     Counseling given: Not Answered   Clinical Intake:  Pre-visit preparation completed: No  Pain : No/denies pain     BMI - recorded: 26.86 Nutritional Status: BMI 25 -29 Overweight Nutritional Risks: None Diabetes: No  How often do you need to have someone help you when you read instructions, pamphlets, or other written materials from your doctor or pharmacy?: 1 - Never What is the last grade level you completed in school?: Masters degree in social work  Astronomer Needed?: No  Information entered by :: Advice worker FNP-C  Past Medical History:  Diagnosis Date  . Allergic rhinitis    pollen and grass  . Asthma   . Atrial fibrillation ([place]) 05/13/2018  . Blepharitis   . Carpal tunnel syndrome, bilateral 07/03/2016  . Family history of adverse reaction to anesthesia    " [place] [facility] [facility]   . Hyperlipidemia   . Irregular heart rhythm   . Macular degeneration 2012  . Osteoporosis   . Sensorineural hearing loss, bilateral 2007  . Shingles 2004  . Skin cancer 2005   nose  . Torn meniscus 2006   right knee  . Tremor 2006   familiar.  dx by Dr. Quillian Quince  . Unspecified essential hypertension   . Unspecified hypothyroidism   . Urinary frequency    Past Surgical  History:  Procedure Laterality Date  . ABDOMINAL HYSTERECTOMY  1977  . CATARACT EXTRACTION  1974   Left, IOL implants  . CATARACT EXTRACTION Right 1979   IOL implants  . CHOLECYSTECTOMY  1981  . COSMETIC SURGERY  1995   eye  . [place]  . DILATION AND CURETTAGE OF UTERUS  1977  . [place]  . HIATAL HERNIA REPAIR  1977  . SKIN CANCER EXCISION  2005   nose  . THYROIDECTOMY  1955  . TONSILLECTOMY  1931    Family History  Problem Relation Age of Onset  . Emphysema Father   . Allergies Father   . Asthma Father   . Brain cancer Mother   . Cancer Mother        Brain  . Hypertension Mother   . Cancer Sister        Cervical  . Endometrial cancer Sister   . Breast cancer Sister 71  . Cancer Sister        Breast, metastasized to bone  . Breast cancer Sister        Cher Nakai of age  . Breast cancer Daughter 54  . Heart disease Maternal Grandfather   . Stroke Other   . Breast cancer Maternal Aunt   . Breast cancer Paternal Aunt   . Breast cancer Cousin   . Breast cancer Maternal Aunt   . Breast cancer Cousin    Social History   Socioeconomic History  . Marital status: Widowed    Spouse name: Not on file  . Number of children: 2  . Years of education: college  . Highest education level: Not on file  Occupational History  . Occupation: retired Education officer, museum    Comment: retired  Scientific laboratory technician  . Financial resource strain: Not hard at all  . Food insecurity    Worry: Never true    Inability: Never true  . Transportation needs    Medical: No    Non-medical: No  Tobacco Use  . Smoking status: Never Smoker  . Smokeless tobacco: Never Used  Substance and Sexual Activity  . Alcohol use: Yes    Comment: on occ.  . Drug use: No  . Sexual activity: Not Currently  Lifestyle  . Physical activity    Days per week: 3 days    Minutes per session: 40 min  . Stress: Only a little  Relationships  . Social connections    Talks on phone: More than three times a week    Gets together: More than three times a week    Attends religious service: Never    Active member of club or organization: No    Attends meetings of clubs or organizations: Never    Relationship status: Widowed  Other Topics Concern  . Not on file  Social History Narrative   Lives at [facility]. Married 1958.   Never smoked   Alcohol minimal   Exercise predominantly walking, water aerobic   Retired  Education officer, museum       Outpatient Encounter Medications as of 01/15/2019  Medication Sig  . albuterol (PROVENTIL HFA;VENTOLIN HFA) 108 (90 Base) MCG/ACT inhaler Inhale 1 puff into the lungs every 6 (six) hours as needed for wheezing.   Marland Kitchen apixaban (ELIQUIS) 2.5 MG TABS tablet Take 1 tablet (2.5 mg total) by mouth 2 (two) times daily.  . Azelastine HCl 0.15 % SOLN USE 1 SPRAY NASALLY TWICE A DAY  . budesonide-formoterol (  SYMBICORT) 160-4.5 MCG/ACT inhaler Inhale 2 puffs into the lungs 2 (two) times daily.  . calcium carbonate (TUMS - DOSED IN MG ELEMENTAL CALCIUM) 500 MG chewable tablet Chew 1 tablet by mouth daily as needed for indigestion.  . Cholecalciferol (VITAMIN D) 2000 units CAPS Take 1 capsule (2,000 Units total) by mouth daily.  . Diclofenac Sodium 1 % CREA Place 1 application onto the skin as needed.  . docusate sodium (COLACE) 100 MG capsule Take 100 mg by mouth daily.    . fluticasone (FLONASE) 50 MCG/ACT nasal spray Place 2 sprays into both nostrils daily.  Marland Kitchen loratadine (CLARITIN) 10 MG tablet Take 1 tablet (10 mg total) by mouth daily.  . metoprolol succinate (TOPROL-XL) 25 MG 24 hr tablet Take 0.5 tablets (12.5 mg total) by mouth daily.  . minoxidil (ROGAINE) 2 % external solution Apply topically daily. To regrow hair  . Multiple Vitamins-Minerals (PRESERVISION AREDS PO) Take 1 tablet by mouth 2 (two) times daily.  Marland Kitchen Propylene Glycol (SYSTANE COMPLETE OP) Apply to eye.  . Saline (SIMPLY SALINE) 0.9 % AERS Place 1 spray into the nose daily.   . sertraline (ZOLOFT) 25 MG tablet Take 1 tablet (25 mg total) by mouth daily.   No facility-administered encounter medications on file as of 01/15/2019.     Activities of Daily Living In your present state of health, do you have any difficulty performing the following activities: 01/15/2019 05/13/2018  Hearing? Tempie Donning  Comment wears hearing aids -  Vision? N N  Difficulty concentrating or making decisions? Y N  Comment Remembering -  Walking  or climbing stairs? N Y  Dressing or bathing? N N  Doing errands, shopping? Tempie Donning  Comment [place] and facility assist -  Conservation officer, nature and eating ? Y -  Comment facility assist -  Using the Toilet? N -  In the past six months, have you accidently leaked urine? N -  Do you have problems with loss of bowel control? N -  Managing your Medications? Y -  Comment assisted by facility -  Managing your Finances? N -  Housekeeping or managing your Housekeeping? Y -  Comment facility assist -  Some recent data might be hidden    Patient Care Team: Gayland Curry, DO as PCP - General (Geriatric Medicine) Elouise Munroe, MD as PCP - Cardiology (Cardiology) [facility], [facility], Armando Reichert, MD as Consulting Physician (Pulmonary Disease) Verl Blalock, Marijo Conception, MD (Inactive) as Consulting Physician (Cardiology) Latanya Maudlin, MD as Consulting Physician (Orthopedic Surgery) Kathrynn Ducking, MD as Consulting Physician (Neurology) [place], Jennefer Bravo, MD as Referring Physician (Dermatology) Gean Quint, MD as Consulting Physician (Allergy and Immunology) Shon Hough, MD as Consulting Physician (Ophthalmology) Paula Compton, MD as Consulting Physician (Obstetrics and Gynecology) Vicie Mutters, MD as Consulting Physician (Otolaryngology) Raeanne Gathers, AUD (Audiology) Carmel Sacramento, CCC-A (Audiology)    Assessment:   This is a routine wellness examination for [place].  Exercise Activities and Dietary recommendations Current Exercise Habits: Structured exercise class, Type of exercise: Other - see comments(water aerobics), Time (Minutes): 45, Frequency (Times/Week): 2, Weekly Exercise (Minutes/Week): 90, Intensity: Mild, Exercise limited by: None identified  Goals    . Increase water intake     Increase water intake by 1-2 glasses per day       Fall Risk Fall Risk  01/15/2019 08/21/2018 04/17/2018 11/20/2017 10/10/2017  Falls in the past year? 1 1 0 No No   Number falls in past yr: 1 0  0 - -  Injury with Fall? 0 0 0 - -  Comment - - - - -  Risk for fall due to : - - Impaired balance/gait;Impaired vision;Medication side effect - -  Follow up - - Education provided - -   Is the patient's home free of loose throw rugs in walkways, pet beds, electrical cords, etc?   no      Grab bars in the bathroom? yes      Handrails on the stairs?   no      Adequate lighting?   yes  Depression Screen PHQ 2/9 Scores 01/15/2019 08/21/2018 04/17/2018 11/20/2017  PHQ - 2 Score 0 0 0 0     Cognitive Function MMSE - Mini Mental State Exam 11/20/2017 09/19/2016 09/29/2015  Orientation to time 5 4 5   Orientation to Place 5 5 5   Registration 3 3 3   Attention/ Calculation 5 5 5   Recall 1 0 3  Language- name 2 objects 2 2 2   Language- repeat 1 1 1   Language- follow 3 step command 3 3 3   Language- read & follow direction 1 1 1   Write a sentence 1 1 1   Copy design 1 1 1   Total score 28 26 30      6CIT Screen 01/15/2019  What Year? 0 points  What month? 0 points  What time? 0 points  Count back from 20 0 points  Months in reverse 2 points  Repeat phrase 0 points  Total Score 2    Immunization History  Administered Date(s) Administered  . Influenza Whole 12/12/2011, 12/31/2012  . Influenza, High Dose Seasonal PF 12/20/2018  . Influenza,inj,Quad PF,6+ Mos 01/04/2018  . Influenza-Unspecified 12/31/2014, 01/06/2016, 01/01/2017  . Pneumococcal Conjugate-13 07/14/2014  . Pneumococcal Polysaccharide-23 03/13/2005  . Tdap 04/08/2015  . Zoster 03/13/2006  . Zoster Recombinat (Shingrix) 03/29/2017, 05/24/2017    Qualifies for Shingles Vaccine? Up to date   Screening Tests Health Maintenance  Topic Date Due  . TETANUS/TDAP  04/07/2025  . INFLUENZA VACCINE  Completed  . DEXA SCAN  Completed  . PNA vac Low Risk Adult  Completed    Cancer Screenings: Lung: Low Dose CT Chest recommended if Age 43-80 years, 30 pack-year currently smoking OR have quit w/in  15years. Patient does not qualify. Breast:  Up to date on Mammogram? Yes   Up to date of Bone Density/Dexa? Yes Colorectal: N/A   Additional Screenings: Hepatitis C Screening: Low Risk  Plan:   I have personally reviewed and noted the following in the patient's chart:   . Medical and social history . Use of alcohol, tobacco or illicit drugs  . Current medications and supplements . Functional ability and status . Nutritional status . Physical activity . Advanced directives . List of other physicians . Hospitalizations, surgeries, and ER visits in previous 12 months . Vitals . Screenings to include cognitive, depression, and falls . Referrals and appointments  In addition, I have reviewed and discussed with patient certain preventive protocols, quality metrics, and best practice recommendations. A written personalized care plan for preventive services as well as general preventive health recommendations were provided to patient.  Sandrea Hughs, NP  01/15/2019

## 2019-01-15 NOTE — Patient Instructions (Signed)
[name] , Thank you for taking time to come for your Medicare Wellness Visit. I appreciate your ongoing commitment to your health goals. Please review the following plan we discussed and let me know if I can assist you in the future.   Screening recommendations/referrals: Colonoscopy: N/A  Mammogram: Up to date  Bone Density: Up to date  Recommended yearly ophthalmology/optometry visit for glaucoma screening and checkup Recommended yearly dental visit for hygiene and checkup  Vaccinations: Influenza vaccine : Up to date  Pneumococcal vaccine: Up to date  Tdap vaccine:Up to date due 04/07/2025  Shingles vaccine :  Up to date    Advanced directives:Yes   Conditions/risks identified: Advance Age female > 83 yrs,Hypertension,Family History of premature cardiovascular disease   Next appointment: 1 year    Preventive Care 83 Years and Older, Female Preventive care refers to lifestyle choices and visits with your health care provider that can promote health and wellness. What does preventive care include?  A yearly physical exam. This is also called an annual well check.  Dental exams once or twice a year.  Routine eye exams. Ask your health care provider how often you should have your eyes checked.  Personal lifestyle choices, including:  Daily care of your teeth and gums.  Regular physical activity.  Eating a healthy diet.  Avoiding tobacco and drug use.  Limiting alcohol use.  Practicing safe sex.  Taking low-dose aspirin every day.  Taking vitamin and mineral supplements as recommended by your health care provider. What happens during an annual well check? The services and screenings done by your health care provider during your annual well check will depend on your age, overall health, lifestyle risk factors, and family history of disease. Counseling  Your health care provider may ask you questions about your:  Alcohol use.  Tobacco use.  Drug use.   Emotional well-being.  Home and relationship well-being.  Sexual activity.  Eating habits.  History of falls.  Memory and ability to understand (cognition).  Work and work Statistician.  Reproductive health. Screening  You may have the following tests or measurements:  Height, weight, and BMI.  Blood pressure.  Lipid and cholesterol levels. These may be checked every 5 years, or more frequently if you are over 83 years old.  Skin check.  Lung cancer screening. You may have this screening every year starting at age 83 if you have a 30-pack-year history of smoking and currently smoke or have quit within the past 15 years.  Fecal occult blood test (FOBT) of the stool. You may have this test every year starting at age 83.  Flexible sigmoidoscopy or colonoscopy. You may have a sigmoidoscopy every 5 years or a colonoscopy every 10 years starting at age 83.  Hepatitis C blood test.  Hepatitis B blood test.  Sexually transmitted disease (STD) testing.  Diabetes screening. This is done by checking your blood sugar (glucose) after you have not eaten for a while (fasting). You may have this done every 1-3 years.  Bone density scan. This is done to screen for osteoporosis. You may have this done starting at age 83.  Mammogram. This may be done every 1-2 years. Talk to your health care provider about how often you should have regular mammograms. Talk with your health care provider about your test results, treatment options, and if necessary, the need for more tests. Vaccines  Your health care provider may recommend certain vaccines, such as:  Influenza vaccine. This is recommended every year.  Tetanus, diphtheria, and acellular pertussis (Tdap, Td) vaccine. You may need a Td booster every 10 years.  Zoster vaccine. You may need this after age 83.  Pneumococcal 13-valent conjugate (PCV13) vaccine. One dose is recommended after age 83.  Pneumococcal polysaccharide (PPSV23)  vaccine. One dose is recommended after age 83. Talk to your health care provider about which screenings and vaccines you need and how often you need them. This information is not intended to replace advice given to you by your health care provider. Make sure you discuss any questions you have with your health care provider. Document Released: 03/26/2015 Document Revised: 11/17/2015 Document Reviewed: 12/29/2014 Elsevier Interactive Patient Education  2017 [place] Prevention in the Home Falls can cause injuries. They can happen to people of all ages. There are many things you can do to make your home safe and to help prevent falls. What can I do on the outside of my home?  Regularly fix the edges of walkways and driveways and fix any cracks.  Remove anything that might make you trip as you walk through a door, such as a raised step or threshold.  Trim any bushes or trees on the path to your home.  Use bright outdoor lighting.  Clear any walking paths of anything that might make someone trip, such as rocks or tools.  Regularly check to see if handrails are loose or broken. Make sure that both sides of any steps have handrails.  Any raised decks and porches should have guardrails on the edges.  Have any leaves, snow, or ice cleared regularly.  Use sand or salt on walking paths during winter.  Clean up any spills in your garage right away. This includes oil or grease spills. What can I do in the bathroom?  Use night lights.  Install grab bars by the toilet and in the tub and shower. Do not use towel bars as grab bars.  Use non-skid mats or decals in the tub or shower.  If you need to sit down in the shower, use a plastic, non-slip stool.  Keep the floor dry. Clean up any water that spills on the floor as soon as it happens.  Remove soap buildup in the tub or shower regularly.  Attach bath mats securely with double-sided non-slip rug tape.  Do not have throw rugs  and other things on the floor that can make you trip. What can I do in the bedroom?  Use night lights.  Make sure that you have a light by your bed that is easy to reach.  Do not use any sheets or blankets that are too big for your bed. They should not hang down onto the floor.  Have a firm chair that has side arms. You can use this for support while you get dressed.  Do not have throw rugs and other things on the floor that can make you trip. What can I do in the kitchen?  Clean up any spills right away.  Avoid walking on wet floors.  Keep items that you use a lot in easy-to-reach places.  If you need to reach something above you, use a strong step stool that has a grab bar.  Keep electrical cords out of the way.  Do not use floor polish or wax that makes floors slippery. If you must use wax, use non-skid floor wax.  Do not have throw rugs and other things on the floor that can make you trip. What can I do  with my stairs?  Do not leave any items on the stairs.  Make sure that there are handrails on both sides of the stairs and use them. Fix handrails that are broken or loose. Make sure that handrails are as long as the stairways.  Check any carpeting to make sure that it is firmly attached to the stairs. Fix any carpet that is loose or worn.  Avoid having throw rugs at the top or bottom of the stairs. If you do have throw rugs, attach them to the floor with carpet tape.  Make sure that you have a light switch at the top of the stairs and the bottom of the stairs. If you do not have them, ask someone to add them for you. What else can I do to help prevent falls?  Wear shoes that:  Do not have high heels.  Have rubber bottoms.  Are comfortable and fit you well.  Are closed at the toe. Do not wear sandals.  If you use a stepladder:  Make sure that it is fully opened. Do not climb a closed stepladder.  Make sure that both sides of the stepladder are locked into  place.  Ask someone to hold it for you, if possible.  Clearly mark and make sure that you can see:  Any grab bars or handrails.  First and last steps.  Where the edge of each step is.  Use tools that help you move around (mobility aids) if they are needed. These include:  Canes.  Walkers.  Scooters.  Crutches.  Turn on the lights when you go into a dark area. Replace any light bulbs as soon as they burn out.  Set up your furniture so you have a clear path. Avoid moving your furniture around.  If any of your floors are uneven, fix them.  If there are any pets around you, be aware of where they are.  Review your medicines with your doctor. Some medicines can make you feel dizzy. This can increase your chance of falling. Ask your doctor what other things that you can do to help prevent falls. This information is not intended to replace advice given to you by your health care provider. Make sure you discuss any questions you have with your health care provider. Document Released: 12/24/2008 Document Revised: 08/05/2015 Document Reviewed: 04/03/2014 Elsevier Interactive Patient Education  2017 [employer].

## 2019-01-15 NOTE — Progress Notes (Signed)
This service is provided via telemedicine  No vital signs collected/recorded due to the encounter was a telemedicine visit.   Location of patient (ex: home, work):  Home   Patient consents to a telephone visit:  Yes  Location of the provider (ex: office, home):  Office   Name of any referring provider:  Hollace Kinnier, [place] of all persons participating in the telemedicine service and their role in the encounter:  Dinah Ngetich NP, Ruthell Rummage CMA, and patient.   Time spent on call:  Ruthell Rummage CMA spent 12 minutes on phone with patient.

## 2019-01-16 ENCOUNTER — Telehealth: Payer: Self-pay

## 2019-01-16 NOTE — Telephone Encounter (Signed)
Call from [facility] a nurse at [facility]. She is reporting that Symbicort is too expensive, she is paying $300 + dollars for her medication.   She is asking for nebulized medications which may be cheaper for her, or a less expensive inhaler.   Please advise:  [phone] phone [phone] fax [phone] fax  Please fax prescription to any of the above fax numbers.

## 2019-01-17 NOTE — Telephone Encounter (Addendum)
I spoke with Christina Lawson after speaking with Jeanett Schlein. Christina Lawson told me that because the patient is in an independent living apartment an inhaler would be better for her because of the ease of use. Please advise and thank you.

## 2019-01-17 NOTE — Addendum Note (Signed)
Addended by: Lucrezia Starch I on: 01/17/2019 03:58 PM   Modules accepted: Orders

## 2019-01-17 NOTE — Telephone Encounter (Signed)
I thought she was on Breo as of the last visit? We can change to Pulmicort 0.5mg  and Perforomist BID. I will route to [facility] since she is do good at getting these medications ordered.   Salvatore Marvel, MD Allergy and [place] [facility] [place]

## 2019-01-20 NOTE — Telephone Encounter (Signed)
Attempted to contact patient to make her aware of the medication change. No answer. Left message.

## 2019-01-20 NOTE — Telephone Encounter (Signed)
Patient wanted to know the cost of medication and advise we don't know what the cost of the medication for Breo was. Patient stated she was going to call her insurance company to see if Memory Dance is cheaper then Symbicort of if [place]. Patient will contact us back regarding if to continue with Breo or to switch to alternative.

## 2019-01-20 NOTE — Telephone Encounter (Signed)
Ok let's go back to [employer] 100/25 one puff daily.  Salvatore Marvel, MD Allergy and [place] [facility] [place]

## 2019-01-20 NOTE — Telephone Encounter (Signed)
The Symbicort is the one that she has issues paying for. States it is too expensive.

## 2019-01-20 NOTE — Telephone Encounter (Signed)
We can change to Symbicort 160/4.5 two puffs BID.   Salvatore Marvel, MD Allergy and [place] [facility] [place]

## 2019-01-21 DIAGNOSIS — F418 Other specified anxiety disorders: Secondary | ICD-10-CM | POA: Diagnosis not present

## 2019-01-21 DIAGNOSIS — M25562 Pain in left knee: Secondary | ICD-10-CM | POA: Diagnosis not present

## 2019-01-21 DIAGNOSIS — R2689 Other abnormalities of gait and mobility: Secondary | ICD-10-CM | POA: Diagnosis not present

## 2019-01-21 DIAGNOSIS — G250 Essential tremor: Secondary | ICD-10-CM | POA: Diagnosis not present

## 2019-01-21 DIAGNOSIS — G20 Parkinson's disease: Secondary | ICD-10-CM | POA: Diagnosis not present

## 2019-01-21 DIAGNOSIS — R278 Other lack of coordination: Secondary | ICD-10-CM | POA: Diagnosis not present

## 2019-01-21 MED ORDER — BUDESONIDE-FORMOTEROL FUMARATE 160-4.5 MCG/ACT IN AERO: 2.0000 | INHALATION_SPRAY | Freq: Two times a day (BID) | RESPIRATORY_TRACT | 3 refills | Status: DC

## 2019-01-21 NOTE — Addendum Note (Signed)
Addended by: Neomia Dear on: 01/21/2019 02:06 PM   Modules accepted: Orders

## 2019-01-21 NOTE — Telephone Encounter (Signed)
Sounds good - we can give her some other names to check on medication costs, too (D[place]era, Advair, [place], AirDuo).   Salvatore Marvel, MD [facility] [place] [facility] [place]

## 2019-01-21 NOTE — Telephone Encounter (Signed)
Call back from the patient.  She states that she would prefer the Symbicort.  Will only have to pay $100 with [facility]. Call to [facility], per Jeanett Schlein, we have permission to talk to her.  We just need to send a prescription there.  Rx has been sent.

## 2019-01-21 NOTE — Telephone Encounter (Signed)
LMOM for patient to give a call back

## 2019-03-26 ENCOUNTER — Encounter: Payer: Self-pay | Admitting: Internal Medicine

## 2019-03-26 ENCOUNTER — Other Ambulatory Visit: Payer: Self-pay

## 2019-03-26 ENCOUNTER — Non-Acute Institutional Stay: Payer: Medicare Other | Admitting: Internal Medicine

## 2019-03-26 VITALS — BP 138/82 | HR 65 | Temp 97.3°F | Ht 59.0 in | Wt 138.4 lb

## 2019-03-26 DIAGNOSIS — F419 Anxiety disorder, unspecified: Secondary | ICD-10-CM | POA: Diagnosis not present

## 2019-03-26 DIAGNOSIS — I480 Paroxysmal atrial fibrillation: Secondary | ICD-10-CM

## 2019-03-26 DIAGNOSIS — F5104 Psychophysiologic insomnia: Secondary | ICD-10-CM

## 2019-03-26 DIAGNOSIS — G20 Parkinson's disease: Secondary | ICD-10-CM | POA: Diagnosis not present

## 2019-03-26 DIAGNOSIS — I10 Essential (primary) hypertension: Secondary | ICD-10-CM | POA: Diagnosis not present

## 2019-03-26 DIAGNOSIS — J45909 Unspecified asthma, uncomplicated: Secondary | ICD-10-CM | POA: Diagnosis not present

## 2019-03-26 DIAGNOSIS — R200 Anesthesia of skin: Secondary | ICD-10-CM | POA: Diagnosis not present

## 2019-03-26 DIAGNOSIS — G20C Parkinsonism, unspecified: Secondary | ICD-10-CM

## 2019-03-26 MED ORDER — SERTRALINE HCL 50 MG PO TABS: 50.0000 mg | ORAL_TABLET | Freq: Every day | ORAL | 3 refills | Status: DC

## 2019-03-26 NOTE — Patient Instructions (Signed)
Please increase your zoloft to 50mg  daily in the morning for your anxiety and tremor.

## 2019-03-26 NOTE — Progress Notes (Signed)
Location:  Occupational psychologist of Service:  Clinic (12)  Provider: Dione Petron L. Mariea Clonts, D.O., C.M.D.  Code Status: DNR Goals of Care:  Advanced Directives 03/26/2019  Does Patient Have a Medical Advance Directive? Yes  Type of Advance Directive Out of facility DNR (pink MOST or yellow form)  Does patient want to make changes to medical advance directive? No - Patient declined  Copy of [place] in Chart? -  Pre-existing out of facility DNR order (yellow form or pink MOST form) -     Chief Complaint  Patient presents with  . Medical Management of Chronic Issues    66month follow up    HPI: Patient is a 86 y.o. female seen today for medical management of chronic diseases.    She had her first covid vaccine moderna yesterday.  They gave her a card.  She's ok, but doesn't see well, doesn't hear well, has all kinds of aches and pains.  She is functioning.  She's unhappy with the political situation which makes her aggravated.  She tries to not watch the news, but does watch some.  She's written to senators to no avail.  She has lost a cousin to covid in [place], [place].    She wishes she could take less medication.  Her tremor is worse. She had been using alprazolam and now is on zoloft just 25mg . She is having some difficulty sleeping now--takes about an hour to get to sleep.  She does watch the news in the evening.    She has been reading a lot which she enjoys.  She sometimes reads before bed.  She does not notice any improvement with sleep if she reads before bed.    Left knee still gives her trouble.  She's not going to the pool, but does the am exercises with [place].  It's more numbness than pain and seems to get more numb when doing the exercises.  She has not used the voltaren I previously gave her lately.  She is still worried about her parkinsonism.  Does occasionally drool and has her tremor.  She does not want to have parkinson's disease.   Encouraged continued exercises.  Does her balance exercises from [place].  For her breathing, she's switched from [facility] to Symbicort.  Cost had gone up dramatically in October.    Past Medical History:  Diagnosis Date  . Allergic rhinitis    pollen and grass  . Asthma   . Atrial fibrillation ([place]) 05/13/2018  . Blepharitis   . Carpal tunnel syndrome, bilateral 07/03/2016  . Family history of adverse reaction to anesthesia    " [place] ([place] ) [place]   . Hyperlipidemia   . Irregular heart rhythm   . Macular degeneration 2012  . Osteoporosis   . Sensorineural hearing loss, bilateral 2007  . Shingles 2004  . Skin cancer 2005   nose  . Torn meniscus 2006   right knee  . Tremor 2006   familiar.  dx by Dr. Quillian Quince  . Unspecified essential hypertension   . Unspecified hypothyroidism   . Urinary frequency     Past Surgical History:  Procedure Laterality Date  . ABDOMINAL HYSTERECTOMY  1977  . CATARACT EXTRACTION  1974   Left, IOL implants  . CATARACT EXTRACTION Right 1979   IOL implants  . CHOLECYSTECTOMY  1981  . COSMETIC SURGERY  1995   eye  . [place]  .  DILATION AND CURETTAGE OF UTERUS  1977  . [place]  . HIATAL HERNIA REPAIR  1977  . SKIN CANCER EXCISION  2005   nose  . THYROIDECTOMY  1955  . TONSILLECTOMY  1931    Allergies  Allergen Reactions  . Clindamycin/Lincomycin Diarrhea    diarrhea  . Hemorrhoid Preparation [Diperodon] Other (See Comments)    Listed on paper work  . Ibuprofen Other (See Comments)    Long time ago/ list on paper work  . Penicillins Other (See Comments)    Did it involve swelling of the face/tongue/throat, SOB, or low BP? Unknown Did it involve sudden or severe rash/hives, skin peeling, or any reaction on the inside of your mouth or nose? Unknown Did you need to seek medical attention at a hospital or doctor's office? Unknown When did it last happen? If all above  answers are "NO", may proceed with cephalosporin use.    Outpatient Encounter Medications as of 03/26/2019  Medication Sig  . albuterol (PROVENTIL HFA;VENTOLIN HFA) 108 (90 Base) MCG/ACT inhaler Inhale 1 puff into the lungs every 6 (six) hours as needed for wheezing.   Marland Kitchen apixaban (ELIQUIS) 2.5 MG TABS tablet Take 1 tablet (2.5 mg total) by mouth 2 (two) times daily.  . Azelastine HCl 0.15 % SOLN USE 1 SPRAY NASALLY TWICE A DAY  . budesonide-formoterol (SYMBICORT) 160-4.5 MCG/ACT inhaler Inhale 2 puffs into the lungs 2 (two) times daily.  . calcium carbonate (TUMS - DOSED IN MG ELEMENTAL CALCIUM) 500 MG chewable tablet Chew 1 tablet by mouth daily as needed for indigestion.  . Cholecalciferol (VITAMIN D) 2000 units CAPS Take 1 capsule (2,000 Units total) by mouth daily.  . Diclofenac Sodium 1 % CREA Place 1 application onto the skin as needed.  . docusate sodium (COLACE) 100 MG capsule Take 100 mg by mouth daily.    . fluticasone (FLONASE) 50 MCG/ACT nasal spray Place 2 sprays into both nostrils daily.  Marland Kitchen loratadine (CLARITIN) 10 MG tablet Take 1 tablet (10 mg total) by mouth daily.  . metoprolol succinate (TOPROL-XL) 25 MG 24 hr tablet Take 0.5 tablets (12.5 mg total) by mouth daily.  . minoxidil (ROGAINE) 2 % external solution Apply topically daily. To regrow hair  . Multiple Vitamins-Minerals (PRESERVISION AREDS PO) Take 1 tablet by mouth 2 (two) times daily.  Marland Kitchen Propylene Glycol (SYSTANE COMPLETE OP) Apply to eye.  . Saline (SIMPLY SALINE) 0.9 % AERS Place 1 spray into the nose daily.   . sertraline (ZOLOFT) 25 MG tablet Take 1 tablet (25 mg total) by mouth daily.   No facility-administered encounter medications on file as of 03/26/2019.    Review of Systems:  Review of Systems  Constitutional: Negative for chills, fever and malaise/fatigue.  HENT: Positive for hearing loss. Negative for congestion and sore throat.        Hearing aids  Eyes: Positive for blurred vision.  Respiratory:  Negative for cough and shortness of breath.   Cardiovascular: Negative for chest pain, palpitations and leg swelling.       Reports some swelling in left leg at times and uses compression sock  Gastrointestinal: Negative for abdominal pain, blood in stool, constipation, diarrhea and melena.  Genitourinary: Negative for dysuria.  Musculoskeletal: Positive for joint pain. Negative for falls.  Skin: Negative for itching and rash.  Neurological: Positive for tingling and sensory change. Negative for loss of consciousness.  Endo/Heme/Allergies: Bruises/bleeds easily.  Psychiatric/Behavioral: Positive for depression and memory loss. The patient is  nervous/anxious.        Some difficulty getting to sleep now for about an hour     Health Maintenance  Topic Date Due  . TETANUS/TDAP  04/07/2025  . INFLUENZA VACCINE  Completed  . DEXA SCAN  Completed  . PNA vac Low Risk Adult  Completed    Physical Exam: Vitals:   03/26/19 0900  BP: 138/82  Pulse: 65  Temp: (!) 97.3 F (36.3 C)  TempSrc: Temporal  SpO2: 97%  Weight: 138 lb 6.4 oz (62.8 kg)  Height: 4\' 11"  (1.499 m)   Body mass index is 27.95 kg/m. Physical Exam Vitals reviewed.  Constitutional:      General: She is not in acute distress.    Appearance: Normal appearance. She is not toxic-appearing.  HENT:     Head: Normocephalic and atraumatic.     Ears:     Comments: Hearing aid Eyes:     Comments: glasses  Cardiovascular:     Rate and Rhythm: Rhythm irregular.  Pulmonary:     Effort: Pulmonary effort is normal.     Breath sounds: Normal breath sounds. No wheezing, rhonchi or rales.  Abdominal:     General: Bowel sounds are normal.  Musculoskeletal:        General: Normal range of motion.     Right lower leg: No edema.     Left lower leg: No edema.  Skin:    General: Skin is warm and dry.  Neurological:     General: No focal deficit present.     Mental Status: She is alert and oriented to person, place, and time.    Psychiatric:     Comments: Tremor, anxiety     Labs reviewed: Basic Metabolic Panel: Recent Labs    05/13/18 1042 05/13/18 1321 05/14/18 0219 12/17/18 0000  NA 140  --  142 141  K 3.8  --  3.4* 4.6  CL 105  --  108 103  CO2 22  --  22 24  GLUCOSE 185*  --  97  --   BUN 15  --  19 19  CREATININE 1.11*  --  0.90 0.8  CALCIUM 9.9  --  9.0 9.5  TSH  --  4.418  --   --    Liver Function Tests: Recent Labs    05/13/18 1042 12/17/18 0000  AST 28 22  ALT 20 20  ALKPHOS 58 54  BILITOT 1.2  --   PROT 7.3 6.4  ALBUMIN 4.0 3.9   No results for input(s): LIPASE, AMYLASE in the last 8760 hours. No results for input(s): AMMONIA in the last 8760 hours. CBC: Recent Labs    05/13/18 1042 05/14/18 0219 12/17/18 0000  WBC 8.7 7.9 4.6  NEUTROABS 6.8  --   --   HGB 14.6 12.2 39.4*  HCT 45.1 36.4 39  MCV 97.8 94.1  --   PLT 246 211 207   Lipid Panel: Recent Labs    04/09/18 0000 05/14/18 0219  CHOL 220* 211*  HDL 64 53  LDLCALC 137 146*  TRIG 96 61  CHOLHDL  --  4.0   Lab Results  Component Value Date   HGBA1C 5.9 10/02/2017    Assessment/Plan 1. Anxiety -remains a problem, seems the anxiety from the politics in this country is keeping her awake at night also and her tremor is a bit worse -will increase zoloft to 50mg  daily from 25mg  daily - sertraline (ZOLOFT) 50 MG tablet; Take 1 tablet (50  mg total) by mouth daily.  Dispense: 30 tablet; Refill: 3  2. Parkinsonism, unspecified Parkinsonism type ([place]) -has some features of Parkinson's disease, but does seem to have improvement in tremor with treatment of anxiety suggesting that this is a big component  3. Loss, sensation -ongoing over left knee, not using the voltaren  4. Asthma, intrinsic -has switched to symbicort from breo due to cost -doing fine here w/o exacerbations  5. Paroxysmal atrial fibrillation (HCC) -rate is controlled and stable on eliquis, recheck cbc and cmp before next visit  6.  Essential hypertension -bp at goal with current therapy, cont same meds and f/u cmp before next visit  7. Psychophysiological insomnia -recommended she read rather than watch news before bed to allow her mind to calm down -clarifying that zoloft is in am  Labs/tests ordered:  Cbc, cmp before at [facility] Next appt:  07/30/2019 with fasting labs before  Elmar Antigua L. Koji Niehoff, D.O. [facility] [address], [place] [address] Cell Phone (Mon-Fri 8am-5pm):  [phone] On Call:  [phone] & follow prompts after 5pm & weekends Office Phone:  [phone] Office Fax:  [phone]

## 2019-03-27 DIAGNOSIS — Z23 Encounter for immunization: Secondary | ICD-10-CM | POA: Diagnosis not present

## 2019-04-10 ENCOUNTER — Telehealth: Payer: Medicare Other | Admitting: Internal Medicine

## 2019-04-11 ENCOUNTER — Encounter: Payer: Self-pay | Admitting: Internal Medicine

## 2019-04-11 ENCOUNTER — Telehealth (INDEPENDENT_AMBULATORY_CARE_PROVIDER_SITE_OTHER): Payer: Medicare Other | Admitting: Internal Medicine

## 2019-04-11 VITALS — BP 142/69 | HR 65 | Temp 92.0°F | Ht 60.0 in | Wt 133.0 lb

## 2019-04-11 DIAGNOSIS — I480 Paroxysmal atrial fibrillation: Secondary | ICD-10-CM

## 2019-04-11 DIAGNOSIS — I10 Essential (primary) hypertension: Secondary | ICD-10-CM | POA: Diagnosis not present

## 2019-04-11 DIAGNOSIS — R079 Chest pain, unspecified: Secondary | ICD-10-CM

## 2019-04-11 DIAGNOSIS — R55 Syncope and collapse: Secondary | ICD-10-CM | POA: Diagnosis not present

## 2019-04-11 NOTE — Patient Instructions (Signed)
Medication Instructions:  No changes  *If you need a refill on your cardiac medications before your next appointment, please call your pharmacy*  Lab Work: Not needed   Testing/Procedures:  not   needed  Follow-Up: At [facility], you and your health needs are our priority.  As part of our continuing mission to provide you with exceptional heart care, we have created designated Provider Care Teams.  These Care Teams include your primary Cardiologist (physician) and Advanced Practice Providers (APPs -  Physician Assistants and Nurse Practitioners) who all work together to provide you with the care you need, when you need it.  Your next appointment:   6 month(s)- July 2021  The format for your next appointment:   In Person  Provider:   Cherlynn Kaiser, MD  Other Instructions n/a

## 2019-04-11 NOTE — Progress Notes (Signed)
Cardiology Office Note:    Date:  04/11/2019   ID:  [name], DOB 10-04-1923, MRN [id number]  PCP:  Gayland Curry, DO  Cardiologist:  Elouise Munroe, MD  Electrophysiologist:  None   Referring MD: Gayland Curry, DO   Chief Complaint: f/u syncope  History of Present Illness:    Christina Lawson is a 86 y.o. female with a hx of hypertension, hypothyroidism, tremor, hyperlipidemia, prior TIA, prior syncope. No subsequent episodes of syncope.  She can't hear well or see well, and is troubled by anxiety, but overall doing ok from cardiovascular standpoint. She has asthma that seems to bother her at times. She describes a tightness in her chest occasionally, and a pressure when eating. She thinks it may be related to what she is eating. Fish and veggies are her main meal and she avoids fried food.  She exercises with the TV, and prior to covid enjoyed going to the pool, which is currently closed.   She has had two mechanical falls in the recent past, and is moving to assisted living soon. She denies significant trauma. She continues on eliquis for afib, without bleeding event.   Past Medical History:  Diagnosis Date  . Allergic rhinitis    pollen and grass  . Asthma   . Atrial fibrillation ([place]) 05/13/2018  . Blepharitis   . Carpal tunnel syndrome, bilateral 07/03/2016  . Family history of adverse reaction to anesthesia    " [place] ([place] ) [place]   . Hyperlipidemia   . Irregular heart rhythm   . Macular degeneration 2012  . Osteoporosis   . Sensorineural hearing loss, bilateral 2007  . Shingles 2004  . Skin cancer 2005   nose  . Torn meniscus 2006   right knee  . Tremor 2006   familiar.  dx by Dr. Quillian Quince  . Unspecified essential hypertension   . Unspecified hypothyroidism   . Urinary frequency     Past Surgical History:  Procedure Laterality Date  . ABDOMINAL HYSTERECTOMY  1977  . CATARACT EXTRACTION  1974   Left, IOL implants  . CATARACT EXTRACTION Right 1979   IOL implants  . CHOLECYSTECTOMY  1981  . COSMETIC SURGERY  1995   eye  . [place]  . DILATION AND CURETTAGE OF UTERUS  1977  . [place]  . HIATAL HERNIA REPAIR  1977  . SKIN CANCER EXCISION  2005   nose  . THYROIDECTOMY  1955  . TONSILLECTOMY  1931    Current Medications: Current Meds  Medication Sig  . albuterol (PROVENTIL HFA;VENTOLIN HFA) 108 (90 Base) MCG/ACT inhaler Inhale 1 puff into the lungs every 6 (six) hours as needed for wheezing.   Marland Kitchen apixaban (ELIQUIS) 2.5 MG TABS tablet Take 1 tablet (2.5 mg total) by mouth 2 (two) times daily.  . Azelastine HCl 0.15 % SOLN USE 1 SPRAY NASALLY TWICE A DAY  . budesonide-formoterol (SYMBICORT) 160-4.5 MCG/ACT inhaler Inhale 2 puffs into the lungs 2 (two) times daily.  . calcium carbonate (TUMS - DOSED IN MG ELEMENTAL CALCIUM) 500 MG chewable tablet Chew 1 tablet by mouth daily as needed for indigestion.  . Cholecalciferol (VITAMIN D) 2000 units CAPS Take 1 capsule (2,000 Units total) by mouth daily.  . Diclofenac Sodium 1 % CREA Place 1 application onto the skin as needed.  . docusate sodium (COLACE) 100 MG capsule Take 100 mg by mouth daily.    . fluticasone (  FLONASE) 50 MCG/ACT nasal spray Place 2 sprays into both nostrils daily.  Marland Kitchen loratadine (CLARITIN) 10 MG tablet Take 1 tablet (10 mg total) by mouth daily.  . metoprolol succinate (TOPROL-XL) 25 MG 24 hr tablet Take 0.5 tablets (12.5 mg total) by mouth daily.  . minoxidil (ROGAINE) 2 % external solution Apply topically daily. To regrow hair  . Multiple Vitamins-Minerals (PRESERVISION AREDS PO) Take 1 tablet by mouth 2 (two) times daily.  Marland Kitchen Propylene Glycol (SYSTANE COMPLETE OP) Apply to eye.  . Saline (SIMPLY SALINE) 0.9 % AERS Place 1 spray into the nose daily.   . sertraline (ZOLOFT) 50 MG tablet Take 1 tablet (50 mg total) by mouth daily.     Allergies:   Clindamycin/lincomycin, Hemorrhoid  preparation [diperodon], Ibuprofen, and Penicillins   Social History   Socioeconomic History  . Marital status: Widowed    Spouse name: Not on file  . Number of children: 2  . Years of education: college  . Highest education level: Not on file  Occupational History  . Occupation: retired Education officer, museum    Comment: retired  Tobacco Use  . Smoking status: Never Smoker  . Smokeless tobacco: Never Used  Substance and Sexual Activity  . Alcohol use: Yes    Comment: on occ.  . Drug use: No  . Sexual activity: Not Currently  Other Topics Concern  . Not on file  Social History Narrative   Lives at [facility]. Married 1958.   Never smoked   Alcohol minimal   Exercise predominantly walking, water aerobic   Retired Education officer, museum      Social Determinants of Radio broadcast assistant Strain:   . Difficulty of Paying Living Expenses: Not on file  Food Insecurity:   . Worried About Charity fundraiser in the Last Year: Not on file  . Ran Out of Food in the Last Year: Not on file  Transportation Needs:   . Lack of Transportation (Medical): Not on file  . Lack of Transportation (Non-Medical): Not on file  Physical Activity:   . Days of Exercise per Week: Not on file  . Minutes of Exercise per Session: Not on file  Stress:   . Feeling of Stress : Not on file  Social Connections:   . Frequency of Communication with Friends and Family: Not on file  . Frequency of Social Gatherings with Friends and Family: Not on file  . Attends Religious Services: Not on file  . Active Member of Clubs or Organizations: Not on file  . Attends Archivist Meetings: Not on file  . Marital Status: Not on file     Family History: The patient's family history includes Allergies in her father; Asthma in her father; Brain cancer in her mother; Breast cancer in her cousin, cousin, maternal aunt, maternal aunt, paternal aunt, and sister; Breast cancer (age of onset: 85) in her  sister; Breast cancer (age of onset: 29) in her daughter; Cancer in her mother, sister, and sister; Emphysema in her father; Endometrial cancer in her sister; Heart disease in her maternal grandfather; Hypertension in her mother; Stroke in an other family member.  ROS:   Please see the history of present illness.    All other systems reviewed and are negative.  EKGs/Labs/Other Studies Reviewed:    The following studies were reviewed today:  EKG:    Recent Labs: 05/13/2018: TSH 4.418 12/17/2018: ALT 20; BUN 19; Creatinine 0.8; Hemoglobin 39.4; Platelets 207; Potassium  4.6; Sodium 141  Recent Lipid Panel    Component Value Date/Time   CHOL 211 (H) 05/14/2018 0219   TRIG 61 05/14/2018 0219   HDL 53 05/14/2018 0219   CHOLHDL 4.0 05/14/2018 0219   VLDL 12 05/14/2018 0219   LDLCALC 146 (H) 05/14/2018 0219    Physical Exam:    VS:  BP (!) 142/69   Pulse 65   Temp (!) 92 F (33.3 C)   Ht 5' (1.524 m)   Wt 133 lb (60.3 kg)   SpO2 97%   BMI 25.97 kg/m     Wt Readings from Last 5 Encounters:  04/11/19 133 lb (60.3 kg)  03/26/19 138 lb 6.4 oz (62.8 kg)  12/25/18 133 lb (60.3 kg)  10/31/18 132 lb 3.2 oz (60 kg)  08/29/18 130 lb (59 kg)     Constitutional: No acute distress Eyes: sclera non-icteric, normal conjunctiva and lids ENMT: normal dentition, moist mucous membranes Cardiovascular: regular rhythm, normal rate, no murmurs. S1 and S2 normal. No jugular venous distention.  Respiratory: clear to auscultation bilaterally GI : normal bowel sounds, soft and nontender. No distention.   MSK: extremities warm, well perfused. No edema.  NEURO: grossly nonfocal exam, moves all extremities. PSYCH: alert and oriented x 3, normal mood and affect.   ASSESSMENT:    1. Paroxysmal atrial fibrillation (HCC)   2. Essential hypertension   3. Syncope, unspecified syncope type   4. Chest pain, unspecified type    PLAN:    PAF - continues on metoprolol and eliquis. Currently in sinus,  no significant palpitations. Continue to observe. Will use eliquis with caution going forward given a history of increasing falls and possible parkinsonism.   Syncope - no recurrent episodes  Chest tightness-  She thinks it's related to food. We will observe, if chest tightness recurs or worsens we discussed lexiscan myoview stress test. She will call me if she is concerned.  HTN - on metoprolol. Blood pressure with reasonable control. Will avoid aggressive control to avoid hypotension, syncope.    Total time of encounter: 30 minutes total time of encounter, including 20 minutes spent in face-to-face patient care. This time includes coordination of care and counseling regarding above issues. Remainder of non-face-to-face time involved reviewing chart documents/testing relevant to the patient encounter and documentation in the medical record.  Cherlynn Kaiser, MD   [facility]   Medication Adjustments/Labs and Tests Ordered: Current medicines are reviewed at length with the patient today.  Concerns regarding medicines are outlined above.  Orders Placed This Encounter  Procedures  . EKG 12-Lead   No orders of the defined types were placed in this encounter.   Patient Instructions  Medication Instructions:  No changes  *If you need a refill on your cardiac medications before your next appointment, please call your pharmacy*  Lab Work: Not needed   Testing/Procedures:  not   needed  Follow-Up: At [facility], you and your health needs are our priority.  As part of our continuing mission to provide you with exceptional heart care, we have created designated Provider Care Teams.  These Care Teams include your primary Cardiologist (physician) and Advanced Practice Providers (APPs -  Physician Assistants and Nurse Practitioners) who all work together to provide you with the care you need, when you need it.  Your next appointment:   6 month(s)- July 2021  The  format for your next appointment:   In Person  Provider:   Cherlynn Kaiser, MD  Other  Instructions n/a

## 2019-04-22 DIAGNOSIS — Z23 Encounter for immunization: Secondary | ICD-10-CM | POA: Diagnosis not present

## 2019-04-29 DIAGNOSIS — L820 Inflamed seborrheic keratosis: Secondary | ICD-10-CM | POA: Diagnosis not present

## 2019-04-29 DIAGNOSIS — L814 Other melanin hyperpigmentation: Secondary | ICD-10-CM | POA: Diagnosis not present

## 2019-04-29 DIAGNOSIS — L821 Other seborrheic keratosis: Secondary | ICD-10-CM | POA: Diagnosis not present

## 2019-04-29 DIAGNOSIS — L570 Actinic keratosis: Secondary | ICD-10-CM | POA: Diagnosis not present

## 2019-05-01 ENCOUNTER — Ambulatory Visit: Payer: Medicare Other | Admitting: Allergy & Immunology

## 2019-05-13 ENCOUNTER — Ambulatory Visit (INDEPENDENT_AMBULATORY_CARE_PROVIDER_SITE_OTHER): Payer: Medicare Other | Admitting: Allergy & Immunology

## 2019-05-13 ENCOUNTER — Encounter: Payer: Self-pay | Admitting: Allergy & Immunology

## 2019-05-13 ENCOUNTER — Other Ambulatory Visit: Payer: Self-pay

## 2019-05-13 VITALS — BP 150/68 | HR 67 | Temp 97.4°F | Resp 20 | Ht 59.5 in | Wt 134.0 lb

## 2019-05-13 DIAGNOSIS — J4540 Moderate persistent asthma, uncomplicated: Secondary | ICD-10-CM

## 2019-05-13 DIAGNOSIS — J302 Other seasonal allergic rhinitis: Secondary | ICD-10-CM

## 2019-05-13 NOTE — Progress Notes (Signed)
FOLLOW UP  Date of Service/Encounter:  05/13/19   Assessment:   Moderate persistent asthma without complication   Seasonal allergic rhinitis  TIA(summer 2019)   Christina Lawson is doing fairly well today.  She does feel like she is having more asthma symptoms without the Breo on board.  She is using the Symbicort as recommended, but feels that the [facility] provided more controlled.  She has been using her rescue inhaler more often, although she has not needed prednisone or emergency room visits.  We are going to try to get the [facility] on a better tear to make this more affordable for her.  We provided her with a sample today to use for couple weeks so that she can give [place] a report on how she feels with the [facility] on board compared to the Symbicort.  Plan/Recommendations:   1. Mild persistent asthma, uncomplicated - Lung testing looked fantastic today. - Continue with the Breo one puff once daily. - Continue with albuterol 2-4 puffs every 4-6 hours as needed.   2. Allergic rhinoconjunctivitis - Continue with fluticasone and azelastine: do one spray per nostril twice daily - Continue with Claritin (lortadine) as needed.   3. Return in about 6 months (around 11/13/2019). This can be an in-person, a virtual Webex or a telephone follow up visit.   Subjective:   Christina Lawson is a 86 y.o. female presenting today for follow up of  Chief Complaint  Patient presents with  . Asthma  . Follow-up    Christina Lawson has a history of the following: Patient Active Problem List   Diagnosis Date Noted  . Psychophysiological insomnia 03/26/2019  . Paroxysmal atrial fibrillation ([place]) 08/21/2018  . NSTEMI (non-ST elevated myocardial infarction) ([place]) 05/15/2018  . Chest pain   . Atrial fibrillation with RVR ([place]) 05/13/2018  . Acquired hypothyroidism 05/13/2018  . Left thigh pain 10/10/2017  . Anxiety 10/10/2017  . H/O TIA (transient ischemic attack) and stroke 10/10/2017  .  Moderate persistent asthma without complication [id number]  . Parkinsonism ([place]) 04/11/2017  . Mild persistent asthma without complication [id number]  . Seasonal allergic rhinitis 04/05/2017  . Carpal tunnel syndrome, bilateral 07/03/2016  . Numbness and tingling in both hands 06/05/2016  . Drooling 04/12/2016  . Cornea guttata 06/03/2015  . Posterior vitreous detachment of both eyes 06/03/2015  . Hyperglycemia 03/24/2015  . Senile osteopenia 03/24/2015  . 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 Murmur, cardiac 03/24/2015  . Conjunctivitis 01/12/2014  . Edema 08/20/2013  . Senile ecchymosis 06/23/2013  . Rash and nonspecific skin eruption 03/26/2013  . Pain in left knee 01/06/2013  . Osteopenia 12/16/2012  . Urinary frequency 09/23/2012  . Impacted cerumen 09/23/2012  . Essential hypertension   . Hyperlipidemia   . Tremor, essential   . Nonexudative age-related macular degeneration 05/23/2012  . Status post intraocular lens implant 05/23/2012  . Cardiomegaly - hypertensive 08/10/2010  . Asthma, intrinsic 05/31/2010  . Dyspnea 05/31/2010    History obtained from: chart review and patient.  Christina Lawson is a 86 y.o. female presenting for a follow up visit.  She was last seen in August 2020.  At that time, her lung testing looked fantastic.  We continued her on Breo 1 puff once daily with albuterol 2 to 4 puffs every 4-6 hours as needed.  For her allergic rhinoconjunctivitis, we continue with Flonase and Astelin.  We also continue with Claritin as needed.  She was doing very well status post her transient ischemic attack.   In  the interim, her [employer] [facility] went up to $400 a month and we changed her over to Symbicort instead.  This was still fairly expensive.  Since last visit, she has done well.  She is going to be moving from independent living to assisted living within her retirement community [employer]).  She is not excited about this as she is needing to go through a lot of extra furniture  and belongings since her space will be a lot smaller.  Asthma/Respiratory Symptom History: She remains on the Symbicort 2 puffs in the morning and 2 puffs at night.  She is using a spacer but does not feel this works as well as the [employer].  She has been using her rescue inhaler somewhat more regularly. She has not required any prednisone or ED visits however, for her breathing. ACT is 21 today, indicating excellent asthma control.   Allergic Rhinitis Symptom History: She has been using her nose spray and her antihistamine as needed.  She has not had any antibiotics since last visit.  She does need some refills of the nose spray.  Otherwise, there have been no changes to her past medical history, surgical history, family history, or social history.    Review of Systems  Constitutional: Negative.  Negative for chills, fever, malaise/fatigue and weight loss.  HENT: Negative.  Negative for congestion, ear discharge and ear pain.   Eyes: Negative for pain, discharge and redness.  Respiratory: Negative for cough, sputum production, shortness of breath and wheezing.   Cardiovascular: Negative.  Negative for chest pain and palpitations.  Gastrointestinal: Negative for abdominal pain, constipation, diarrhea, heartburn, nausea and vomiting.  Skin: Negative.  Negative for itching and rash.  Neurological: Negative for dizziness and headaches.  Endo/Heme/Allergies: Negative for environmental allergies. Does not bruise/bleed easily.       Objective:   Blood pressure (!) 150/68, pulse 67, temperature (!) 97.4 F (36.3 C), temperature source Temporal, resp. rate 20, height 4' 11.5" (1.511 m), weight 134 lb (60.8 kg), SpO2 97 %. Body mass index is 26.61 kg/m.   Physical Exam:  Physical Exam  Constitutional: She appears well-developed.  Talkative as always.  Very chipper.  HENT:  Head: Normocephalic and atraumatic.  Right Ear: Tympanic membrane, external ear and ear canal normal.  Left Ear:  Tympanic membrane, external ear and ear canal normal.  Nose: No mucosal edema, rhinorrhea, nasal deformity or septal deviation. No epistaxis. Right sinus exhibits no maxillary sinus tenderness and no frontal sinus tenderness. Left sinus exhibits no maxillary sinus tenderness and no frontal sinus tenderness.  Mouth/Throat: Uvula is midline and oropharynx is clear and moist. Mucous membranes are not pale and not dry.  Hearing aids in place bilaterally.  Eyes: Pupils are equal, round, and reactive to light. Conjunctivae and EOM are normal. Right eye exhibits no chemosis and no discharge. Left eye exhibits no chemosis and no discharge. Right conjunctiva is not injected. Left conjunctiva is not injected.  Cardiovascular: Normal rate, regular rhythm and normal heart sounds.  Respiratory: Effort normal and breath sounds normal. No accessory muscle usage. No tachypnea. No respiratory distress. She has no wheezes. She has no rhonchi. She has no rales. She exhibits no tenderness.  Moving air well in all lung fields.  Lymphadenopathy:    She has no cervical adenopathy.  Neurological: She is alert.  Skin: No abrasion, no petechiae and no rash noted. Rash is not papular, not vesicular and not urticarial. No erythema. No pallor.  No eczematous or urticarial lesions noted.  Psychiatric: She has a normal mood and affect.     Diagnostic studies:    Spirometry: results normal (FEV1: 1.19/118%, FVC: 1.44/102%, FEV1/FVC: 83%).    Spirometry consistent with normal pattern.   Allergy Studies: none        Salvatore Marvel, MD  [facility]

## 2019-05-13 NOTE — Patient Instructions (Addendum)
1. Mild persistent asthma, uncomplicated - Lung testing looked fantastic today. - We will give you a sample of Breo to use for a couple of weeks. - Call us and let us know how it is going after two weeks and we can make a letter for your insurance company.  - Continue with albuterol 2-4 puffs every 4-6 hours as needed.   2. Allergic rhinoconjunctivitis - Continue with fluticasone and azelastine: do one spray per nostril twice daily - Continue with Claritin (lortadine) as needed.   3. Return in about 6 months (around 11/13/2019). This can be an in-person, a virtual Webex or a telephone follow up visit.   Please inform us of any Emergency Department visits, hospitalizations, or changes in symptoms. Call us before going to the ED for breathing or allergy symptoms since we might be able to fit you in for a sick visit. Feel free to contact us anytime with any questions, problems, or concerns.  It was a pleasure to see you again today!  Websites that have reliable patient information: 1. American Academy of Asthma, Allergy, and Immunology: www.aaaai.org 2. Food Allergy Research and Education (FARE): foodallergy.org 3. Mothers of Asthmatics: http://www.asthmacommunitynetwork.org 4. American College of Allergy, Asthma, and Immunology: www.acaai.org   COVID-19 Vaccine Information can be found at: [email or web address].co.uk For questions related to vaccine distribution or appointments, please email [email or web address] or call [phone].     "Like" [place] on Facebook and Instagram for our latest updates!        Make sure you are registered to vote! If you have moved or changed any of your contact information, you will need to get this updated before voting!  In some cases, you MAY be able to register to vote online: [email or web address]

## 2019-05-20 ENCOUNTER — Other Ambulatory Visit: Payer: Self-pay | Admitting: Internal Medicine

## 2019-05-20 ENCOUNTER — Non-Acute Institutional Stay: Payer: Medicare Other | Admitting: Internal Medicine

## 2019-05-20 ENCOUNTER — Encounter: Payer: Self-pay | Admitting: Internal Medicine

## 2019-05-20 DIAGNOSIS — J45909 Unspecified asthma, uncomplicated: Secondary | ICD-10-CM

## 2019-05-20 DIAGNOSIS — G20C Parkinsonism, unspecified: Secondary | ICD-10-CM

## 2019-05-20 DIAGNOSIS — I480 Paroxysmal atrial fibrillation: Secondary | ICD-10-CM | POA: Diagnosis not present

## 2019-05-20 DIAGNOSIS — I10 Essential (primary) hypertension: Secondary | ICD-10-CM

## 2019-05-20 DIAGNOSIS — R200 Anesthesia of skin: Secondary | ICD-10-CM

## 2019-05-20 DIAGNOSIS — G20 Parkinson's disease: Secondary | ICD-10-CM | POA: Diagnosis not present

## 2019-05-20 DIAGNOSIS — F419 Anxiety disorder, unspecified: Secondary | ICD-10-CM | POA: Diagnosis not present

## 2019-05-20 MED ORDER — FLUTICASONE FUROATE-VILANTEROL 200-25 MCG/INH IN AEPB: 1.0000 | INHALATION_SPRAY | Freq: Every day | RESPIRATORY_TRACT | 0 refills | Status: DC

## 2019-05-20 NOTE — Progress Notes (Signed)
Patient ID: Christina Lawson, female   DOB: 1923-03-21, 86 y.o.   MRN: [id number]   Provider:  Elmarie Shiley L. Renato Gails, D.O., C.M.D. Location:  [facility] Room Number: 624 Place of Service:  ALF (13)  PCP: Kermit Balo, DO Patient Care Team: Kermit Balo, DO as PCP - General (Geriatric Medicine) Parke Poisson, MD as PCP - Cardiology (Cardiology) [facility] Clance, Maree Krabbe, MD as Consulting Physician (Pulmonary Disease) Daleen Squibb, Jesse Sans, MD (Inactive) as Consulting Physician (Cardiology) Ranee Gosselin, MD as Consulting Physician (Orthopedic Surgery) York Spaniel, MD as Consulting Physician (Neurology) Haverstock, Elvin So, MD as Referring Physician (Dermatology) Baxter Hire, MD (Inactive) as Consulting Physician (Allergy and Immunology) Mckinley Jewel, MD as Consulting Physician (Ophthalmology) Huel Cote, MD as Consulting Physician (Obstetrics and Gynecology) Ermalinda Barrios, MD as Consulting Physician (Otolaryngology) Landry Mellow, AUD (Audiology) Armanda Heritage (Audiology)  Extended Emergency Contact Information Primary Emergency Contact: Forestine Chute Home Phone: [phone] Relation: Daughter Secondary Emergency Contact: Rossi,Lynn Address: [address]          [place] [phone] [place] of [place] Home Phone: [phone] Mobile Phone: [phone] Relation: Daughter  Code Status: DNR, MOST Goals of Care: Advanced Directive information Advanced Directives 05/20/2019  Does Patient Have a Medical Advance Directive? -  Type of Estate agent of [place];Out of facility DNR (pink MOST or yellow form)  Does patient want to make changes to medical advance directive? No - Patient declined  Copy of Healthcare Power of Attorney in Chart? Yes - validated most recent copy scanned in chart (See row information)  Pre-existing out of facility DNR order (yellow  form or pink MOST form) Pink MOST form placed in chart (order not valid for inpatient use)    Chief Complaint  Patient presents with  . new admit to AL    [place] from [place]    HPI: Patient is a 86 y.o. female seen today for admission to [facility] from [place] due to declining vision, hearing, some mild cognitive impairment.  When seen in her apt, she was adjusting well to AL.  She's been isolating per facility protocol and is tired of that.  She had no physical complaints.    Breathing has been better with breo though she noted her dyspnea more amid her move due to increase physical activity (especially after decreased activity amid covid).  She did not do as well with symbicort.  There had been a cost concern with breo. She wonders if it would be cheaper with express scripts vs [place].    Past Medical History:  Diagnosis Date  . Allergic rhinitis    pollen and grass  . Asthma   . Atrial fibrillation (HCC) 05/13/2018  . Blepharitis   . Carpal tunnel syndrome, bilateral 07/03/2016  . Family history of adverse reaction to anesthesia    " MY FIRST COUNSINS CHILD (79YR OLD ) DIED UNDER ANESTHESTIA   . Hyperlipidemia   . Irregular heart rhythm   . Macular degeneration 2012  . Osteoporosis   . Sensorineural hearing loss, bilateral 2007  . Shingles 2004  . Skin cancer 2005   nose  . Torn meniscus 2006   right knee  . Tremor 2006   familiar.  dx by Dr. Hedy Camara  . Unspecified essential hypertension   . Unspecified hypothyroidism   . Urinary frequency    Past Surgical History:  Procedure Laterality Date  . ABDOMINAL HYSTERECTOMY  1977  .  CATARACT EXTRACTION  1974   Left, IOL implants  . CATARACT EXTRACTION Right 1979   IOL implants  . CHOLECYSTECTOMY  1981  . COSMETIC SURGERY  1995   eye  . CYSTOCELE REPAIR  1993  . DILATION AND CURETTAGE OF UTERUS  1977  . GALLBLADDER SURGERY  1981  . HIATAL HERNIA REPAIR  1977  . SKIN CANCER EXCISION  2005   nose  .  THYROIDECTOMY  1955  . TONSILLECTOMY  1931    Social History   Socioeconomic History  . Marital status: Widowed    Spouse name: Not on file  . Number of children: 2  . Years of education: college  . Highest education level: Not on file  Occupational History  . Occupation: retired Child psychotherapist    Comment: retired  Tobacco Use  . Smoking status: Never Smoker  . Smokeless tobacco: Never Used  Substance and Sexual Activity  . Alcohol use: Yes    Comment: on occ.  . Drug use: No  . Sexual activity: Not Currently  Other Topics Concern  . Not on file  Social History Narrative   Lives at [facility]. Married 1958.   Never smoked   Alcohol minimal   Exercise predominantly walking, water aerobic   Retired Child psychotherapist      Social Determinants of Corporate investment banker Strain:   . Difficulty of Paying Living Expenses: Not on file  Food Insecurity:   . Worried About Programme researcher, broadcasting/film/video in the Last Year: Not on file  . Ran Out of Food in the Last Year: Not on file  Transportation Needs:   . Lack of Transportation (Medical): Not on file  . Lack of Transportation (Non-Medical): Not on file  Physical Activity:   . Days of Exercise per Week: Not on file  . Minutes of Exercise per Session: Not on file  Stress:   . Feeling of Stress : Not on file  Social Connections:   . Frequency of Communication with Friends and Family: Not on file  . Frequency of Social Gatherings with Friends and Family: Not on file  . Attends Religious Services: Not on file  . Active Member of Clubs or Organizations: Not on file  . Attends Banker Meetings: Not on file  . Marital Status: Not on file    reports that she has never smoked. She has never used smokeless tobacco. She reports current alcohol use. She reports that she does not use drugs.  Functional Status Survey:    Family History  Problem Relation Age of Onset  . Emphysema Father   . Allergies Father   .  Asthma Father   . Brain cancer Mother   . Cancer Mother        Brain  . Hypertension Mother   . Cancer Sister        Cervical  . Endometrial cancer Sister   . Breast cancer Sister 77  . Cancer Sister        Breast, metastasized to bone  . Breast cancer Sister        Lind Covert of age  . Breast cancer Daughter 76  . Heart disease Maternal Grandfather   . Stroke Other   . Breast cancer Maternal Aunt   . Breast cancer Paternal Aunt   . Breast cancer Cousin   . Breast cancer Maternal Aunt   . Breast cancer Cousin     Health Maintenance  Topic Date Due  .  TETANUS/TDAP  04/07/2025  . INFLUENZA VACCINE  Completed  . DEXA SCAN  Completed  . PNA vac Low Risk Adult  Completed    Allergies  Allergen Reactions  . Clindamycin/Lincomycin Diarrhea    diarrhea  . Hemorrhoid Preparation [Diperodon] Other (See Comments)    Listed on paper work  . Ibuprofen Other (See Comments)    Long time ago/ list on paper work  . Penicillins Other (See Comments)    Did it involve swelling of the face/tongue/throat, SOB, or low BP? Unknown Did it involve sudden or severe rash/hives, skin peeling, or any reaction on the inside of your mouth or nose? Unknown Did you need to seek medical attention at a hospital or doctor's office? Unknown When did it last happen? If all above answers are "NO", may proceed with cephalosporin use.    Outpatient Encounter Medications as of 05/20/2019  Medication Sig  . albuterol (PROVENTIL HFA;VENTOLIN HFA) 108 (90 Base) MCG/ACT inhaler Inhale 1 puff into the lungs every 6 (six) hours as needed for wheezing.   Marland Kitchen apixaban (ELIQUIS) 2.5 MG TABS tablet Take 1 tablet (2.5 mg total) by mouth 2 (two) times daily.  . Azelastine HCl 0.15 % SOLN USE 1 SPRAY NASALLY TWICE A DAY  . budesonide-formoterol (SYMBICORT) 160-4.5 MCG/ACT inhaler Inhale 2 puffs into the lungs 2 (two) times daily.  . calcium carbonate (TUMS - DOSED IN MG ELEMENTAL CALCIUM) 500 MG chewable tablet Chew 1  tablet by mouth daily as needed for indigestion.  . Cholecalciferol (VITAMIN D) 2000 units CAPS Take 1 capsule (2,000 Units total) by mouth daily.  . Diclofenac Sodium 1 % CREA Place 1 application onto the skin as needed.  . docusate sodium (COLACE) 100 MG capsule Take 100 mg by mouth daily.    . fluocinonide cream (LIDEX) 0.05 % Apply 1 application topically 2 (two) times daily. Apply to hair BID for thinning hair  . fluticasone (FLONASE) 50 MCG/ACT nasal spray Place 2 sprays into both nostrils daily.  Marland Kitchen loratadine (CLARITIN) 10 MG tablet Take 1 tablet (10 mg total) by mouth daily.  . metoprolol succinate (TOPROL-XL) 25 MG 24 hr tablet Take 0.5 tablets (12.5 mg total) by mouth daily.  . minoxidil (ROGAINE) 2 % external solution Apply topically daily. To regrow hair  . Multiple Vitamins-Minerals (PRESERVISION AREDS 2+MULTI VIT PO) Take by mouth.  . Multiple Vitamins-Minerals (PRESERVISION AREDS PO) Take 1 tablet by mouth 2 (two) times daily.  Marland Kitchen Propylene Glycol (SYSTANE COMPLETE OP) Apply to eye.  . Saline (SIMPLY SALINE) 0.9 % AERS Place 1 spray into the nose daily.   . sertraline (ZOLOFT) 50 MG tablet Take 1 tablet (50 mg total) by mouth daily.   No facility-administered encounter medications on file as of 05/20/2019.    Review of Systems  Constitutional: Negative for chills, fever and malaise/fatigue.  HENT: Positive for hearing loss. Negative for congestion and sore throat.   Eyes: Positive for blurred vision.  Respiratory: Positive for shortness of breath. Negative for cough, sputum production and wheezing.   Cardiovascular: Negative for chest pain, palpitations and leg swelling.  Gastrointestinal: Negative for abdominal pain, blood in stool, constipation, diarrhea and melena.  Genitourinary: Negative for dysuria.  Musculoskeletal: Positive for joint pain. Negative for falls.  Skin: Negative for itching and rash.  Neurological: Negative for dizziness and loss of consciousness.    Endo/Heme/Allergies: Bruises/bleeds easily.  Psychiatric/Behavioral: Positive for memory loss. Negative for depression. The patient is nervous/anxious. The patient does not have insomnia.  Vitals:   05/20/19 1042  BP: 120/80  Pulse: 70  Temp: (!) 97.3 F (36.3 C)  SpO2: 93%  Weight: 134 lb (60.8 kg)  Height: 5' (1.524 m)   Body mass index is 26.17 kg/m. Physical Exam Vitals reviewed.  Constitutional:      General: She is not in acute distress.    Appearance: Normal appearance. She is not toxic-appearing.  HENT:     Head: Normocephalic and atraumatic.     Ears:     Comments: Hearing aids    Nose: Nose normal. No congestion.  Eyes:     Extraocular Movements: Extraocular movements intact.     Conjunctiva/sclera: Conjunctivae normal.     Pupils: Pupils are equal, round, and reactive to light.  Cardiovascular:     Rate and Rhythm: Normal rate and regular rhythm.     Pulses: Normal pulses.     Heart sounds: Normal heart sounds.  Pulmonary:     Effort: Pulmonary effort is normal.     Breath sounds: Normal breath sounds. No wheezing.  Abdominal:     General: Bowel sounds are normal. There is no distension.     Palpations: There is no mass.     Tenderness: There is no abdominal tenderness. There is no guarding or rebound.  Musculoskeletal:        General: Normal range of motion.     Right lower leg: No edema.     Left lower leg: No edema.  Skin:    General: Skin is warm and dry.  Neurological:     General: No focal deficit present.     Mental Status: She is alert and oriented to person, place, and time. Mental status is at baseline.     Cranial Nerves: No cranial nerve deficit.     Motor: No weakness.     Gait: Gait abnormal.     Comments: Essential tremor; numb area around left knee  Psychiatric:        Mood and Affect: Mood normal.     Labs reviewed: Basic Metabolic Panel: Recent Labs    12/17/18 0000  NA 141  K 4.6  CL 103  CO2 24  BUN 19   CREATININE 0.8  CALCIUM 9.5   Liver Function Tests: Recent Labs    12/17/18 0000  AST 22  ALT 20  ALKPHOS 54  PROT 6.4  ALBUMIN 3.9   No results for input(s): LIPASE, AMYLASE in the last 8760 hours. No results for input(s): AMMONIA in the last 8760 hours. CBC: Recent Labs    12/17/18 0000  WBC 4.6  HGB 39.4*  HCT 39  PLT 207   Cardiac Enzymes: No results for input(s): CKTOTAL, CKMB, CKMBINDEX, TROPONINI in the last 8760 hours. BNP: Invalid input(s): POCBNP Lab Results  Component Value Date   HGBA1C 5.9 10/02/2017   Lab Results  Component Value Date   TSH 4.418 05/13/2018   No results found for: VITAMINB12 No results found for: FOLATE No results found for: IRON, TIBC, FERRITIN  Imaging and Procedures obtained prior to SNF admission: DG Chest Port 1 View  Result Date: 05/13/2018 CLINICAL DATA:  Central chest pain and shortness of breath. EXAM: PORTABLE CHEST 1 VIEW COMPARISON:  Chest x-ray dated May 31, 2010. FINDINGS: Stable cardiomegaly. Normal pulmonary vascularity. Atherosclerotic calcification of the aortic arch. No focal consolidation, pleural effusion, or pneumothorax. No acute osseous abnormality. Unchanged moderate hiatal hernia. IMPRESSION: No active disease. Electronically Signed   By: Vickki Hearing.D.  On: 05/13/2018 11:14   ECHOCARDIOGRAM COMPLETE  Result Date: 05/14/2018   ECHOCARDIOGRAM REPORT   Patient Name:   KEYONNI LORAH Date of Exam: 05/14/2018 Medical Rec #:  [id number]          Height:       60.0 in Accession #:    [id number]         Weight:       124.2 lb Date of Birth:  10/11/1923           BSA:          1.52 m Patient Age:    89 years           BP:           140/70 mmHg Patient Gender: F                  HR:           66 bpm. Exam Location:  Inpatient  Procedure: 2D Echo Indications:    Elevated Troponin.  History:        Patient has prior history of Echocardiogram examinations, most                 recent 08/18/2010. Atrial Fibrillation;  Signs/Symptoms: Shortness                 of Breath; Risk Factors: Hypertension and Dyslipidemia.  Sonographer:    Thurman Coyer RDCS (AE) Referring Phys: [id number] JENNIFER YATES IMPRESSIONS  1. The left ventricle has normal systolic function, with an ejection fraction of 55-60%. The cavity size was mildly decreased. Left ventricular diastolic Doppler parameters are consistent with impaired relaxation The E/e' is 26.6. No evidence of left ventricular regional wall motion abnormalities.  2. No evidence of left ventricular regional wall motion abnormalities.  3. The right ventricle has normal systolic function. The cavity was normal. There is no increase in right ventricular wall thickness.  4. The mitral valve is normal in structure. Mild calcification of the mitral valve leaflet. There is moderate mitral annular calcification present. Mitral valve regurgitation is mild to moderate by color flow Doppler.  5. The tricuspid valve is normal in structure.  6. The aortic valve is tricuspid Mild calcification of the aortic valve.  7. The pulmonic valve was normal in structure. FINDINGS  Left Ventricle: The left ventricle has normal systolic function, with an ejection fraction of 55-60%. The cavity size was mildly decreased. There is no increase in left ventricular wall thickness. Left ventricular diastolic Doppler parameters are consistent with impaired relaxation The E/e' is 26.6. No evidence of left ventricular regional wall motion abnormalities.. Sigmoid basal septum, and M-mode images shows close proximity of basal anteroseptum and inferolateral walls on parasternal images. Minimal LVOT gradient noted. Right Ventricle: The right ventricle has normal systolic function. The cavity was normal. There is no increase in right ventricular wall thickness. Left Atrium: left atrial size was normal in size Right Atrium: right atrial size was normal in size. Right atrial pressure is estimated at 3 mmHg. Interatrial Septum: No  atrial level shunt detected by color flow Doppler. Pericardium: There is no evidence of pericardial effusion. Mitral Valve: The mitral valve is normal in structure. Mild calcification of the mitral valve leaflet. There is moderate mitral annular calcification present. Mitral valve regurgitation is mild to moderate by color flow Doppler. Tricuspid Valve: The tricuspid valve is normal in structure. Tricuspid valve regurgitation is trivial by color flow Doppler. Aortic Valve: The aortic valve is  tricuspid Mild calcification of the aortic valve. Aortic valve regurgitation was not visualized by color flow Doppler. There is no evidence of aortic valve stenosis. Pulmonic Valve: The pulmonic valve was normal in structure. Pulmonic valve regurgitation is not visualized by color flow Doppler. Venous: The inferior vena cava is normal in size with greater than 50% respiratory variability.  LEFT VENTRICLE PLAX 2D (Teich) LV EF:          53.9 %   Diastology LVIDd:          3.47 cm  LV e' lateral:   4.90 cm/s LVIDs:          2.53 cm  LV E/e' lateral: 26.9 LV PW:          0.98 cm  LV e' medial:    5.00 cm/s LV IVS:         0.97 cm  LV E/e' medial:  26.4 LVOT diam:      1.70 cm LV SV:          27 ml LVOT Area:      2.27 cm RIGHT VENTRICLE TAPSE (M-mode): 2.0 cm RVSP:           34.4 mmHg LEFT ATRIUM             Index       RIGHT ATRIUM           Index LA diam:        3.80 cm 2.49 cm/m  RA Pressure: 3 mmHg LA Vol (A2C):   31.8 ml 20.86 ml/m RA Area:     10.90 cm LA Vol (A4C):   40.2 ml 26.37 ml/m RA Volume:   18.00 ml  11.81 ml/m LA Biplane Vol: 38.9 ml 25.52 ml/m  AORTIC VALVE LVOT Vmax:   117.00 cm/s LVOT Vmean:  85.200 cm/s LVOT VTI:    0.274 m  AORTA Ao Root diam: 2.40 cm MITRAL VALVE               TRICUSPID VALVE MV Area (PHT): 2.66 cm    TR Peak grad:   31.4 mmHg MV PHT:        82.65 msec  TR Vmax:        280.00 cm/s MV Decel Time: 285 msec    RVSP:           34.4 mmHg MV E velocity: 132.00 cm/s MV A velocity: 158.00  cm/s MV E/A ratio:  0.84  Jodelle Red MD Electronically signed by Jodelle Red MD Signature Date/Time: 05/14/2018/9:36:49 AM    Final     Assessment/Plan 1. Parkinsonism, unspecified Parkinsonism type (HCC) -ongoing with some masked facies and tremor, shuffling gait, but was not felt by neurology to have primary parkinsonism (?vascular).  I originally thought her tremor was essential  2. Anxiety -cont zoloft therapy and avoid benzos due to her mild cognitive impairment and fall risk  3. Asthma, intrinsic -better with breo vs symbicort, continue breo and prn albuterol  4. Loss, sensation -over left knee region--odd distribution; never been clear what it came from--not bothersome at all times  5. Paroxysmal atrial fibrillation (HCC) -rate controlled at this time with toprol-xl, also doing well with her eliquis w/o bleeding complications  6. Essential hypertension -bp at goal with current therapy, cont same regimen and monitor -no dizziness reported  Family/ staff Communication: discussed with AL nurse  Labs/tests ordered:  No new  Debbra Digiulio L. Karder Goodin, D.O. Geriatrics [facility] [phone]  [place], [place] [address] Cell Phone (Mon-Fri 8am-5pm):  [phone] On Call:  [phone] & follow prompts after 5pm & weekends Office Phone:  [phone] Office Fax:  [phone]

## 2019-05-28 ENCOUNTER — Telehealth: Payer: Self-pay | Admitting: Allergy & Immunology

## 2019-05-28 NOTE — Telephone Encounter (Signed)
Patient called and said that she used the breo and is over $400.00 and she did not see that it did that much good so she would like to stay on the symbicort . [facility] pharmacy [phone]

## 2019-05-28 NOTE — Telephone Encounter (Signed)
Dr. Ernst Bowler please advise would you like for the patient to switch back to Symbicort. Please advise?

## 2019-05-30 MED ORDER — BUDESONIDE-FORMOTEROL FUMARATE 160-4.5 MCG/ACT IN AERO: 2.0000 | INHALATION_SPRAY | Freq: Two times a day (BID) | RESPIRATORY_TRACT | 5 refills | Status: DC

## 2019-05-30 NOTE — Telephone Encounter (Signed)
A new prescription has been sent to the patient's pharmacy. Called and advised to patient, patient verbalized understanding.

## 2019-05-30 NOTE — Telephone Encounter (Signed)
I am fine with changing back to Symbicort 160.4.5 mcg two puffs BID.  Salvatore Marvel, MD [facility] [place]

## 2019-05-30 NOTE — Addendum Note (Signed)
Addended by: Chip Boer R on: 05/30/2019 10:15 AM   Modules accepted: Orders

## 2019-05-30 NOTE — Telephone Encounter (Signed)
Also please tell her I said hello!!   Salvatore Marvel, MD [facility]

## 2019-06-03 ENCOUNTER — Telehealth: Payer: Self-pay | Admitting: *Deleted

## 2019-06-03 ENCOUNTER — Other Ambulatory Visit: Payer: Self-pay | Admitting: *Deleted

## 2019-06-03 NOTE — Telephone Encounter (Signed)
Received a fax from [facility] stating that the Symbicort was going to be $269.00 a month and the next preferred inhalers were Advair Diskus and Breo Ellipta. Called and spoke with [employer] and they stated that the Advair Diskus 500 would be $10 copay monthly and the Breo Ellipta would be $40 copay monthly. Spoke with Dr. Ernst Bowler and he wanted the patient to stay on the Breo Ellipta 100 1 puff once daily. Form has been completed for prescription, signed by Dr. Ernst Bowler and faxed to [facility] and [facility] where patient lives. Forms have been labeled and placed in bulk scanning for possible future reference. Called patient and advised of change in medication and the monthly copay for the [facility]. Patient verbalized understanding.

## 2019-06-09 NOTE — Addendum Note (Signed)
Addended by: Gayland Curry on: 06/09/2019 08:26 AM   Modules accepted: Level of Service

## 2019-06-27 ENCOUNTER — Telehealth: Payer: Self-pay | Admitting: *Deleted

## 2019-06-27 NOTE — Telephone Encounter (Signed)
Received a fax from [facility] stating that the patient's [facility] was going to cost over $500 a month and that Advair would only be $10 a month. Called and spoke with [facility] because last month they stated that [facility] would only be $30-40 a month in copay. They stated that her insurance company is out of network with their pharmacy but that if the patient or her representative called the insurance company and stated that she lives in a facility that they may wave that fee and that the [facility] would be more affordable. This was written down on the response sheet from [facility] and faxed back to their facility.

## 2019-07-02 DIAGNOSIS — L718 Other rosacea: Secondary | ICD-10-CM | POA: Diagnosis not present

## 2019-07-02 DIAGNOSIS — L814 Other melanin hyperpigmentation: Secondary | ICD-10-CM | POA: Diagnosis not present

## 2019-07-02 DIAGNOSIS — L853 Xerosis cutis: Secondary | ICD-10-CM | POA: Diagnosis not present

## 2019-07-02 DIAGNOSIS — L570 Actinic keratosis: Secondary | ICD-10-CM | POA: Diagnosis not present

## 2019-07-02 DIAGNOSIS — L821 Other seborrheic keratosis: Secondary | ICD-10-CM | POA: Diagnosis not present

## 2019-07-02 DIAGNOSIS — Z85828 Personal history of other malignant neoplasm of skin: Secondary | ICD-10-CM | POA: Diagnosis not present

## 2019-07-18 DIAGNOSIS — D649 Anemia, unspecified: Secondary | ICD-10-CM | POA: Diagnosis not present

## 2019-07-18 DIAGNOSIS — I10 Essential (primary) hypertension: Secondary | ICD-10-CM | POA: Diagnosis not present

## 2019-07-18 DIAGNOSIS — E785 Hyperlipidemia, unspecified: Secondary | ICD-10-CM | POA: Diagnosis not present

## 2019-07-18 LAB — BASIC METABOLIC PANEL
BUN: 17 (ref 4–21)
CO2: 24 — AB (ref 13–22)
Chloride: 105 (ref 99–108)
Creatinine: 0.7 (ref 0.5–1.1)
Glucose: 86
Potassium: 4.7 (ref 3.4–5.3)
Sodium: 141 (ref 137–147)

## 2019-07-18 LAB — CBC AND DIFFERENTIAL
HCT: 37 (ref 36–46)
Hemoglobin: 12.5 (ref 12.0–16.0)
Platelets: 191 (ref 150–399)
WBC: 4.9
WBC: 4.9

## 2019-07-18 LAB — HEPATIC FUNCTION PANEL
ALT: 13 (ref 7–35)
AST: 16 (ref 13–35)

## 2019-07-18 LAB — COMPREHENSIVE METABOLIC PANEL
Albumin: 3.7 (ref 3.5–5.0)
Calcium: 9.4 (ref 8.7–10.7)
Globulin: 2.1

## 2019-07-18 LAB — CBC
RBC: 3.89 (ref 3.87–5.11)
RBC: 3.89 (ref 3.87–5.11)

## 2019-07-22 ENCOUNTER — Other Ambulatory Visit: Payer: Self-pay | Admitting: Internal Medicine

## 2019-07-22 DIAGNOSIS — M545 Low back pain, unspecified: Secondary | ICD-10-CM

## 2019-07-22 DIAGNOSIS — G8929 Other chronic pain: Secondary | ICD-10-CM

## 2019-07-22 MED ORDER — HYDROCODONE-ACETAMINOPHEN 5-325 MG PO TABS: 1.0000 | ORAL_TABLET | Freq: Every day | ORAL | 0 refills | Status: DC | PRN

## 2019-07-30 ENCOUNTER — Encounter: Payer: Self-pay | Admitting: Internal Medicine

## 2019-07-30 ENCOUNTER — Other Ambulatory Visit: Payer: Self-pay

## 2019-07-30 ENCOUNTER — Non-Acute Institutional Stay: Payer: Medicare Other | Admitting: Internal Medicine

## 2019-07-30 VITALS — BP 122/78 | HR 61 | Temp 97.5°F | Ht 61.0 in | Wt 134.6 lb

## 2019-07-30 DIAGNOSIS — G20 Parkinson's disease: Secondary | ICD-10-CM | POA: Diagnosis not present

## 2019-07-30 DIAGNOSIS — L853 Xerosis cutis: Secondary | ICD-10-CM | POA: Diagnosis not present

## 2019-07-30 DIAGNOSIS — N3946 Mixed incontinence: Secondary | ICD-10-CM | POA: Insufficient documentation

## 2019-07-30 DIAGNOSIS — L570 Actinic keratosis: Secondary | ICD-10-CM | POA: Diagnosis not present

## 2019-07-30 DIAGNOSIS — L814 Other melanin hyperpigmentation: Secondary | ICD-10-CM | POA: Diagnosis not present

## 2019-07-30 DIAGNOSIS — F419 Anxiety disorder, unspecified: Secondary | ICD-10-CM

## 2019-07-30 DIAGNOSIS — L718 Other rosacea: Secondary | ICD-10-CM | POA: Diagnosis not present

## 2019-07-30 DIAGNOSIS — G20C Parkinsonism, unspecified: Secondary | ICD-10-CM

## 2019-07-30 NOTE — Progress Notes (Signed)
Location:  Occupational psychologist of Service:  Clinic (12)  Provider: Kensly Bowmer L. Mariea Clonts, D.O., C.M.D.  Code Status: DNR Goals of Care:  Advanced Directives 07/30/2019  Does Patient Have a Medical Advance Directive? Yes  Type of Advance Directive Out of facility DNR (pink MOST or yellow form);Living will  Does patient want to make changes to medical advance directive? No - Patient declined  Copy of [place] in Chart? Yes - validated most recent copy scanned in chart (See row information)  Pre-existing out of facility DNR order (yellow form or pink MOST form) -   Chief Complaint  Patient presents with  . Medical Management of Chronic Issues    4 month follow up/ overactive bladder, trimmer med and feeling antous    HPI: Patient is a 86 y.o. female seen today for medical management of chronic diseases.    She is having to go to the bathroom frequently and sometimes does not make it.  No set time.  Typically, the accidents are at night (overnight) and fortunately, in the apt.  Does not drink anything after dinnertime.  Has only a small amt with dinner.  She has not tried the kegel exercises.   Also has some leakage with cough or sneeze.  Uses a liner on her underwear.  Changes it daily.    Medication is not helping her tremor or feeling anxious.  What she took before helped more--used prn if somewhat stressed (was alprazolam).  She is on zoloft at this time for mood.  She says she does not know whether it's helpful and what she'd be like without it.  Does have her neurology appt next week.    She still worries about having parkinson's.  Encouraged exercise and discussing again with Dr. Jannifer Franklin. She is going to resume water exercise in the AL group.  Yesterday, it got canceled.   She may end up going back to the [place] group.     Past Medical History:  Diagnosis Date  . Allergic rhinitis    pollen and grass  . Asthma   . Atrial fibrillation ([place])  05/13/2018  . Blepharitis   . Carpal tunnel syndrome, bilateral 07/03/2016  . Family history of adverse reaction to anesthesia    " [place]   . Hyperlipidemia   . Irregular heart rhythm   . Macular degeneration 2012  . Osteoporosis   . Sensorineural hearing loss, bilateral 2007  . Shingles 2004  . Skin cancer 2005   nose  . Torn meniscus 2006   right knee  . Tremor 2006   familiar.  dx by Dr. Quillian Quince  . Unspecified essential hypertension   . Unspecified hypothyroidism   . Urinary frequency     Past Surgical History:  Procedure Laterality Date  . ABDOMINAL HYSTERECTOMY  1977  . CATARACT EXTRACTION  1974   Left, IOL implants  . CATARACT EXTRACTION Right 1979   IOL implants  . CHOLECYSTECTOMY  1981  . COSMETIC SURGERY  1995   eye  . [place]  . DILATION AND CURETTAGE OF UTERUS  1977  . [place]  . HIATAL HERNIA REPAIR  1977  . SKIN CANCER EXCISION  2005   nose  . THYROIDECTOMY  1955  . TONSILLECTOMY  1931    Allergies  Allergen Reactions  . Clindamycin/Lincomycin Diarrhea    diarrhea  . Hemorrhoid Preparation [Diperodon] Other (See  Comments)    Listed on paper work  . Ibuprofen Other (See Comments)    Long time ago/ list on paper work  . Penicillins Other (See Comments)    Did it involve swelling of the face/tongue/throat, SOB, or low BP? Unknown Did it involve sudden or severe rash/hives, skin peeling, or any reaction on the inside of your mouth or nose? Unknown Did you need to seek medical attention at a hospital or doctor's office? Unknown When did it last happen? If all above answers are "NO", may proceed with cephalosporin use.    Outpatient Encounter Medications as of 07/30/2019  Medication Sig  . albuterol (PROVENTIL HFA;VENTOLIN HFA) 108 (90 Base) MCG/ACT inhaler Inhale 1 puff into the lungs every 6 (six) hours as needed for wheezing.   Marland Kitchen apixaban (ELIQUIS) 2.5 MG  TABS tablet Take 1 tablet (2.5 mg total) by mouth 2 (two) times daily.  . Azelastine HCl 0.15 % SOLN USE 1 SPRAY NASALLY TWICE A DAY  . budesonide-formoterol (SYMBICORT) 160-4.5 MCG/ACT inhaler Inhale 2 puffs into the lungs 2 (two) times daily.  . calcium carbonate (TUMS - DOSED IN MG ELEMENTAL CALCIUM) 500 MG chewable tablet Chew 1 tablet by mouth daily as needed for indigestion.  . Cholecalciferol (VITAMIN D) 2000 units CAPS Take 1 capsule (2,000 Units total) by mouth daily.  . Diclofenac Sodium 1 % CREA Place 1 application onto the skin as needed.  . docusate sodium (COLACE) 100 MG capsule Take 100 mg by mouth daily.    . fluocinonide cream (LIDEX) AB-123456789 % Apply 1 application topically 2 (two) times daily. Apply to hair BID for thinning hair  . fluticasone (FLONASE) 50 MCG/ACT nasal spray Place 2 sprays into both nostrils daily.  . fluticasone furoate-vilanterol (BREO ELLIPTA) 200-25 MCG/INH AEPB Inhale 1 puff into the lungs daily.  Marland Kitchen HYDROcodone-acetaminophen (NORCO/VICODIN) 5-325 MG tablet Take 1 tablet by mouth daily as needed for severe pain (in back).  . loratadine (CLARITIN) 10 MG tablet Take 1 tablet (10 mg total) by mouth daily.  . metoprolol succinate (TOPROL-XL) 25 MG 24 hr tablet Take 0.5 tablets (12.5 mg total) by mouth daily.  . minoxidil (ROGAINE) 2 % external solution Apply topically daily. To regrow hair  . Multiple Vitamins-Minerals (PRESERVISION AREDS 2+MULTI VIT PO) Take by mouth.  . Multiple Vitamins-Minerals (PRESERVISION AREDS PO) Take 1 tablet by mouth 2 (two) times daily.  Marland Kitchen Propylene Glycol (SYSTANE COMPLETE OP) Apply to eye.  . Saline (SIMPLY SALINE) 0.9 % AERS Place 1 spray into the nose daily.   . sertraline (ZOLOFT) 50 MG tablet Take 1 tablet (50 mg total) by mouth daily.   No facility-administered encounter medications on file as of 07/30/2019.    Review of Systems:  Review of Systems  Constitutional: Negative for chills, fever and malaise/fatigue.  HENT:  Positive for hearing loss.   Eyes: Positive for redness.  Respiratory: Negative for shortness of breath.   Cardiovascular: Negative for chest pain, palpitations and leg swelling.  Gastrointestinal: Negative for abdominal pain, blood in stool, constipation and melena.  Genitourinary: Negative for dysuria.  Musculoskeletal: Negative for falls.       Left knee region numbness and tingling still occurs  Skin: Negative for itching and rash.  Neurological: Positive for tremors. Negative for dizziness and loss of consciousness.  Endo/Heme/Allergies: Bruises/bleeds easily.  Psychiatric/Behavioral: Positive for memory loss. Negative for depression. The patient is nervous/anxious. The patient does not have insomnia.     Health Maintenance  Topic Date Due  .  COVID-19 Vaccine (1) Never done  . INFLUENZA VACCINE  10/12/2019  . TETANUS/TDAP  04/07/2025  . DEXA SCAN  Completed  . PNA vac Low Risk Adult  Completed    Physical Exam: Vitals:   07/30/19 0928  BP: 122/78  Pulse: 61  Temp: (!) 97.5 F (36.4 C)  SpO2: 96%  Weight: 134 lb 9.6 oz (61.1 kg)  Height: 5\' 1"  (1.549 m)   Body mass index is 25.43 kg/m. Physical Exam Vitals reviewed.  Constitutional:      General: She is not in acute distress.    Appearance: Normal appearance. She is not toxic-appearing.  HENT:     Head: Normocephalic and atraumatic.     Right Ear: External ear normal.     Left Ear: External ear normal.     Ears:     Comments: Only wearing right hearing aid (left was not working this am)    Nose: Nose normal.     Mouth/Throat:     Pharynx: Oropharynx is clear.  Cardiovascular:     Rate and Rhythm: Normal rate and regular rhythm.     Heart sounds: Murmur present.  Pulmonary:     Effort: Pulmonary effort is normal.     Breath sounds: Normal breath sounds. No wheezing, rhonchi or rales.  Abdominal:     General: Bowel sounds are normal.  Musculoskeletal:        General: Normal range of motion.     Right  lower leg: No edema.     Left lower leg: No edema.  Skin:    General: Skin is warm and dry.  Neurological:     General: No focal deficit present.     Mental Status: She is alert and oriented to person, place, and time.     Comments: Left side with slight rigidity and decreased coordination vs right     Labs reviewed: Basic Metabolic Panel: Recent Labs    12/17/18 0000  NA 141  K 4.6  CL 103  CO2 24  BUN 19  CREATININE 0.8  CALCIUM 9.5   Liver Function Tests: Recent Labs    12/17/18 0000  AST 22  ALT 20  ALKPHOS 54  PROT 6.4  ALBUMIN 3.9   No results for input(s): LIPASE, AMYLASE in the last 8760 hours. No results for input(s): AMMONIA in the last 8760 hours. CBC: Recent Labs    12/17/18 0000  WBC 4.6  HGB 39.4*  HCT 39  PLT 207   Lipid Panel: No results for input(s): CHOL, HDL, LDLCALC, TRIG, CHOLHDL, LDLDIRECT in the last 8760 hours. Lab Results  Component Value Date   HGBA1C 5.9 10/02/2017    Assessment/Plan 1. Mixed incontinence urge and stress -given instructions for kegel exercises to try for 4-6 wks -if not effective enough, might try myrbetriq for her if affordable  2. Parkinsonism, unspecified Parkinsonism type ([place]) -seems to have some more features as time goes on though she has not been felt to have primary PD -has f/u with Dr. Jannifer Franklin coming up and advised to ask him about this -has diagnosis of essential tremor and feels like her xanax helped her more with this an the zoloft (zoloft was also for general control of anxiety in hopes she'd need less xanax not really to help the tremor)  3. Anxiety -for now, cont zoloft, pending what neurology says as next steps with tremor  Labs reviewed with her and good overall  Labs/tests ordered:  No new Next appt:  01/16/2020  Maesyn Frisinger L. Hever Castilleja, D.O. [facility] [address], [place] [address] Cell Phone (Mon-Fri 8am-5pm):  [phone] On Call:   [phone] & follow prompts after 5pm & weekends Office Phone:  [phone] Office Fax:  [phone]

## 2019-07-30 NOTE — Patient Instructions (Signed)

## 2019-08-01 ENCOUNTER — Encounter: Payer: Self-pay | Admitting: Internal Medicine

## 2019-08-04 ENCOUNTER — Other Ambulatory Visit: Payer: Self-pay

## 2019-08-04 ENCOUNTER — Ambulatory Visit (INDEPENDENT_AMBULATORY_CARE_PROVIDER_SITE_OTHER): Payer: Medicare Other | Admitting: Neurology

## 2019-08-04 ENCOUNTER — Encounter: Payer: Self-pay | Admitting: Neurology

## 2019-08-04 VITALS — BP 157/82 | HR 82 | Ht 60.0 in | Wt 132.0 lb

## 2019-08-04 DIAGNOSIS — G250 Essential tremor: Secondary | ICD-10-CM | POA: Diagnosis not present

## 2019-08-04 NOTE — Progress Notes (Signed)
I have read the note, and I agree with the clinical assessment and plan.  Charles K Willis   

## 2019-08-04 NOTE — Progress Notes (Signed)
PATIENT: Christina Lawson DOB: April 13, 1923  REASON FOR VISIT: follow up HISTORY FROM: patient  HISTORY OF PRESENT ILLNESS: Today 08/04/19  [name] 86 year old female with history of essential tremor.  The patient lives at [place].  She is on metoprolol for A. Fib, and Eliquis.  Since last seen, she has moved into AL, she says, "I knew it was time", getting older and had a few falls.  She has good and bad days with a tremor, in both hands, left greater than right.  Is worse during times of anxiety or stress.  She is on Zoloft for anxiety.  She is right-handed, may have difficulty with handwriting and with feeding.  She has not had recent fall.  She remains active, the facility activity director does virtual exercise classes daily, walking is limited in AL due to social distance restrictions.  She was going to the pool, but is now too far away for her.  Was previously on alprazolam as needed for tremor, but was stopped after she was hospitalized last year, wonders about going back on as needed for tremor.  She presents today for evaluation unaccompanied.  HISTORY 07/31/2018 Dr. Jannifer Franklin: Christina Lawson is a 86 year old right-handed white female with a history of an essential tremor.  The patient lives at [place], she has recently been in the hospital with chest pressure.  She was admitted on 13 May 2018 after she felt weak and short winded.  She was found to have atrial fibrillation with rapid ventricular response.  Her troponin levels topped out over 7, the patient was not felt to have had a myocardial infarction, she was felt to have had some heart strain related to the elevated heart rate.  The patient has been placed on metoprolol which concurrently has helped her tremor.  She has no longer taking alprazolam.  The patient notes that when she is under stress, the tremor is worse.  The patient does have some gait instability, she does not use a cane or a walker for ambulation.  She  does have a cane, however.  She has not had any falls.  She is trying to stay active, she is exercising on a regular basis.  She reports no other new medical issues that have come up since last seen.  She is now on anticoagulation, off of aspirin.   REVIEW OF SYSTEMS: Out of a complete 14 system review of symptoms, the patient complains only of the following symptoms, and all other reviewed systems are negative.  Tremor  ALLERGIES: Allergies  Allergen Reactions  . Clindamycin/Lincomycin Diarrhea    diarrhea  . Hemorrhoid Preparation [Diperodon] Other (See Comments)    Listed on paper work  . Ibuprofen Other (See Comments)    Long time ago/ list on paper work  . Penicillins Other (See Comments)    Did it involve swelling of the face/tongue/throat, SOB, or low BP? Unknown Did it involve sudden or severe rash/hives, skin peeling, or any reaction on the inside of your mouth or nose? Unknown Did you need to seek medical attention at a hospital or doctor's office? Unknown When did it last happen? If all above answers are "NO", may proceed with cephalosporin use.    HOME MEDICATIONS: Outpatient Medications Prior to Visit  Medication Sig Dispense Refill  . albuterol (PROVENTIL HFA;VENTOLIN HFA) 108 (90 Base) MCG/ACT inhaler Inhale 1 puff into the lungs every 6 (six) hours as needed for wheezing.     Marland Kitchen apixaban (ELIQUIS) 2.5 MG TABS tablet Take  1 tablet (2.5 mg total) by mouth 2 (two) times daily. 60 tablet 11  . Azelastine HCl 0.15 % SOLN USE 1 SPRAY NASALLY TWICE A DAY 90 mL 5  . calcium carbonate (TUMS - DOSED IN MG ELEMENTAL CALCIUM) 500 MG chewable tablet Chew 1 tablet by mouth daily as needed for indigestion.    . Cholecalciferol (VITAMIN D) 2000 units CAPS Take 1 capsule (2,000 Units total) by mouth daily. 30 capsule 3  . Diclofenac Sodium 1 % CREA Place 1 application onto the skin as needed. 120 g 0  . docusate sodium (COLACE) 100 MG capsule Take 100 mg by mouth daily.      .  fluticasone furoate-vilanterol (BREO ELLIPTA) 200-25 MCG/INH AEPB Inhale 1 puff into the lungs daily. 28 each 0  . HYDROcodone-acetaminophen (NORCO/VICODIN) 5-325 MG tablet Take 1 tablet by mouth daily as needed for severe pain (in back). 30 tablet 0  . loratadine (CLARITIN) 10 MG tablet Take 1 tablet (10 mg total) by mouth daily. 30 tablet 0  . metoprolol succinate (TOPROL-XL) 25 MG 24 hr tablet Take 0.5 tablets (12.5 mg total) by mouth daily. 15 tablet 11  . minoxidil (ROGAINE) 2 % external solution Apply topically daily. To regrow hair    . Multiple Vitamins-Minerals (PRESERVISION AREDS 2+MULTI VIT PO) Take by mouth.    . Multiple Vitamins-Minerals (PRESERVISION AREDS PO) Take 1 tablet by mouth 2 (two) times daily.    Marland Kitchen Propylene Glycol (SYSTANE COMPLETE OP) Apply to eye.    . Saline (SIMPLY SALINE) 0.9 % AERS Place 1 spray into the nose daily.     . sertraline (ZOLOFT) 50 MG tablet Take 1 tablet (50 mg total) by mouth daily. 30 tablet 3  . budesonide-formoterol (SYMBICORT) 160-4.5 MCG/ACT inhaler Inhale 2 puffs into the lungs 2 (two) times daily. 1 Inhaler 5  . fluocinonide cream (LIDEX) [id number] % Apply 1 application topically 2 (two) times daily. Apply to hair BID for thinning hair    . fluticasone (FLONASE) 50 MCG/ACT nasal spray Place 2 sprays into both nostrils daily. 1 g 5   No facility-administered medications prior to visit.    PAST MEDICAL HISTORY: Past Medical History:  Diagnosis Date  . Allergic rhinitis    pollen and grass  . Asthma   . Atrial fibrillation ([place]) 05/13/2018  . Blepharitis   . Carpal tunnel syndrome, bilateral 07/03/2016  . Family history of adverse reaction to anesthesia    " [place]   . Hyperlipidemia   . Irregular heart rhythm   . Macular degeneration 2012  . Osteoporosis   . Sensorineural hearing loss, bilateral 2007  . Shingles 2004  . Skin cancer 2005   nose  . Torn meniscus 2006   right knee  .  Tremor 2006   familiar.  dx by Dr. Quillian Quince  . Unspecified essential hypertension   . Unspecified hypothyroidism   . Urinary frequency     PAST SURGICAL HISTORY: Past Surgical History:  Procedure Laterality Date  . ABDOMINAL HYSTERECTOMY  1977  . CATARACT EXTRACTION  1974   Left, IOL implants  . CATARACT EXTRACTION Right 1979   IOL implants  . CHOLECYSTECTOMY  1981  . COSMETIC SURGERY  1995   eye  . [place]  . DILATION AND CURETTAGE OF UTERUS  1977  . [place]  . HIATAL HERNIA REPAIR  1977  . SKIN CANCER EXCISION  2005   nose  .  THYROIDECTOMY  1955  . TONSILLECTOMY  1931    FAMILY HISTORY: Family History  Problem Relation Age of Onset  . Emphysema Father   . Allergies Father   . Asthma Father   . Brain cancer Mother   . Cancer Mother        Brain  . Hypertension Mother   . Cancer Sister        Cervical  . Endometrial cancer Sister   . Breast cancer Sister 105  . Cancer Sister        Breast, metastasized to bone  . Breast cancer Sister        Cher Nakai of age  . Breast cancer Daughter 47  . Heart disease Maternal Grandfather   . Stroke Other   . Breast cancer Maternal Aunt   . Breast cancer Paternal Aunt   . Breast cancer Cousin   . Breast cancer Maternal Aunt   . Breast cancer Cousin     SOCIAL HISTORY: Social History   Socioeconomic History  . Marital status: Widowed    Spouse name: Not on file  . Number of children: 2  . Years of education: college  . Highest education level: Not on file  Occupational History  . Occupation: retired Education officer, museum    Comment: retired  Tobacco Use  . Smoking status: Never Smoker  . Smokeless tobacco: Never Used  Substance and Sexual Activity  . Alcohol use: Yes    Comment: on occ.  . Drug use: No  . Sexual activity: Not Currently  Other Topics Concern  . Not on file  Social History Narrative   Lives at [place]. Married 1958.   Never smoked   Alcohol  minimal   Exercise predominantly walking, water aerobic   Retired Education officer, museum      Social Determinants of Radio broadcast assistant Strain:   . Difficulty of Paying Living Expenses:   Food Insecurity:   . Worried About Charity fundraiser in the Last Year:   . Arboriculturist in the Last Year:   Transportation Needs:   . Film/video editor (Medical):   Marland Kitchen Lack of Transportation (Non-Medical):   Physical Activity:   . Days of Exercise per Week:   . Minutes of Exercise per Session:   Stress:   . Feeling of Stress :   Social Connections:   . Frequency of Communication with Friends and Family:   . Frequency of Social Gatherings with Friends and Family:   . Attends Religious Services:   . Active Member of Clubs or Organizations:   . Attends Archivist Meetings:   Marland Kitchen Marital Status:   Intimate Partner Violence:   . Fear of Current or Ex-Partner:   . Emotionally Abused:   Marland Kitchen Physically Abused:   . Sexually Abused:    PHYSICAL EXAM  Vitals:   08/04/19 1015  BP: (!) 157/82  Pulse: 82  Weight: 132 lb (59.9 kg)  Height: 5' (1.524 m)   Body mass index is 25.78 kg/m.  Generalized: Well developed, in no acute distress   Neurological examination  Mentation: Alert oriented to time, place, history taking. Follows all commands speech and language fluent Cranial nerve II-XII: Pupils were equal round reactive to light. Extraocular movements were full, visual field were full on confrontational test. Facial sensation and strength were normal. Head turning and shoulder shrug  were normal and symmetric. Motor: The motor testing reveals 5 over 5 strength of  all 4 extremities. Good symmetric motor tone is noted throughout. No resting tremor noted, mild postural tremor bilaterally, left more than right, no rigidity with reinforcement maneuver noted, no significant bradykinesia Sensory: Sensory testing is intact to soft touch on all 4 extremities. No evidence of extinction is  noted.  Coordination: Cerebellar testing reveals good finger-nose-finger and heel-to-shin bilaterally. Mild tremor bilaterally with finger-nose-finger Gait and station: Able to rise from seated position without pushoff, gait is slightly wide-based, good stride and arm swing, no assistive device, tandem gait was unsteady, Romberg is negative. Reflexes: Deep tendon reflexes are symmetric and normal bilaterally.   DIAGNOSTIC DATA (LABS, IMAGING, TESTING) - I reviewed patient records, labs, notes, testing and imaging myself where available.  Lab Results  Component Value Date   WBC 4.9 07/18/2019   WBC 4.9 07/18/2019   HGB 12.5 07/18/2019   HCT 37 07/18/2019   MCV 94.1 05/14/2018   PLT 191 07/18/2019      Component Value Date/Time   NA 141 07/18/2019 0300   K 4.7 07/18/2019 0300   CL 105 07/18/2019 0300   CL 103 12/17/2018 0000   CO2 24 (A) 07/18/2019 0300   CO2 24 12/17/2018 0000   GLUCOSE 97 05/14/2018 0219   BUN 17 07/18/2019 0300   CREATININE 0.7 07/18/2019 0300   CREATININE 0.90 05/14/2018 0219   CREATININE 0.79 01/24/2018 1401   CALCIUM 9.4 07/18/2019 0300   CALCIUM 9.5 12/17/2018 0000   PROT 6.4 12/17/2018 0000   ALBUMIN 3.7 07/18/2019 0300   ALBUMIN 3.9 12/17/2018 0000   AST 16 07/18/2019 0300   ALT 13 07/18/2019 0300   ALKPHOS 54 12/17/2018 0000   BILITOT 1.2 05/13/2018 1042   GFRNONAA 65.70 12/17/2018 0000   GFRNONAA 65 01/24/2018 1401   GFRAA >60 05/14/2018 0219   GFRAA 75 01/24/2018 1401   Lab Results  Component Value Date   CHOL 211 (H) 05/14/2018   HDL 53 05/14/2018   LDLCALC 146 (H) 05/14/2018   TRIG 61 05/14/2018   CHOLHDL 4.0 05/14/2018   Lab Results  Component Value Date   HGBA1C 5.9 10/02/2017   No results found for: [id number] Lab Results  Component Value Date   TSH 4.418 05/13/2018      ASSESSMENT AND PLAN 86 y.o. year old female  has a past medical history of Allergic rhinitis, Asthma, Atrial fibrillation ([place]) (05/13/2018),  Blepharitis, Carpal tunnel syndrome, bilateral (07/03/2016), Family history of adverse reaction to anesthesia, Hyperlipidemia, Irregular heart rhythm, Macular degeneration (2012), Osteoporosis, Sensorineural hearing loss, bilateral (2007), Shingles (2004), Skin cancer (2005), Torn meniscus (2006), Tremor (2006), Unspecified essential hypertension, Unspecified hypothyroidism, and Urinary frequency. here with:  1.  Essential tremor  The tremor to both hands, worsens during times of stress and anxiety, but seems to have remained overall stable.  She is currently taking Zoloft for anxiety, metoprolol for A. Fib (can benefit tremor).  She was previously on alprazolam, but was discontinued following her hospitalization last year.  We had a good conversation about restarting alprazolam as needed for tremor, we decided to hold off, given history of falls, difficulty with balance, and the fact that she is on Eliquis.  However, we did decide if over time she feels it is necessary, we can revisit this subject.  She will follow-up in 1 year or sooner if needed.  I spent 20 minutes of face-to-face and non-face-to-face time with patient.  This included previsit chart review, lab review, study review, order entry, electronic health record documentation, patient education.  [name], AGNP-C, DNP 08/04/2019, 11:05 AM [facility] [address] [place], [place] [address] [phone]

## 2019-08-04 NOTE — Patient Instructions (Signed)
Continue to follow-up with Dr. Mariea Clonts  We will hold off on the Xanax, for now  Remain of Zoloft, Metoprolol  See you back in 1 year or sooner if needed

## 2019-08-22 IMAGING — CT CT RENAL STONE PROTOCOL
2 of 4 series · 16 of 46 positions shown, 18 images · non-contrast
Comparison: None.

CLINICAL DATA: Left flank pain today.

EXAM:
CT ABDOMEN AND PELVIS WITHOUT CONTRAST
TECHNIQUE: Multidetector CT imaging of the abdomen and pelvis was performed
following the standard protocol without IV contrast.

[Series 2: axial st · axial · 0.73mm/px · z∈[+374,+784]mm · 13 of 90 slices shown, 15 images]
[im 4/90  soft-tissue]
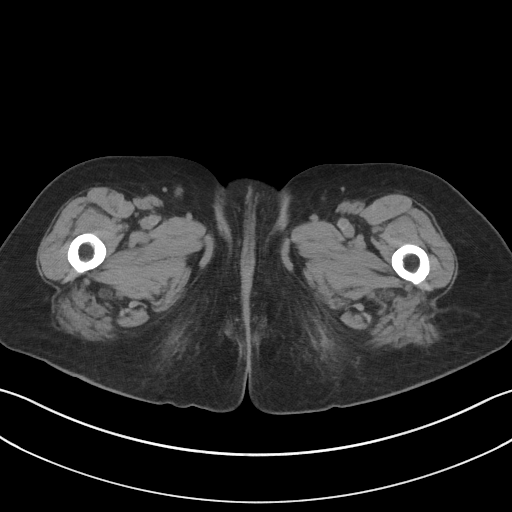
[im 4/90  bone]
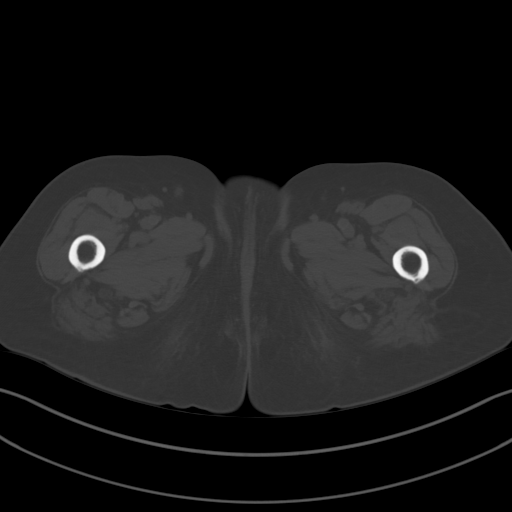
[im 11/90  soft-tissue]
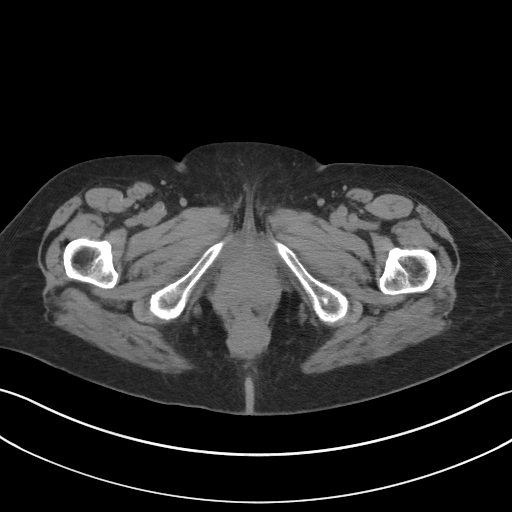
[im 18/90  soft-tissue]
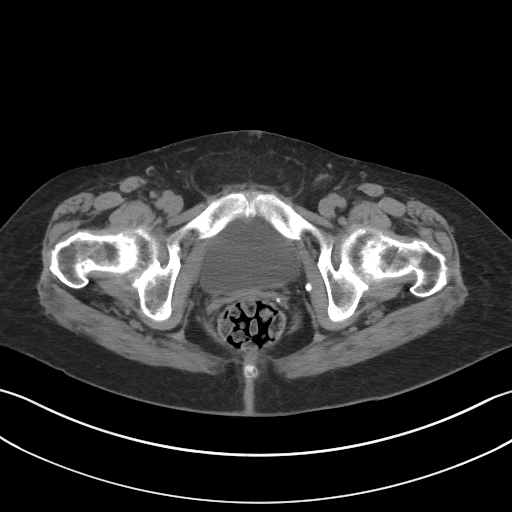
[im 24/90  soft-tissue]
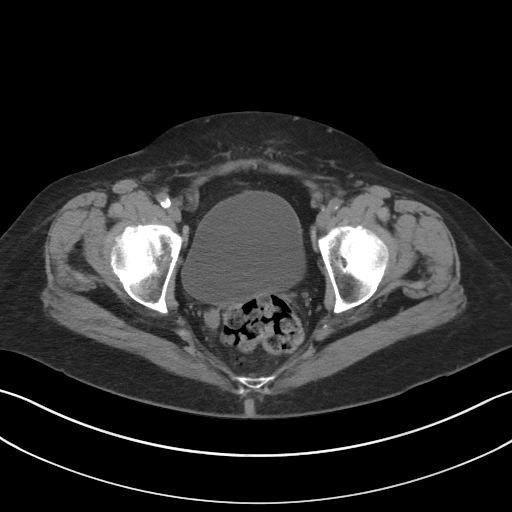
[im 31/90  soft-tissue]
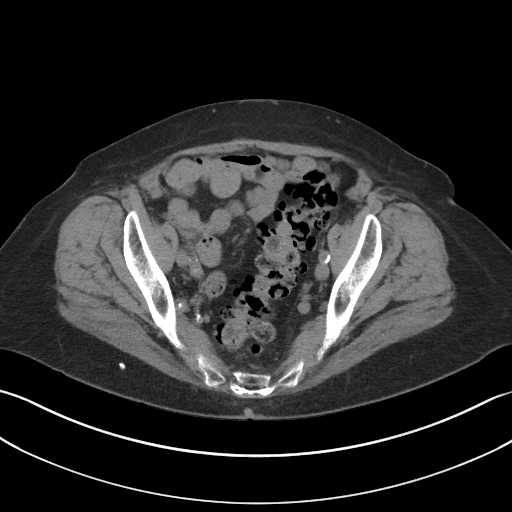
[im 38/90  soft-tissue]
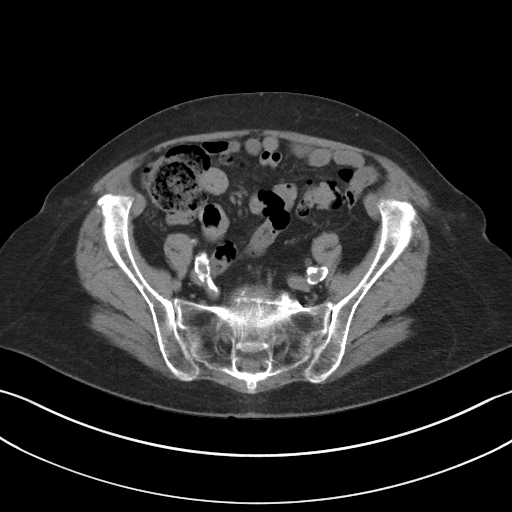
[im 45/90  soft-tissue]
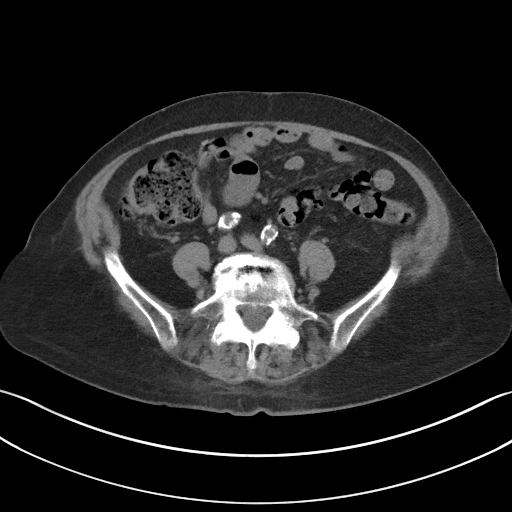
[im 52/90  soft-tissue]
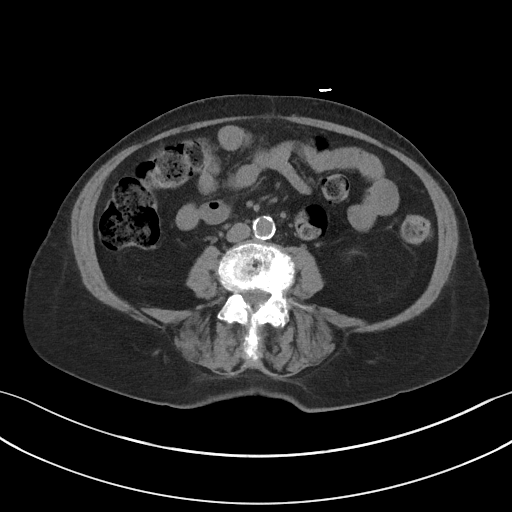
[im 59/90  soft-tissue]
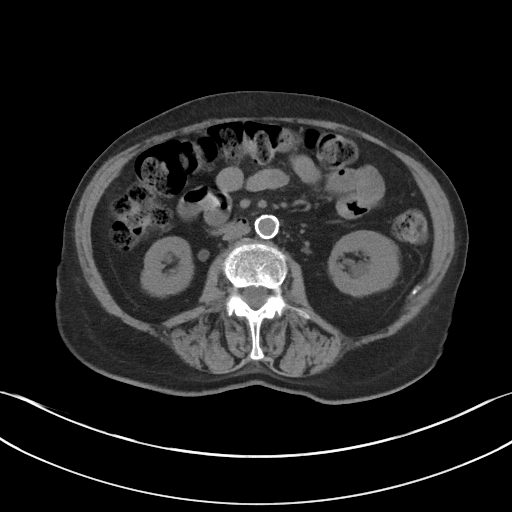
[im 59/90  bone]
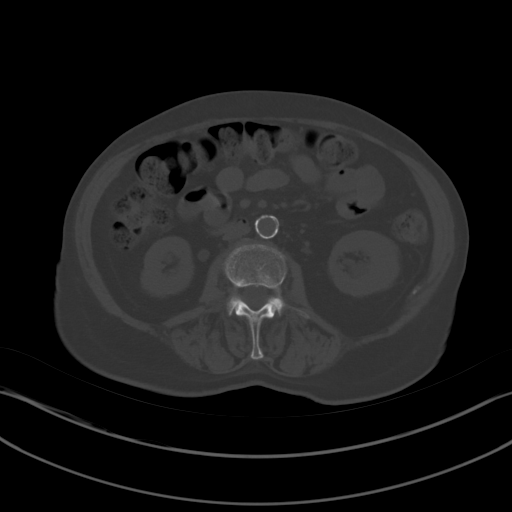
[im 66/90  soft-tissue]
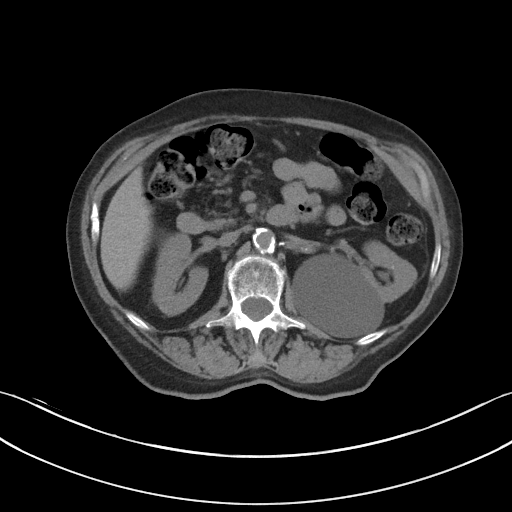
[im 72/90  soft-tissue]
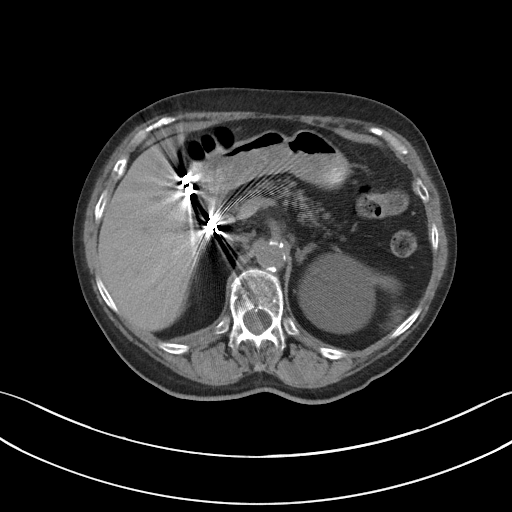
[im 79/90  soft-tissue]
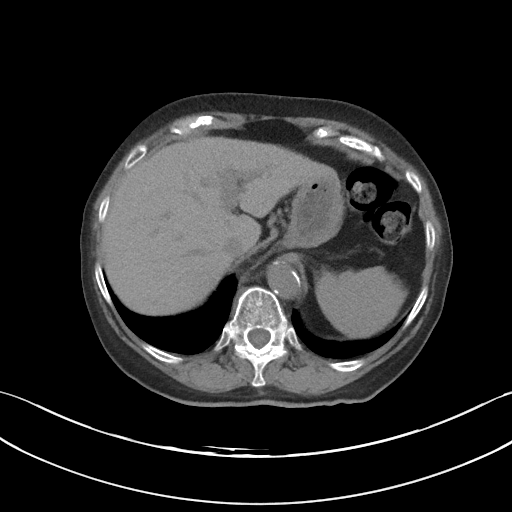
[im 86/90  soft-tissue]
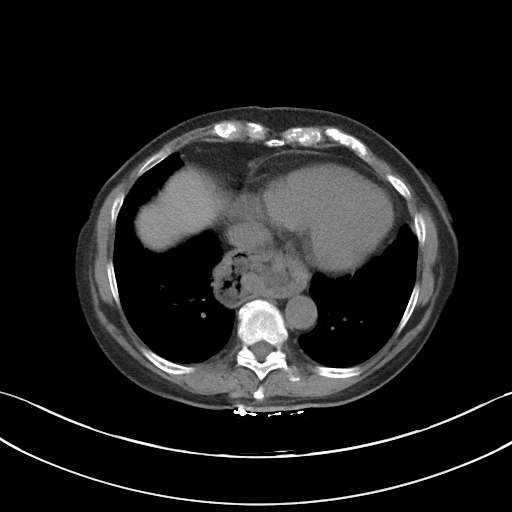

[Series 5: coronal st · coronal · 0.77mm/px · 3 of 78 slices shown]
[im 26/78  soft-tissue]
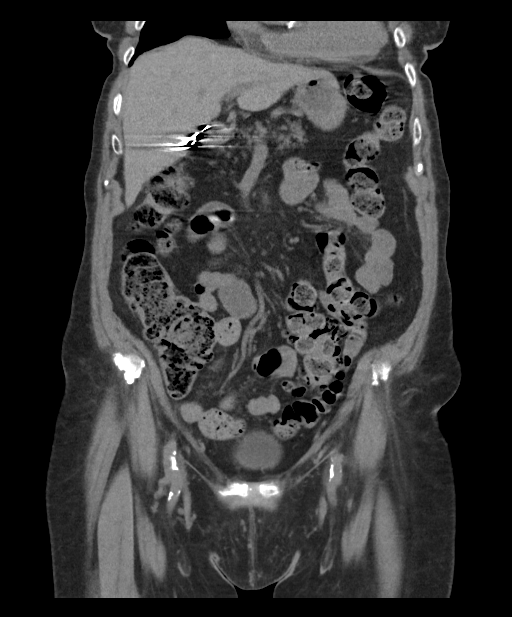
[im 35/78  soft-tissue]
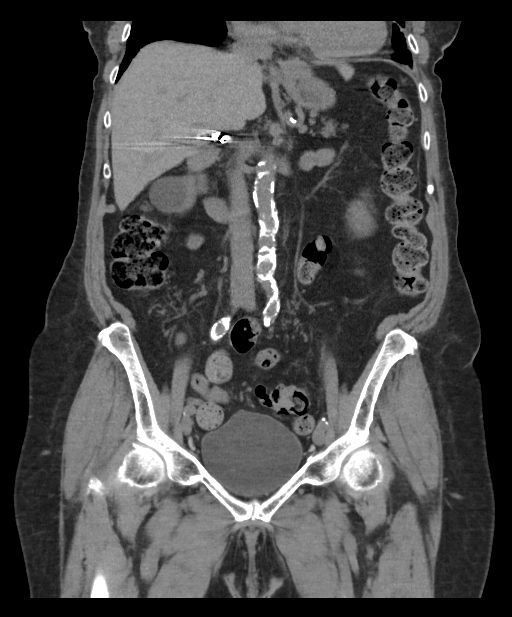
[im 43/78  soft-tissue]
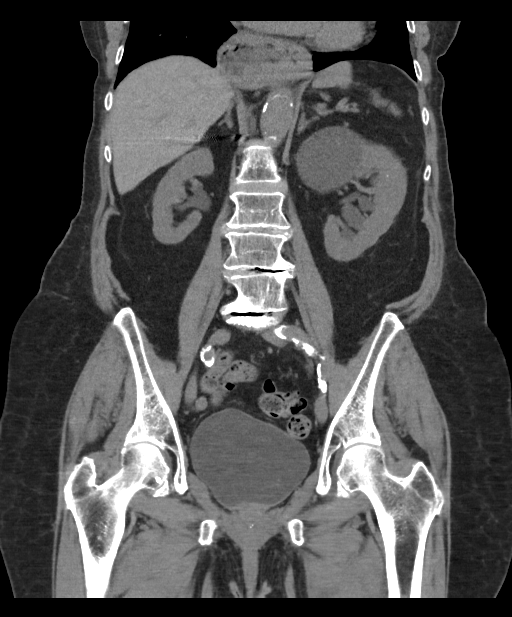

[16 of 46 positions shown; findings below may reference images not displayed]

FINDINGS: Lower chest: Heart size is mildly enlarged. Calcific coronary artery
disease noted. No pleural or pericardial effusion. Lung bases clear.

Hepatobiliary: No focal liver abnormality is seen. Status post
cholecystectomy. No biliary dilatation.

Pancreas: Unremarkable. No pancreatic ductal dilatation or
surrounding inflammatory changes.

Spleen: Normal in size without focal abnormality.

Adrenals/Urinary Tract: Bilateral renal cysts are identified. The
largest cyst is off the upper pole of the left kidney and measures
approximately 7.5 cm in diameter. There are also some parapelvic
left renal cysts. No renal or ureteral stones. No hydronephrosis.
Urinary bladder is unremarkable.

Stomach/Bowel: Diverticulosis without diverticulitis is most notable
in the sigmoid. The colon is otherwise unremarkable. Small bowel and
appendix appear normal.

Vascular/Lymphatic: Aortic atherosclerosis. No enlarged abdominal or
pelvic lymph nodes.

Reproductive: Status post hysterectomy. No adnexal masses.

Other: None.

Musculoskeletal: No acute or focal bony abnormality. Degenerative
disease about the hips and lumbar spondylosis noted.
IMPRESSION: No acute abnormality abdomen or pelvis. Negative for urinary tract
stone.

Mild cardiomegaly.

Moderate hiatal hernia.

Calcific aortic and coronary atherosclerosis.

Diverticulosis without diverticulitis.

## 2019-10-01 DIAGNOSIS — L570 Actinic keratosis: Secondary | ICD-10-CM | POA: Diagnosis not present

## 2019-10-01 DIAGNOSIS — L814 Other melanin hyperpigmentation: Secondary | ICD-10-CM | POA: Diagnosis not present

## 2019-10-01 DIAGNOSIS — Z85828 Personal history of other malignant neoplasm of skin: Secondary | ICD-10-CM | POA: Diagnosis not present

## 2019-10-01 DIAGNOSIS — L821 Other seborrheic keratosis: Secondary | ICD-10-CM | POA: Diagnosis not present

## 2019-10-06 ENCOUNTER — Ambulatory Visit (INDEPENDENT_AMBULATORY_CARE_PROVIDER_SITE_OTHER): Payer: Medicare Other | Admitting: Internal Medicine

## 2019-10-06 ENCOUNTER — Other Ambulatory Visit: Payer: Self-pay

## 2019-10-06 ENCOUNTER — Encounter: Payer: Self-pay | Admitting: Internal Medicine

## 2019-10-06 VITALS — BP 158/90 | HR 61 | Temp 96.8°F | Ht 60.0 in | Wt 132.0 lb

## 2019-10-06 DIAGNOSIS — R55 Syncope and collapse: Secondary | ICD-10-CM | POA: Diagnosis not present

## 2019-10-06 DIAGNOSIS — Z8673 Personal history of transient ischemic attack (TIA), and cerebral infarction without residual deficits: Secondary | ICD-10-CM

## 2019-10-06 DIAGNOSIS — I480 Paroxysmal atrial fibrillation: Secondary | ICD-10-CM

## 2019-10-06 DIAGNOSIS — E7800 Pure hypercholesterolemia, unspecified: Secondary | ICD-10-CM

## 2019-10-06 DIAGNOSIS — I10 Essential (primary) hypertension: Secondary | ICD-10-CM

## 2019-10-06 NOTE — Patient Instructions (Signed)
Medication Instructions:  Your Physician recommend you continue on your current medication as directed.   *If you need a refill on your cardiac medications before your next appointment, please call your pharmacy*   Lab Work: None    Testing/Procedures: None   Follow-Up: At [facility], you and your health needs are our priority.  As part of our continuing mission to provide you with exceptional heart care, we have created designated Provider Care Teams.  These Care Teams include your primary Cardiologist (physician) and Advanced Practice Providers (APPs -  Physician Assistants and Nurse Practitioners) who all work together to provide you with the care you need, when you need it.  We recommend signing up for the patient portal called "MyChart".  Sign up information is provided on this After Visit Summary.  MyChart is used to connect with patients for Virtual Visits (Telemedicine).  Patients are able to view lab/test results, encounter notes, upcoming appointments, etc.  Non-urgent messages can be sent to your provider as well.   To learn more about what you can do with MyChart, go to [email or web address].    Your next appointment:   6 month(s)  The format for your next appointment:   In Person  Provider:   Gayatri Acharya, MD     

## 2019-10-06 NOTE — Progress Notes (Signed)
Cardiology Office Note:    Date:  10/06/2019   ID:  Christina Lawson, DOB 29-Apr-1923, MRN [id number]  PCP:  Gayland Curry, DO  Cardiologist:  Elouise Munroe, MD  Electrophysiologist:  None   Referring MD: Gayland Curry, DO   Chief Complaint: cardiovascular follow up  History of Present Illness:    Christina Lawson is a 86 y.o. female with a history of hypertension, hypothyroidism, tremor, hyperlipidemia, prior TIA, and prior syncopal episode.  Since our initial encounter, she has had no recurrent episodes of syncope.  The pandemic has been particularly difficult for her since she used to swim at her facility's pool, and can no longer do so.  In fact she mentions she is not interested in swimming anymore.  She does try to exercise both program on TV, and finds this a stress relieving activity.  She has significant life stressors at the moment.  Her brother-in-law who is 62 years old and is a Holocaust survivor is currently ill, and she does not feel she is in good enough shape to visit him in [place].  She is also concerned about exposure to the virus.  This brings her quite a bit of stress in her life, but she is trying to manage it.  She is on apixaban 2.5 mg twice daily (dose reduced for age and weight) for history of atrial fibrillation.  A few weeks ago she had several days of prolonged epistaxis.  This morning she has what sounds like hemorrhoidal bleeding.  This was an isolated episode of rectal bleeding.  I have asked her to monitor this episode.  We had a long discussion about the risks and benefits of continuing anticoagulation.  Discussed she has determined that she would like to prevent the risk of stroke.  We have discussed episodes of bleeding do not represent life-threatening or major bleeding, and can likely be observed.  I have offered her blood work today to check CBC, she defers.  She inquires about medications for her tremors which have stopped her from eating  foods like soup and cereal.  Her heart rate is 61 on ECG today.  We discussed the role of beta-blockade in reducing essential tremor, and she is already on metoprolol 12.5 mg daily.  I have informed her that this may not be an optimal strategy given her already low heart rate.  She inquires whether benzodiazepines would help.  Have counseled her on the risk of benzodiazepine use at her age.  She can inquire further with her primary care doctor Dr. Mariea Clonts.   Past Medical History:  Diagnosis Date  . Allergic rhinitis    pollen and grass  . Asthma   . Atrial fibrillation ([place]) 05/13/2018  . Blepharitis   . Carpal tunnel syndrome, bilateral 07/03/2016  . Family history of adverse reaction to anesthesia    " [place]   . Hyperlipidemia   . Irregular heart rhythm   . Macular degeneration 2012  . Osteoporosis   . Sensorineural hearing loss, bilateral 2007  . Shingles 2004  . Skin cancer 2005   nose  . Torn meniscus 2006   right knee  . Tremor 2006   familiar.  dx by Dr. Quillian Quince  . Unspecified essential hypertension   . Unspecified hypothyroidism   . Urinary frequency     Past Surgical History:  Procedure Laterality Date  . ABDOMINAL HYSTERECTOMY  1977  . CATARACT EXTRACTION  1974  Left, IOL implants  . CATARACT EXTRACTION Right 1979   IOL implants  . CHOLECYSTECTOMY  1981  . COSMETIC SURGERY  1995   eye  . [place]  . DILATION AND CURETTAGE OF UTERUS  1977  . [place]  . HIATAL HERNIA REPAIR  1977  . SKIN CANCER EXCISION  2005   nose  . THYROIDECTOMY  1955  . TONSILLECTOMY  1931    Current Medications: Current Meds  Medication Sig  . albuterol (PROVENTIL HFA;VENTOLIN HFA) 108 (90 Base) MCG/ACT inhaler Inhale 1 puff into the lungs every 6 (six) hours as needed for wheezing.   Marland Kitchen apixaban (ELIQUIS) 2.5 MG TABS tablet Take 1 tablet (2.5 mg total) by mouth 2 (two) times daily.  . Azelastine HCl  0.15 % SOLN USE 1 SPRAY NASALLY TWICE A DAY  . calcium carbonate (TUMS - DOSED IN MG ELEMENTAL CALCIUM) 500 MG chewable tablet Chew 1 tablet by mouth daily as needed for indigestion.  . Cholecalciferol (VITAMIN D) 2000 units CAPS Take 1 capsule (2,000 Units total) by mouth daily.  . Diclofenac Sodium 1 % CREA Place 1 application onto the skin as needed.  . docusate sodium (COLACE) 100 MG capsule Take 100 mg by mouth daily.    . fluticasone furoate-vilanterol (BREO ELLIPTA) 200-25 MCG/INH AEPB Inhale 1 puff into the lungs daily.  Marland Kitchen HYDROcodone-acetaminophen (NORCO/VICODIN) 5-325 MG tablet Take 1 tablet by mouth daily as needed for severe pain (in back).  . loratadine (CLARITIN) 10 MG tablet Take 1 tablet (10 mg total) by mouth daily.  . metoprolol succinate (TOPROL-XL) 25 MG 24 hr tablet Take 0.5 tablets (12.5 mg total) by mouth daily.  . Multiple Vitamins-Minerals (PRESERVISION AREDS 2+MULTI VIT PO) Take by mouth.  . Multiple Vitamins-Minerals (PRESERVISION AREDS PO) Take 1 tablet by mouth 2 (two) times daily.  Marland Kitchen Propylene Glycol (SYSTANE COMPLETE OP) Apply to eye.  . Saline (SIMPLY SALINE) 0.9 % AERS Place 1 spray into the nose daily.   . sertraline (ZOLOFT) 50 MG tablet Take 1 tablet (50 mg total) by mouth daily.     Allergies:   Clindamycin/lincomycin, Ibuprofen, and Penicillins   Social History   Socioeconomic History  . Marital status: Widowed    Spouse name: Not on file  . Number of children: 2  . Years of education: college  . Highest education level: Not on file  Occupational History  . Occupation: retired Education officer, museum    Comment: retired  Tobacco Use  . Smoking status: Never Smoker  . Smokeless tobacco: Never Used  Vaping Use  . Vaping Use: Never used  Substance and Sexual Activity  . Alcohol use: Yes    Comment: on occ.  . Drug use: No  . Sexual activity: Not Currently  Other Topics Concern  . Not on file  Social History Narrative   Lives at [place]. Married 1958.   Never smoked   Alcohol minimal   Exercise predominantly walking, water aerobic   Retired Education officer, museum      Social Determinants of Radio broadcast assistant Strain:   . Difficulty of Paying Living Expenses:   Food Insecurity:   . Worried About Charity fundraiser in the Last Year:   . Arboriculturist in the Last Year:   Transportation Needs:   . Film/video editor (Medical):   Marland Kitchen Lack of Transportation (Non-Medical):   Physical Activity:   . Days of Exercise  per Week:   . Minutes of Exercise per Session:   Stress:   . Feeling of Stress :   Social Connections:   . Frequency of Communication with Friends and Family:   . Frequency of Social Gatherings with Friends and Family:   . Attends Religious Services:   . Active Member of Clubs or Organizations:   . Attends Archivist Meetings:   Marland Kitchen Marital Status:      Family History: The patient's family history includes Allergies in her father; Asthma in her father; Brain cancer in her mother; Breast cancer in her cousin, cousin, maternal aunt, maternal aunt, paternal aunt, and sister; Breast cancer (age of onset: 31) in her sister; Breast cancer (age of onset: 69) in her daughter; Cancer in her mother, sister, and sister; Emphysema in her father; Endometrial cancer in her sister; Heart disease in her maternal grandfather; Hypertension in her mother; Stroke in an other family member.  ROS:   Please see the history of present illness.    All other systems reviewed and are negative.  EKGs/Labs/Other Studies Reviewed:    The following studies were reviewed today:  EKG: Normal sinus rhythm, left axis deviation  Recent Labs: 07/18/2019: ALT 13; BUN 17; Creatinine 0.7; Hemoglobin 12.5; Platelets 191; Potassium 4.7; Sodium 141  Recent Lipid Panel    Component Value Date/Time   CHOL 211 (H) 05/14/2018 0219   TRIG 61 05/14/2018 0219   HDL 53 05/14/2018 0219   CHOLHDL 4.0 05/14/2018 0219   VLDL 12  05/14/2018 0219   LDLCALC 146 (H) 05/14/2018 0219    Physical Exam:    VS:  BP (!) 158/90   Pulse 61   Temp (!) 96.8 F (36 C)   Ht 5' (1.524 m)   Wt 132 lb (59.9 kg)   SpO2 96%   BMI 25.78 kg/m     Wt Readings from Last 5 Encounters:  10/06/19 132 lb (59.9 kg)  08/04/19 132 lb (59.9 kg)  07/30/19 134 lb 9.6 oz (61.1 kg)  05/20/19 134 lb (60.8 kg)  05/13/19 134 lb (60.8 kg)     Constitutional: No acute distress Eyes: sclera non-icteric, normal conjunctiva and lids ENMT: normal dentition, moist mucous membranes Cardiovascular: regular rhythm, normal rate, no murmurs. S1 and S2 normal. Radial pulses normal bilaterally. No jugular venous distention.  Respiratory: clear to auscultation bilaterally GI : normal bowel sounds, soft and nontender. No distention.   MSK: extremities warm, well perfused. No edema.  NEURO: grossly nonfocal exam, moves all extremities. PSYCH: alert and oriented x 3, normal mood and affect.   ASSESSMENT:    1. Paroxysmal atrial fibrillation (HCC)   2. Essential hypertension   3. Syncope, unspecified syncope type   4. Pure hypercholesterolemia   5. H/O TIA (transient ischemic attack) and stroke    PLAN:    Paroxysmal atrial fibrillation ([place]) - Plan: EKG 12-Lead -Continue apixaban 2.5 mg twice daily after shared decision making regarding epistaxis and rectal bleeding.  The patient would like to continue to reduce her risk of stroke.  Essential hypertension-continue metoprolol 12.5 mg daily  Syncope, unspecified syncope type-echocardiogram last year showed no evidence of a structural cause of syncope.  Pure hypercholesterolemia -not currently on lipid-lowering therapy.  Consider recheck with PCP and therapy if indicated.  H/O TIA (transient ischemic attack) and stroke-no recurrence since last follow-up.    Total time of encounter: 30 minutes total time of encounter, including 20 minutes spent in face-to-face patient care on the date  of this  encounter. This time includes coordination of care and counseling regarding above mentioned problem list. Remainder of non-face-to-face time involved reviewing chart documents/testing relevant to the patient encounter and documentation in the medical record. I have independently reviewed documentation from referring provider.   Cherlynn Kaiser, MD [place]  [facility]    Medication Adjustments/Labs and Tests Ordered: Current medicines are reviewed at length with the patient today.  Concerns regarding medicines are outlined above.  Orders Placed This Encounter  Procedures  . EKG 12-Lead   No orders of the defined types were placed in this encounter.   Patient Instructions  Medication Instructions:  Your Physician recommend you continue on your current medication as directed.    *If you need a refill on your cardiac medications before your next appointment, please call your pharmacy*   Lab Work: None   Testing/Procedures: None   Follow-Up: At [facility], you and your health needs are our priority.  As part of our continuing mission to provide you with exceptional heart care, we have created designated Provider Care Teams.  These Care Teams include your primary Cardiologist (physician) and Advanced Practice Providers (APPs -  Physician Assistants and Nurse Practitioners) who all work together to provide you with the care you need, when you need it.  We recommend signing up for the patient portal called "MyChart".  Sign up information is provided on this After Visit Summary.  MyChart is used to connect with patients for Virtual Visits (Telemedicine).  Patients are able to view lab/test results, encounter notes, upcoming appointments, etc.  Non-urgent messages can be sent to your provider as well.   To learn more about what you can do with MyChart, go to [email or web address].    Your next appointment:   6 month(s)  The format for your next appointment:   In  Person  Provider:   Cherlynn Kaiser, MD

## 2019-10-23 ENCOUNTER — Ambulatory Visit (INDEPENDENT_AMBULATORY_CARE_PROVIDER_SITE_OTHER): Payer: Medicare Other

## 2019-10-23 ENCOUNTER — Ambulatory Visit (INDEPENDENT_AMBULATORY_CARE_PROVIDER_SITE_OTHER): Payer: Medicare Other | Admitting: Orthopaedic Surgery

## 2019-10-23 ENCOUNTER — Encounter: Payer: Self-pay | Admitting: Orthopaedic Surgery

## 2019-10-23 DIAGNOSIS — M25562 Pain in left knee: Secondary | ICD-10-CM

## 2019-10-23 NOTE — Progress Notes (Signed)
Office Visit Note   Patient: Christina Lawson           Date of Birth: 1923-05-30           MRN: [id number] Visit Date: 10/23/2019              Requested by: Gayland Curry, DO [place],  [place] [address] PCP: Gayland Curry, DO   Assessment & Plan: Visit Diagnoses:  1. Left knee pain, unspecified chronicity     Plan: Impression is left knee pain likely due to osteoarthritis.  Overall the pain has significantly improved.  She will continue to use Voltaren gel.  I have recommended over-the-counter compression knee sleeve during activity.  Questions encouraged and answered.  Follow-up as needed.  Follow-Up Instructions: Return if symptoms worsen or fail to improve.   Orders:  Orders Placed This Encounter  Procedures   XR KNEE 3 VIEW LEFT   No orders of the defined types were placed in this encounter.     Procedures: No procedures performed   Clinical Data: No additional findings.   Subjective: Chief Complaint  Patient presents with   Left Knee - Pain    Christina Lawson is a 86 year old female resident at [facility] who comes in for left knee pain a couple weeks ago when she was sleeping.  It woke her up from her sleep.  She uses Voltaren gel for pain which does help.  She has a history of torn meniscus 13 years ago that did not require surgery.  Denies any mechanical symptoms.  The pain is localized anteriorly.   Review of Systems  Constitutional: Negative.   HENT: Negative.   Eyes: Negative.   Respiratory: Negative.   Cardiovascular: Negative.   Endocrine: Negative.   Musculoskeletal: Negative.   Neurological: Negative.   Hematological: Negative.   Psychiatric/Behavioral: Negative.   All other systems reviewed and are negative.    Objective: Vital Signs: There were no vitals taken for this visit.  Physical Exam Vitals and nursing note reviewed.  Constitutional:      Appearance: She is well-developed.  HENT:     Head: Normocephalic and  atraumatic.  Pulmonary:     Effort: Pulmonary effort is normal.  Abdominal:     Palpations: Abdomen is soft.  Musculoskeletal:     Cervical back: Neck supple.  Skin:    General: Skin is warm.     Capillary Refill: Capillary refill takes less than 2 seconds.  Neurological:     Mental Status: She is alert and oriented to person, place, and time.  Psychiatric:        Behavior: Behavior normal.        Thought Content: Thought content normal.        Judgment: Judgment normal.     Ortho Exam Left knee shows no joint effusion.  No joint line tenderness.  Collaterals and cruciates are stable.  Well-preserved range of motion with minimal crepitus. Specialty Comments:  No specialty comments available.  Imaging: XR KNEE 3 VIEW LEFT  Result Date: 10/23/2019 Mild osteoarthritis appropriate for age    71 History: Patient Active Problem List   Diagnosis Date Noted   Mixed incontinence urge and stress 07/30/2019   Psychophysiological insomnia 03/26/2019   Paroxysmal atrial fibrillation ([place]) 08/21/2018   NSTEMI (non-ST elevated myocardial infarction) ([place]) 05/15/2018   Chest pain    Atrial fibrillation with RVR ([place]) 05/13/2018   Acquired hypothyroidism 05/13/2018   Left thigh pain 10/10/2017   Anxiety  10/10/2017   H/O TIA (transient ischemic attack) and stroke 10/10/2017   Moderate persistent asthma without complication 13/10/6576   Parkinsonism ([place]) 04/11/2017   Mild persistent asthma without complication 46/96/2952   Seasonal allergic rhinitis 04/05/2017   Carpal tunnel syndrome, bilateral 07/03/2016   Numbness and tingling in both hands 06/05/2016   Drooling 04/12/2016   Cornea guttata 06/03/2015   Posterior vitreous detachment of both eyes 06/03/2015   Hyperglycemia 03/24/2015   Senile osteopenia 03/24/2015   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   Murmur, cardiac 03/24/2015   Conjunctivitis 01/12/2014   Edema 08/20/2013   Senile ecchymosis  06/23/2013   Rash and nonspecific skin eruption 03/26/2013   Pain in left knee 01/06/2013   Osteopenia 12/16/2012   Urinary frequency 09/23/2012   Impacted cerumen 09/23/2012   Essential hypertension    Hyperlipidemia    Tremor, essential    Nonexudative age-related macular degeneration 05/23/2012   Status post intraocular lens implant 05/23/2012   Cardiomegaly - hypertensive 08/10/2010   Asthma, intrinsic 05/31/2010   Dyspnea 05/31/2010   Past Medical History:  Diagnosis Date   Allergic rhinitis    pollen and grass   Asthma    Atrial fibrillation ([place]) 05/13/2018   Blepharitis    Carpal tunnel syndrome, bilateral 07/03/2016   Family history of adverse reaction to anesthesia    " [place] (86YR OLD ) DIED UNDER ANESTHESTIA    Hyperlipidemia    Irregular heart rhythm    Macular degeneration 2012   Osteoporosis    Sensorineural hearing loss, bilateral 2007   Shingles 2004   Skin cancer 2005   nose   Torn meniscus 2006   right knee   Tremor 2006   familiar.  dx by Dr. Quillian Quince   Unspecified essential hypertension    Unspecified hypothyroidism    Urinary frequency     Family History  Problem Relation Age of Onset   Emphysema Father    Allergies Father    Asthma Father    Brain cancer Mother    Cancer Mother        Brain   Hypertension Mother    Cancer Sister        Cervical   Endometrial cancer Sister    Breast cancer Sister 5   Cancer Sister        Breast, metastasized to bone   Breast cancer Sister        unsuer of age   Breast cancer Daughter 64   Heart disease Maternal Grandfather    Stroke Other    Breast cancer Maternal Aunt    Breast cancer Paternal Aunt    Breast cancer Cousin    Breast cancer Maternal Aunt    Breast cancer Cousin     Past Surgical History:  Procedure Laterality Date   ABDOMINAL HYSTERECTOMY  1977   CATARACT EXTRACTION  1974   Left, IOL implants   CATARACT  EXTRACTION Right 1979   IOL implants   [place]   eye   [place]   SKIN CANCER EXCISION  2005   nose   THYROIDECTOMY  1955   TONSILLECTOMY  1931   Social History   Occupational History   Occupation: retired Education officer, museum    Comment: retired  Tobacco Use   Smoking status: Never Smoker  Smokeless tobacco: Never Used  Vaping Use   Vaping Use: Never used  Substance and Sexual Activity   Alcohol use: Yes    Comment: on occ.   Drug use: No   Sexual activity: Not Currently

## 2019-10-27 DIAGNOSIS — Z20828 Contact with and (suspected) exposure to other viral communicable diseases: Secondary | ICD-10-CM | POA: Diagnosis not present

## 2019-10-27 DIAGNOSIS — Z9189 Other specified personal risk factors, not elsewhere classified: Secondary | ICD-10-CM | POA: Diagnosis not present

## 2019-11-03 DIAGNOSIS — Z20828 Contact with and (suspected) exposure to other viral communicable diseases: Secondary | ICD-10-CM | POA: Diagnosis not present

## 2019-11-03 DIAGNOSIS — Z9189 Other specified personal risk factors, not elsewhere classified: Secondary | ICD-10-CM | POA: Diagnosis not present

## 2019-11-18 ENCOUNTER — Ambulatory Visit (INDEPENDENT_AMBULATORY_CARE_PROVIDER_SITE_OTHER): Payer: Medicare Other | Admitting: Allergy & Immunology

## 2019-11-18 ENCOUNTER — Encounter: Payer: Self-pay | Admitting: Allergy & Immunology

## 2019-11-18 ENCOUNTER — Other Ambulatory Visit: Payer: Self-pay

## 2019-11-18 VITALS — BP 136/66 | HR 63 | Temp 98.2°F | Resp 18

## 2019-11-18 DIAGNOSIS — J302 Other seasonal allergic rhinitis: Secondary | ICD-10-CM

## 2019-11-18 DIAGNOSIS — J4540 Moderate persistent asthma, uncomplicated: Secondary | ICD-10-CM

## 2019-11-18 NOTE — Progress Notes (Signed)
FOLLOW UP  Date of Service/Encounter:  11/18/19   Assessment:   Moderate persistent asthma without complication   Seasonal allergic rhinitis  TIA(summer 2019)   Christina Lawson is doing very well her with current regimen. We are not going to make any medication changes since she is so stable. She is having to alter a lot of her physical expectations, as moving to a less independent part of the retirement community. However, despite this, she is very optimistic today and very sharp, as always. We will send in refills and see her again in six months.   Plan/Recommendations:   1. Mild persistent asthma, uncomplicated - Lung testing deferred today since you were doing so well.  - Continue with Breo 100/25 one puff once daily.  - Continue with albuterol 2-4 puffs every 4-6 hours as needed.   2. Allergic rhinoconjunctivitis - Continue with fluticasone and azelastine: do one spray per nostril twice daily - Continue with Claritin (lortadine) as needed.   3. Return in about 6 months (around 05/17/2020). This can be an in-person, a virtual Webex or a telephone follow up visit.   Subjective:   Christina Lawson is a 86 y.o. female presenting today for follow up of  Chief Complaint  Patient presents with  . Follow-up  . Cough    Christina Lawson has a history of the following: Patient Active Problem List   Diagnosis Date Noted  . Mixed incontinence urge and stress 07/30/2019  . Psychophysiological insomnia 03/26/2019  . Paroxysmal atrial fibrillation ([place]) 08/21/2018  . NSTEMI (non-ST elevated myocardial infarction) ([place]) 05/15/2018  . Chest pain   . Atrial fibrillation with RVR ([place]) 05/13/2018  . Acquired hypothyroidism 05/13/2018  . Left thigh pain 10/10/2017  . Anxiety 10/10/2017  . H/O TIA (transient ischemic attack) and stroke 10/10/2017  . Moderate persistent asthma without complication 63/03/6008  . Parkinsonism ([place]) 04/11/2017  . Mild persistent asthma without  complication 93/23/5573  . Seasonal allergic rhinitis 04/05/2017  . Carpal tunnel syndrome, bilateral 07/03/2016  . Numbness and tingling in both hands 06/05/2016  . Drooling 04/12/2016  . Cornea guttata 06/03/2015  . Posterior vitreous detachment of both eyes 06/03/2015  . Hyperglycemia 03/24/2015  . Senile osteopenia 03/24/2015  . 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 Murmur, cardiac 03/24/2015  . Conjunctivitis 01/12/2014  . Edema 08/20/2013  . Senile ecchymosis 06/23/2013  . Rash and nonspecific skin eruption 03/26/2013  . Pain in left knee 01/06/2013  . Osteopenia 12/16/2012  . Urinary frequency 09/23/2012  . Impacted cerumen 09/23/2012  . Essential hypertension   . Hyperlipidemia   . Tremor, essential   . Nonexudative age-related macular degeneration 05/23/2012  . Status post intraocular lens implant 05/23/2012  . Cardiomegaly - hypertensive 08/10/2010  . Asthma, intrinsic 05/31/2010  . Dyspnea 05/31/2010    History obtained from: chart review and patient.  Christina Lawson is a 86 y.o. female presenting for a follow up visit.  She was last seen in March 2021.  At that time, we continued with Breo 1 puff once daily as well as albuterol 2 to 4 puffs every 4-6 hours as needed.  For her allergic rhinoconjunctivitis, we will continue with Flonase and Astelin as well as Claritin.  Since last visit, she has done fairly well.  She has moved to a less independent part of the nursing home and seems to be liking it fairly well.  It is farther away from her swelling fall, says she has not been swimming in quite some time.  She  does miss water aerobics.  She does exercise in her room.  Christina Lawson's asthma has been well controlled. She has not required rescue medication, experienced nocturnal awakenings due to lower respiratory symptoms, nor have activities of daily living been limited. She has required no Emergency Department or Urgent Care visits for her asthma. She has required zero courses of  systemic steroids for asthma exacerbations since the last visit. ACT score today is 24, indicating excellent asthma symptom control. She is still on the [place]. She was able to get it for $30 instead of the $300 that she has pain before. All of her meds go through a pharmacy in her retirement community and she tells me that they're very good about finding discount codes and other strategies to keep their medications affordable.  Allergic rhinitis is well controlled with her nasal sprays and Claritin. She uses 1 spray of each of her nasal sprays once a day. Claritin is more of a as needed medication. She has not needed antibiotics at all since last visit.  Otherwise, there have been no changes to her past medical history, surgical history, family history, or social history.    Review of Systems  Constitutional: Negative.  Negative for chills, fever, malaise/fatigue and weight loss.  HENT: Negative for congestion, ear discharge, ear pain and sinus pain.   Eyes: Negative for pain, discharge and redness.  Respiratory: Negative for cough, sputum production, shortness of breath and wheezing.   Cardiovascular: Negative.  Negative for chest pain and palpitations.  Gastrointestinal: Negative for abdominal pain, constipation, diarrhea, heartburn, nausea and vomiting.  Skin: Negative.  Negative for itching and rash.  Neurological: Negative for dizziness and headaches.  Endo/Heme/Allergies: Negative for environmental allergies. Does not bruise/bleed easily.       Objective:   Blood pressure 136/66, pulse 63, temperature 98.2 F (36.8 C), temperature source Temporal, resp. rate 18, SpO2 97 %. There is no height or weight on file to calculate BMI.   Physical Exam:  Physical Exam Constitutional:      Appearance: She is well-developed.     Comments: Very pleasant female. Cooperative with the exam. Talkative and adorable.  HENT:     Head: Normocephalic and atraumatic.     Right Ear: Tympanic  membrane, ear canal and external ear normal.     Left Ear: Tympanic membrane, ear canal and external ear normal.     Nose: No nasal deformity, septal deviation, mucosal edema or rhinorrhea.     Right Turbinates: Enlarged and swollen.     Left Turbinates: Enlarged and swollen.     Right Sinus: No maxillary sinus tenderness or frontal sinus tenderness.     Left Sinus: No maxillary sinus tenderness or frontal sinus tenderness.     Mouth/Throat:     Mouth: Mucous membranes are not pale and not dry.     Pharynx: Uvula midline.  Eyes:     General:        Right eye: No discharge.        Left eye: No discharge.     Conjunctiva/sclera: Conjunctivae normal.     Right eye: Right conjunctiva is not injected. No chemosis.    Left eye: Left conjunctiva is not injected. No chemosis.    Pupils: Pupils are equal, round, and reactive to light.  Cardiovascular:     Rate and Rhythm: Normal rate and regular rhythm.     Heart sounds: Normal heart sounds.  Pulmonary:     Effort: Pulmonary effort is normal. No tachypnea,  accessory muscle usage or respiratory distress.     Breath sounds: Normal breath sounds. No wheezing, rhonchi or rales.     Comments: Moving air well in all lung fields. No increased work of breathing. Chest:     Chest wall: No tenderness.  Lymphadenopathy:     Cervical: No cervical adenopathy.  Skin:    Coloration: Skin is not pale.     Findings: No abrasion, erythema, petechiae or rash. Rash is not papular, urticarial or vesicular.     Comments: No eczematous or urticarial lesions noted.  Neurological:     Mental Status: She is alert.  Psychiatric:        Behavior: Behavior is cooperative.      Diagnostic studies: none    Salvatore Marvel, MD  [facility]

## 2019-11-18 NOTE — Patient Instructions (Addendum)
1. Mild persistent asthma, uncomplicated - Lung testing deferred today since you were doing so well.  - Continue with Breo 100/25 one puff once daily.  - Continue with albuterol 2-4 puffs every 4-6 hours as needed.   2. Allergic rhinoconjunctivitis - Continue with fluticasone and azelastine: do one spray per nostril twice daily - Continue with Claritin (lortadine) as needed.   2. Return in about 6 months (around 05/17/2020). This can be an in-person, a virtual Webex or a telephone follow up visit.   Please inform us of any Emergency Department visits, hospitalizations, or changes in symptoms. Call us before going to the ED for breathing or allergy symptoms since we might be able to fit you in for a sick visit. Feel free to contact us anytime with any questions, problems, or concerns.  It was a pleasure to see you again today! I LOVE SEEING YOU!!!   Websites that have reliable patient information: 1. American Academy of Asthma, Allergy, and Immunology: www.aaaai.org 2. Food Allergy Research and Education (FARE): foodallergy.org 3. Mothers of Asthmatics: http://www.asthmacommunitynetwork.org 4. American College of Allergy, Asthma, and Immunology: www.acaai.org   COVID-19 Vaccine Information can be found at: ShippingScam.co.uk For questions related to vaccine distribution or appointments, please email [email or web address] or call [phone].     "Like" [place] on Facebook and Instagram for our latest updates!        Make sure you are registered to vote! If you have moved or changed any of your contact information, you will need to get this updated before voting!  In some cases, you MAY be able to register to vote online: [email or web address]

## 2019-11-19 ENCOUNTER — Encounter: Payer: Self-pay | Admitting: Allergy & Immunology

## 2019-12-03 ENCOUNTER — Other Ambulatory Visit: Payer: Self-pay

## 2019-12-03 ENCOUNTER — Encounter: Payer: Self-pay | Admitting: Internal Medicine

## 2019-12-03 ENCOUNTER — Non-Acute Institutional Stay: Payer: Medicare Other | Admitting: Internal Medicine

## 2019-12-03 VITALS — BP 128/82 | HR 62 | Temp 97.5°F | Ht 60.0 in | Wt 133.2 lb

## 2019-12-03 DIAGNOSIS — G20 Parkinson's disease: Secondary | ICD-10-CM | POA: Diagnosis not present

## 2019-12-03 DIAGNOSIS — M7989 Other specified soft tissue disorders: Secondary | ICD-10-CM

## 2019-12-03 DIAGNOSIS — J45909 Unspecified asthma, uncomplicated: Secondary | ICD-10-CM

## 2019-12-03 DIAGNOSIS — I480 Paroxysmal atrial fibrillation: Secondary | ICD-10-CM

## 2019-12-03 DIAGNOSIS — G3184 Mild cognitive impairment, so stated: Secondary | ICD-10-CM

## 2019-12-03 DIAGNOSIS — G20C Parkinsonism, unspecified: Secondary | ICD-10-CM

## 2019-12-03 DIAGNOSIS — F419 Anxiety disorder, unspecified: Secondary | ICD-10-CM

## 2019-12-03 DIAGNOSIS — N3946 Mixed incontinence: Secondary | ICD-10-CM | POA: Diagnosis not present

## 2019-12-03 HISTORY — DX: Mild cognitive impairment of uncertain or unknown etiology: G31.84

## 2019-12-03 NOTE — Progress Notes (Signed)
Location:  Occupational psychologist of Service:  Clinic (12)  Provider: Hulbert Branscome L. Mariea Clonts, D.O., C.M.D.  Code Status: DNR Goals of Care:  Advanced Directives 12/03/2019  Does Patient Have a Medical Advance Directive? Yes  Type of Advance Directive Out of facility DNR (pink MOST or yellow form)  Does patient want to make changes to medical advance directive? -  Copy of [place] in Chart? -  Pre-existing out of facility DNR order (yellow form or pink MOST form) Pink MOST form placed in chart (order not valid for inpatient use)     Chief Complaint  Patient presents with  . Medical Management of Chronic Issues    4 month follow up   . Health Maintenance    Influenza ([place])     HPI: Patient is a 86 y.o. female seen today for medical management of chronic diseases.    She's had a number of nose bleeds.  She is on eliquis 2.5mg  po bid.  Reviewed risk of stroke.  Reviewed prevention approaches for nose bleeds.  She will try vaseline to moisturize.    Her bladder needs help.  She's tried kegels and azo bladder control--has taken close to three weeks.     Every night she is up 2-4 times.  It's annoying.  She is also bothered in the day with frequency.    Hearing is not good.  She needs to see the audiologist--currently appt is end of year.  She forgot to go to lunch with a friend yesterday and was very disappointed that she forgot.  She knew it was Tuesday and she usually meets her Tuesday.  It just left her which worried her.  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  She knows it's to some extent the aging process. Asked about prevagen which I advised against.  Does crossword puzzles, went to the writing workshop, reads, makes lists.  She has decided she does not want to write so not going back to the writing workshop.  She exercises each am with Shirlean Mylar for the TV programs.  [place] to [place] 2[place] times per [place].  Not  walking outside.  Plans to walk outside some in the cooler weather.  She does jigsaw puzzles.  Discussed considering medication next year if needed when MMSE rechecked in march.    She saw the NP at neurology.   Her nerves vary.  Has family problems--medical concerns there weigh on her.  She lost a cousin to covid who was a year older (he'd been especially helpful to her after her husband's death).  Her daughter visits weekly and they have meals in the [place] dining [place].  Tremor intensity comes and goes. Avoids soup due to the tremor.  Says she still eats plenty.     Past Medical History:  Diagnosis Date  . Allergic rhinitis    pollen and grass  . Asthma   . Atrial fibrillation ([place]) 05/13/2018  . Blepharitis   . Carpal tunnel syndrome, bilateral 07/03/2016  . Family history of adverse reaction to anesthesia    " [place]   . Hyperlipidemia   . Irregular heart rhythm   . Macular degeneration 2012  . Osteoporosis   . Sensorineural hearing loss, bilateral 2007  . Shingles 2004  . Skin cancer 2005   nose  . Torn meniscus 2006   right knee  . Tremor 2006  familiar.  dx by Dr. Quillian Quince  . Unspecified essential hypertension   . Unspecified hypothyroidism   . Urinary frequency     Past Surgical History:  Procedure Laterality Date  . ABDOMINAL HYSTERECTOMY  1977  . CATARACT EXTRACTION  1974   Left, IOL implants  . CATARACT EXTRACTION Right 1979   IOL implants  . CHOLECYSTECTOMY  1981  . COSMETIC SURGERY  1995   eye  . [place]  . DILATION AND CURETTAGE OF UTERUS  1977  . [place]  . HIATAL HERNIA REPAIR  1977  . SKIN CANCER EXCISION  2005   nose  . THYROIDECTOMY  1955  . TONSILLECTOMY  1931    Allergies  Allergen Reactions  . Clindamycin/Lincomycin Diarrhea    diarrhea  . Ibuprofen Other (See Comments)    Long time ago/ list on paper work  . Penicillins Other (See Comments)    Did it  involve swelling of the face/tongue/throat, SOB, or low BP? Unknown Did it involve sudden or severe rash/hives, skin peeling, or any reaction on the inside of your mouth or nose? Unknown Did you need to seek medical attention at a hospital or doctor's office? Unknown When did it last happen? If all above answers are "NO", may proceed with cephalosporin use.    Outpatient Encounter Medications as of 12/03/2019  Medication Sig  . albuterol (PROVENTIL HFA;VENTOLIN HFA) 108 (90 Base) MCG/ACT inhaler Inhale 1 puff into the lungs every 6 (six) hours as needed for wheezing.   Marland Kitchen apixaban (ELIQUIS) 2.5 MG TABS tablet Take 1 tablet (2.5 mg total) by mouth 2 (two) times daily.  . Azelastine HCl 0.15 % SOLN USE 1 SPRAY NASALLY TWICE A DAY  . calcium carbonate (TUMS - DOSED IN MG ELEMENTAL CALCIUM) 500 MG chewable tablet Chew 1 tablet by mouth daily as needed for indigestion.  . Cholecalciferol (VITAMIN D) 2000 units CAPS Take 1 capsule (2,000 Units total) by mouth daily.  . Diclofenac Sodium 1 % CREA Place 1 application onto the skin as needed.  . docusate sodium (COLACE) 100 MG capsule Take 100 mg by mouth daily.    . fluticasone furoate-vilanterol (BREO ELLIPTA) 200-25 MCG/INH AEPB Inhale 1 puff into the lungs daily.  Marland Kitchen HYDROcodone-acetaminophen (NORCO/VICODIN) 5-325 MG tablet Take 1 tablet by mouth daily as needed for severe pain (in back).  . loratadine (CLARITIN) 10 MG tablet Take 1 tablet (10 mg total) by mouth daily.  . metoprolol succinate (TOPROL-XL) 25 MG 24 hr tablet Take 0.5 tablets (12.5 mg total) by mouth daily.  . Multiple Vitamins-Minerals (PRESERVISION AREDS PO) Take 1 tablet by mouth 2 (two) times daily.  Marland Kitchen Propylene Glycol (SYSTANE COMPLETE OP) Apply to eye.  Marland Kitchen Pumpkin Seed-Soy Germ (AZO BLADDER CONTROL/GO-LESS PO) Take 1 tablet by mouth daily.  . Saline (SIMPLY SALINE) 0.9 % AERS Place 1 spray into the nose daily.   . sertraline (ZOLOFT) 50 MG tablet Take 1 tablet (50 mg total)  by mouth daily.   No facility-administered encounter medications on file as of 12/03/2019.    Review of Systems:  Review of Systems  Constitutional: Negative for chills and fever.  HENT: Positive for hearing loss and nosebleeds.   Eyes: Positive for blurred vision.  Respiratory: Negative for cough and shortness of breath.   Cardiovascular: Negative for chest pain, palpitations and leg swelling.  Gastrointestinal: Negative for abdominal pain.  Genitourinary: Positive for frequency and urgency. Negative for dysuria.  Musculoskeletal: Positive for joint pain.  Negative for falls.  Skin: Negative for itching and rash.  Neurological: Positive for tingling and sensory change. Negative for dizziness and loss of consciousness.  Endo/Heme/Allergies: Bruises/bleeds easily.  Psychiatric/Behavioral: Positive for memory loss. Negative for depression. The patient is nervous/anxious. The patient does not have insomnia.     Health Maintenance  Topic Date Due  . INFLUENZA VACCINE  10/12/2019  . TETANUS/TDAP  04/07/2025  . DEXA SCAN  Completed  . COVID-19 Vaccine  Completed  . PNA vac Low Risk Adult  Completed    Physical Exam: Vitals:   12/03/19 0941  BP: 128/82  Pulse: 62  Temp: (!) 97.5 F (36.4 C)  TempSrc: Temporal  SpO2: 99%  Weight: 133 lb 3.2 oz (60.4 kg)  Height: 5' (1.524 m)   Body mass index is 26.01 kg/m. Physical Exam Vitals reviewed.  Constitutional:      Appearance: Normal appearance.  HENT:     Head: Normocephalic and atraumatic.     Ears:     Comments: HOH, uses hearing aids Eyes:     Comments: Wears glasses, vision poor   Cardiovascular:     Rate and Rhythm: Rhythm irregular.     Heart sounds: No murmur heard.   Pulmonary:     Effort: Pulmonary effort is normal.     Breath sounds: Normal breath sounds. No wheezing, rhonchi or rales.  Abdominal:     General: Bowel sounds are normal. There is no distension.     Tenderness: There is no abdominal tenderness.  There is no guarding or rebound.  Musculoskeletal:        General: Normal range of motion.     Right lower leg: No edema.     Left lower leg: Edema present.  Skin:    General: Skin is warm and dry.  Neurological:     General: No focal deficit present.     Mental Status: She is alert and oriented to person, place, and time.     Cranial Nerves: No cranial nerve deficit.     Motor: No weakness.     Gait: Gait normal.  Psychiatric:        Mood and Affect: Mood normal.        Behavior: Behavior normal.     Labs reviewed: Basic Metabolic Panel: Recent Labs    12/17/18 0000 07/18/19 0300  NA 141 141  K 4.6 4.7  CL 103 105  CO2 24 24*  BUN 19 17  CREATININE 0.8 0.7  CALCIUM 9.5 9.4   Liver Function Tests: Recent Labs    12/17/18 0000 07/18/19 0300  AST 22 16  ALT 20 13  ALKPHOS 54  --   PROT 6.4  --   ALBUMIN 3.9 3.7   No results for input(s): LIPASE, AMYLASE in the last 8760 hours. No results for input(s): AMMONIA in the last 8760 hours. CBC: Recent Labs    12/17/18 0000 07/18/19 0000 07/18/19 0300  WBC 4.6 4.9 4.9  HGB 39.4* 12.5  --   HCT 39 37  --   PLT 207 191  --     Lab Results  Component Value Date   HGBA1C 5.9 10/02/2017    Assessment/Plan 1. Mixed incontinence urge and stress -if her azo bladder control is not effective after the full 4 week course, we plan to start myrbetriq 25mg  po daily for her OAB  2. Parkinsonism, unspecified Parkinsonism type ([place]) -does not seem any worse, tremor continues that is labeled as essential with neuro  3. Anxiety -chronic, cont zoloft, says she liked xanax better, but memory getting worse even without it and it's also likely to have other negative side effects for her at 89   4. Asthma, intrinsic -doing fine, cont same regimen per pulmonary  5. Paroxysmal atrial fibrillation ([facility]) -continues on eliquis therapy -rate controlled with toprol xl  6. Mild cognitive impairment with memory loss -gradually  more notable -she is aware of changes but made the plan to move to [place] appropriately for care  7. Left leg swelling -chronic, with old trauma, no new interventions  Labs/tests ordered:  none Next appt:  04/07/2020  Nic Lampe L. Marjorie Lussier, D.O. [facility] [address], [place] [address] Cell Phone (Mon-Fri 8am-5pm):  [phone] On Call:  [phone] & follow prompts after 5pm & weekends Office Phone:  [phone] Office Fax:  [phone]

## 2019-12-10 ENCOUNTER — Emergency Department (HOSPITAL_COMMUNITY): Payer: Medicare Other

## 2019-12-10 ENCOUNTER — Inpatient Hospital Stay (HOSPITAL_COMMUNITY)
Admission: EM | Admit: 2019-12-10 | Discharge: 2019-12-15 | DRG: 074 | Disposition: A | Discharge status: Home Health | Payer: Medicare Other | Source: Skilled Nursing Facility | Attending: Internal Medicine | Admitting: Internal Medicine

## 2019-12-10 DIAGNOSIS — Z66 Do not resuscitate: Secondary | ICD-10-CM | POA: Diagnosis present

## 2019-12-10 DIAGNOSIS — I480 Paroxysmal atrial fibrillation: Secondary | ICD-10-CM | POA: Diagnosis not present

## 2019-12-10 DIAGNOSIS — I6782 Cerebral ischemia: Secondary | ICD-10-CM | POA: Diagnosis not present

## 2019-12-10 DIAGNOSIS — R54 Age-related physical debility: Secondary | ICD-10-CM | POA: Diagnosis present

## 2019-12-10 DIAGNOSIS — R55 Syncope and collapse: Secondary | ICD-10-CM | POA: Diagnosis not present

## 2019-12-10 DIAGNOSIS — Z7901 Long term (current) use of anticoagulants: Secondary | ICD-10-CM

## 2019-12-10 DIAGNOSIS — S51012A Laceration without foreign body of left elbow, initial encounter: Secondary | ICD-10-CM | POA: Diagnosis present

## 2019-12-10 DIAGNOSIS — I951 Orthostatic hypotension: Secondary | ICD-10-CM | POA: Diagnosis present

## 2019-12-10 DIAGNOSIS — I088 Other rheumatic multiple valve diseases: Secondary | ICD-10-CM | POA: Diagnosis present

## 2019-12-10 DIAGNOSIS — E039 Hypothyroidism, unspecified: Secondary | ICD-10-CM | POA: Diagnosis present

## 2019-12-10 DIAGNOSIS — Z20822 Contact with and (suspected) exposure to covid-19: Secondary | ICD-10-CM | POA: Diagnosis present

## 2019-12-10 DIAGNOSIS — R001 Bradycardia, unspecified: Secondary | ICD-10-CM | POA: Diagnosis not present

## 2019-12-10 DIAGNOSIS — I10 Essential (primary) hypertension: Secondary | ICD-10-CM | POA: Diagnosis not present

## 2019-12-10 DIAGNOSIS — H9190 Unspecified hearing loss, unspecified ear: Secondary | ICD-10-CM | POA: Diagnosis present

## 2019-12-10 DIAGNOSIS — Y92121 Bathroom in nursing home as the place of occurrence of the external cause: Secondary | ICD-10-CM

## 2019-12-10 DIAGNOSIS — Z880 Allergy status to penicillin: Secondary | ICD-10-CM

## 2019-12-10 DIAGNOSIS — I444 Left anterior fascicular block: Secondary | ICD-10-CM | POA: Diagnosis present

## 2019-12-10 DIAGNOSIS — E785 Hyperlipidemia, unspecified: Secondary | ICD-10-CM | POA: Diagnosis present

## 2019-12-10 DIAGNOSIS — F329 Major depressive disorder, single episode, unspecified: Secondary | ICD-10-CM | POA: Diagnosis present

## 2019-12-10 DIAGNOSIS — R4020 Unspecified coma: Secondary | ICD-10-CM | POA: Diagnosis not present

## 2019-12-10 DIAGNOSIS — I471 Supraventricular tachycardia: Secondary | ICD-10-CM | POA: Diagnosis not present

## 2019-12-10 DIAGNOSIS — M25522 Pain in left elbow: Secondary | ICD-10-CM | POA: Diagnosis not present

## 2019-12-10 DIAGNOSIS — S0101XA Laceration without foreign body of scalp, initial encounter: Secondary | ICD-10-CM

## 2019-12-10 DIAGNOSIS — Z888 Allergy status to other drugs, medicaments and biological substances status: Secondary | ICD-10-CM

## 2019-12-10 DIAGNOSIS — Z881 Allergy status to other antibiotic agents status: Secondary | ICD-10-CM

## 2019-12-10 DIAGNOSIS — R0689 Other abnormalities of breathing: Secondary | ICD-10-CM | POA: Diagnosis not present

## 2019-12-10 DIAGNOSIS — W19XXXA Unspecified fall, initial encounter: Secondary | ICD-10-CM

## 2019-12-10 DIAGNOSIS — M810 Age-related osteoporosis without current pathological fracture: Secondary | ICD-10-CM | POA: Diagnosis present

## 2019-12-10 DIAGNOSIS — I5030 Unspecified diastolic (congestive) heart failure: Secondary | ICD-10-CM | POA: Diagnosis not present

## 2019-12-10 DIAGNOSIS — M2578 Osteophyte, vertebrae: Secondary | ICD-10-CM | POA: Diagnosis not present

## 2019-12-10 DIAGNOSIS — G319 Degenerative disease of nervous system, unspecified: Secondary | ICD-10-CM | POA: Diagnosis not present

## 2019-12-10 DIAGNOSIS — G909 Disorder of the autonomic nervous system, unspecified: Secondary | ICD-10-CM | POA: Diagnosis present

## 2019-12-10 DIAGNOSIS — S199XXA Unspecified injury of neck, initial encounter: Secondary | ICD-10-CM | POA: Diagnosis not present

## 2019-12-10 DIAGNOSIS — W1830XA Fall on same level, unspecified, initial encounter: Secondary | ICD-10-CM | POA: Diagnosis present

## 2019-12-10 DIAGNOSIS — R42 Dizziness and giddiness: Secondary | ICD-10-CM

## 2019-12-10 DIAGNOSIS — I700 Atherosclerosis of aorta: Secondary | ICD-10-CM | POA: Diagnosis not present

## 2019-12-10 DIAGNOSIS — Z8673 Personal history of transient ischemic attack (TIA), and cerebral infarction without residual deficits: Secondary | ICD-10-CM

## 2019-12-10 DIAGNOSIS — H748X1 Other specified disorders of right middle ear and mastoid: Secondary | ICD-10-CM | POA: Diagnosis not present

## 2019-12-10 DIAGNOSIS — S0990XA Unspecified injury of head, initial encounter: Secondary | ICD-10-CM | POA: Diagnosis not present

## 2019-12-10 DIAGNOSIS — M47812 Spondylosis without myelopathy or radiculopathy, cervical region: Secondary | ICD-10-CM | POA: Diagnosis not present

## 2019-12-10 HISTORY — DX: Pure hypercholesterolemia, unspecified: E78.00

## 2019-12-10 HISTORY — DX: Unspecified hearing loss, unspecified ear: H91.90

## 2019-12-10 HISTORY — DX: Transient cerebral ischemic attack, unspecified: G45.9

## 2019-12-10 LAB — RESPIRATORY PANEL BY RT PCR (FLU A&B, COVID)
Influenza A by PCR: NEGATIVE
Influenza B by PCR: NEGATIVE
SARS Coronavirus 2 by RT PCR: NEGATIVE

## 2019-12-10 LAB — DIFFERENTIAL
Abs Immature Granulocytes: 0.05 10*3/uL (ref 0.00–0.07)
Basophils Absolute: 0 10*3/uL (ref 0.0–0.1)
Basophils Relative: 0 %
Eosinophils Absolute: 0 10*3/uL (ref 0.0–0.5)
Eosinophils Relative: 0 %
Immature Granulocytes: 1 %
Lymphocytes Relative: 13 %
Lymphs Abs: 1.3 10*3/uL (ref 0.7–4.0)
Monocytes Absolute: 0.7 10*3/uL (ref 0.1–1.0)
Monocytes Relative: 7 %
Neutro Abs: 7.6 10*3/uL (ref 1.7–7.7)
Neutrophils Relative %: 79 %

## 2019-12-10 LAB — CBC
HCT: 39.8 % (ref 36.0–46.0)
Hemoglobin: 13.1 g/dL (ref 12.0–15.0)
MCH: 31.3 pg (ref 26.0–34.0)
MCHC: 32.9 g/dL (ref 30.0–36.0)
MCV: 95.2 fL (ref 80.0–100.0)
Platelets: 229 10*3/uL (ref 150–400)
RBC: 4.18 MIL/uL (ref 3.87–5.11)
RDW: 14.1 % (ref 11.5–15.5)
WBC: 9.7 10*3/uL (ref 4.0–10.5)
nRBC: 0 % (ref 0.0–0.2)

## 2019-12-10 LAB — COMPREHENSIVE METABOLIC PANEL
ALT: 16 U/L (ref 0–44)
AST: 23 U/L (ref 15–41)
Albumin: 3.5 g/dL (ref 3.5–5.0)
Alkaline Phosphatase: 47 U/L (ref 38–126)
Anion gap: 9 (ref 5–15)
BUN: 18 mg/dL (ref 8–23)
CO2: 25 mmol/L (ref 22–32)
Calcium: 9.4 mg/dL (ref 8.9–10.3)
Chloride: 105 mmol/L (ref 98–111)
Creatinine, Ser: 0.86 mg/dL (ref 0.44–1.00)
GFR calc Af Amer: >60 mL/min (ref >60)
GFR calc non Af Amer: 58 mL/min — ABNORMAL LOW (ref >60)
Glucose, Bld: 123 mg/dL — ABNORMAL HIGH (ref 70–99)
Potassium: 3.9 mmol/L (ref 3.5–5.1)
Sodium: 139 mmol/L (ref 135–145)
Total Bilirubin: 0.9 mg/dL (ref 0.3–1.2)
Total Protein: 6.7 g/dL (ref 6.5–8.1)

## 2019-12-10 LAB — TROPONIN I (HIGH SENSITIVITY)
Troponin I (High Sensitivity): 4 ng/L (ref <18)
Troponin I (High Sensitivity): 4 ng/L (ref <18)

## 2019-12-10 LAB — CREATININE, SERUM
Creatinine, Ser: 0.72 mg/dL (ref 0.44–1.00)
GFR calc Af Amer: >60 mL/min (ref >60)
GFR calc non Af Amer: >60 mL/min (ref >60)

## 2019-12-10 LAB — TSH: TSH: 3.945 u[IU]/mL (ref 0.350–4.500)

## 2019-12-10 MED ORDER — APIXABAN 2.5 MG PO TABS
2.5000 mg | ORAL_TABLET | Freq: Two times a day (BID) | ORAL | Status: DC
  Administered 2019-12-10 – 2019-12-15 (×10): 2.5 mg via ORAL
  Filled 2019-12-10 (×12): qty 1

## 2019-12-10 MED ORDER — ACETAMINOPHEN 325 MG PO TABS
650.0000 mg | ORAL_TABLET | Freq: Four times a day (QID) | ORAL | Status: DC | PRN
  Administered 2019-12-10 – 2019-12-15 (×6): 650 mg via ORAL
  Filled 2019-12-10 (×6): qty 2

## 2019-12-10 MED ORDER — LIDOCAINE-EPINEPHRINE 1 %-1:100000 IJ SOLN
10.0000 mL | Freq: Once | INTRAMUSCULAR | Status: AC
  Administered 2019-12-10: 10 mL
  Filled 2019-12-10: qty 1

## 2019-12-10 MED ORDER — ACETAMINOPHEN 500 MG PO TABS
1000.0000 mg | ORAL_TABLET | Freq: Once | ORAL | Status: AC
  Administered 2019-12-10: 1000 mg via ORAL
  Filled 2019-12-10: qty 2

## 2019-12-10 MED ORDER — METOPROLOL SUCCINATE ER 25 MG PO TB24
12.5000 mg | ORAL_TABLET | Freq: Every day | ORAL | Status: DC
  Administered 2019-12-10 – 2019-12-11 (×2): 12.5 mg via ORAL
  Filled 2019-12-10 (×3): qty 1

## 2019-12-10 MED ORDER — LACTATED RINGERS IV SOLN: INTRAVENOUS | Status: AC

## 2019-12-10 MED ORDER — SODIUM CHLORIDE 0.9 % IV BOLUS
500.0000 mL | Freq: Once | INTRAVENOUS | Status: AC
  Administered 2019-12-10: 500 mL via INTRAVENOUS

## 2019-12-10 MED ORDER — SERTRALINE HCL 50 MG PO TABS
50.0000 mg | ORAL_TABLET | Freq: Every day | ORAL | Status: DC
  Administered 2019-12-10 – 2019-12-15 (×6): 50 mg via ORAL
  Filled 2019-12-10 (×8): qty 1

## 2019-12-10 MED ORDER — LACTATED RINGERS IV SOLN: INTRAVENOUS | Status: DC

## 2019-12-10 NOTE — ED Triage Notes (Signed)
Patient arrived via [facility] stating she was headed to the shower and she fell. Patient thinks she passed out. Patient has a laceration to back of head and skin tear to left arm. Patient is on Eliquis. CBG-133. EMS VS- 185/103, 96% on RA, 97.7, 60

## 2019-12-10 NOTE — ED Notes (Addendum)
This RN notified MD due to pt HTN crisis pt had last 3 BP OF 208/88, 218/77 & 195/129   2304 Pt reports on-going headache after tylenol administration, Notified MD

## 2019-12-10 NOTE — ED Provider Notes (Signed)
Emergency Department Provider Note   I have reviewed the triage vital signs and the nursing notes.   HISTORY  Chief Complaint Fall   HPI Christina Lawson is a 86 y.o. female with PMH of A-fib on Eliquis presents to the emergency department for evaluation after fall.  Patient states that she was in the bathroom getting ready to take a shower when she felt woozy and lightheaded.  She is unsure if she passed out but fell to the ground striking the back of her head.  She is having some pain in the left elbow as well.  She denies any pain in the chest, shortness of breath, heart palpitations.  She is currently living in assisted living at [facility] and staff arrived to find her on the ground.  EMS was called and she was activated as a level 2 trauma per protocol with ground-level fall on anticoagulation.  She not experiencing numbness or weakness.  With rolling her movement she does feel lightheaded but denies vertigo. No abdominal pr back pain. No pain with moving arms/legs with the exception of mild soreness in the left elbow.   No past medical history on file.  Patient Active Problem List   Diagnosis Date Noted  . Syncope and collapse 12/10/2019  . AF (paroxysmal atrial fibrillation) ([place]) 12/10/2019   Allergies Patient has no known allergies.  No family history on file.  Social History Social History   Tobacco Use  . Smoking status: Not on file  Substance Use Topics  . Alcohol use: Not on file  . Drug use: Not on file    Review of Systems  Constitutional: No fever/chills Eyes: No visual changes. ENT: No sore throat. Cardiovascular: Denies chest pain. Positive lightheadedness.  Respiratory: Denies shortness of breath. Gastrointestinal: No abdominal pain.  No nausea, no vomiting.  No diarrhea.  No constipation. Genitourinary: Negative for dysuria. Musculoskeletal: Negative for back pain. Skin: Positive scalp laceration and skin tear to the left elbow.    Neurological: Negative for focal weakness or numbness. Positive HA.   10-point ROS otherwise negative.  ____________________________________________   PHYSICAL EXAM:  VITAL SIGNS: Vitals:   12/11/19 0700 12/11/19 0715  BP: (!) 194/83   Pulse: (!) 59 60  Resp: (!) 23 20  Temp:    SpO2: 95% 97%     Constitutional: Alert and oriented. Well appearing and in no acute distress. Eyes: Conjunctivae are normal. PERRL.  Head: Laceration to the occipital scalp. No active bleeding.  Nose: No congestion/rhinnorhea. Mouth/Throat: Mucous membranes are moist.  Oropharynx non-erythematous. Neck: No stridor. C-collar in place on arrival.  Cardiovascular: Normal rate, regular rhythm. Good peripheral circulation. Grossly normal heart sounds.   Respiratory: Normal respiratory effort.  No retractions. Lungs CTAB. Gastrointestinal: Soft and nontender. No distention.  Musculoskeletal: No gross deformities of extremities.  Normal active range of motion of all extremities upper and lower.  Neurologic:  Normal speech and language. No gross focal neurologic deficits are appreciated.  Skin: Scalp laceration to the occipital scalp and skin tear to the left elbow.   ____________________________________________   LABS (all labs ordered are listed, but only abnormal results are displayed)  Labs Reviewed  COMPREHENSIVE METABOLIC PANEL - Abnormal; Notable for the following components:      Result Value   Glucose, Bld 123 (*)    GFR calc non Af Amer 58 (*)    All other components within normal limits  COMPREHENSIVE METABOLIC PANEL - Abnormal; Notable for the following components:   Glucose,  Bld 108 (*)    Albumin 3.3 (*)    Total Bilirubin 1.3 (*)    All other components within normal limits  RESPIRATORY PANEL BY RT PCR (FLU A&B, COVID)  CBC  CREATININE, SERUM  TSH  CBC  DIFFERENTIAL  CBC WITH DIFFERENTIAL/PLATELET  CBC WITH DIFFERENTIAL/PLATELET  TROPONIN I (HIGH SENSITIVITY)  TROPONIN I  (HIGH SENSITIVITY)  TROPONIN I (HIGH SENSITIVITY)   ____________________________________________  EKG   EKG Interpretation  Date/Time:  Wednesday December 10 2019 15:21:46 EDT Ventricular Rate:  68 PR Interval:    QRS Duration: 83 QT Interval:  403 QTC Calculation: 429 R Axis:   -56 Text Interpretation: Sinus rhythm Left anterior fascicular block Probable anteroseptal infarct, old Nonspecific T abnormalities, lateral leads When compared with ECG of EARLIER SAME DATE No significant change was found Confirmed by Delora Fuel ([id number]) on 12/10/2019 11:30:51 PM       ____________________________________________  RADIOLOGY  DG Elbow Complete Left  Result Date: 12/10/2019 CLINICAL DATA:  Pain following fall EXAM: LEFT ELBOW - COMPLETE 3+ VIEW COMPARISON:  None. FINDINGS: Frontal, lateral, and bilateral oblique views were obtained. No fracture or dislocation. No joint effusion. Joint spaces appear normal. No erosive change. IMPRESSION: No fracture or dislocation.  No evident arthropathy. Electronically Signed   By: Lowella Grip III M.D.   On: 12/10/2019 13:10   CT Head Wo Contrast  Result Date: 12/10/2019 CLINICAL DATA:  Head and neck trauma EXAM: CT HEAD WITHOUT CONTRAST CT CERVICAL SPINE WITHOUT CONTRAST TECHNIQUE: Multidetector CT imaging of the head and cervical spine was performed following the standard protocol without intravenous contrast. Multiplanar CT image reconstructions of the cervical spine were also generated. COMPARISON:  09/18/2017 CT head FINDINGS: CT HEAD FINDINGS Brain: Atrophy pattern and advanced white matter microvascular ischemic changes throughout both cerebral hemispheres. No acute intracranial hemorrhage, definite new infarction, large mass lesion, midline shift, herniation, hydrocephalus, or extra-axial fluid collection. No focal mass effect or edema. Cisterns are patent. No gross cerebellar abnormality. Vascular: No hyperdense vessel or unexpected  calcification. Skull: Normal. Negative for fracture or focal lesion. Sinuses/Orbits: No orbital abnormality. Sinuses clear. Chronic right mastoid effusion. Other: None. CT CERVICAL SPINE FINDINGS Alignment: Normal. Skull base and vertebrae: No acute fracture. No primary bone lesion or focal pathologic process. Soft tissues and spinal canal: No prevertebral fluid or swelling. No visible canal hematoma. Disc levels: Advanced cervical degenerative changes spanning C3-C7. Ankylosis at C3-4 and C4-5 appearing chronic. C5-6 and C6-7 demonstrate marked disc space narrowing, sclerosis and osteophytes. Degenerative changes of the C1-2 articulation as well. Multilevel facet arthropathy.  No subluxation or dislocation. Upper chest: Visualized lung apices are clear. Aorta atherosclerotic. Other: None. IMPRESSION: Atrophy and advanced chronic white matter microvascular changes. No acute intracranial abnormality by noncontrast CT. Advanced cervical degenerative changes spanning C3-C7. No acute osseous finding, fracture, malalignment by CT. Electronically Signed   By: Jerilynn Mages.  Shick M.D.   On: 12/10/2019 13:26   CT Cervical Spine Wo Contrast  Result Date: 12/10/2019 CLINICAL DATA:  Head and neck trauma EXAM: CT HEAD WITHOUT CONTRAST CT CERVICAL SPINE WITHOUT CONTRAST TECHNIQUE: Multidetector CT imaging of the head and cervical spine was performed following the standard protocol without intravenous contrast. Multiplanar CT image reconstructions of the cervical spine were also generated. COMPARISON:  09/18/2017 CT head FINDINGS: CT HEAD FINDINGS Brain: Atrophy pattern and advanced white matter microvascular ischemic changes throughout both cerebral hemispheres. No acute intracranial hemorrhage, definite new infarction, large mass lesion, midline shift, herniation, hydrocephalus, or extra-axial fluid  collection. No focal mass effect or edema. Cisterns are patent. No gross cerebellar abnormality. Vascular: No hyperdense vessel or  unexpected calcification. Skull: Normal. Negative for fracture or focal lesion. Sinuses/Orbits: No orbital abnormality. Sinuses clear. Chronic right mastoid effusion. Other: None. CT CERVICAL SPINE FINDINGS Alignment: Normal. Skull base and vertebrae: No acute fracture. No primary bone lesion or focal pathologic process. Soft tissues and spinal canal: No prevertebral fluid or swelling. No visible canal hematoma. Disc levels: Advanced cervical degenerative changes spanning C3-C7. Ankylosis at C3-4 and C4-5 appearing chronic. C5-6 and C6-7 demonstrate marked disc space narrowing, sclerosis and osteophytes. Degenerative changes of the C1-2 articulation as well. Multilevel facet arthropathy.  No subluxation or dislocation. Upper chest: Visualized lung apices are clear. Aorta atherosclerotic. Other: None. IMPRESSION: Atrophy and advanced chronic white matter microvascular changes. No acute intracranial abnormality by noncontrast CT. Advanced cervical degenerative changes spanning C3-C7. No acute osseous finding, fracture, malalignment by CT. Electronically Signed   By: Jerilynn Mages.  Shick M.D.   On: 12/10/2019 13:26    ____________________________________________   PROCEDURES  Procedure(s) performed:   Marland KitchenMarland KitchenLaceration Repair  Date/Time: 12/10/2019 3:10 PM Performed by: Margette Fast, MD Authorized by: Margette Fast, MD   Consent:    Consent obtained:  Verbal   Consent given by:  Patient   Risks discussed:  Infection, need for additional repair, nerve damage, poor wound healing, poor cosmetic result, pain, retained foreign body and vascular damage   Alternatives discussed:  No treatment Anesthesia (see MAR for exact dosages):    Anesthesia method:  Local infiltration   Local anesthetic:  Lidocaine 2% WITH epi Laceration details:    Location:  Scalp   Scalp location:  Occipital   Length (cm):  4 Repair type:    Repair type:  Intermediate (Stellate pattern) Pre-procedure details:    Preparation:  Patient  was prepped and draped in usual sterile fashion and imaging obtained to evaluate for foreign bodies Exploration:    Hemostasis achieved with:  Direct pressure   Wound exploration: wound explored through full range of motion and entire depth of wound probed and visualized     Wound extent: no areolar tissue violation noted, no fascia violation noted, no foreign bodies/material noted, no muscle damage noted, no nerve damage noted, no underlying fracture noted and no vascular damage noted     Contaminated: no   Treatment:    Area cleansed with:  Betadine and saline   Amount of cleaning:  Standard   Irrigation solution:  Sterile saline   Irrigation volume:  250   Irrigation method:  Pressure wash   Visualized foreign bodies/material removed: no   Skin repair:    Repair method:  Staples   Number of staples:  3 Approximation:    Approximation:  Close Post-procedure details:    Dressing:  Open (no dressing)   Patient tolerance of procedure:  Tolerated well, no immediate complications     ____________________________________________   INITIAL IMPRESSION / ASSESSMENT AND PLAN / ED COURSE  Pertinent labs & imaging results that were available during my care of the patient were reviewed by me and considered in my medical decision making (see chart for details).   Patient arrives as a level 2 trauma after ground-level fall with head injury on anticoagulation.  There is a scalp laceration noted which require repair likely with staples.  03:00 PM  Attempted to stand and became very subjectively lightheaded.  She felt like she needed to sit down immediately.  She is not  having vertigo type symptoms.  No focal neurologic deficits on my exam.  Given her lightheadedness with position change plan to continue IV fluids and admit for IV fluid hydration.  Have called the lab and the CBC is still pending.  Will try and get this to result to redraw the value.  Troponin is normal and chemistry is largely  unremarkable.  Discussed patient's case with [facility] to request admission. Patient and family (if present) updated with plan. Care transferred to [facility] service.  I reviewed all nursing notes, vitals, pertinent old records, EKGs, labs, imaging (as available).  ____________________________________________  FINAL CLINICAL IMPRESSION(S) / ED DIAGNOSES  Final diagnoses:  Fall, initial encounter  Episodic lightheadedness  Laceration of scalp, initial encounter     MEDICATIONS GIVEN DURING THIS VISIT:  Medications  lactated ringers infusion ( Intravenous Stopped 12/11/19 0254)  acetaminophen (TYLENOL) tablet 650 mg (650 mg Oral Given 12/10/19 2223)  metoprolol succinate (TOPROL-XL) 24 hr tablet 12.5 mg (12.5 mg Oral Given 12/10/19 2106)  apixaban (ELIQUIS) tablet 2.5 mg (2.5 mg Oral Given 12/10/19 2220)  sertraline (ZOLOFT) tablet 50 mg (50 mg Oral Given 12/10/19 2105)  sodium chloride 0.9 % bolus 500 mL (0 mLs Intravenous Stopped 12/10/19 1523)  lidocaine-EPINEPHrine (XYLOCAINE W/EPI) 1 %-1:100000 (with pres) injection 10 mL (10 mLs Infiltration Given 12/10/19 1526)  acetaminophen (TYLENOL) tablet 1,000 mg (1,000 mg Oral Given 12/10/19 1527)    Note:  This document was prepared using Dragon voice recognition software and may include unintentional dictation errors.  Nanda Quinton, MD, [facility]    Arden Axon, [facility], MD 12/11/19 1007

## 2019-12-10 NOTE — H&P (Addendum)
History and Physical    Christina Lawson JJO:[id number] DOB: 1923-12-27 DOA: 12/10/2019  Referring MD/NP/PA: EDP PCP:  Patient coming from: [facility]  Chief Complaint: Passed out  HPI: Christina Lawson is a 86 y.o. female with medical history significant of paroxysmal atrial fibrillation, hypothyroidism, essential hypertension, osteoporosis who is a resident of [facility] assisted [facility] facility was in her usual state of health when she woke up this morning, went to use the bathroom and then found herself on the floor with a laceration on the back of her scalp. -After she woke up she had soreness in the back of her scalp and her left elbow -She denied any chest pain shortness of breath palpitations or dizziness ED Course: Work-up in the ED noted laceration on her posterior scalp and left elbow  Review of Systems: As per HPI otherwise 14 point review of systems negative.   past medical history  -Essential hypertension -Paroxysmal atrial fibrillation -Osteoporosis  Social history -Denies any history of alcohol or tobacco use, lives at assisted living  Family history -Unknown per patient   Prior to Admission medications   Not on File    Physical Exam: Vitals:   12/10/19 1215 12/10/19 1445 12/10/19 1521 12/10/19 1535  BP: (!) 177/59 (!) 139/92 (!) 165/64   Pulse: 62 80 67   Resp: (!) 25 (!) 21 (!) 22   Temp:   (!) 97.5 F (36.4 C)   TempSrc:      SpO2: 98% 95% 97%   Weight:    54.4 kg  Height:    5\' 2"  (1.575 m)      Constitutional: Elderly pleasant female hard of hearing, awake alert oriented x3 Vitals:   12/10/19 1215 12/10/19 1445 12/10/19 1521 12/10/19 1535  BP: (!) 177/59 (!) 139/92 (!) 165/64   Pulse: 62 80 67   Resp: (!) 25 (!) 21 (!) 22   Temp:   (!) 97.5 F (36.4 C)   TempSrc:      SpO2: 98% 95% 97%   Weight:    54.4 kg  Height:    5\' 2"  (1.575 m)   HEENT: Laceration in the back of his scalp with multiple staples Respiratory:  Clear Cardiovascular: S1-S2, regular rate rhythm Abdomen: soft, non tender, Bowel sounds positive.  Musculoskeletal: No joint deformity upper and lower extremities. Ext: No edema Skin: Laceration on the scalp and left elbow Neurologic: Alert and oriented, moves all extremities no localizing sign Psychiatric: Normal judgment and insight. Alert and oriented x 3. Normal mood.   Labs on Admission: I have personally reviewed following labs and imaging studies  CBC: No results for input(s): WBC, NEUTROABS, HGB, HCT, MCV, PLT in the last 168 hours. Basic Metabolic Panel: Recent Labs  Lab 12/10/19 1203  NA 139  K 3.9  CL 105  CO2 25  GLUCOSE 123*  BUN 18  CREATININE 0.86  CALCIUM 9.4   GFR: Estimated Creatinine Clearance: 31.6 mL/min (by C-G formula based on SCr of 0.86 mg/dL). Liver Function Tests: Recent Labs  Lab 12/10/19 1203  AST 23  ALT 16  ALKPHOS 47  BILITOT 0.9  PROT 6.7  ALBUMIN 3.5   No results for input(s): LIPASE, AMYLASE in the last 168 hours. No results for input(s): AMMONIA in the last 168 hours. Coagulation Profile: No results for input(s): INR, PROTIME in the last 168 hours. Cardiac Enzymes: No results for input(s): CKTOTAL, CKMB, CKMBINDEX, TROPONINI in the last 168 hours. BNP (last 3 results) No results for input(s): PROBNP  in the last 8760 hours. HbA1C: No results for input(s): HGBA1C in the last 72 hours. CBG: No results for input(s): GLUCAP in the last 168 hours. Lipid Profile: No results for input(s): CHOL, HDL, LDLCALC, TRIG, CHOLHDL, LDLDIRECT in the last 72 hours. Thyroid Function Tests: No results for input(s): TSH, T4TOTAL, FREET4, T3FREE, THYROIDAB in the last 72 hours. Anemia Panel: No results for input(s): VITAMINB12, FOLATE, FERRITIN, TIBC, IRON, RETICCTPCT in the last 72 hours. Urine analysis: No results found for: COLORURINE, APPEARANCEUR, LABSPEC, PHURINE, GLUCOSEU, HGBUR, BILIRUBINUR, KETONESUR, PROTEINUR, UROBILINOGEN, NITRITE,  LEUKOCYTESUR Sepsis Labs: @LABRCNTIP (procalcitonin:4,lacticidven:4) )No results found for this or any previous visit (from the past 240 hour(s)).   Radiological Exams on Admission: DG Elbow Complete Left  Result Date: 12/10/2019 CLINICAL DATA:  Pain following fall EXAM: LEFT ELBOW - COMPLETE 3+ VIEW COMPARISON:  None. FINDINGS: Frontal, lateral, and bilateral oblique views were obtained. No fracture or dislocation. No joint effusion. Joint spaces appear normal. No erosive change. IMPRESSION: No fracture or dislocation.  No evident arthropathy. Electronically Signed   By: Lowella Grip III M.D.   On: 12/10/2019 13:10   CT Head Wo Contrast  Result Date: 12/10/2019 CLINICAL DATA:  Head and neck trauma EXAM: CT HEAD WITHOUT CONTRAST CT CERVICAL SPINE WITHOUT CONTRAST TECHNIQUE: Multidetector CT imaging of the head and cervical spine was performed following the standard protocol without intravenous contrast. Multiplanar CT image reconstructions of the cervical spine were also generated. COMPARISON:  09/18/2017 CT head FINDINGS: CT HEAD FINDINGS Brain: Atrophy pattern and advanced white matter microvascular ischemic changes throughout both cerebral hemispheres. No acute intracranial hemorrhage, definite new infarction, large mass lesion, midline shift, herniation, hydrocephalus, or extra-axial fluid collection. No focal mass effect or edema. Cisterns are patent. No gross cerebellar abnormality. Vascular: No hyperdense vessel or unexpected calcification. Skull: Normal. Negative for fracture or focal lesion. Sinuses/Orbits: No orbital abnormality. Sinuses clear. Chronic right mastoid effusion. Other: None. CT CERVICAL SPINE FINDINGS Alignment: Normal. Skull base and vertebrae: No acute fracture. No primary bone lesion or focal pathologic process. Soft tissues and spinal canal: No prevertebral fluid or swelling. No visible canal hematoma. Disc levels: Advanced cervical degenerative changes spanning C3-C7.  Ankylosis at C3-4 and C4-5 appearing chronic. C5-6 and C6-7 demonstrate marked disc space narrowing, sclerosis and osteophytes. Degenerative changes of the C1-2 articulation as well. Multilevel facet arthropathy.  No subluxation or dislocation. Upper chest: Visualized lung apices are clear. Aorta atherosclerotic. Other: None. IMPRESSION: Atrophy and advanced chronic white matter microvascular changes. No acute intracranial abnormality by noncontrast CT. Advanced cervical degenerative changes spanning C3-C7. No acute osseous finding, fracture, malalignment by CT. Electronically Signed   By: Jerilynn Mages.  Shick M.D.   On: 12/10/2019 13:26   CT Cervical Spine Wo Contrast  Result Date: 12/10/2019 CLINICAL DATA:  Head and neck trauma EXAM: CT HEAD WITHOUT CONTRAST CT CERVICAL SPINE WITHOUT CONTRAST TECHNIQUE: Multidetector CT imaging of the head and cervical spine was performed following the standard protocol without intravenous contrast. Multiplanar CT image reconstructions of the cervical spine were also generated. COMPARISON:  09/18/2017 CT head FINDINGS: CT HEAD FINDINGS Brain: Atrophy pattern and advanced white matter microvascular ischemic changes throughout both cerebral hemispheres. No acute intracranial hemorrhage, definite new infarction, large mass lesion, midline shift, herniation, hydrocephalus, or extra-axial fluid collection. No focal mass effect or edema. Cisterns are patent. No gross cerebellar abnormality. Vascular: No hyperdense vessel or unexpected calcification. Skull: Normal. Negative for fracture or focal lesion. Sinuses/Orbits: No orbital abnormality. Sinuses clear. Chronic right mastoid effusion. Other:  None. CT CERVICAL SPINE FINDINGS Alignment: Normal. Skull base and vertebrae: No acute fracture. No primary bone lesion or focal pathologic process. Soft tissues and spinal canal: No prevertebral fluid or swelling. No visible canal hematoma. Disc levels: Advanced cervical degenerative changes spanning  C3-C7. Ankylosis at C3-4 and C4-5 appearing chronic. C5-6 and C6-7 demonstrate marked disc space narrowing, sclerosis and osteophytes. Degenerative changes of the C1-2 articulation as well. Multilevel facet arthropathy.  No subluxation or dislocation. Upper chest: Visualized lung apices are clear. Aorta atherosclerotic. Other: None. IMPRESSION: Atrophy and advanced chronic white matter microvascular changes. No acute intracranial abnormality by noncontrast CT. Advanced cervical degenerative changes spanning C3-C7. No acute osseous finding, fracture, malalignment by CT. Electronically Signed   By: Jerilynn Mages.  Shick M.D.   On: 12/10/2019 13:26    EKG: Independently reviewed.  Normal sinus rhythm, Q waves in lead III and aVF which is unchanged from prior  Assessment/Plan Active Problems:    Syncope and collapse -Etiology not clear, no prodrome, suspect cardiac origin, possibly conduction defects -Monitor on telemetry overnight, check orthostatics -Gentle IV fluids overnight -Clinically no symptoms suspicious for PE or ACS, troponins are negative, EKG with Q waves in inferior leads which is unchanged from prior -May benefit from 30-day monitor after discharge, she is followed by Dr. Margaretann Loveless with [facility] -Also check TSH and echo    AF (paroxysmal atrial fibrillation) ([place])  -In sinus rhythm at this time, resume Toprol and Eliquis  Depression -Resume Zoloft  Hypothyroidism -check Tsh  HTN -Wide fluctuations in blood pressure noted from 120s to 175 -Toprol resumed -Monitor, titrate and add additional antihypertensives as pressures are persistently high   DVT prophylaxis: eliquis Code Status: DNR  Family Communication: Discussed with patient in detail, no family at bedside Disposition Plan: Back to assisted living tomorrow if stable Consults called: None Admission status: Observation  Domenic Polite MD [facility]  12/10/2019, 4:26 PM

## 2019-12-10 NOTE — ED Notes (Signed)
Unable to ambulate patient. Patient unsteady on her feet

## 2019-12-10 NOTE — Progress Notes (Signed)
Orthopedic Tech Progress Note Patient Details:  Christina Lawson 1923/05/28 [id number] Level 2 trauma Patient ID: [name], female   DOB: 06/29/1923, 86 y.o.   MRN: [id number]   [name] 12/10/2019, 1:04 PM

## 2019-12-11 ENCOUNTER — Observation Stay (HOSPITAL_BASED_OUTPATIENT_CLINIC_OR_DEPARTMENT_OTHER): Payer: Medicare Other

## 2019-12-11 ENCOUNTER — Other Ambulatory Visit: Payer: Self-pay

## 2019-12-11 ENCOUNTER — Encounter (HOSPITAL_COMMUNITY): Payer: Self-pay | Admitting: Internal Medicine

## 2019-12-11 DIAGNOSIS — S0101XA Laceration without foreign body of scalp, initial encounter: Secondary | ICD-10-CM

## 2019-12-11 DIAGNOSIS — Z66 Do not resuscitate: Secondary | ICD-10-CM | POA: Diagnosis present

## 2019-12-11 DIAGNOSIS — Z8673 Personal history of transient ischemic attack (TIA), and cerebral infarction without residual deficits: Secondary | ICD-10-CM | POA: Diagnosis not present

## 2019-12-11 DIAGNOSIS — F329 Major depressive disorder, single episode, unspecified: Secondary | ICD-10-CM | POA: Diagnosis present

## 2019-12-11 DIAGNOSIS — S51012A Laceration without foreign body of left elbow, initial encounter: Secondary | ICD-10-CM | POA: Diagnosis present

## 2019-12-11 DIAGNOSIS — R55 Syncope and collapse: Secondary | ICD-10-CM

## 2019-12-11 DIAGNOSIS — R42 Dizziness and giddiness: Secondary | ICD-10-CM | POA: Diagnosis not present

## 2019-12-11 DIAGNOSIS — Z880 Allergy status to penicillin: Secondary | ICD-10-CM | POA: Diagnosis not present

## 2019-12-11 DIAGNOSIS — M810 Age-related osteoporosis without current pathological fracture: Secondary | ICD-10-CM | POA: Diagnosis present

## 2019-12-11 DIAGNOSIS — Z7901 Long term (current) use of anticoagulants: Secondary | ICD-10-CM | POA: Diagnosis not present

## 2019-12-11 DIAGNOSIS — E785 Hyperlipidemia, unspecified: Secondary | ICD-10-CM | POA: Diagnosis present

## 2019-12-11 DIAGNOSIS — I5030 Unspecified diastolic (congestive) heart failure: Secondary | ICD-10-CM | POA: Diagnosis not present

## 2019-12-11 DIAGNOSIS — I480 Paroxysmal atrial fibrillation: Secondary | ICD-10-CM

## 2019-12-11 DIAGNOSIS — W1830XA Fall on same level, unspecified, initial encounter: Secondary | ICD-10-CM | POA: Diagnosis present

## 2019-12-11 DIAGNOSIS — Z888 Allergy status to other drugs, medicaments and biological substances status: Secondary | ICD-10-CM | POA: Diagnosis not present

## 2019-12-11 DIAGNOSIS — H9190 Unspecified hearing loss, unspecified ear: Secondary | ICD-10-CM | POA: Diagnosis present

## 2019-12-11 DIAGNOSIS — I088 Other rheumatic multiple valve diseases: Secondary | ICD-10-CM | POA: Diagnosis present

## 2019-12-11 DIAGNOSIS — R54 Age-related physical debility: Secondary | ICD-10-CM | POA: Diagnosis present

## 2019-12-11 DIAGNOSIS — G909 Disorder of the autonomic nervous system, unspecified: Secondary | ICD-10-CM | POA: Diagnosis present

## 2019-12-11 DIAGNOSIS — Y92121 Bathroom in nursing home as the place of occurrence of the external cause: Secondary | ICD-10-CM | POA: Diagnosis not present

## 2019-12-11 DIAGNOSIS — I471 Supraventricular tachycardia: Secondary | ICD-10-CM | POA: Diagnosis not present

## 2019-12-11 DIAGNOSIS — I10 Essential (primary) hypertension: Secondary | ICD-10-CM | POA: Diagnosis present

## 2019-12-11 DIAGNOSIS — W19XXXA Unspecified fall, initial encounter: Secondary | ICD-10-CM | POA: Diagnosis not present

## 2019-12-11 DIAGNOSIS — Z881 Allergy status to other antibiotic agents status: Secondary | ICD-10-CM | POA: Diagnosis not present

## 2019-12-11 DIAGNOSIS — Z20822 Contact with and (suspected) exposure to covid-19: Secondary | ICD-10-CM | POA: Diagnosis present

## 2019-12-11 DIAGNOSIS — I444 Left anterior fascicular block: Secondary | ICD-10-CM | POA: Diagnosis present

## 2019-12-11 DIAGNOSIS — E039 Hypothyroidism, unspecified: Secondary | ICD-10-CM | POA: Diagnosis present

## 2019-12-11 DIAGNOSIS — I951 Orthostatic hypotension: Secondary | ICD-10-CM | POA: Diagnosis present

## 2019-12-11 HISTORY — DX: Syncope and collapse: R55

## 2019-12-11 LAB — ECHOCARDIOGRAM COMPLETE
Area-P 1/2: 2.21 cm2
Height: 62 in
S' Lateral: 2.3 cm
Weight: 1920 oz

## 2019-12-11 LAB — COMPREHENSIVE METABOLIC PANEL
ALT: 19 U/L (ref 0–44)
AST: 27 U/L (ref 15–41)
Albumin: 3.3 g/dL — ABNORMAL LOW (ref 3.5–5.0)
Alkaline Phosphatase: 54 U/L (ref 38–126)
Anion gap: 10 (ref 5–15)
BUN: 12 mg/dL (ref 8–23)
CO2: 24 mmol/L (ref 22–32)
Calcium: 9.1 mg/dL (ref 8.9–10.3)
Chloride: 102 mmol/L (ref 98–111)
Creatinine, Ser: 0.72 mg/dL (ref 0.44–1.00)
GFR calc Af Amer: >60 mL/min (ref >60)
GFR calc non Af Amer: >60 mL/min (ref >60)
Glucose, Bld: 108 mg/dL — ABNORMAL HIGH (ref 70–99)
Potassium: 3.7 mmol/L (ref 3.5–5.1)
Sodium: 136 mmol/L (ref 135–145)
Total Bilirubin: 1.3 mg/dL — ABNORMAL HIGH (ref 0.3–1.2)
Total Protein: 6.5 g/dL (ref 6.5–8.1)

## 2019-12-11 LAB — CBC
HCT: 39.8 % (ref 36.0–46.0)
Hemoglobin: 13.3 g/dL (ref 12.0–15.0)
MCH: 31.1 pg (ref 26.0–34.0)
MCHC: 33.4 g/dL (ref 30.0–36.0)
MCV: 93 fL (ref 80.0–100.0)
Platelets: 211 10*3/uL (ref 150–400)
RBC: 4.28 MIL/uL (ref 3.87–5.11)
RDW: 13.9 % (ref 11.5–15.5)
WBC: 8.5 10*3/uL (ref 4.0–10.5)
nRBC: 0 % (ref 0.0–0.2)

## 2019-12-11 MED ORDER — HYDRALAZINE HCL 20 MG/ML IJ SOLN: 5.0000 mg | Freq: Four times a day (QID) | INTRAMUSCULAR | Status: DC | PRN

## 2019-12-11 MED ORDER — METOPROLOL SUCCINATE ER 25 MG PO TB24: 25.0000 mg | ORAL_TABLET | Freq: Every day | ORAL | Status: DC

## 2019-12-11 MED ORDER — METOPROLOL SUCCINATE ER 25 MG PO TB24
12.5000 mg | ORAL_TABLET | Freq: Every day | ORAL | Status: DC
  Administered 2019-12-12 – 2019-12-15 (×4): 12.5 mg via ORAL
  Filled 2019-12-11 (×4): qty 1

## 2019-12-11 MED ORDER — AMLODIPINE BESYLATE 5 MG PO TABS
5.0000 mg | ORAL_TABLET | Freq: Every day | ORAL | Status: DC
  Administered 2019-12-11 – 2019-12-15 (×5): 5 mg via ORAL
  Filled 2019-12-11 (×4): qty 1
  Filled 2019-12-11: qty 2

## 2019-12-11 NOTE — Evaluation (Signed)
Physical Therapy Evaluation Patient Details Name: Christina Lawson MRN: [id number] DOB: June 14, 1923 Today's Date: 12/11/2019   History of Present Illness  is a 86 y.o. female with medical history significant of paroxysmal atrial fibrillation, hypothyroidism, essential hypertension, osteoporosis who is a resident of [facility] was in her usual state of health when she woke up this morning, went to use the bathroom and then found herself on the floor with a laceration on the back of her scalp.After she woke up she had soreness in the back of her scalp and her left elbow. She denied any chest pain shortness of breath palpitations or dizziness  Clinical Impression  Pt agreeable to therapy; pt limited due to dizziness and abnormal vitals, MD and RN made aware; pt states she gets dizzy during change in positions; pt unable to perform OOB activities due to dizziness and vitals, will need follow up skilled PT to assess OOB activities; pt demonstrating deficits in balance, coordination, gait and safety, pt will benefit form skilled PT to address deficits to maximize independence with functional mobility prior to discharge. Pending progress with therapy will determine discharge plan and if equipment is needed    Follow Up Recommendations SNF;Supervision/Assistance - 24 hour;Other (comment) (SNF vs return to ALF pending progress wtih PT)    Equipment Recommendations  Other (comment) (will reassess wtih OOB activity)    Recommendations for Other Services       Precautions / Restrictions Precautions Precautions: None Restrictions Weight Bearing Restrictions: No      Mobility  Bed Mobility Overal bed mobility: Needs Assistance Bed Mobility: Supine to Sit     Supine to sit: Mod assist     General bed mobility comments: pt requiring mod A to come to long sit; unable to maintain or get to EOB due to dizziness, pt returned supine in bed; vitals monitored, MD made  aware  Transfers                    Ambulation/Gait                Stairs            Wheelchair Mobility    Modified Rankin (Stroke Patients Only)       Balance Overall balance assessment: Needs assistance Sitting-balance support: Bilateral upper extremity supported   Sitting balance - Comments: unable to fully assess due to dizziness, pt unable to come into a sitting position and maintain                                     Pertinent Vitals/Pain Pain Assessment: 0-10 Pain Score: 5  Pain Location: L neck and l elbow Pain Intervention(s): Monitored during session    Home Living Family/patient expects to be discharged to:: Assisted living               Home Equipment: Kasandra Knudsen - single point Additional Comments: pt states she is I in her room with all functional mobility tasks and IADLs, she is able to walk herself to the dining room wtihout any assistance or AD    Prior Function Level of Independence: Independent               Hand Dominance   Dominant Hand: Right    Extremity/Trunk Assessment        Lower Extremity Assessment Lower Extremity Assessment: Overall WFL for tasks assessed (pt at  least 3+/5; not formally assessed; unable to assess wtih functional mobility due to dizziness)       Communication   Communication: HOH  Cognition Arousal/Alertness: Awake/alert Behavior During Therapy: WFL for tasks assessed/performed Overall Cognitive Status: Within Functional Limits for tasks assessed                                        General Comments General comments (skin integrity, edema, etc.): pt dizzy with change in position from supine>long sit; therapist assessed head rotation R and L to assess if dizziness occured wtih head movement, not dizziness noted with lateral head movements    Exercises     Assessment/Plan    PT Assessment Patient needs continued PT services  PT Problem List  Decreased mobility;Decreased activity tolerance;Decreased balance;Decreased coordination       PT Treatment Interventions Therapeutic exercise;Gait training;Balance training;Functional mobility training;Therapeutic activities    PT Goals (Current goals can be found in the Care Plan section)  Acute Rehab PT Goals Patient Stated Goal: "to go home" PT Goal Formulation: With patient Time For Goal Achievement: 12/25/19 Potential to Achieve Goals: Good    Frequency Min 3X/week   Barriers to discharge        Co-evaluation               AM-PAC PT "6 Clicks" Mobility  Outcome Measure Help needed turning from your back to your side while in a flat bed without using bedrails?: A Little Help needed moving from lying on your back to sitting on the side of a flat bed without using bedrails?: A Lot Help needed moving to and from a bed to a chair (including a wheelchair)?: Total Help needed standing up from a chair using your arms (e.g., wheelchair or bedside chair)?: Total Help needed to walk in hospital room?: Total Help needed climbing 3-5 steps with a railing? : Total 6 Click Score: 9    End of Session   Activity Tolerance: Treatment limited secondary to medical complications (Comment) Patient left: in bed;with family/visitor present;with call bell/phone within reach Nurse Communication: Mobility status PT Visit Diagnosis: Unsteadiness on feet (R26.81);Other abnormalities of gait and mobility (R26.89);Dizziness and giddiness (R42);Difficulty in walking, not elsewhere classified (R26.2)    Time: 4782-9562 PT Time Calculation (min) (ACUTE ONLY): 15 min   Charges:   PT Evaluation $PT Eval Low Complexity: [place], DPT Acute Rehabilitation Services [phone]  [place] 12/11/2019, 12:58 PM

## 2019-12-11 NOTE — ED Notes (Signed)
Pt transported to ECHO. 

## 2019-12-11 NOTE — TOC Initial Note (Signed)
Transition of Care [facility]) - Initial/Assessment Note    Patient Details  Name: Christina Lawson MRN: [id number] Date of Birth: 10/31/1923  [facility] CM/SW Contact:    Trula Ore, [place] Phone Number: 12/11/2019, 4:38 PM  Clinical Narrative:                   CSW received consult for possible SNF placement at time of discharge.CSW comes from [facility]. CSW spoke with patient regarding PT recommendation of SNF placement at time of discharge. Patient expressed understanding of PT recommendation and is agreeable to SNF placement at time of discharge. Patient reports preference for [facility].Patient has received the COVID vaccines. No further questions reported at this time. CSW to continue to follow and assist with discharge planning needs.  Expected Discharge Plan: Skilled Nursing Facility Barriers to Discharge: Continued Medical Work up   Patient Goals and CMS Choice Patient states their goals for this hospitalization and ongoing recovery are:: to go to SNF CMS Medicare.gov Compare Post Acute Care list provided to:: Patient Choice offered to / list presented to : Patient  Expected Discharge Plan and Services Expected Discharge Plan: [place]       Living arrangements for the past 2 months: [place]                                      Prior Living Arrangements/Services Living arrangements for the past 2 months: [place] Lives with:: Self, Facility Resident Patient language and need for interpreter reviewed:: Yes Do you feel safe going back to the place where you live?: Yes      Need for Family Participation in Patient Care: Yes (Comment) Care giver support system in place?: Yes (comment)   Criminal Activity/Legal Involvement Pertinent to Current Situation/Hospitalization: No - Comment as needed  Activities of Daily Living Home Assistive Devices/Equipment: Eyeglasses ADL Screening  (condition at time of admission) Patient's cognitive ability adequate to safely complete daily activities?: Yes Is the patient deaf or have difficulty hearing?: Yes Does the patient have difficulty seeing, even when wearing glasses/contacts?: No Does the patient have difficulty concentrating, remembering, or making decisions?: No Patient able to express need for assistance with ADLs?: Yes Does the patient have difficulty dressing or bathing?: No Independently performs ADLs?: Yes (appropriate for developmental age) Does the patient have difficulty walking or climbing stairs?: Yes Weakness of Legs: Both Weakness of Arms/Hands: None  Permission Sought/Granted Permission sought to share information with : Case Manager, Family Supports, Chartered certified accountant granted to share information with : Yes, Verbal Permission Granted  Share Information with NAME: Diane  Permission granted to share info w AGENCY: SNF  Permission granted to share info w Relationship: Daughter  Permission granted to share info w Contact Information: Shauna Hugh  [phone]  Emotional Assessment Appearance:: Appears stated age Attitude/Demeanor/Rapport: Gracious Affect (typically observed): Calm Orientation: : Oriented to Self, Oriented to  Time, Oriented to Place, Oriented to Situation (WDL) Alcohol / Substance Use: Not Applicable Psych Involvement: No (comment)  Admission diagnosis:  Syncope and collapse [R55] Episodic lightheadedness [R42] Fall, initial encounter [W19.XXXA] Laceration of scalp, initial encounter [S01.01XA] Patient Active Problem List   Diagnosis Date Noted  . Syncope and collapse 12/10/2019  . AF (paroxysmal atrial fibrillation) ([place]) 12/10/2019   PCP:  Gayland Curry, DO Pharmacy:  No Pharmacies Listed    Social Determinants of  Health (SDOH) Interventions    Readmission Risk Interventions No flowsheet data found.

## 2019-12-11 NOTE — Discharge Instructions (Addendum)

## 2019-12-11 NOTE — Progress Notes (Signed)
PROGRESS NOTE    Christina Lawson  MPN:[id number] DOB: [birth year] DOA: 12/10/2019 PCP: Gayland Curry, DO  Brief Narrative: Christina Lawson is a 86 y.o. female with medical history significant of paroxysmal atrial fibrillation, hypothyroidism, essential hypertension, osteoporosis who is a resident of [facility] was in her usual state of health when she woke up 9/20 morning, went to use the bathroom and then found herself on the floor with a laceration on the back of her scalp. -After she woke up she had soreness in the back of her scalp and her left elbow -She denied any chest pain shortness of breath palpitations or dizziness ED Course: Work-up in the ED noted laceration on her posterior scalp and left elbow. -Overnight blood pressure in the 008-676 systolic range   Assessment & Plan:   Active Problems:   Syncope and collapse -Without prodrome, cardiac syncope suspected -EKG with Q waves in inferior leads which is unchanged from prior -Home dose of Toprol resumed -2D echo pending -TSH and troponins unremarkable -No focal neurological symptoms or history suggestive of seizures, CT head unremarkable -History of A. fib, followed by Dr.Acharya, will request cardiology input, may need event monitor -PT eval, became very dizzy when she tried to sit up with therapy today -Orthostatics were borderline positive yesterday and given gentle fluids since discontinued with elevated BP   Accelerated hypertension -Overnight and today blood pressure was significantly elevated in the 200 range, -Yesterday this was 129-175 -Continue Toprol, will not increase dose given heart rate -Add amlodipine and as needed hydralazine -Titrate, add additional meds slowly as tolerated given ongoing dizziness    PAF (paroxysmal atrial fibrillation) (HCC) -In sinus rhythm, continue Toprol and Eliquis  Scalp laceration Elbow skin tear -Status post multiple staples yesterday -Wound  care   DVT prophylaxis: Eliquis Code Status: DNR Family Communication: Daughter at bedside Disposition Plan:  Status is: Observation  The patient will require care spanning > 2 midnights and should be moved to inpatient because: Ongoing diagnostic testing needed not appropriate for outpatient work up, remains extremely dizzy with blood pressure in the 200s today, not medically stable for discharge, cardiology consulted   Dispo: The patient is from: ALF              Anticipated d/c is to: ALF              Anticipated d/c date is: 1-2 days pending improvement in dizziness, stabilization of blood pressure              Patient currently is not medically stable to d/c.  Consultants:   Cards   Procedures:   Antimicrobials:    Subjective: -Dizzy when she tries to sit up today  Objective: Vitals:   12/11/19 1100 12/11/19 1200 12/11/19 1251 12/11/19 1257  BP:  (!) 199/72 (!) 199/72 (!) 191/93  Pulse: 65 64    Resp: 18 18    Temp:      TempSrc:      SpO2: 96% 99%    Weight:      Height:        Intake/Output Summary (Last 24 hours) at 12/11/2019 1455 Last data filed at 12/11/2019 0254 Gross per 24 hour  Intake 1500 ml  Output --  Net 1500 ml   Filed Weights   12/10/19 1535  Weight: 54.4 kg    Examination:  General exam: Elderly pleasant female laying in bed, AAOx3, HEENT: Scalp laceration with staples Respiratory system: Clear Cardiovascular system: S1 &  S2 heard, RRR  Gastrointestinal system: Abdomen is nondistended, soft and nontender.Normal bowel sounds heard. Central nervous system: Moves all extremities, no localizing signs Extremities: No edema, left elbow skin tear Skin: As above  psychiatry: Judgement and insight appear normal. Mood & affect appropriate.     Data Reviewed:   CBC: Recent Labs  Lab 12/10/19 1900 12/11/19 0445  WBC 9.7 8.5  NEUTROABS 7.6  --   HGB 13.1 13.3  HCT 39.8 39.8  MCV 95.2 93.0  PLT 229 267   Basic Metabolic  Panel: Recent Labs  Lab 12/10/19 1203 12/10/19 1900 12/11/19 0445  NA 139  --  136  K 3.9  --  3.7  CL 105  --  102  CO2 25  --  24  GLUCOSE 123*  --  108*  BUN 18  --  12  CREATININE 0.86 0.72 0.72  CALCIUM 9.4  --  9.1   GFR: Estimated Creatinine Clearance: 34 mL/min (by C-G formula based on SCr of 0.72 mg/dL). Liver Function Tests: Recent Labs  Lab 12/10/19 1203 12/11/19 0445  AST 23 27  ALT 16 19  ALKPHOS 47 54  BILITOT 0.9 1.3*  PROT 6.7 6.5  ALBUMIN 3.5 3.3*   No results for input(s): LIPASE, AMYLASE in the last 168 hours. No results for input(s): AMMONIA in the last 168 hours. Coagulation Profile: No results for input(s): INR, PROTIME in the last 168 hours. Cardiac Enzymes: No results for input(s): CKTOTAL, CKMB, CKMBINDEX, TROPONINI in the last 168 hours. BNP (last 3 results) No results for input(s): PROBNP in the last 8760 hours. HbA1C: No results for input(s): HGBA1C in the last 72 hours. CBG: No results for input(s): GLUCAP in the last 168 hours. Lipid Profile: No results for input(s): CHOL, HDL, LDLCALC, TRIG, CHOLHDL, LDLDIRECT in the last 72 hours. Thyroid Function Tests: Recent Labs    12/10/19 1203  TSH 3.945   Anemia Panel: No results for input(s): VITAMINB12, FOLATE, FERRITIN, TIBC, IRON, RETICCTPCT in the last 72 hours. Urine analysis: No results found for: COLORURINE, APPEARANCEUR, LABSPEC, PHURINE, GLUCOSEU, HGBUR, BILIRUBINUR, KETONESUR, PROTEINUR, UROBILINOGEN, NITRITE, LEUKOCYTESUR Sepsis Labs: @LABRCNTIP (procalcitonin:4,lacticidven:4)  ) Recent Results (from the past 240 hour(s))  Respiratory Panel by RT PCR (Flu A&B, Covid) - Nasopharyngeal Swab     Status: None   Collection Time: 12/10/19  3:30 PM   Specimen: Nasopharyngeal Swab  Result Value Ref Range Status   SARS Coronavirus 2 by RT PCR NEGATIVE NEGATIVE Final    Comment: (NOTE) SARS-CoV-2 target nucleic acids are NOT DETECTED.  The SARS-CoV-2 RNA is generally detectable  in upper respiratoy specimens during the acute phase of infection. The lowest concentration of SARS-CoV-2 viral copies this assay can detect is 131 copies/mL. A negative result does not preclude SARS-Cov-2 infection and should not be used as the sole basis for treatment or other patient management decisions. A negative result may occur with  improper specimen collection/handling, submission of specimen other than nasopharyngeal swab, presence of viral mutation(s) within the areas targeted by this assay, and inadequate number of viral copies (<131 copies/mL). A negative result must be combined with clinical observations, patient history, and epidemiological information. The expected result is Negative.  Fact Sheet for Patients:  PinkCheek.be  Fact Sheet for Healthcare Providers:  GravelBags.it  This test is no t yet approved or cleared by the [place] FDA and  has been authorized for detection and/or diagnosis of SARS-CoV-2 by FDA under an Emergency Use Authorization (EUA). This EUA will remain  in effect (meaning this test can be used) for the duration of the COVID-19 declaration under Section 564(b)(1) of the Act, 21 U.S.C. section 360bbb-3(b)(1), unless the authorization is terminated or revoked sooner.     Influenza A by PCR NEGATIVE NEGATIVE Final   Influenza B by PCR NEGATIVE NEGATIVE Final    Comment: (NOTE) The Xpert Xpress SARS-CoV-2/FLU/RSV assay is intended as an aid in  the diagnosis of influenza from Nasopharyngeal swab specimens and  should not be used as a sole basis for treatment. Nasal washings and  aspirates are unacceptable for Xpert Xpress SARS-CoV-2/FLU/RSV  testing.  Fact Sheet for Patients: PinkCheek.be  Fact Sheet for Healthcare Providers: GravelBags.it  This test is not yet approved or cleared by the [place] FDA and  has been  authorized for detection and/or diagnosis of SARS-CoV-2 by  FDA under an Emergency Use Authorization (EUA). This EUA will remain  in effect (meaning this test can be used) for the duration of the  Covid-19 declaration under Section 564(b)(1) of the Act, 21  U.S.C. section 360bbb-3(b)(1), unless the authorization is  terminated or revoked. Performed at [facility], [place] [address], [place],  [address]          Radiology Studies: DG Elbow Complete Left  Result Date: 12/10/2019 CLINICAL DATA:  Pain following fall EXAM: LEFT ELBOW - COMPLETE 3+ VIEW COMPARISON:  None. FINDINGS: Frontal, lateral, and bilateral oblique views were obtained. No fracture or dislocation. No joint effusion. Joint spaces appear normal. No erosive change. IMPRESSION: No fracture or dislocation.  No evident arthropathy. Electronically Signed   By: Lowella Grip III M.D.   On: 12/10/2019 13:10   CT Head Wo Contrast  Result Date: 12/10/2019 CLINICAL DATA:  Head and neck trauma EXAM: CT HEAD WITHOUT CONTRAST CT CERVICAL SPINE WITHOUT CONTRAST TECHNIQUE: Multidetector CT imaging of the head and cervical spine was performed following the standard protocol without intravenous contrast. Multiplanar CT image reconstructions of the cervical spine were also generated. COMPARISON:  09/18/2017 CT head FINDINGS: CT HEAD FINDINGS Brain: Atrophy pattern and advanced white matter microvascular ischemic changes throughout both cerebral hemispheres. No acute intracranial hemorrhage, definite new infarction, large mass lesion, midline shift, herniation, hydrocephalus, or extra-axial fluid collection. No focal mass effect or edema. Cisterns are patent. No gross cerebellar abnormality. Vascular: No hyperdense vessel or unexpected calcification. Skull: Normal. Negative for fracture or focal lesion. Sinuses/Orbits: No orbital abnormality. Sinuses clear. Chronic right mastoid effusion. Other: None. CT CERVICAL SPINE FINDINGS  Alignment: Normal. Skull base and vertebrae: No acute fracture. No primary bone lesion or focal pathologic process. Soft tissues and spinal canal: No prevertebral fluid or swelling. No visible canal hematoma. Disc levels: Advanced cervical degenerative changes spanning C3-C7. Ankylosis at C3-4 and C4-5 appearing chronic. C5-6 and C6-7 demonstrate marked disc space narrowing, sclerosis and osteophytes. Degenerative changes of the C1-2 articulation as well. Multilevel facet arthropathy.  No subluxation or dislocation. Upper chest: Visualized lung apices are clear. Aorta atherosclerotic. Other: None. IMPRESSION: Atrophy and advanced chronic white matter microvascular changes. No acute intracranial abnormality by noncontrast CT. Advanced cervical degenerative changes spanning C3-C7. No acute osseous finding, fracture, malalignment by CT. Electronically Signed   By: Jerilynn Mages.  Shick M.D.   On: 12/10/2019 13:26   CT Cervical Spine Wo Contrast  Result Date: 12/10/2019 CLINICAL DATA:  Head and neck trauma EXAM: CT HEAD WITHOUT CONTRAST CT CERVICAL SPINE WITHOUT CONTRAST TECHNIQUE: Multidetector CT imaging of the head and cervical spine was performed following the standard  protocol without intravenous contrast. Multiplanar CT image reconstructions of the cervical spine were also generated. COMPARISON:  09/18/2017 CT head FINDINGS: CT HEAD FINDINGS Brain: Atrophy pattern and advanced white matter microvascular ischemic changes throughout both cerebral hemispheres. No acute intracranial hemorrhage, definite new infarction, large mass lesion, midline shift, herniation, hydrocephalus, or extra-axial fluid collection. No focal mass effect or edema. Cisterns are patent. No gross cerebellar abnormality. Vascular: No hyperdense vessel or unexpected calcification. Skull: Normal. Negative for fracture or focal lesion. Sinuses/Orbits: No orbital abnormality. Sinuses clear. Chronic right mastoid effusion. Other: None. CT CERVICAL SPINE  FINDINGS Alignment: Normal. Skull base and vertebrae: No acute fracture. No primary bone lesion or focal pathologic process. Soft tissues and spinal canal: No prevertebral fluid or swelling. No visible canal hematoma. Disc levels: Advanced cervical degenerative changes spanning C3-C7. Ankylosis at C3-4 and C4-5 appearing chronic. C5-6 and C6-7 demonstrate marked disc space narrowing, sclerosis and osteophytes. Degenerative changes of the C1-2 articulation as well. Multilevel facet arthropathy.  No subluxation or dislocation. Upper chest: Visualized lung apices are clear. Aorta atherosclerotic. Other: None. IMPRESSION: Atrophy and advanced chronic white matter microvascular changes. No acute intracranial abnormality by noncontrast CT. Advanced cervical degenerative changes spanning C3-C7. No acute osseous finding, fracture, malalignment by CT. Electronically Signed   By: Jerilynn Mages.  Shick M.D.   On: 12/10/2019 13:26        Scheduled Meds: . amLODipine  5 mg Oral Daily  . apixaban  2.5 mg Oral BID  . [START ON 12/12/2019] metoprolol succinate  12.5 mg Oral Daily  . sertraline  50 mg Oral Daily   Continuous Infusions:   LOS: 0 days    Time spent: 36min  Domenic Polite, MD [facility] 12/11/2019, 2:55 PM

## 2019-12-11 NOTE — NC FL2 (Signed)
  [place] LEVEL OF CARE SCREENING TOOL     IDENTIFICATION  Patient Name: Christina Lawson Birthdate: August 25, 1923 Sex: female Admission Date (Current Location): 12/10/2019  [facility] and [place] Number:  Herbalist and Address:  [facility] [place] [address], [place], [place] [address]      Provider Number: [id number]  Attending Physician Name and Address:  Domenic Polite, MD  Relative Name and Phone Number:       Current Level of Care: Hospital Recommended Level of Care: [place] Prior Approval Number:    Date Approved/Denied:   PASRR Number: [id number]  Discharge Plan: SNF    Current Diagnoses: Patient Active Problem List   Diagnosis Date Noted  . Syncope and collapse 12/10/2019  . AF (paroxysmal atrial fibrillation) ([place]) 12/10/2019    Orientation RESPIRATION BLADDER Height & Weight     Self, Situation, Time, Place (within defined limits)  Normal Incontinent Weight: 120 lb (54.4 kg) Height:  5\' 2"  (157.5 cm)  BEHAVIORAL SYMPTOMS/MOOD NEUROLOGICAL BOWEL NUTRITION STATUS      Continent Diet (see discharge summary)  AMBULATORY STATUS COMMUNICATION OF NEEDS Skin   Extensive Assist Verbally Other (Comment), Normal (dry)                       Personal Care Assistance Level of Assistance  Bathing, Feeding, Dressing Bathing Assistance: Maximum assistance Feeding assistance: Independent Dressing Assistance: Maximum assistance     Functional Limitations Info  Sight, Hearing, Speech Sight Info: Impaired Hearing Info: Impaired Speech Info: Adequate    SPECIAL CARE FACTORS FREQUENCY  PT (By licensed PT), OT (By licensed OT)     PT Frequency: 5x week OT Frequency: 5x week            Contractures Contractures Info: Not present    Additional Factors Info  Code Status, Allergies, Psychotropic Code Status Info: DNR Allergies Info: Clindamycin/lincomycin, Grass Pollen([id number]-o-[id number] Vern),  Ibuprofen, Other, Penicillins Psychotropic Info: sertraline (ZOLOFT) tablet 50 mg daily PO         Current Medications (12/11/2019):  This is the current hospital active medication list Current Facility-Administered Medications  Medication Dose Route Frequency Provider Last Rate Last Admin  . acetaminophen (TYLENOL) tablet 650 mg  650 mg Oral Q6H PRN Domenic Polite, MD   650 mg at 12/10/19 2223  . amLODipine (NORVASC) tablet 5 mg  5 mg Oral Daily Domenic Polite, MD   5 mg at 12/11/19 1257  . apixaban (ELIQUIS) tablet 2.5 mg  2.5 mg Oral BID Domenic Polite, MD   2.5 mg at 12/11/19 1257  . hydrALAZINE (APRESOLINE) injection 5 mg  5 mg Intravenous Q6H PRN Domenic Polite, MD      . Derrill Memo ON 12/12/2019] metoprolol succinate (TOPROL-XL) 24 hr tablet 12.5 mg  12.5 mg Oral Daily Domenic Polite, MD      . sertraline (ZOLOFT) tablet 50 mg  50 mg Oral Daily Domenic Polite, MD   50 mg at 12/11/19 1258     Discharge Medications: Please see discharge summary for a list of discharge medications.  Relevant Imaging Results:  Relevant Lab Results:   Additional Information SS#[id number]; vaccinated  Alexander Mt, LCSW

## 2019-12-11 NOTE — Progress Notes (Signed)
  Echocardiogram 2D Echocardiogram has been performed.  Johny Chess 12/11/2019, 11:54 AM

## 2019-12-11 NOTE — Consult Note (Signed)
Cardiology Consultation:   Patient ID: Christina Lawson MRN: [id number]; DOB: 1923-10-29  Admit date: 12/10/2019 Date of Consult: 12/11/2019  Primary Care Provider: Gayland Curry, [facility] Cardiologist: Dr. Margaretann Loveless    Patient Profile:   Christina Lawson is a 86 y.o. female with a hx of paroxysmal atrial fibrillation, TIA, prior syncope, hypothyroidism, tremor and hyperlipidemia who is being seen today for the evaluation of syncope at the request of Dr. Broadus John.  Establish care with Dr. Margaretann Loveless January 2020 after syncope episode.  She was diagnosed with atrial fibrillation March 2020.  Echocardiogram with preserved LV function and mild to moderate MR.  Last seen by Dr. Margaretann Loveless July 2021.  History of Present Illness:   Christina Lawson lives at [facility] independent [facility] facility.  Patient was in the bathroom getting ready to get shower yesterday and suddenly felt lightheaded and passed out.  Unknown duration.  When she woke up she noted vomited.  Laceration 1 back of her scalp.  She was brought to ER and admitted for further evaluation.  High-sensitivity troponin negative x2.  Serum creatinine normal.  Hemoglobin stable.  TSH normal. Respiratory panel negative for Covid and influenza.  Systolic blood pressure running high.  Patient denies chest pain, shortness of breath, orthopnea, PND, lower extremity edema or melena.    No past medical history on file.  As summarized above.  Reviewed other chart.  Pending monitor.   Inpatient Medications: Scheduled Meds: . amLODipine  5 mg Oral Daily  . apixaban  2.5 mg Oral BID  . [START ON 12/12/2019] metoprolol succinate  12.5 mg Oral Daily  . sertraline  50 mg Oral Daily   Continuous Infusions:  PRN Meds: acetaminophen, hydrALAZINE  Allergies:    Allergies  Allergen Reactions  . Clindamycin/Lincomycin     Listed on MAR -- reaction unknown  . Grass Pollen(K-O-R-T-Swt Vern)     Listed on MAR -- reaction unknown  .  Ibuprofen     Listed on MAR -- reaction unknown  . Other     Preparation H Listed on MAR -- reaction unknown  . Penicillins     Listed on MAR -- reaction unknown    Social History:   Social History   Socioeconomic History  . Marital status: Single    Spouse name: Not on file  . Number of children: Not on file  . Years of education: Not on file  . Highest education level: Not on file  Occupational History  . Not on file  Tobacco Use  . Smoking status: Not on file  Substance and Sexual Activity  . Alcohol use: Not on file  . Drug use: Not on file  . Sexual activity: Not on file  Other Topics Concern  . Not on file  Social History Narrative  . Not on file   Social Determinants of Health   Financial Resource Strain:   . Difficulty of Paying Living Expenses: Not on file  Food Insecurity:   . Worried About Charity fundraiser in the Last Year: Not on file  . Ran Out of Food in the Last Year: Not on file  Transportation Needs:   . Lack of Transportation (Medical): Not on file  . Lack of Transportation (Non-Medical): Not on file  Physical Activity:   . Days of Exercise per Week: Not on file  . Minutes of Exercise per Session: Not on file  Stress:   . Feeling of Stress : Not on file  Social Connections:   .  Frequency of Communication with Friends and Family: Not on file  . Frequency of Social Gatherings with Friends and Family: Not on file  . Attends Religious Services: Not on file  . Active Member of Clubs or Organizations: Not on file  . Attends Archivist Meetings: Not on file  . Marital Status: Not on file  Intimate Partner Violence:   . Fear of Current or Ex-Partner: Not on file  . Emotionally Abused: Not on file  . Physically Abused: Not on file  . Sexually Abused: Not on file    Family History:   Pending merger of chart  ROS:  Please see the history of present illness.  All other ROS reviewed and negative.     Physical Exam/Data:   Vitals:     12/11/19 1100 12/11/19 1200 12/11/19 1251 12/11/19 1257  BP:  (!) 199/72 (!) 199/72 (!) 191/93  Pulse: 65 64    Resp: 18 18    Temp:      TempSrc:      SpO2: 96% 99%    Weight:      Height:        Intake/Output Summary (Last 24 hours) at 12/11/2019 1504 Last data filed at 12/11/2019 0254 Gross per 24 hour  Intake 1500 ml  Output --  Net 1500 ml   Last 3 Weights 12/10/2019  Weight (lbs) 120 lb  Weight (kg) 54.432 kg     Body mass index is 21.95 kg/m.  General: Thin frail elderly female in no acute distress HEENT: Scalp laceration Lymph: no adenopathy Neck: no JVD Endocrine:  No thryomegaly Vascular: No carotid bruits; FA pulses 2+ bilaterally without bruits  Cardiac:  normal S1, S2; RRR; no murmur Lungs:  clear to auscultation bilaterally, no wheezing, rhonchi or rales  Abd: soft, nontender, no hepatomegaly  Ext: no edema Musculoskeletal:  No deformities, BUE and BLE strength normal and equal Skin: warm and dry  Neuro:  CNs 2-12 intact, no focal abnormalities noted Psych:  Normal affect   EKG:  The EKG was personally reviewed and demonstrates: Sinus rhythm at rate of 64 bpm Telemetry:  Telemetry was personally reviewed and demonstrates: Sinus rhythm at rate of 60s  Relevant CV Studies:  Echo 05/2018 1. The left ventricle has normal systolic function, with an ejection  fraction of 55-60%. The cavity size was mildly decreased. Left ventricular  diastolic Doppler parameters are consistent with impaired relaxation The  E/e' is 26.6. No evidence of left  ventricular regional wall motion abnormalities.  2. No evidence of left ventricular regional wall motion abnormalities.  3. The right ventricle has normal systolic function. The cavity was  normal. There is no increase in right ventricular wall thickness.  4. The mitral valve is normal in structure. Mild calcification of the  mitral valve leaflet. There is moderate mitral annular calcification  present. Mitral valve  regurgitation is mild to moderate by color flow  Doppler.  5. The tricuspid valve is normal in structure.  6. The aortic valve is tricuspid Mild calcification of the aortic valve.  7. The pulmonic valve was normal in structure.   Laboratory Data:  High Sensitivity Troponin:   Recent Labs  Lab 12/10/19 1203 12/10/19 1900  TROPONINIHS 4 4     Chemistry Recent Labs  Lab 12/10/19 1203 12/10/19 1900 12/11/19 0445  NA 139  --  136  K 3.9  --  3.7  CL 105  --  102  CO2 25  --  24  GLUCOSE 123*  --  108*  BUN 18  --  12  CREATININE 0.86 0.72 0.72  CALCIUM 9.4  --  9.1  GFRNONAA 58* >60 >60  GFRAA >60 >60 >60  ANIONGAP 9  --  10    Recent Labs  Lab 12/10/19 1203 12/11/19 0445  PROT 6.7 6.5  ALBUMIN 3.5 3.3*  AST 23 27  ALT 16 19  ALKPHOS 47 54  BILITOT 0.9 1.3*   Hematology Recent Labs  Lab 12/10/19 1900 12/11/19 0445  WBC 9.7 8.5  RBC 4.18 4.28  HGB 13.1 13.3  HCT 39.8 39.8  MCV 95.2 93.0  MCH 31.3 31.1  MCHC 32.9 33.4  RDW 14.1 13.9  PLT 229 211    Radiology/Studies:  DG Elbow Complete Left  Result Date: 12/10/2019 CLINICAL DATA:  Pain following fall EXAM: LEFT ELBOW - COMPLETE 3+ VIEW COMPARISON:  None. FINDINGS: Frontal, lateral, and bilateral oblique views were obtained. No fracture or dislocation. No joint effusion. Joint spaces appear normal. No erosive change. IMPRESSION: No fracture or dislocation.  No evident arthropathy. Electronically Signed   By: Lowella Grip III M.D.   On: 12/10/2019 13:10   CT Head Wo Contrast  Result Date: 12/10/2019 CLINICAL DATA:  Head and neck trauma EXAM: CT HEAD WITHOUT CONTRAST CT CERVICAL SPINE WITHOUT CONTRAST TECHNIQUE: Multidetector CT imaging of the head and cervical spine was performed following the standard protocol without intravenous contrast. Multiplanar CT image reconstructions of the cervical spine were also generated. COMPARISON:  09/18/2017 CT head FINDINGS: CT HEAD FINDINGS Brain: Atrophy pattern  and advanced white matter microvascular ischemic changes throughout both cerebral hemispheres. No acute intracranial hemorrhage, definite new infarction, large mass lesion, midline shift, herniation, hydrocephalus, or extra-axial fluid collection. No focal mass effect or edema. Cisterns are patent. No gross cerebellar abnormality. Vascular: No hyperdense vessel or unexpected calcification. Skull: Normal. Negative for fracture or focal lesion. Sinuses/Orbits: No orbital abnormality. Sinuses clear. Chronic right mastoid effusion. Other: None. CT CERVICAL SPINE FINDINGS Alignment: Normal. Skull base and vertebrae: No acute fracture. No primary bone lesion or focal pathologic process. Soft tissues and spinal canal: No prevertebral fluid or swelling. No visible canal hematoma. Disc levels: Advanced cervical degenerative changes spanning C3-C7. Ankylosis at C3-4 and C4-5 appearing chronic. C5-6 and C6-7 demonstrate marked disc space narrowing, sclerosis and osteophytes. Degenerative changes of the C1-2 articulation as well. Multilevel facet arthropathy.  No subluxation or dislocation. Upper chest: Visualized lung apices are clear. Aorta atherosclerotic. Other: None. IMPRESSION: Atrophy and advanced chronic white matter microvascular changes. No acute intracranial abnormality by noncontrast CT. Advanced cervical degenerative changes spanning C3-C7. No acute osseous finding, fracture, malalignment by CT. Electronically Signed   By: Jerilynn Mages.  Shick M.D.   On: 12/10/2019 13:26   CT Cervical Spine Wo Contrast  Result Date: 12/10/2019 CLINICAL DATA:  Head and neck trauma EXAM: CT HEAD WITHOUT CONTRAST CT CERVICAL SPINE WITHOUT CONTRAST TECHNIQUE: Multidetector CT imaging of the head and cervical spine was performed following the standard protocol without intravenous contrast. Multiplanar CT image reconstructions of the cervical spine were also generated. COMPARISON:  09/18/2017 CT head FINDINGS: CT HEAD FINDINGS Brain: Atrophy  pattern and advanced white matter microvascular ischemic changes throughout both cerebral hemispheres. No acute intracranial hemorrhage, definite new infarction, large mass lesion, midline shift, herniation, hydrocephalus, or extra-axial fluid collection. No focal mass effect or edema. Cisterns are patent. No gross cerebellar abnormality. Vascular: No hyperdense vessel or unexpected calcification. Skull: Normal. Negative for fracture or focal lesion. Sinuses/Orbits:  No orbital abnormality. Sinuses clear. Chronic right mastoid effusion. Other: None. CT CERVICAL SPINE FINDINGS Alignment: Normal. Skull base and vertebrae: No acute fracture. No primary bone lesion or focal pathologic process. Soft tissues and spinal canal: No prevertebral fluid or swelling. No visible canal hematoma. Disc levels: Advanced cervical degenerative changes spanning C3-C7. Ankylosis at C3-4 and C4-5 appearing chronic. C5-6 and C6-7 demonstrate marked disc space narrowing, sclerosis and osteophytes. Degenerative changes of the C1-2 articulation as well. Multilevel facet arthropathy.  No subluxation or dislocation. Upper chest: Visualized lung apices are clear. Aorta atherosclerotic. Other: None. IMPRESSION: Atrophy and advanced chronic white matter microvascular changes. No acute intracranial abnormality by noncontrast CT. Advanced cervical degenerative changes spanning C3-C7. No acute osseous finding, fracture, malalignment by CT. Electronically Signed   By: Jerilynn Mages.  Shick M.D.   On: 12/10/2019 13:26   {  Assessment and Plan:   1. Syncope Patient felt lightheaded prior to syncope.  Otherwise, no prodromal symptoms.  She denies chest pain, shortness of breath or palpitation.  Troponin negative.  EKG without acute ischemic changes.  Echocardiogram done pending reading.  Telemetry shows sinus rhythm at rate of 60s.  2.  Paroxysmal atrial fibrillation -Maintaining sinus rhythm at rate of 50-60s on Home Toprol-XL 12.5 mg qd -On Eliquis2.5mg   BID for anticoagulation - CHADSVASCs score of 6 for age, sex, HTN and TIA   3.  Accelerated hypertension -Blood pressure was normal on arrival yesterday however since then it has been running high, lately systolically in [phone] - Just got Norvasc 5mg  >> up titrate as needed. PRN hydralazine has been written    Dr. Radford Pax to see later today   For questions or updates, please contact [facility] Please consult [email or web address] for contact info under    Jarrett Soho, PA  12/11/2019 3:04 PM

## 2019-12-11 NOTE — ED Notes (Signed)
Eliquis asn zoloft not given by previous nurse, not avail from main pharm. Still not avail at this time prior to transport

## 2019-12-12 ENCOUNTER — Telehealth: Payer: Self-pay | Admitting: Internal Medicine

## 2019-12-12 ENCOUNTER — Inpatient Hospital Stay (HOSPITAL_COMMUNITY): Payer: Medicare Other

## 2019-12-12 DIAGNOSIS — R55 Syncope and collapse: Secondary | ICD-10-CM

## 2019-12-12 NOTE — Progress Notes (Addendum)
Patient very orthostatic today when going from sitting to standing and gets symptomatic with dizziness.  Will place thigh high compression hose and abdominal binder and repeat orthostatics in am.  Would not be a good candidate for FLorinef or midodrine with resting HTN.  May need to consider mestinon if she remains orthostatic.  Await results of carotid dopplers.   Appreciate note by patient's primary Cardiologist Dr. Margaretann Loveless.  Will continue Eliquis for afib for now.  EP consult on hold for now.  Would consider ILR placement in the future if she continues to have syncope.

## 2019-12-12 NOTE — Telephone Encounter (Signed)
I called Christina Lawson in her room today, 6E20, and discussed DOAC and implantable loop recorder.   We discussed pros and cons of continuing eliquis. She would like to defer this decision to the outpatient and "see how she does at home". Given history of TIA which prompted initiation of anticoagultion, this is reasonable. We discussed risk of continuing DOAC as well in setting of unexplained syncope. She plans to take it easy at home.   We discussed ILR. I think this would be a reasonable pursuit. She is not strongly inclined to have a pacemaker. I told her to have a thorough discussion with EP, but if she chooses not to have loop recorder as well, that is her decision.   Awaiting further discussions with hospital team. I have communicated these findings to Rosaria Ferries PA on consulting CV team.   I would be happy to see her in close follow up upon discharge.

## 2019-12-12 NOTE — Progress Notes (Signed)
VASCULAR LAB    Carotid duplex has been performed.  See CV proc for preliminary results.   Jataya Wann, RVT 12/12/2019, 11:52 AM

## 2019-12-12 NOTE — Progress Notes (Addendum)
PROGRESS NOTE    Christina Lawson DOB: [birth year] DOA: 12/10/2019 PCP: Gayland Curry, DO  Brief Narrative: Christina Lawson is a 86 y.o. female with medical history significant of paroxysmal atrial fibrillation, hypothyroidism, essential hypertension, osteoporosis who is a resident of [facility] was in her usual state of health when she woke up 9/20 morning, went to use the bathroom and then found herself on the floor with a laceration on the back of her scalp. -After she woke up she had soreness in the back of her scalp and her left elbow -She denied any chest pain shortness of breath palpitations or dizziness ED Course: Work-up in the ED noted laceration on her posterior scalp and left elbow. -Overnight blood pressure in the 494-496 systolic range   Assessment & Plan:     Syncope and collapse Orthostatic hypotension Suspected autonomic neuropathy -EKG with Q waves in inferior leads which is unchanged from prior -2D echo unremarkable, TSH and troponins negative -Orthostatics significantly positive, supine blood pressure in the 190s and standing blood pressure in the 120s, no history suggestive of dehydration, given IV fluids for 12 hours after admission, supine blood pressure remains considerably elevated however have avoided aggressive titration given much lower standing blood pressures and dizziness -Add TED hose, advised importance of staying well-hydrated and caution with position changes, physical therapy following, hopefully will be able to get out of bed and ambulate today, educated on importance of using a walker   Accelerated hypertension Orthostatic hypotension -See discussion above, continue Toprol and started on low-dose amlodipine -Avoid excessive titration of supine BP given positive orthostasis    PAF (paroxysmal atrial fibrillation) (HCC) -In sinus rhythm, continue Toprol and Eliquis  Scalp laceration Elbow skin  tear -Status post multiple staples  -Wound care   DVT prophylaxis: Eliquis Code Status: DNR Family Communication: Daughter at bedside Disposition Plan:  Status is: Inpatient Due to severity of illness   Dispo: The patient is from: ALF              Anticipated d/c is to: ALF              Anticipated d/c date is: 1-2 days pending improvement in dizziness, stabilization of blood pressure              Patient currently is not medically stable to d/c.  Consultants:   Cards   Procedures:   Antimicrobials:    Subjective: -Dizzy when she tries to sit up today  Objective: Vitals:   12/12/19 0015 12/12/19 0435 12/12/19 0830 12/12/19 0935  BP: (!) 192/74 (!) 173/73  (!) 194/81  Pulse: 67 69  67  Resp: 18 20    Temp: 98.5 F (36.9 C) 97.6 F (36.4 C) 97.6 F (36.4 C)   TempSrc: Oral Oral Oral   SpO2: 97% 99%    Weight:  59.1 kg    Height:        Intake/Output Summary (Last 24 hours) at 12/12/2019 1525 Last data filed at 12/12/2019 1416 Gross per 24 hour  Intake 120 ml  Output 650 ml  Net -530 ml   Filed Weights   12/10/19 1535 12/12/19 0435  Weight: 54.4 kg 59.1 kg    Examination:  General exam: Elderly frail chronically ill female laying in bed, awake alert oriented x3, no distress HEENT: Scalp laceration with staples CVS: S1-S2, regular rate rhythm Lungs: Clear bilaterally Abdomen: Soft, nontender, bowel sounds present  extremities: No edema, left elbow skin tear Skin:  As above  psychiatry: Judgement and insight appear normal. Mood & affect appropriate.     Data Reviewed:   CBC: Recent Labs  Lab 12/10/19 1900 12/11/19 0445  WBC 9.7 8.5  NEUTROABS 7.6  --   HGB 13.1 13.3  HCT 39.8 39.8  MCV 95.2 93.0  PLT 229 465   Basic Metabolic Panel: Recent Labs  Lab 12/10/19 1203 12/10/19 1900 12/11/19 0445  NA 139  --  136  K 3.9  --  3.7  CL 105  --  102  CO2 25  --  24  GLUCOSE 123*  --  108*  BUN 18  --  12  CREATININE 0.86 0.72 0.72   CALCIUM 9.4  --  9.1   GFR: Estimated Creatinine Clearance: 34 mL/min (by C-G formula based on SCr of 0.72 mg/dL). Liver Function Tests: Recent Labs  Lab 12/10/19 1203 12/11/19 0445  AST 23 27  ALT 16 19  ALKPHOS 47 54  BILITOT 0.9 1.3*  PROT 6.7 6.5  ALBUMIN 3.5 3.3*   No results for input(s): LIPASE, AMYLASE in the last 168 hours. No results for input(s): AMMONIA in the last 168 hours. Coagulation Profile: No results for input(s): INR, PROTIME in the last 168 hours. Cardiac Enzymes: No results for input(s): CKTOTAL, CKMB, CKMBINDEX, TROPONINI in the last 168 hours. BNP (last 3 results) No results for input(s): PROBNP in the last 8760 hours. HbA1C: No results for input(s): HGBA1C in the last 72 hours. CBG: No results for input(s): GLUCAP in the last 168 hours. Lipid Profile: No results for input(s): CHOL, HDL, LDLCALC, TRIG, CHOLHDL, LDLDIRECT in the last 72 hours. Thyroid Function Tests: Recent Labs    12/10/19 1203  TSH 3.945   Anemia Panel: No results for input(s): VITAMINB12, FOLATE, FERRITIN, TIBC, IRON, RETICCTPCT in the last 72 hours. Urine analysis: No results found for: COLORURINE, APPEARANCEUR, LABSPEC, PHURINE, GLUCOSEU, HGBUR, BILIRUBINUR, KETONESUR, PROTEINUR, UROBILINOGEN, NITRITE, LEUKOCYTESUR Sepsis Labs: @LABRCNTIP (procalcitonin:4,lacticidven:4)  ) Recent Results (from the past 240 hour(s))  Respiratory Panel by RT PCR (Flu A&B, Covid) - Nasopharyngeal Swab     Status: None   Collection Time: 12/10/19  3:30 PM   Specimen: Nasopharyngeal Swab  Result Value Ref Range Status   SARS Coronavirus 2 by RT PCR NEGATIVE NEGATIVE Final    Comment: (NOTE) SARS-CoV-2 target nucleic acids are NOT DETECTED.  The SARS-CoV-2 RNA is generally detectable in upper respiratoy specimens during the acute phase of infection. The lowest concentration of SARS-CoV-2 viral copies this assay can detect is 131 copies/mL. A negative result does not preclude  SARS-Cov-2 infection and should not be used as the sole basis for treatment or other patient management decisions. A negative result may occur with  improper specimen collection/handling, submission of specimen other than nasopharyngeal swab, presence of viral mutation(s) within the areas targeted by this assay, and inadequate number of viral copies (<131 copies/mL). A negative result must be combined with clinical observations, patient history, and epidemiological information. The expected result is Negative.  Fact Sheet for Patients:  PinkCheek.be  Fact Sheet for Healthcare Providers:  GravelBags.it  This test is no t yet approved or cleared by the [place] FDA and  has been authorized for detection and/or diagnosis of SARS-CoV-2 by FDA under an Emergency Use Authorization (EUA). This EUA will remain  in effect (meaning this test can be used) for the duration of the COVID-19 declaration under Section 564(b)(1) of the Act, 21 U.S.C. section 360bbb-3(b)(1), unless the authorization is terminated or  revoked sooner.     Influenza A by PCR NEGATIVE NEGATIVE Final   Influenza B by PCR NEGATIVE NEGATIVE Final    Comment: (NOTE) The Xpert Xpress SARS-CoV-2/FLU/RSV assay is intended as an aid in  the diagnosis of influenza from Nasopharyngeal swab specimens and  should not be used as a sole basis for treatment. Nasal washings and  aspirates are unacceptable for Xpert Xpress SARS-CoV-2/FLU/RSV  testing.  Fact Sheet for Patients: PinkCheek.be  Fact Sheet for Healthcare Providers: GravelBags.it  This test is not yet approved or cleared by the [place] FDA and  has been authorized for detection and/or diagnosis of SARS-CoV-2 by  FDA under an Emergency Use Authorization (EUA). This EUA will remain  in effect (meaning this test can be used) for the duration of the   Covid-19 declaration under Section 564(b)(1) of the Act, 21  U.S.C. section 360bbb-3(b)(1), unless the authorization is  terminated or revoked. Performed at [facility], [place] [address], [place], [place] [address]          Radiology Studies: ECHOCARDIOGRAM COMPLETE  Result Date: 12/11/2019    ECHOCARDIOGRAM REPORT   Patient Name:   SHYLO ZAMOR Date of Exam: 12/11/2019 Medical Rec #:  [id number]          Height:       62.0 in Accession #:    [id number]         Weight:       120.0 lb Date of Birth:  1923/10/25           BSA:          1.539 m Patient Age:    77 years           BP:           131/107 mmHg Patient Gender: F                  HR:           61 bpm. Exam Location:  Inpatient Procedure: 2D Echo Indications:    syncope 780.2  History:        Patient has no prior history of Echocardiogram examinations.                 Arrythmias:paroxysmal atrial fibrillation.  Sonographer:    Johny Chess Referring Phys: [place]  1. Left ventricular ejection fraction, by estimation, is 60 to 65%. The left ventricle has normal function. The left ventricle has no regional wall motion abnormalities. There is mild concentric left ventricular hypertrophy. Left ventricular diastolic parameters are consistent with Grade I diastolic dysfunction (impaired relaxation).  2. Right ventricular systolic function is normal. The right ventricular size is normal.  3. The mitral valve is normal in structure. Trivial mitral valve regurgitation.  4. The aortic valve is tricuspid. There is mild calcification of the aortic valve. There is mild thickening of the aortic valve. Aortic valve regurgitation is trivial. Mild aortic valve sclerosis is present, with no evidence of aortic valve stenosis. Comparison(s): No prior Echocardiogram. FINDINGS  Left Ventricle: Left ventricular ejection fraction, by estimation, is 60 to 65%. The left ventricle has normal function. The left ventricle has no  regional wall motion abnormalities. The left ventricular internal cavity size was normal in size. There is  mild concentric left ventricular hypertrophy. Left ventricular diastolic parameters are consistent with Grade I diastolic dysfunction (impaired relaxation). The E/e' is >14 suggestive of elevated LAP. Right Ventricle: The right ventricular size is  normal. Right vetricular wall thickness was not assessed. Right ventricular systolic function is normal. Left Atrium: Left atrial size was normal in size. Right Atrium: Right atrial size was not well visualized. Pericardium: There is no evidence of pericardial effusion. Mitral Valve: There is dense, focal calcification of the posterior subvalvular apparatus. The mitral valve is normal in structure. There is mild thickening of the mitral valve leaflet(s). There is mild calcification of the mitral valve leaflet(s). Mild to moderate mitral annular calcification. Trivial mitral valve regurgitation. Tricuspid Valve: The tricuspid valve is normal in structure. Tricuspid valve regurgitation is trivial. Aortic Valve: The aortic valve is tricuspid. There is mild calcification of the aortic valve. There is mild thickening of the aortic valve. Aortic valve regurgitation is trivial. Mild aortic valve sclerosis is present, with no evidence of aortic valve stenosis. Pulmonic Valve: The pulmonic valve was normal in structure. Pulmonic valve regurgitation is mild. Aorta: The aortic root is normal in size and structure. IAS/Shunts: The atrial septum is grossly normal.  LEFT VENTRICLE PLAX 2D LVIDd:         3.80 cm  Diastology LVIDs:         2.30 cm  LV e' medial:    4.79 cm/s LV PW:         1.10 cm  LV E/e' medial:  17.2 LV IVS:        1.00 cm  LV e' lateral:   5.11 cm/s LVOT diam:     1.80 cm  LV E/e' lateral: 16.1 LV SV:         64 LV SV Index:   42 LVOT Area:     2.54 cm  RIGHT VENTRICLE             IVC RV S prime:     15.70 cm/s  IVC diam: 1.00 cm LEFT ATRIUM             Index  LA diam:        2.70 cm 1.75 cm/m LA Vol (A2C):   37.2 ml 24.18 ml/m LA Vol (A4C):   49.3 ml 32.04 ml/m LA Biplane Vol: 43.6 ml 28.34 ml/m  AORTIC VALVE LVOT Vmax:   90.70 cm/s LVOT Vmean:  56.700 cm/s LVOT VTI:    0.252 m  AORTA Ao Root diam: 3.00 cm Ao Asc diam:  3.10 cm MITRAL VALVE MV Area (PHT): 2.21 cm     SHUNTS MV Decel Time: 343 msec     Systemic VTI:  0.25 m MV E velocity: 82.50 cm/s   Systemic Diam: 1.80 cm MV A velocity: 143.00 cm/s MV E/A ratio:  0.58 Gwyndolyn Kaufman MD Electronically signed by Gwyndolyn Kaufman MD Signature Date/Time: 12/11/2019/3:45:30 PM    Final    VAS US CAROTID  Result Date: 12/12/2019 Carotid Arterial Duplex Study Indications:       Syncope. Risk Factors:      Hypertension. Other Factors:     Atrial fibrillation,. Comparison Study:  No prior study on file Performing Technologist: Sharion Dove RVS  Examination Guidelines: A complete evaluation includes B-mode imaging, spectral Doppler, color Doppler, and power Doppler as needed of all accessible portions of each vessel. Bilateral testing is considered an integral part of a complete examination. Limited examinations for reoccurring indications may be performed as noted.  Right Carotid Findings: +----------+--------+--------+--------+------------------+------------------+           PSV cm/sEDV cm/sStenosisPlaque DescriptionComments           +----------+--------+--------+--------+------------------+------------------+ CCA Prox  56  13                                intimal thickening +----------+--------+--------+--------+------------------+------------------+ CCA Distal68      12                                intimal thickening +----------+--------+--------+--------+------------------+------------------+ ICA Prox  59      9               heterogenous      tortuous           +----------+--------+--------+--------+------------------+------------------+ ICA Distal34      7                                  tortuous           +----------+--------+--------+--------+------------------+------------------+ ECA       49      6                                                    +----------+--------+--------+--------+------------------+------------------+ +----------+--------+-------+--------+-------------------+           PSV cm/sEDV cmsDescribeArm Pressure (mmHG) +----------+--------+-------+--------+-------------------+ [id number]                                         +----------+--------+-------+--------+-------------------+ +---------+--------+--+--------+-+ VertebralPSV cm/s49EDV cm/s7 +---------+--------+--+--------+-+  Left Carotid Findings: +----------+--------+--------+--------+------------------+------------------+           PSV cm/sEDV cm/sStenosisPlaque DescriptionComments           +----------+--------+--------+--------+------------------+------------------+ CCA Prox  56      13                                intimal thickening +----------+--------+--------+--------+------------------+------------------+ CCA Distal54      13                                intimal thickening +----------+--------+--------+--------+------------------+------------------+ ICA Prox  52      15              heterogenous      tortuous           +----------+--------+--------+--------+------------------+------------------+ ICA Mid   66      13                                                   +----------+--------+--------+--------+------------------+------------------+ ICA Distal33      9                                 tortuous           +----------+--------+--------+--------+------------------+------------------+ ECA       68      8                                                    +----------+--------+--------+--------+------------------+------------------+ +----------+--------+--------+--------+-------------------+  PSV  cm/sEDV cm/sDescribeArm Pressure (mmHG) +----------+--------+--------+--------+-------------------+ Subclavian50                                          +----------+--------+--------+--------+-------------------+ +---------+--------+--+--------+-+ VertebralPSV cm/s40EDV cm/s3 +---------+--------+--+--------+-+   Summary: Right Carotid: The extracranial vessels were near-normal with only minimal wall                thickening or plaque. Left Carotid: The extracranial vessels were near-normal with only minimal wall               thickening or plaque. Vertebrals:  Bilateral vertebral arteries demonstrate antegrade flow. Subclavians: Normal flow hemodynamics were seen in bilateral subclavian              arteries. *See table(s) above for measurements and observations.  Electronically signed by Antony Contras MD on 12/12/2019 at 2:33:39 PM.    Final         Scheduled Meds: . amLODipine  5 mg Oral Daily  . apixaban  2.5 mg Oral BID  . metoprolol succinate  12.5 mg Oral Daily  . sertraline  50 mg Oral Daily   Continuous Infusions:   LOS: 1 day    Time spent: 36min  Domenic Polite, MD [facility] 12/12/2019, 3:25 PM

## 2019-12-12 NOTE — Progress Notes (Signed)
Physical Therapy Treatment Patient Details Name: Christina Lawson MRN: [id number] DOB: Apr 17, 1923 Today's Date: 12/12/2019    History of Present Illness Christina Lawson is a 86 y.o. female with medical history significant of paroxysmal atrial fibrillation, hypothyroidism, essential hypertension, osteoporosis who is a resident of [facility] was in her usual state of health when she woke up this morning, went to use the bathroom and then found herself on the floor with a laceration on the back of her scalp.After she woke up she had soreness in the back of her scalp and her left elbow. She denied any chest pain shortness of breath palpitations or dizziness.  Dx with orthostatic hypotension, suspected autonomic neuropathy, accelerated HTN, PAF, scalp lac, elbow skin tear.      PT Comments    Pt is progressing well towards her goals.  She was able to walk down the hallway with the support of her walker and minimal help from PT.  Her preference is to go back to her ALF and have therapy there.  She did not get symptomatic in standing or walking with me, only in sitting (ligtheaded).  What orthostatics I could get were negative.  PT will continue to follow acutely for safe mobility progression.   Follow Up Recommendations  Home health PT;Other (comment) (at [facility])     Equipment Recommendations  Rolling walker with 5" wheels    Recommendations for Other Services       Precautions / Restrictions Precautions Precautions: Fall Precaution Comments: monitor BP, vital    Mobility  Bed Mobility Overal bed mobility: Needs Assistance Bed Mobility: Supine to Sit     Supine to sit: Min assist     General bed mobility comments: Min hand held assist to come up to sitting EOB.  Extra time needed to complete.   Transfers Overall transfer level: Needs assistance Equipment used: Rolling walker (2 wheeled) Transfers: Sit to/from Stand Sit to Stand: Min guard          General transfer comment: Min guard assist for safety and balance, cues for safe hand placement and controlled descent to sit.   Ambulation/Gait Ambulation/Gait assistance: Min assist Gait Distance (Feet): 120 Feet Assistive device: Rolling walker (2 wheeled) Gait Pattern/deviations: Step-through pattern;Shuffle;Trunk flexed     General Gait Details: Pt was able to walk a good distance down the hallway without reports of lightheadedness.  She reports she does walk to the dining hall for meals, so this is a normal distance for her.  Cues for upright posture and cloer proximity to the RW.  She does not normally use a walker, but does not feel strong enough right now to go without.    Stairs             Wheelchair Mobility    Modified Rankin (Stroke Patients Only)       Balance Overall balance assessment: Needs assistance Sitting-balance support: Feet supported;Bilateral upper extremity supported Sitting balance-Leahy Scale: Fair     Standing balance support: Bilateral upper extremity supported Standing balance-Leahy Scale: Poor Standing balance comment: needs external support from [facility], therapist                            Cognition Arousal/Alertness: Awake/alert Behavior During Therapy: WFL for tasks assessed/performed Overall Cognitive Status: Within Functional Limits for tasks assessed  General Comments: [facility]      Exercises General Exercises - Upper Extremity Shoulder Flexion: AROM;Both;10 reps Elbow Flexion: AROM;Both;10 reps General Exercises - Lower Extremity Long Arc Quad: AROM;Both;10 reps Hip Flexion/Marching: AROM;Both;10 reps Toe Raises: AROM;Both;10 reps Heel Raises: AROM;Both;10 reps    General Comments        Pertinent Vitals/Pain Pain Assessment: Faces Faces Pain Scale: Hurts little more Pain Location: head Pain Descriptors / Indicators: Grimacing;Guarding Pain  Intervention(s): Limited activity within patient's tolerance;Monitored during session;Repositioned    Home Living                      Prior Function            PT Goals (current goals can now be found in the care plan section) Acute Rehab PT Goals Patient Stated Goal: "to go home" Progress towards PT goals: Progressing toward goals    Frequency    Min 3X/week      PT Plan Current plan remains appropriate    Co-evaluation              AM-PAC PT "6 Clicks" Mobility   Outcome Measure  Help needed turning from your back to your side while in a flat bed without using bedrails?: A Little Help needed moving from lying on your back to sitting on the side of a flat bed without using bedrails?: A Little Help needed moving to and from a bed to a chair (including a wheelchair)?: A Little Help needed standing up from a chair using your arms (e.g., wheelchair or bedside chair)?: A Little Help needed to walk in hospital room?: A Little Help needed climbing 3-5 steps with a railing? : A Little 6 Click Score: 18    End of Session Equipment Utilized During Treatment: Gait belt Activity Tolerance: Patient limited by fatigue Patient left: in chair;with call bell/phone within reach;with chair alarm set   PT Visit Diagnosis: Unsteadiness on feet (R26.81);Other abnormalities of gait and mobility (R26.89);Dizziness and giddiness (R42);Difficulty in walking, not elsewhere classified (R26.2)     Time: 1610-9604 PT Time Calculation (min) (ACUTE ONLY): 32 min  Charges:  $Gait Training: 8-22 mins $Therapeutic Activity: 8-22 mins                     Verdene Lennert, PT, DPT  Acute Rehabilitation [phone] pager [phone]) [phone] office

## 2019-12-13 DIAGNOSIS — R42 Dizziness and giddiness: Secondary | ICD-10-CM

## 2019-12-13 NOTE — Progress Notes (Signed)
PROGRESS NOTE    Christina Lawson  EPP:[id number] DOB: 08/04/1923 DOA: 12/10/2019 PCP: Gayland Curry, DO  Brief Narrative: Christina Lawson is a 86 y.o. female with medical history significant of paroxysmal atrial fibrillation, hypothyroidism, essential hypertension, osteoporosis who is a resident of [facility] was in her usual state of health when she woke up 9/20 morning, went to use the bathroom and then found herself on the floor with a laceration on the back of her scalp. -After she woke up she had soreness in the back of her scalp and her left elbow -She denied any chest pain shortness of breath palpitations or dizziness ED Course: Work-up in the ED noted laceration on her posterior scalp and left elbow. -Overnight blood pressure in the 606-301 systolic range   Assessment & Plan:     Syncope and collapse Orthostatic hypotension Suspected autonomic neuropathy -EKG with Q waves in inferior leads which is unchanged from prior -2D echo unremarkable, TSH and troponins negative -Orthostatics significantly positive, no history suggestive of dehydration, given IV fluids for 12 hours after admission, supine blood pressure remains considerably elevated however have avoided aggressive titration given much lower standing blood pressures and dizziness -Some improvement in symptoms noted, orthostatic still positive, suspect she may have autonomic neuropathy with advanced age -Continue TED hose, advised importance of adequate hydration, caution with position changes, physical therapy -Continue to attempt ambulation as tolerated   Accelerated hypertension Orthostatic hypotension -See discussion above, continue Toprol and started on low-dose amlodipine -Avoid excessive titration of supine BP given positive orthostasis    PAF (paroxysmal atrial fibrillation) (HCC) -In sinus rhythm, continue Toprol and Eliquis  Scalp laceration Elbow skin tear -Status post multiple  staples  -Wound care   DVT prophylaxis: Eliquis Code Status: DNR Family Communication: Daughter at bedside Disposition Plan:  Status is: Inpatient Due to severity of illness   Dispo: The patient is from: ALF              Anticipated d/c is to: ALF              Anticipated d/c date is: 1-2 days pending improvement in dizziness, stabilization of blood pressure              Patient currently is not medically stable to d/c.  Consultants:   Cards   Procedures:   Antimicrobials:    Subjective: -Dizzy when she tried to stand up today but overall feeling a little better  Objective: Vitals:   12/13/19 0456 12/13/19 0907 12/13/19 1100 12/13/19 1242  BP: 135/71 (!) 126/94  132/86  Pulse: 68   68  Resp: 17   18  Temp: 97.9 F (36.6 C)  (!) 97.3 F (36.3 C) (!) 97.5 F (36.4 C)  TempSrc: Oral  Oral Oral  SpO2: 99%  97% 95%  Weight: 60.1 kg     Height:        Intake/Output Summary (Last 24 hours) at 12/13/2019 1521 Last data filed at 12/13/2019 6010 Gross per 24 hour  Intake 360 ml  Output --  Net 360 ml   Filed Weights   12/10/19 1535 12/12/19 0435 12/13/19 0456  Weight: 54.4 kg 59.1 kg 60.1 kg    Examination:  General exam: Elderly frail chronically ill female laying in bed, awake alert oriented x3, no distress HEENT: Scalp laceration with staples CVS: S1-S2, regular rate rhythm Lungs: Clear bilaterally Abdomen: Soft, nontender, bowel sounds present Extremities: No edema, left elbow skin tear Skin: As above  psychiatry: Judgement and insight appear normal. Mood & affect appropriate.     Data Reviewed:   CBC: Recent Labs  Lab 12/10/19 1900 12/11/19 0445  WBC 9.7 8.5  NEUTROABS 7.6  --   HGB 13.1 13.3  HCT 39.8 39.8  MCV 95.2 93.0  PLT 229 620   Basic Metabolic Panel: Recent Labs  Lab 12/10/19 1203 12/10/19 1900 12/11/19 0445  NA 139  --  136  K 3.9  --  3.7  CL 105  --  102  CO2 25  --  24  GLUCOSE 123*  --  108*  BUN 18  --  12   CREATININE 0.86 0.72 0.72  CALCIUM 9.4  --  9.1   GFR: Estimated Creatinine Clearance: 34 mL/min (by C-G formula based on SCr of 0.72 mg/dL). Liver Function Tests: Recent Labs  Lab 12/10/19 1203 12/11/19 0445  AST 23 27  ALT 16 19  ALKPHOS 47 54  BILITOT 0.9 1.3*  PROT 6.7 6.5  ALBUMIN 3.5 3.3*   No results for input(s): LIPASE, AMYLASE in the last 168 hours. No results for input(s): AMMONIA in the last 168 hours. Coagulation Profile: No results for input(s): INR, PROTIME in the last 168 hours. Cardiac Enzymes: No results for input(s): CKTOTAL, CKMB, CKMBINDEX, TROPONINI in the last 168 hours. BNP (last 3 results) No results for input(s): PROBNP in the last 8760 hours. HbA1C: No results for input(s): HGBA1C in the last 72 hours. CBG: No results for input(s): GLUCAP in the last 168 hours. Lipid Profile: No results for input(s): CHOL, HDL, LDLCALC, TRIG, CHOLHDL, LDLDIRECT in the last 72 hours. Thyroid Function Tests: No results for input(s): TSH, T4TOTAL, FREET4, T3FREE, THYROIDAB in the last 72 hours. Anemia Panel: No results for input(s): VITAMINB12, FOLATE, FERRITIN, TIBC, IRON, RETICCTPCT in the last 72 hours. Urine analysis: No results found for: COLORURINE, APPEARANCEUR, LABSPEC, PHURINE, GLUCOSEU, HGBUR, BILIRUBINUR, KETONESUR, PROTEINUR, UROBILINOGEN, NITRITE, LEUKOCYTESUR Sepsis Labs: @LABRCNTIP (procalcitonin:4,lacticidven:4)  ) Recent Results (from the past 240 hour(s))  Respiratory Panel by RT PCR (Flu A&B, Covid) - Nasopharyngeal Swab     Status: None   Collection Time: 12/10/19  3:30 PM   Specimen: Nasopharyngeal Swab  Result Value Ref Range Status   SARS Coronavirus 2 by RT PCR NEGATIVE NEGATIVE Final    Comment: (NOTE) SARS-CoV-2 target nucleic acids are NOT DETECTED.  The SARS-CoV-2 RNA is generally detectable in upper respiratoy specimens during the acute phase of infection. The lowest concentration of SARS-CoV-2 viral copies this assay can detect  is 131 copies/mL. A negative result does not preclude SARS-Cov-2 infection and should not be used as the sole basis for treatment or other patient management decisions. A negative result may occur with  improper specimen collection/handling, submission of specimen other than nasopharyngeal swab, presence of viral mutation(s) within the areas targeted by this assay, and inadequate number of viral copies (<131 copies/mL). A negative result must be combined with clinical observations, patient history, and epidemiological information. The expected result is Negative.  Fact Sheet for Patients:  PinkCheek.be  Fact Sheet for Healthcare Providers:  GravelBags.it  This test is no t yet approved or cleared by the [place] FDA and  has been authorized for detection and/or diagnosis of SARS-CoV-2 by FDA under an Emergency Use Authorization (EUA). This EUA will remain  in effect (meaning this test can be used) for the duration of the COVID-19 declaration under Section 564(b)(1) of the Act, 21 U.S.C. section 360bbb-3(b)(1), unless the authorization is terminated or revoked  sooner.     Influenza A by PCR NEGATIVE NEGATIVE Final   Influenza B by PCR NEGATIVE NEGATIVE Final    Comment: (NOTE) The Xpert Xpress SARS-CoV-2/FLU/RSV assay is intended as an aid in  the diagnosis of influenza from Nasopharyngeal swab specimens and  should not be used as a sole basis for treatment. Nasal washings and  aspirates are unacceptable for Xpert Xpress SARS-CoV-2/FLU/RSV  testing.  Fact Sheet for Patients: PinkCheek.be  Fact Sheet for Healthcare Providers: GravelBags.it  This test is not yet approved or cleared by the [place] FDA and  has been authorized for detection and/or diagnosis of SARS-CoV-2 by  FDA under an Emergency Use Authorization (EUA). This EUA will remain  in effect  (meaning this test can be used) for the duration of the  Covid-19 declaration under Section 564(b)(1) of the Act, 21  U.S.C. section 360bbb-3(b)(1), unless the authorization is  terminated or revoked. Performed at [facility], [place] [address] [place], [place] [address]     Radiology Studies: VAS US CAROTID  Result Date: 12/12/2019 Carotid Arterial Duplex Study Indications:       Syncope. Risk Factors:      Hypertension. Other Factors:     Atrial fibrillation,. Comparison Study:  No prior study on file Performing Technologist: Sharion Dove RVS  Examination Guidelines: A complete evaluation includes B-mode imaging, spectral Doppler, color Doppler, and power Doppler as needed of all accessible portions of each vessel. Bilateral testing is considered an integral part of a complete examination. Limited examinations for reoccurring indications may be performed as noted.  Right Carotid Findings: +----------+--------+--------+--------+------------------+------------------+           PSV cm/sEDV cm/sStenosisPlaque DescriptionComments           +----------+--------+--------+--------+------------------+------------------+ CCA Prox  56      13                                intimal thickening +----------+--------+--------+--------+------------------+------------------+ CCA Distal68      12                                intimal thickening +----------+--------+--------+--------+------------------+------------------+ ICA Prox  59      9               heterogenous      tortuous           +----------+--------+--------+--------+------------------+------------------+ ICA Distal34      7                                 tortuous           +----------+--------+--------+--------+------------------+------------------+ ECA       49      6                                                    +----------+--------+--------+--------+------------------+------------------+  +----------+--------+-------+--------+-------------------+           PSV cm/sEDV cmsDescribeArm Pressure (mmHG) +----------+--------+-------+--------+-------------------+ [id number]                                         +----------+--------+-------+--------+-------------------+ +---------+--------+--+--------+-+  VertebralPSV cm/s49EDV cm/s7 +---------+--------+--+--------+-+  Left Carotid Findings: +----------+--------+--------+--------+------------------+------------------+           PSV cm/sEDV cm/sStenosisPlaque DescriptionComments           +----------+--------+--------+--------+------------------+------------------+ CCA Prox  56      13                                intimal thickening +----------+--------+--------+--------+------------------+------------------+ CCA Distal54      13                                intimal thickening +----------+--------+--------+--------+------------------+------------------+ ICA Prox  52      15              heterogenous      tortuous           +----------+--------+--------+--------+------------------+------------------+ ICA Mid   66      13                                                   +----------+--------+--------+--------+------------------+------------------+ ICA Distal33      9                                 tortuous           +----------+--------+--------+--------+------------------+------------------+ ECA       68      8                                                    +----------+--------+--------+--------+------------------+------------------+ +----------+--------+--------+--------+-------------------+           PSV cm/sEDV cm/sDescribeArm Pressure (mmHG) +----------+--------+--------+--------+-------------------+ Subclavian50                                          +----------+--------+--------+--------+-------------------+ +---------+--------+--+--------+-+ VertebralPSV  cm/s40EDV cm/s3 +---------+--------+--+--------+-+   Summary: Right Carotid: The extracranial vessels were near-normal with only minimal wall                thickening or plaque. Left Carotid: The extracranial vessels were near-normal with only minimal wall               thickening or plaque. Vertebrals:  Bilateral vertebral arteries demonstrate antegrade flow. Subclavians: Normal flow hemodynamics were seen in bilateral subclavian              arteries. *See table(s) above for measurements and observations.  Electronically signed by Antony Contras MD on 12/12/2019 at 2:33:39 PM.    Final    Scheduled Meds: . amLODipine  5 mg Oral Daily  . apixaban  2.5 mg Oral BID  . metoprolol succinate  12.5 mg Oral Daily  . sertraline  50 mg Oral Daily   Continuous Infusions:   LOS: 2 days    Time spent: 44min  Domenic Polite, MD [facility] 12/13/2019, 3:21 PM

## 2019-12-13 NOTE — Progress Notes (Signed)
RN to walk patient. Pt felt dizzy after standing up, resolved after 2 min. Pt also c/o of visual distortion on left side. Will try to walk pt later. MD notified.

## 2019-12-13 NOTE — Progress Notes (Signed)
Progress Note  Patient Name: Christina Lawson Date of Encounter: 12/13/2019  Primary Cardiologist: Elouise Munroe, MD   Subjective   Markedly orthostatic on exam yesterday and symptomatic.  Now on compression hose but only knee highs and abdominal binder not placed.  Denies any chest pain or SOB.  Gets dizzy when standing up  Inpatient Medications    Scheduled Meds: . amLODipine  5 mg Oral Daily  . apixaban  2.5 mg Oral BID  . metoprolol succinate  12.5 mg Oral Daily  . sertraline  50 mg Oral Daily   Continuous Infusions:  PRN Meds: acetaminophen, hydrALAZINE   Vital Signs    Vitals:   12/12/19 2207 12/13/19 0001 12/13/19 0456 12/13/19 0907  BP: (!) 174/82 (!) 146/84 135/71 (!) 126/94  Pulse:  63 68   Resp:  16 17   Temp:   97.9 F (36.6 C)   TempSrc:   Oral   SpO2:   99%   Weight:   60.1 kg   Height:        Intake/Output Summary (Last 24 hours) at 12/13/2019 0923 Last data filed at 12/13/2019 0911 Gross per 24 hour  Intake 360 ml  Output 650 ml  Net -290 ml   Filed Weights   12/10/19 1535 12/12/19 0435 12/13/19 0456  Weight: 54.4 kg 59.1 kg 60.1 kg    Telemetry    NSR with PACs - Personally Reviewed  ECG    No new EKG to review - Personally Reviewed  Physical Exam   GEN: No acute distress.   Neck: No JVD Cardiac: RRR, no murmurs, rubs, or gallops.  Respiratory: Clear to auscultation bilaterally. GI: Soft, nontender, non-distended  MS: No edema; No deformity. Neuro:  Nonfocal  Psych: Normal affect   Labs    Chemistry Recent Labs  Lab 12/10/19 1203 12/10/19 1900 12/11/19 0445  NA 139  --  136  K 3.9  --  3.7  CL 105  --  102  CO2 25  --  24  GLUCOSE 123*  --  108*  BUN 18  --  12  CREATININE 0.86 0.72 0.72  CALCIUM 9.4  --  9.1  PROT 6.7  --  6.5  ALBUMIN 3.5  --  3.3*  AST 23  --  27  ALT 16  --  19  ALKPHOS 47  --  54  BILITOT 0.9  --  1.3*  GFRNONAA 58* >60 >60  GFRAA >60 >60 >60  ANIONGAP 9  --  10      Hematology Recent Labs  Lab 12/10/19 1900 12/11/19 0445  WBC 9.7 8.5  RBC 4.18 4.28  HGB 13.1 13.3  HCT 39.8 39.8  MCV 95.2 93.0  MCH 31.3 31.1  MCHC 32.9 33.4  RDW 14.1 13.9  PLT 229 211    Cardiac EnzymesNo results for input(s): TROPONINI in the last 168 hours. No results for input(s): TROPIPOC in the last 168 hours.   BNPNo results for input(s): BNP, PROBNP in the last 168 hours.   DDimer No results for input(s): DDIMER in the last 168 hours.   Radiology    ECHOCARDIOGRAM COMPLETE  Result Date: 12/11/2019    ECHOCARDIOGRAM REPORT   Patient Name:   Christina Lawson Date of Exam: 12/11/2019 Medical Rec #:  [id number]          Height:       62.0 in Accession #:    [id number]         Weight:  120.0 lb Date of Birth:  [birth year]           BSA:          1.539 m Patient Age:    9 years           BP:           131/107 mmHg Patient Gender: F                  HR:           61 bpm. Exam Location:  Inpatient Procedure: 2D Echo Indications:    syncope 780.2  History:        Patient has no prior history of Echocardiogram examinations.                 Arrythmias:paroxysmal atrial fibrillation.  Sonographer:    Johny Chess Referring Phys: [place]  1. Left ventricular ejection fraction, by estimation, is 60 to 65%. The left ventricle has normal function. The left ventricle has no regional wall motion abnormalities. There is mild concentric left ventricular hypertrophy. Left ventricular diastolic parameters are consistent with Grade I diastolic dysfunction (impaired relaxation).  2. Right ventricular systolic function is normal. The right ventricular size is normal.  3. The mitral valve is normal in structure. Trivial mitral valve regurgitation.  4. The aortic valve is tricuspid. There is mild calcification of the aortic valve. There is mild thickening of the aortic valve. Aortic valve regurgitation is trivial. Mild aortic valve sclerosis is present, with no evidence  of aortic valve stenosis. Comparison(s): No prior Echocardiogram. FINDINGS  Left Ventricle: Left ventricular ejection fraction, by estimation, is 60 to 65%. The left ventricle has normal function. The left ventricle has no regional wall motion abnormalities. The left ventricular internal cavity size was normal in size. There is  mild concentric left ventricular hypertrophy. Left ventricular diastolic parameters are consistent with Grade I diastolic dysfunction (impaired relaxation). The E/e' is >14 suggestive of elevated LAP. Right Ventricle: The right ventricular size is normal. Right vetricular wall thickness was not assessed. Right ventricular systolic function is normal. Left Atrium: Left atrial size was normal in size. Right Atrium: Right atrial size was not well visualized. Pericardium: There is no evidence of pericardial effusion. Mitral Valve: There is dense, focal calcification of the posterior subvalvular apparatus. The mitral valve is normal in structure. There is mild thickening of the mitral valve leaflet(s). There is mild calcification of the mitral valve leaflet(s). Mild to moderate mitral annular calcification. Trivial mitral valve regurgitation. Tricuspid Valve: The tricuspid valve is normal in structure. Tricuspid valve regurgitation is trivial. Aortic Valve: The aortic valve is tricuspid. There is mild calcification of the aortic valve. There is mild thickening of the aortic valve. Aortic valve regurgitation is trivial. Mild aortic valve sclerosis is present, with no evidence of aortic valve stenosis. Pulmonic Valve: The pulmonic valve was normal in structure. Pulmonic valve regurgitation is mild. Aorta: The aortic root is normal in size and structure. IAS/Shunts: The atrial septum is grossly normal.  LEFT VENTRICLE PLAX 2D LVIDd:         3.80 cm  Diastology LVIDs:         2.30 cm  LV e' medial:    4.79 cm/s LV PW:         1.10 cm  LV E/e' medial:  17.2 LV IVS:        1.00 cm  LV e' lateral:    5.11 cm/s LVOT diam:  1.80 cm  LV E/e' lateral: 16.1 LV SV:         64 LV SV Index:   42 LVOT Area:     2.54 cm  RIGHT VENTRICLE             IVC RV S prime:     15.70 cm/s  IVC diam: 1.00 cm LEFT ATRIUM             Index LA diam:        2.70 cm 1.75 cm/m LA Vol (A2C):   37.2 ml 24.18 ml/m LA Vol (A4C):   49.3 ml 32.04 ml/m LA Biplane Vol: 43.6 ml 28.34 ml/m  AORTIC VALVE LVOT Vmax:   90.70 cm/s LVOT Vmean:  56.700 cm/s LVOT VTI:    0.252 m  AORTA Ao Root diam: 3.00 cm Ao Asc diam:  3.10 cm MITRAL VALVE MV Area (PHT): 2.21 cm     SHUNTS MV Decel Time: 343 msec     Systemic VTI:  0.25 m MV E velocity: 82.50 cm/s   Systemic Diam: 1.80 cm MV A velocity: 143.00 cm/s MV E/A ratio:  0.58 Gwyndolyn Kaufman MD Electronically signed by Gwyndolyn Kaufman MD Signature Date/Time: 12/11/2019/3:45:30 PM    Final    VAS US CAROTID  Result Date: 12/12/2019 Carotid Arterial Duplex Study Indications:       Syncope. Risk Factors:      Hypertension. Other Factors:     Atrial fibrillation,. Comparison Study:  No prior study on file Performing Technologist: Sharion Dove RVS  Examination Guidelines: A complete evaluation includes B-mode imaging, spectral Doppler, color Doppler, and power Doppler as needed of all accessible portions of each vessel. Bilateral testing is considered an integral part of a complete examination. Limited examinations for reoccurring indications may be performed as noted.  Right Carotid Findings: +----------+--------+--------+--------+------------------+------------------+           PSV cm/sEDV cm/sStenosisPlaque DescriptionComments           +----------+--------+--------+--------+------------------+------------------+ CCA Prox  56      13                                intimal thickening +----------+--------+--------+--------+------------------+------------------+ CCA Distal68      12                                intimal thickening  +----------+--------+--------+--------+------------------+------------------+ ICA Prox  59      9               heterogenous      tortuous           +----------+--------+--------+--------+------------------+------------------+ ICA Distal34      7                                 tortuous           +----------+--------+--------+--------+------------------+------------------+ ECA       49      6                                                    +----------+--------+--------+--------+------------------+------------------+ +----------+--------+-------+--------+-------------------+           PSV cm/sEDV  cmsDescribeArm Pressure (mmHG) +----------+--------+-------+--------+-------------------+ [id number]                                         +----------+--------+-------+--------+-------------------+ +---------+--------+--+--------+-+ VertebralPSV cm/s49EDV cm/s7 +---------+--------+--+--------+-+  Left Carotid Findings: +----------+--------+--------+--------+------------------+------------------+           PSV cm/sEDV cm/sStenosisPlaque DescriptionComments           +----------+--------+--------+--------+------------------+------------------+ CCA Prox  56      13                                intimal thickening +----------+--------+--------+--------+------------------+------------------+ CCA Distal54      13                                intimal thickening +----------+--------+--------+--------+------------------+------------------+ ICA Prox  52      15              heterogenous      tortuous           +----------+--------+--------+--------+------------------+------------------+ ICA Mid   66      13                                                   +----------+--------+--------+--------+------------------+------------------+ ICA Distal33      9                                 tortuous            +----------+--------+--------+--------+------------------+------------------+ ECA       68      8                                                    +----------+--------+--------+--------+------------------+------------------+ +----------+--------+--------+--------+-------------------+           PSV cm/sEDV cm/sDescribeArm Pressure (mmHG) +----------+--------+--------+--------+-------------------+ Subclavian50                                          +----------+--------+--------+--------+-------------------+ +---------+--------+--+--------+-+ VertebralPSV cm/s40EDV cm/s3 +---------+--------+--+--------+-+   Summary: Right Carotid: The extracranial vessels were near-normal with only minimal wall                thickening or plaque. Left Carotid: The extracranial vessels were near-normal with only minimal wall               thickening or plaque. Vertebrals:  Bilateral vertebral arteries demonstrate antegrade flow. Subclavians: Normal flow hemodynamics were seen in bilateral subclavian              arteries. *See table(s) above for measurements and observations.  Electronically signed by Antony Contras MD on 12/12/2019 at 2:33:39 PM.    Final     Cardiac Studies   2D echo 9/30/20201 IMPRESSIONS    1. Left ventricular ejection fraction, by estimation, is 60 to 65%. The  left ventricle has normal  function. The left ventricle has no regional  wall motion abnormalities. There is mild concentric left ventricular  hypertrophy. Left ventricular diastolic  parameters are consistent with Grade I diastolic dysfunction (impaired  relaxation).  2. Right ventricular systolic function is normal. The right ventricular  size is normal.  3. The mitral valve is normal in structure. Trivial mitral valve  regurgitation.  4. The aortic valve is tricuspid. There is mild calcification of the  aortic valve. There is mild thickening of the aortic valve. Aortic valve  regurgitation is trivial.  Mild aortic valve sclerosis is present, with no  evidence of aortic valve stenosis.   Patient Profile     86 y.o. female with a hx of paroxysmal atrial fibrillation, TIA, prior syncope, hypothyroidism, tremor and hyperlipidemia who is being seen for the evaluation of syncope at the request of Dr. Broadus John.  Assessment & Plan    1. Syncope>>Orthostatic Hypotension -Patient felt lightheaded prior to syncope otherwise, no prodromal symptoms.   -She denies chest pain, shortness of breath or palpitation.   -hsTroponin negative.   -EKG without acute ischemic changes.   -Telemetry shows sinus rhythm at rate of 60s. -2D echo with normal LVF and G1DD, trivial MR and mild AVSC with no AS -profoundly orthostatic on exam yesterday and symptomatic -change knee high to thigh high compression hose and add abdominal binder -repeat orthostatic BPs today -midodrine and florinef not options due to baseline supine HTN -may need to add mestinon if she continues to have orthostasis  2.  Paroxysmal atrial fibrillation -Maintaining sinus rhythm at rate of 50-60s on Home Toprol-XL 12.5 mg qd -On Eliquis2.5mg  BID for anticoagulation -CHADSVASCs score of 6 for age, sex, HTN and TIA -patient wants to remain on a/c despite risks involved with syncopal episodes>>she will try to be very cautious with ambulation  3.  Accelerated hypertension -BP improved today at 126/32mmHg -continue amlodipine 5mg  daily and Toprol XL 12.5mg  daily    For questions or updates, please contact [place] Please consult [email or web address] for contact info under Cardiology/STEMI.      Signed, Fransico Him, MD  12/13/2019, 9:23 AM

## 2019-12-14 MED ORDER — FLUTICASONE FUROATE-VILANTEROL 100-25 MCG/INH IN AEPB
1.0000 | INHALATION_SPRAY | Freq: Every day | RESPIRATORY_TRACT | Status: DC
  Administered 2019-12-14 – 2019-12-15 (×2): 1 via RESPIRATORY_TRACT
  Filled 2019-12-14: qty 28

## 2019-12-14 MED ORDER — FLUTICASONE PROPIONATE 50 MCG/ACT NA SUSP
2.0000 | Freq: Every day | NASAL | Status: DC
  Administered 2019-12-14 – 2019-12-15 (×2): 2 via NASAL
  Filled 2019-12-14: qty 16

## 2019-12-14 MED ORDER — PYRIDOSTIGMINE BROMIDE 60 MG PO TABS
30.0000 mg | ORAL_TABLET | Freq: Every day | ORAL | Status: DC
  Administered 2019-12-14 – 2019-12-15 (×2): 30 mg via ORAL
  Filled 2019-12-14 (×2): qty 0.5

## 2019-12-14 MED ORDER — IBUPROFEN 200 MG PO TABS: 200.0000 mg | ORAL_TABLET | Freq: Four times a day (QID) | ORAL | Status: DC | PRN

## 2019-12-14 NOTE — Progress Notes (Addendum)
Progress Note  Patient Name: Christina Lawson Date of Encounter: 12/14/2019  Primary Cardiologist: Elouise Munroe, MD   Subjective   Orthostatic BPs improved but still getting dizzy when standing up.  Denies any  palpitations, chest pain or SOB.  Had a 9 beat run of nonsustained atrial tachycardia.   Inpatient Medications    Scheduled Meds: . amLODipine  5 mg Oral Daily  . apixaban  2.5 mg Oral BID  . metoprolol succinate  12.5 mg Oral Daily  . sertraline  50 mg Oral Daily   Continuous Infusions:  PRN Meds: acetaminophen, hydrALAZINE   Vital Signs    Vitals:   12/13/19 1242 12/13/19 1926 12/13/19 2349 12/14/19 0341  BP: 132/86 (!) 129/56 133/77 140/61  Pulse: 68 65 62 64  Resp: 18 18 18 18   Temp: (!) 97.5 F (36.4 C) 98 F (36.7 C) 97.8 F (36.6 C) 98 F (36.7 C)  TempSrc: Oral Oral Oral Oral  SpO2: 95% 97% 98% 98%  Weight:    59.9 kg  Height:        Intake/Output Summary (Last 24 hours) at 12/14/2019 9326 Last data filed at 12/14/2019 0342 Gross per 24 hour  Intake --  Output 550 ml  Net -550 ml   Filed Weights   12/12/19 0435 12/13/19 0456 12/14/19 0341  Weight: 59.1 kg 60.1 kg 59.9 kg    Telemetry    NSR - Personally Reviewed  ECG    No new EKG to review - Personally Reviewed  Physical Exam   GEN: Well nourished, well developed in no acute distress HEENT: Normal NECK: No JVD; No carotid bruits LYMPHATICS: No lymphadenopathy CARDIAC:RRR, no murmurs, rubs, gallops RESPIRATORY:  Clear to auscultation without rales, wheezing or rhonchi  ABDOMEN: Soft, non-tender, non-distended MUSCULOSKELETAL:  No edema; No deformity  SKIN: Warm and dry NEUROLOGIC:  Alert and oriented x 3 PSYCHIATRIC:  Normal affect    Labs    Chemistry Recent Labs  Lab 12/10/19 1203 12/10/19 1900 12/11/19 0445  NA 139  --  136  K 3.9  --  3.7  CL 105  --  102  CO2 25  --  24  GLUCOSE 123*  --  108*  BUN 18  --  12  CREATININE 0.86 0.72 0.72  CALCIUM 9.4   --  9.1  PROT 6.7  --  6.5  ALBUMIN 3.5  --  3.3*  AST 23  --  27  ALT 16  --  19  ALKPHOS 47  --  54  BILITOT 0.9  --  1.3*  GFRNONAA 58* >60 >60  GFRAA >60 >60 >60  ANIONGAP 9  --  10     Hematology Recent Labs  Lab 12/10/19 1900 12/11/19 0445  WBC 9.7 8.5  RBC 4.18 4.28  HGB 13.1 13.3  HCT 39.8 39.8  MCV 95.2 93.0  MCH 31.3 31.1  MCHC 32.9 33.4  RDW 14.1 13.9  PLT 229 211    Cardiac EnzymesNo results for input(s): TROPONINI in the last 168 hours. No results for input(s): TROPIPOC in the last 168 hours.   BNPNo results for input(s): BNP, PROBNP in the last 168 hours.   DDimer No results for input(s): DDIMER in the last 168 hours.   Radiology    VAS US CAROTID  Result Date: 12/12/2019 Carotid Arterial Duplex Study Indications:       Syncope. Risk Factors:      Hypertension. Other Factors:     Atrial fibrillation,. Comparison  Study:  No prior study on file Performing Technologist: Sharion Dove RVS  Examination Guidelines: A complete evaluation includes B-mode imaging, spectral Doppler, color Doppler, and power Doppler as needed of all accessible portions of each vessel. Bilateral testing is considered an integral part of a complete examination. Limited examinations for reoccurring indications may be performed as noted.  Right Carotid Findings: +----------+--------+--------+--------+------------------+------------------+           PSV cm/sEDV cm/sStenosisPlaque DescriptionComments           +----------+--------+--------+--------+------------------+------------------+ CCA Prox  56      13                                intimal thickening +----------+--------+--------+--------+------------------+------------------+ CCA Distal68      12                                intimal thickening +----------+--------+--------+--------+------------------+------------------+ ICA Prox  59      9               heterogenous      tortuous            +----------+--------+--------+--------+------------------+------------------+ ICA Distal34      7                                 tortuous           +----------+--------+--------+--------+------------------+------------------+ ECA       49      6                                                    +----------+--------+--------+--------+------------------+------------------+ +----------+--------+-------+--------+-------------------+           PSV cm/sEDV cmsDescribeArm Pressure (mmHG) +----------+--------+-------+--------+-------------------+ [id number]                                         +----------+--------+-------+--------+-------------------+ +---------+--------+--+--------+-+ VertebralPSV cm/s49EDV cm/s7 +---------+--------+--+--------+-+  Left Carotid Findings: +----------+--------+--------+--------+------------------+------------------+           PSV cm/sEDV cm/sStenosisPlaque DescriptionComments           +----------+--------+--------+--------+------------------+------------------+ CCA Prox  56      13                                intimal thickening +----------+--------+--------+--------+------------------+------------------+ CCA Distal54      13                                intimal thickening +----------+--------+--------+--------+------------------+------------------+ ICA Prox  52      15              heterogenous      tortuous           +----------+--------+--------+--------+------------------+------------------+ ICA Mid   66      13                                                   +----------+--------+--------+--------+------------------+------------------+  ICA Distal33      9                                 tortuous           +----------+--------+--------+--------+------------------+------------------+ ECA       68      8                                                     +----------+--------+--------+--------+------------------+------------------+ +----------+--------+--------+--------+-------------------+           PSV cm/sEDV cm/sDescribeArm Pressure (mmHG) +----------+--------+--------+--------+-------------------+ Subclavian50                                          +----------+--------+--------+--------+-------------------+ +---------+--------+--+--------+-+ VertebralPSV cm/s40EDV cm/s3 +---------+--------+--+--------+-+   Summary: Right Carotid: The extracranial vessels were near-normal with only minimal wall                thickening or plaque. Left Carotid: The extracranial vessels were near-normal with only minimal wall               thickening or plaque. Vertebrals:  Bilateral vertebral arteries demonstrate antegrade flow. Subclavians: Normal flow hemodynamics were seen in bilateral subclavian              arteries. *See table(s) above for measurements and observations.  Electronically signed by Antony Contras MD on 12/12/2019 at 2:33:39 PM.    Final     Cardiac Studies   2D echo 9/30/20201 IMPRESSIONS    1. Left ventricular ejection fraction, by estimation, is 60 to 65%. The  left ventricle has normal function. The left ventricle has no regional  wall motion abnormalities. There is mild concentric left ventricular  hypertrophy. Left ventricular diastolic  parameters are consistent with Grade I diastolic dysfunction (impaired  relaxation).  2. Right ventricular systolic function is normal. The right ventricular  size is normal.  3. The mitral valve is normal in structure. Trivial mitral valve  regurgitation.  4. The aortic valve is tricuspid. There is mild calcification of the  aortic valve. There is mild thickening of the aortic valve. Aortic valve  regurgitation is trivial. Mild aortic valve sclerosis is present, with no  evidence of aortic valve stenosis.   Patient Profile     86 y.o. female with a hx of paroxysmal atrial  fibrillation, TIA, prior syncope, hypothyroidism, tremor and hyperlipidemia who is being seen for the evaluation of syncope at the request of Dr. Broadus John.  Assessment & Plan    1. Syncope>>Orthostatic Hypotension -Patient felt lightheaded prior to syncope otherwise, no prodromal symptoms.   -She denies chest pain, shortness of breath or palpitation.   -hsTroponin negative.   -EKG without acute ischemic changes.   -Telemetry shows sinus rhythm at rate of 60s. -2D echo with normal LVF and G1DD, trivial MR and mild AVSC with no AS -profoundly orthostatic on exam and symptomatic -now on thigh high compression hose and abdominal binder -orthostatic BPs improved. Supine 140/61mmHg, sitting 132/38mmHg, standing 132/51mmHg and Standing 3 min 122/89mmHg but still with 44mmHg drop in BP and symptomatic -she is still getting dizzy when standing up -midodrine and florinef not options due to baseline supine  HTN -add Mestinon 30mg  daily -repeat orthostatic Bps and walk in hall in am  2.  Paroxysmal atrial fibrillation -Maintaining sinus rhythm at rate of 50-60s but had 9 beat run of nonsustained atrial tachcyardia today on monitor - continue on Home Toprol-XL 12.5 mg qd -On Eliquis2.5mg  BID for anticoagulation -CHADSVASCs score of 6 for age, sex, HTN and TIA -patient wants to remain on a/c despite risks involved with syncopal episodes>>she will try to be very cautious with ambulation  3.  Accelerated hypertension -BP stable today at 140/35mmHg -continue amlodipine 5mg  daily and Toprol XL 12.5mg  daily    For questions or updates, please contact [place] Please consult [email or web address] for contact info under Cardiology/STEMI.      Signed, Fransico Him, MD  12/14/2019, 9:28 AM

## 2019-12-14 NOTE — Care Management (Signed)
Rollator ordered from [employer] for delivery to room prior to d/c.

## 2019-12-14 NOTE — Progress Notes (Signed)
PROGRESS NOTE    Christina Lawson  ZOX:[id number] DOB: 1924/01/09 DOA: 12/10/2019 PCP: Gayland Curry, DO  Brief Narrative: Christina Lawson is a 86 y.o. female with medical history significant of paroxysmal atrial fibrillation, hypothyroidism, essential hypertension, osteoporosis who is a resident of [facility] was in her usual state of health when she woke up 9/20 morning, went to use the bathroom and then found herself on the floor with a laceration on the back of her scalp. -After she woke up she had soreness in the back of her scalp and her left elbow -She denied any chest pain shortness of breath palpitations or dizziness ED Course: Work-up in the ED noted laceration on her posterior scalp and left elbow. -Overnight blood pressure in the 811-914 systolic range   Assessment & Plan:     Syncope and collapse Orthostatic hypotension Suspected autonomic neuropathy -EKG with Q waves in inferior leads which is unchanged from prior -2D echo unremarkable, TSH and troponins negative -Orthostatics significantly positive, no history suggestive of dehydration, given IV fluids for 12 hours after admission, supine blood pressure elevated however have avoided aggressive titration given much lower standing blood pressures and dizziness -Some improvement in symptoms noted, orthostatic still positive, suspect she may have autonomic neuropathy with advanced age -Still with some dizziness this morning however was able to ambulate better in the halls -Continue Toprol and Norvasc -Appreciate cardiology input starting Mestinon today -Continue TED hose, advised importance of adequate hydration, caution with position changes, physical therapy -Continue to increase activity as tolerated -Plan for discharge to assisted living with home health services tomorrow if stable   Accelerated hypertension Orthostatic hypotension -See discussion above, continue Toprol and started on  low-dose amlodipine -Avoid excessive titration of supine BP given positive orthostasis    PAF (paroxysmal atrial fibrillation) (HCC) -In sinus rhythm, continue Toprol and Eliquis  Scalp laceration Elbow skin tear -Status post multiple staples  -Wound care   DVT prophylaxis: Eliquis Code Status: DNR Family Communication: Daughter at bedside 10/1 Disposition Plan:  Status is: Inpatient Due to severity of illness   Dispo: The patient is from: ALF              Anticipated d/c is to: ALF              Anticipated d/c date is: Likely tomorrow if improving              Patient currently is not medically stable to d/c.  Consultants:   Cards   Procedures:   Antimicrobials:    Subjective: -Dizzy when she tried to stand up today but overall feeling a little better  Objective: Vitals:   12/13/19 1926 12/13/19 2349 12/14/19 0341 12/14/19 1304  BP: (!) 129/56 133/77 140/61 (!) 148/68  Pulse: 65 62 64   Resp: 18 18 18    Temp: 98 F (36.7 C) 97.8 F (36.6 C) 98 F (36.7 C)   TempSrc: Oral Oral Oral   SpO2: 97% 98% 98%   Weight:   59.9 kg   Height:        Intake/Output Summary (Last 24 hours) at 12/14/2019 1423 Last data filed at 12/14/2019 0342 Gross per 24 hour  Intake --  Output 550 ml  Net -550 ml   Filed Weights   12/12/19 0435 12/13/19 0456 12/14/19 0341  Weight: 59.1 kg 60.1 kg 59.9 kg    Examination:  General exam: Elderly frail chronically ill female sitting up in bed, AAOx3, no distress HEENT: Scalp  laceration with staples CVS: S1-S2, regular rate rhythm Lungs: Clear bilaterally Abdomen: Soft, nontender, bowel sounds present Extremities: No edema, left elbow skin tear Skin: As above  psychiatry: Judgement and insight appear normal. Mood & affect appropriate.     Data Reviewed:   CBC: Recent Labs  Lab 12/10/19 1900 12/11/19 0445  WBC 9.7 8.5  NEUTROABS 7.6  --   HGB 13.1 13.3  HCT 39.8 39.8  MCV 95.2 93.0  PLT 229 643   Basic Metabolic  Panel: Recent Labs  Lab 12/10/19 1203 12/10/19 1900 12/11/19 0445  NA 139  --  136  K 3.9  --  3.7  CL 105  --  102  CO2 25  --  24  GLUCOSE 123*  --  108*  BUN 18  --  12  CREATININE 0.86 0.72 0.72  CALCIUM 9.4  --  9.1   GFR: Estimated Creatinine Clearance: 34 mL/min (by C-G formula based on SCr of 0.72 mg/dL). Liver Function Tests: Recent Labs  Lab 12/10/19 1203 12/11/19 0445  AST 23 27  ALT 16 19  ALKPHOS 47 54  BILITOT 0.9 1.3*  PROT 6.7 6.5  ALBUMIN 3.5 3.3*   No results for input(s): LIPASE, AMYLASE in the last 168 hours. No results for input(s): AMMONIA in the last 168 hours. Coagulation Profile: No results for input(s): INR, PROTIME in the last 168 hours. Cardiac Enzymes: No results for input(s): CKTOTAL, CKMB, CKMBINDEX, TROPONINI in the last 168 hours. BNP (last 3 results) No results for input(s): PROBNP in the last 8760 hours. HbA1C: No results for input(s): HGBA1C in the last 72 hours. CBG: No results for input(s): GLUCAP in the last 168 hours. Lipid Profile: No results for input(s): CHOL, HDL, LDLCALC, TRIG, CHOLHDL, LDLDIRECT in the last 72 hours. Thyroid Function Tests: No results for input(s): TSH, T4TOTAL, FREET4, T3FREE, THYROIDAB in the last 72 hours. Anemia Panel: No results for input(s): VITAMINB12, FOLATE, FERRITIN, TIBC, IRON, RETICCTPCT in the last 72 hours. Urine analysis: No results found for: COLORURINE, APPEARANCEUR, LABSPEC, PHURINE, GLUCOSEU, HGBUR, BILIRUBINUR, KETONESUR, PROTEINUR, UROBILINOGEN, NITRITE, LEUKOCYTESUR Sepsis Labs: @LABRCNTIP (procalcitonin:4,lacticidven:4)  ) Recent Results (from the past 240 hour(s))  Respiratory Panel by RT PCR (Flu A&B, Covid) - Nasopharyngeal Swab     Status: None   Collection Time: 12/10/19  3:30 PM   Specimen: Nasopharyngeal Swab  Result Value Ref Range Status   SARS Coronavirus 2 by RT PCR NEGATIVE NEGATIVE Final    Comment: (NOTE) SARS-CoV-2 target nucleic acids are NOT  DETECTED.  The SARS-CoV-2 RNA is generally detectable in upper respiratoy specimens during the acute phase of infection. The lowest concentration of SARS-CoV-2 viral copies this assay can detect is 131 copies/mL. A negative result does not preclude SARS-Cov-2 infection and should not be used as the sole basis for treatment or other patient management decisions. A negative result may occur with  improper specimen collection/handling, submission of specimen other than nasopharyngeal swab, presence of viral mutation(s) within the areas targeted by this assay, and inadequate number of viral copies (<131 copies/mL). A negative result must be combined with clinical observations, patient history, and epidemiological information. The expected result is Negative.  Fact Sheet for Patients:  PinkCheek.be  Fact Sheet for Healthcare Providers:  GravelBags.it  This test is no t yet approved or cleared by the [place] FDA and  has been authorized for detection and/or diagnosis of SARS-CoV-2 by FDA under an Emergency Use Authorization (EUA). This EUA will remain  in effect (meaning this  test can be used) for the duration of the COVID-19 declaration under Section 564(b)(1) of the Act, 21 U.S.C. section 360bbb-3(b)(1), unless the authorization is terminated or revoked sooner.     Influenza A by PCR NEGATIVE NEGATIVE Final   Influenza B by PCR NEGATIVE NEGATIVE Final    Comment: (NOTE) The Xpert Xpress SARS-CoV-2/FLU/RSV assay is intended as an aid in  the diagnosis of influenza from Nasopharyngeal swab specimens and  should not be used as a sole basis for treatment. Nasal washings and  aspirates are unacceptable for Xpert Xpress SARS-CoV-2/FLU/RSV  testing.  Fact Sheet for Patients: PinkCheek.be  Fact Sheet for Healthcare Providers: GravelBags.it  This test is not yet  approved or cleared by the [place] FDA and  has been authorized for detection and/or diagnosis of SARS-CoV-2 by  FDA under an Emergency Use Authorization (EUA). This EUA will remain  in effect (meaning this test can be used) for the duration of the  Covid-19 declaration under Section 564(b)(1) of the Act, 21  U.S.C. section 360bbb-3(b)(1), unless the authorization is  terminated or revoked. Performed at [facility], [place] [address], [place], [place] [address]     Radiology Studies: No results found. Scheduled Meds: . amLODipine  5 mg Oral Daily  . apixaban  2.5 mg Oral BID  . fluticasone  2 spray Each Nare Daily  . fluticasone furoate-vilanterol  1 puff Inhalation Daily  . metoprolol succinate  12.5 mg Oral Daily  . pyridostigmine  30 mg Oral Daily  . sertraline  50 mg Oral Daily   Continuous Infusions:   LOS: 3 days   Time spent: 47min  Domenic Polite, MD [facility] 12/14/2019, 2:23 PM

## 2019-12-14 NOTE — Plan of Care (Signed)
  Problem: Health Behavior/Discharge Planning: Goal: Ability to manage health-related needs will improve Outcome: Progressing   Problem: Clinical Measurements: Goal: Ability to maintain clinical measurements within normal limits will improve Outcome: Progressing Goal: Cardiovascular complication will be avoided Outcome: Progressing   Problem: Activity: Goal: Risk for activity intolerance will decrease Outcome: Progressing

## 2019-12-15 DIAGNOSIS — I951 Orthostatic hypotension: Secondary | ICD-10-CM

## 2019-12-15 MED ORDER — AMLODIPINE BESYLATE 5 MG PO TABS
5.0000 mg | ORAL_TABLET | Freq: Every day | ORAL | 0 refills | Status: DC
Start: 2019-12-16 — End: 2021-12-26

## 2019-12-15 MED ORDER — PYRIDOSTIGMINE BROMIDE 60 MG PO TABS
30.0000 mg | ORAL_TABLET | Freq: Every day | ORAL | 0 refills | Status: DC
Start: 2019-12-16 — End: 2022-08-15

## 2019-12-15 NOTE — Plan of Care (Signed)

## 2019-12-15 NOTE — Progress Notes (Addendum)
   Update 10/4 4:22pm-CSW received call from Butch Penny with [facility] requesting patients nurse to call for report. Butch Penny gave CSW number for report.[phone]. CSW called patients nurse and spoke with bedside nurse who confirmed they will call and give report on patient.    Update 10/4 4:14pm-CSW confirmed with Butch Penny that facility communicated with daughter and that placement has been arranged.  Update 10/4 3:57pm-patients daughter returned [facility] phone call. Patients daughter confirmed that patient has arrived at [facility]. Patients daughter took her to [facility]. Patients daughter will call Butch Penny with [facility] to transfer patient over to SNF side. Patients daughter will call CSW to follow up to let her know patient has arrived at SNF side of [facility].    Plan was for patient to be discharged to [facility]. Patient was picked up by daughter and transferred to [facility] before we were able to send over discharge summary. CSW spoke with Butch Penny with [facility] and sent over DC  Summary and updated therapy notes. CSW attempted to call patients daughter to verify that patient has arrived at [facility]. CSW will keep trying to call patients daughter to verify that patient has arrived. Notified Surveyor, quantity Erasmo Downer of the above.

## 2019-12-15 NOTE — Progress Notes (Signed)
Progress Note  Patient Name: Christina Lawson Date of Encounter: 12/15/2019  [facility] Cardiologist: Elouise Munroe, MD   Subjective   Remains a little dizzy when upright.  Inpatient Medications    Scheduled Meds:  amLODipine  5 mg Oral Daily   apixaban  2.5 mg Oral BID   fluticasone  2 spray Each Nare Daily   fluticasone furoate-vilanterol  1 puff Inhalation Daily   metoprolol succinate  12.5 mg Oral Daily   pyridostigmine  30 mg Oral Daily   sertraline  50 mg Oral Daily   Continuous Infusions:   PRN Meds: acetaminophen   Vital Signs    Vitals:   12/13/19 2349 12/14/19 0341 12/14/19 1304 12/15/19 0457  BP: 133/77 140/61 (!) 148/68   Pulse: 62 64 69 65  Resp: 18 18 16 18   Temp: 97.8 F (36.6 C) 98 F (36.7 C) 97.9 F (36.6 C) 98.7 F (37.1 C)  TempSrc: Oral Oral Oral Oral  SpO2: 98% 98% 96% 96%  Weight:  59.9 kg  53.5 kg  Height:        Intake/Output Summary (Last 24 hours) at 12/15/2019 0835 Last data filed at 12/15/2019 0457 Gross per 24 hour  Intake --  Output 300 ml  Net -300 ml   Last 3 Weights 12/15/2019 12/14/2019 12/13/2019  Weight (lbs) 117 lb 15.1 oz 131 lb 15.8 oz 132 lb 6.4 oz  Weight (kg) 53.5 kg 59.87 kg 60.056 kg      Telemetry    NSR - Personally Reviewed  ECG    NSR, LAFB - Personally Reviewed  Physical Exam  Elderly, somewhat frail appearing GEN: No acute distress.   Neck: No JVD Cardiac: RRR, no murmurs, rubs, or gallops.  Respiratory: Clear to auscultation bilaterally. GI: Soft, nontender, non-distended  MS: No edema; No deformity. Neuro:  Nonfocal  Psych: Normal affect   Labs    High Sensitivity Troponin:   Recent Labs  Lab 12/10/19 1203 12/10/19 1900  TROPONINIHS 4 4      Chemistry Recent Labs  Lab 12/10/19 1203 12/10/19 1900 12/11/19 0445  NA 139  --  136  K 3.9  --  3.7  CL 105  --  102  CO2 25  --  24  GLUCOSE 123*  --  108*  BUN 18  --  12  CREATININE 0.86 0.72 0.72  CALCIUM 9.4  --   9.1  PROT 6.7  --  6.5  ALBUMIN 3.5  --  3.3*  AST 23  --  27  ALT 16  --  19  ALKPHOS 47  --  54  BILITOT 0.9  --  1.3*  GFRNONAA 58* >60 >60  GFRAA >60 >60 >60  ANIONGAP 9  --  10     Hematology Recent Labs  Lab 12/10/19 1900 12/11/19 0445  WBC 9.7 8.5  RBC 4.18 4.28  HGB 13.1 13.3  HCT 39.8 39.8  MCV 95.2 93.0  MCH 31.3 31.1  MCHC 32.9 33.4  RDW 14.1 13.9  PLT 229 211    BNPNo results for input(s): BNP, PROBNP in the last 168 hours.   DDimer No results for input(s): DDIMER in the last 168 hours.   Radiology    No results found.  Cardiac Studies   2D echo 9/30/20201   1. Left ventricular ejection fraction, by estimation, is 60 to 65%. The left ventricle has normal function. The left ventricle has no regional wall motion abnormalities. There is mild concentric left  ventricular  hypertrophy. Left ventricular diastolic parameters are consistent with Grade I diastolic dysfunction (impaired relaxation).   2. Right ventricular systolic function is normal. The right ventricular size is normal.   3. The mitral valve is normal in structure. Trivial mitral valve regurgitation.   4. The aortic valve is tricuspid. There is mild calcification of the aortic valve. There is mild thickening of the aortic valve. Aortic valve regurgitation is trivial. Mild aortic valve sclerosis is present, with no evidence of aortic valve stenosis.    Patient Profile     86 y.o. female with syncope and marked orthostatic hypotension, supine HTN, paroxysmal atrial fibrillation,  Assessment & Plan    1. Orthostatic hypotension: started on mestinon. Would permit SBP up to 656-812 mmHg. Beta blocker helpful for atrial arrhythmia, can stop or reduce amlodipine if needed. 2.  AFib: no recurrence while here. CHADSVasc 6 (age 38, gender, HTN, TIA 2). On lower dose apixaban adjusted for age and body size. No major structural cardiac abnormalities.     For questions or updates, please contact [place] Please consult [email or web address] for contact info under        Signed, Sanda Klein, MD  12/15/2019, 8:35 AM

## 2019-12-15 NOTE — Progress Notes (Signed)
F/u scheduled with Dr. Margaretann Loveless Monday Dec 29, 2019, appt info on AVS.

## 2019-12-15 NOTE — Progress Notes (Signed)
Discharge instructions including but not limited to meds, f/u appmts, diet, & self care given to pt. All questions were answered & information was verified via teachback method. All belongings gathered & sent w/ pt & her dgtr. Hoover Brunette, RN

## 2019-12-15 NOTE — Progress Notes (Signed)
Physical Therapy Treatment Patient Details Name: Christina Lawson MRN: [id number] DOB: 07/13/1923 Today's Date: 12/15/2019    History of Present Illness Christina Lawson is a 86 y.o. female with medical history significant of paroxysmal atrial fibrillation, hypothyroidism, essential hypertension, osteoporosis who is a resident of [facility] was in her usual state of health when she woke up this morning, went to use the bathroom and then found herself on the floor with a laceration on the back of her scalp.After she woke up she had soreness in the back of her scalp and her left elbow. She denied any chest pain shortness of breath palpitations or dizziness.  Dx with orthostatic hypotension, suspected autonomic neuropathy, accelerated HTN, PAF, scalp lac, elbow skin tear.      PT Comments    Patient progressing well towards PT goals. Reports feeling of lightheadedness still present but improved from she initially sat up. Tolerated gait training with use of rollator for support and Min guard assist for safety. Reports feeling of lightheadedness improved with ambulation. Pre activity BP 127/75, post activity BP 126/85. Reviewed how to lock/unlock brakes and safety with using rollator seat. Eager to return home today with follow up therapy. Will follow.   Follow Up Recommendations  Home health PT;Other (comment) (At [facility])     Equipment Recommendations  Other (comment) (4 wheeled walker- already in room)    Recommendations for Other Services       Precautions / Restrictions Precautions Precautions: Fall;Other (comment) Precaution Comments: monitor BP, HR Restrictions Weight Bearing Restrictions: No    Mobility  Bed Mobility               General bed mobility comments: Up in chair upon PT arrival.  Transfers Overall transfer level: Needs assistance Equipment used: 4-wheeled walker Transfers: Sit to/from Stand Sit to Stand: Supervision          General transfer comment: Supervision for safety. Stood from Youth worker.  Ambulation/Gait Ambulation/Gait assistance: Min guard Gait Distance (Feet): 130 Feet Assistive device: 4-wheeled walker Gait Pattern/deviations: Step-through pattern;Shuffle;Trunk flexed   Gait velocity interpretation: 1.31 - 2.62 ft/sec, indicative of limited community ambulator General Gait Details: Slow, mostly steady gait with rollator; cues on how to unlock/lock rollator brakes and how to safely sit. Mild lightheadedness which improved with walking.   Stairs             Wheelchair Mobility    Modified Rankin (Stroke Patients Only)       Balance Overall balance assessment: Needs assistance Sitting-balance support: Feet supported;No upper extremity supported Sitting balance-Leahy Scale: Fair Sitting balance - Comments: supervision   Standing balance support: During functional activity Standing balance-Leahy Scale: Poor Standing balance comment: needs external support from rollator                            Cognition Arousal/Alertness: Awake/alert Behavior During Therapy: WFL for tasks assessed/performed Overall Cognitive Status: Within Functional Limits for tasks assessed                                 General Comments: HOH      Exercises      General Comments General comments (skin integrity, edema, etc.): Pre activity BP 127/75, post activity BP 126/85l reports some lightheadedness which improves with walking this date.      Pertinent Vitals/Pain Pain Assessment: Faces Pain Location: head  Pain Descriptors / Indicators: Aching Pain Intervention(s): Monitored during session    Home Living                      Prior Function            PT Goals (current goals can now be found in the care plan section) Progress towards PT goals: Progressing toward goals    Frequency    Min 3X/week      PT Plan Current plan remains  appropriate    Co-evaluation              AM-PAC PT "6 Clicks" Mobility   Outcome Measure  Help needed turning from your back to your side while in a flat bed without using bedrails?: None Help needed moving from lying on your back to sitting on the side of a flat bed without using bedrails?: A Little Help needed moving to and from a bed to a chair (including a wheelchair)?: A Little Help needed standing up from a chair using your arms (e.g., wheelchair or bedside chair)?: A Little Help needed to walk in hospital room?: A Little Help needed climbing 3-5 steps with a railing? : A Little 6 Click Score: 19    End of Session Equipment Utilized During Treatment: Gait belt Activity Tolerance: Patient tolerated treatment well Patient left: in chair;with call bell/phone within reach;with chair alarm set Nurse Communication: Mobility status PT Visit Diagnosis: Unsteadiness on feet (R26.81);Other abnormalities of gait and mobility (R26.89);Dizziness and giddiness (R42);Difficulty in walking, not elsewhere classified (R26.2)     Time: 9833-8250 PT Time Calculation (min) (ACUTE ONLY): 16 min  Charges:  $Gait Training: 8-22 mins                     Marisa Severin, PT, DPT Acute Rehabilitation Services Pager [phone] Office [phone]       [place] 12/15/2019, 1:54 PM

## 2019-12-15 NOTE — Discharge Summary (Signed)
Physician Discharge Summary  Christina Lawson DGL:[id number] DOB: [birth year] DOA: 12/10/2019  PCP: Gayland Curry, DO  Admit date: 12/10/2019 Discharge date: 12/15/2019  Time spent: 35 minutes  Recommendations for Outpatient Follow-up:  1. Cardiology Dr. Margaretann Loveless on 10/18 2. Caution with position changes, home health PT, wear compression stockings 3. Avoid aggressive titration of supine or sitting blood pressures due to known orthostatic hypotension   Discharge Diagnoses:  Active Problems:   Syncope and collapse Orthostatic hypotension Suspected autonomic neuropathy AF (paroxysmal atrial fibrillation) ([place]) DO NOT RESUSCITATE  Discharge Condition: Improved  Diet recommendation: Low-sodium, plenty of fluids advised to stay well-hydrated  Filed Weights   12/13/19 0456 12/14/19 0341 12/15/19 0457  Weight: 60.1 kg 59.9 kg 53.5 kg    History of present illness:  Christina Lawson a 86 y.o.femalewith medical history significant ofparoxysmal atrial fibrillation, hypothyroidism, essential hypertension, osteoporosis who is a resident of [facility] was in her usual state of health when she woke up 9/20 morning, went to use the bathroom and then found herself on the floor with a laceration on the back of her scalp. -After she woke up she had soreness in the back of her scalp and her left elbow -She denied any chest pain shortness of breath palpitations or dizziness ED Course:Work-up in the ED noted laceration on her posterior scalp and left elbow., blood pressure in the 518-841 systolic range with positive orthostatics, blood pressure dropped to 120s when she stood up   Hospital Course:   Syncope and collapse Orthostatic hypotension Suspected autonomic neuropathy -EKG with Q waves in inferior leads which is unchanged from prior -2D echo unremarkable, TSH and troponins negative -Orthostatics significantly positive, no history suggestive of  dehydration, hydrated with IV fluids initially,, supine blood pressure was considerably elevated in the 1 90-200 range initially however we avoided excessive titration given much lower standing blood pressures and dizziness. -Clinically improving, low-dose Toprol continued and started on 5 mg of amlodipine -Orthostasis improved but did not resolve -Followed by cardiology this admission, started on low-dose Mestinon -Ambulating better at this time, was able to walk in the halls yesterday and today, which is minimal dizziness when she stands up initially -PT eval completed, she will be discharged back to assisted living with home health PT -Advised caution with position changes, encouraged her to use her walker, stay well-hydrated and wear TED hose/compression stockings during the day    Accelerated hypertension Orthostatic hypotension -See discussion above, continue Toprol and started on low-dose amlodipine -Avoid excessive titration of supine BP given positive orthostasis    PAF (paroxysmal atrial fibrillation) (HCC) -In sinus rhythm, continue Toprol and Eliquis  Scalp laceration Elbow skin tear -Status post multiple staples  -Wound care   Code Status: DNR  Consultations:  Cardiology  Discharge Exam: Vitals:   12/15/19 0847 12/15/19 0848  BP:  139/62  Pulse: 70   Resp:    Temp:    SpO2:      General: AAOx3 Cardiovascular: S1S2/RRR Respiratory: CTAB  Discharge Instructions   Discharge Instructions    Diet - low sodium heart healthy   Complete by: As directed    Drink plenty of fluids   Discharge instructions   Complete by: As directed    Take your time and use caution with position changes, wear compression stockings during the day and remove at night   Increase activity slowly   Complete by: As directed      Allergies as of 12/15/2019  Reactions   Clindamycin/lincomycin    Listed on MAR -- reaction unknown   Grass Pollen(k-o-r-t-swt Vern)    Listed on  MAR -- reaction unknown   Ibuprofen    Listed on MAR -- reaction unknown   Other    Preparation H Listed on MAR -- reaction unknown   Penicillins    Listed on MAR -- reaction unknown      Medication List    TAKE these medications   albuterol 108 (90 Base) MCG/ACT inhaler Commonly known as: VENTOLIN HFA Inhale into the lungs every 6 (six) hours as needed for wheezing.   amLODipine 5 MG tablet Commonly known as: NORVASC Take 1 tablet (5 mg total) by mouth daily. Start taking on: December 16, 2019   azelastine 0.1 % nasal spray Commonly known as: ASTELIN Place 1 spray into both nostrils 2 (two) times daily. Use in each nostril as directed   AZO BLADDER CONTROL/GO-LESS PO Take 300 mg by mouth 2 (two) times daily.   Breo Ellipta 100-25 MCG/INH Aepb Generic drug: fluticasone furoate-vilanterol Inhale 1 puff into the lungs every evening.   calcium carbonate 500 MG chewable tablet Commonly known as: TUMS - dosed in mg elemental calcium Chew 1 tablet by mouth daily as needed for indigestion.   Cholecalciferol 50 MCG (2000 UT) Caps Take 1 capsule by mouth every morning.   diclofenac Sodium 1 % Gel Commonly known as: VOLTAREN Apply topically as needed. MAR states: Dose: "Small amount", Frequency: "As needed", Instructions: "May keep at bedside and self administer"   docusate sodium 100 MG capsule Commonly known as: COLACE Take 100 mg by mouth daily.   Eliquis 2.5 MG Tabs tablet Generic drug: apixaban Take 2.5 mg by mouth 2 (two) times daily.   fluticasone 50 MCG/ACT nasal spray Commonly known as: FLONASE Place 2 sprays into both nostrils daily.   HYDROcodone-acetaminophen 5-325 MG tablet Commonly known as: NORCO/VICODIN Take 0.5 tablets by mouth every 6 (six) hours as needed for severe pain.   loratadine 10 MG tablet Commonly known as: CLARITIN Take 10 mg by mouth every morning.   metoprolol succinate 25 MG 24 hr tablet Commonly known as: TOPROL-XL Take 12.5 mg by  mouth daily.   oxymetazoline 0.05 % nasal spray Commonly known as: AFRIN Place 2 sprays into both nostrils 2 (two) times daily as needed for congestion.   PreserVision AREDS 2+Multi Vit Caps Take 1 capsule by mouth 2 (two) times daily.   pyridostigmine 60 MG tablet Commonly known as: MESTINON Take 0.5 tablets (30 mg total) by mouth daily. Start taking on: December 16, 2019   sertraline 50 MG tablet Commonly known as: ZOLOFT Take 50 mg by mouth daily.            Durable Medical Equipment  (From admission, onward)         Start     Ordered   12/14/19 1206  For home use only DME 4 wheeled rolling walker with seat  Once       Question:  Patient needs a walker to treat with the following condition  Answer:  Weakness   12/14/19 1206         Allergies  Allergen Reactions  . Clindamycin/Lincomycin     Listed on MAR -- reaction unknown  . Grass Pollen(K-O-R-T-Swt Vern)     Listed on MAR -- reaction unknown  . Ibuprofen     Listed on MAR -- reaction unknown  . Other     Preparation H Listed  on MAR -- reaction unknown  . Penicillins     Listed on MAR -- reaction unknown    Follow-up Information    Elouise Munroe, MD Follow up.   Specialties: Cardiology, Radiology Why: [facility] - see appointment as listed - Monday Dec 29, 2019 10:20 AM (Arrive by 10:05 AM). Contact information: [address] [place] [address] [phone]                The results of significant diagnostics from this hospitalization (including imaging, microbiology, ancillary and laboratory) are listed below for reference.    Significant Diagnostic Studies: DG Elbow Complete Left  Result Date: 12/10/2019 CLINICAL DATA:  Pain following fall EXAM: LEFT ELBOW - COMPLETE 3+ VIEW COMPARISON:  None. FINDINGS: Frontal, lateral, and bilateral oblique views were obtained. No fracture or dislocation. No joint effusion. Joint spaces appear normal. No erosive change.  IMPRESSION: No fracture or dislocation.  No evident arthropathy. Electronically Signed   By: Lowella Grip III M.D.   On: 12/10/2019 13:10   CT Head Wo Contrast  Result Date: 12/10/2019 CLINICAL DATA:  Head and neck trauma EXAM: CT HEAD WITHOUT CONTRAST CT CERVICAL SPINE WITHOUT CONTRAST TECHNIQUE: Multidetector CT imaging of the head and cervical spine was performed following the standard protocol without intravenous contrast. Multiplanar CT image reconstructions of the cervical spine were also generated. COMPARISON:  09/18/2017 CT head FINDINGS: CT HEAD FINDINGS Brain: Atrophy pattern and advanced white matter microvascular ischemic changes throughout both cerebral hemispheres. No acute intracranial hemorrhage, definite new infarction, large mass lesion, midline shift, herniation, hydrocephalus, or extra-axial fluid collection. No focal mass effect or edema. Cisterns are patent. No gross cerebellar abnormality. Vascular: No hyperdense vessel or unexpected calcification. Skull: Normal. Negative for fracture or focal lesion. Sinuses/Orbits: No orbital abnormality. Sinuses clear. Chronic right mastoid effusion. Other: None. CT CERVICAL SPINE FINDINGS Alignment: Normal. Skull base and vertebrae: No acute fracture. No primary bone lesion or focal pathologic process. Soft tissues and spinal canal: No prevertebral fluid or swelling. No visible canal hematoma. Disc levels: Advanced cervical degenerative changes spanning C3-C7. Ankylosis at C3-4 and C4-5 appearing chronic. C5-6 and C6-7 demonstrate marked disc space narrowing, sclerosis and osteophytes. Degenerative changes of the C1-2 articulation as well. Multilevel facet arthropathy.  No subluxation or dislocation. Upper chest: Visualized lung apices are clear. Aorta atherosclerotic. Other: None. IMPRESSION: Atrophy and advanced chronic white matter microvascular changes. No acute intracranial abnormality by noncontrast CT. Advanced cervical degenerative changes  spanning C3-C7. No acute osseous finding, fracture, malalignment by CT. Electronically Signed   By: Jerilynn Mages.  Shick M.D.   On: 12/10/2019 13:26   CT Cervical Spine Wo Contrast  Result Date: 12/10/2019 CLINICAL DATA:  Head and neck trauma EXAM: CT HEAD WITHOUT CONTRAST CT CERVICAL SPINE WITHOUT CONTRAST TECHNIQUE: Multidetector CT imaging of the head and cervical spine was performed following the standard protocol without intravenous contrast. Multiplanar CT image reconstructions of the cervical spine were also generated. COMPARISON:  09/18/2017 CT head FINDINGS: CT HEAD FINDINGS Brain: Atrophy pattern and advanced white matter microvascular ischemic changes throughout both cerebral hemispheres. No acute intracranial hemorrhage, definite new infarction, large mass lesion, midline shift, herniation, hydrocephalus, or extra-axial fluid collection. No focal mass effect or edema. Cisterns are patent. No gross cerebellar abnormality. Vascular: No hyperdense vessel or unexpected calcification. Skull: Normal. Negative for fracture or focal lesion. Sinuses/Orbits: No orbital abnormality. Sinuses clear. Chronic right mastoid effusion. Other: None. CT CERVICAL SPINE FINDINGS Alignment: Normal. Skull base and vertebrae: No  acute fracture. No primary bone lesion or focal pathologic process. Soft tissues and spinal canal: No prevertebral fluid or swelling. No visible canal hematoma. Disc levels: Advanced cervical degenerative changes spanning C3-C7. Ankylosis at C3-4 and C4-5 appearing chronic. C5-6 and C6-7 demonstrate marked disc space narrowing, sclerosis and osteophytes. Degenerative changes of the C1-2 articulation as well. Multilevel facet arthropathy.  No subluxation or dislocation. Upper chest: Visualized lung apices are clear. Aorta atherosclerotic. Other: None. IMPRESSION: Atrophy and advanced chronic white matter microvascular changes. No acute intracranial abnormality by noncontrast CT. Advanced cervical degenerative  changes spanning C3-C7. No acute osseous finding, fracture, malalignment by CT. Electronically Signed   By: Jerilynn Mages.  Shick M.D.   On: 12/10/2019 13:26   ECHOCARDIOGRAM COMPLETE  Result Date: 12/11/2019    ECHOCARDIOGRAM REPORT   Patient Name:   Christina Lawson Date of Exam: 12/11/2019 Medical Rec #:  [id number]          Height:       62.0 in Accession #:    [id number]         Weight:       120.0 lb Date of Birth:  1923/09/11           BSA:          1.539 m Patient Age:    77 years           BP:           131/107 mmHg Patient Gender: F                  HR:           61 bpm. Exam Location:  Inpatient Procedure: 2D Echo Indications:    syncope 780.2  History:        Patient has no prior history of Echocardiogram examinations.                 Arrythmias:paroxysmal atrial fibrillation.  Sonographer:    Johny Chess Referring Phys: [place]  1. Left ventricular ejection fraction, by estimation, is 60 to 65%. The left ventricle has normal function. The left ventricle has no regional wall motion abnormalities. There is mild concentric left ventricular hypertrophy. Left ventricular diastolic parameters are consistent with Grade I diastolic dysfunction (impaired relaxation).  2. Right ventricular systolic function is normal. The right ventricular size is normal.  3. The mitral valve is normal in structure. Trivial mitral valve regurgitation.  4. The aortic valve is tricuspid. There is mild calcification of the aortic valve. There is mild thickening of the aortic valve. Aortic valve regurgitation is trivial. Mild aortic valve sclerosis is present, with no evidence of aortic valve stenosis. Comparison(s): No prior Echocardiogram. FINDINGS  Left Ventricle: Left ventricular ejection fraction, by estimation, is 60 to 65%. The left ventricle has normal function. The left ventricle has no regional wall motion abnormalities. The left ventricular internal cavity size was normal in size. There is  mild  concentric left ventricular hypertrophy. Left ventricular diastolic parameters are consistent with Grade I diastolic dysfunction (impaired relaxation). The E/e' is >14 suggestive of elevated LAP. Right Ventricle: The right ventricular size is normal. Right vetricular wall thickness was not assessed. Right ventricular systolic function is normal. Left Atrium: Left atrial size was normal in size. Right Atrium: Right atrial size was not well visualized. Pericardium: There is no evidence of pericardial effusion. Mitral Valve: There is dense, focal calcification of the posterior subvalvular apparatus. The mitral valve is normal in structure. There  is mild thickening of the mitral valve leaflet(s). There is mild calcification of the mitral valve leaflet(s). Mild to moderate mitral annular calcification. Trivial mitral valve regurgitation. Tricuspid Valve: The tricuspid valve is normal in structure. Tricuspid valve regurgitation is trivial. Aortic Valve: The aortic valve is tricuspid. There is mild calcification of the aortic valve. There is mild thickening of the aortic valve. Aortic valve regurgitation is trivial. Mild aortic valve sclerosis is present, with no evidence of aortic valve stenosis. Pulmonic Valve: The pulmonic valve was normal in structure. Pulmonic valve regurgitation is mild. Aorta: The aortic root is normal in size and structure. IAS/Shunts: The atrial septum is grossly normal.  LEFT VENTRICLE PLAX 2D LVIDd:         3.80 cm  Diastology LVIDs:         2.30 cm  LV e' medial:    4.79 cm/s LV PW:         1.10 cm  LV E/e' medial:  17.2 LV IVS:        1.00 cm  LV e' lateral:   5.11 cm/s LVOT diam:     1.80 cm  LV E/e' lateral: 16.1 LV SV:         64 LV SV Index:   42 LVOT Area:     2.54 cm  RIGHT VENTRICLE             IVC RV S prime:     15.70 cm/s  IVC diam: 1.00 cm LEFT ATRIUM             Index LA diam:        2.70 cm 1.75 cm/m LA Vol (A2C):   37.2 ml 24.18 ml/m LA Vol (A4C):   49.3 ml 32.04 ml/m LA  Biplane Vol: 43.6 ml 28.34 ml/m  AORTIC VALVE LVOT Vmax:   90.70 cm/s LVOT Vmean:  56.700 cm/s LVOT VTI:    0.252 m  AORTA Ao Root diam: 3.00 cm Ao Asc diam:  3.10 cm MITRAL VALVE MV Area (PHT): 2.21 cm     SHUNTS MV Decel Time: 343 msec     Systemic VTI:  0.25 m MV E velocity: 82.50 cm/s   Systemic Diam: 1.80 cm MV A velocity: 143.00 cm/s MV E/A ratio:  0.58 Gwyndolyn Kaufman MD Electronically signed by Gwyndolyn Kaufman MD Signature Date/Time: 12/11/2019/3:45:30 PM    Final    VAS US CAROTID  Result Date: 12/12/2019 Carotid Arterial Duplex Study Indications:       Syncope. Risk Factors:      Hypertension. Other Factors:     Atrial fibrillation,. Comparison Study:  No prior study on file Performing Technologist: Sharion Dove RVS  Examination Guidelines: A complete evaluation includes B-mode imaging, spectral Doppler, color Doppler, and power Doppler as needed of all accessible portions of each vessel. Bilateral testing is considered an integral part of a complete examination. Limited examinations for reoccurring indications may be performed as noted.  Right Carotid Findings: +----------+--------+--------+--------+------------------+------------------+           PSV cm/sEDV cm/sStenosisPlaque DescriptionComments           +----------+--------+--------+--------+------------------+------------------+ CCA Prox  56      13                                intimal thickening +----------+--------+--------+--------+------------------+------------------+ CCA Distal68      12  intimal thickening +----------+--------+--------+--------+------------------+------------------+ ICA Prox  59      9               heterogenous      tortuous           +----------+--------+--------+--------+------------------+------------------+ ICA Distal34      7                                 tortuous            +----------+--------+--------+--------+------------------+------------------+ ECA       49      6                                                    +----------+--------+--------+--------+------------------+------------------+ +----------+--------+-------+--------+-------------------+           PSV cm/sEDV cmsDescribeArm Pressure (mmHG) +----------+--------+-------+--------+-------------------+ [id number]                                         +----------+--------+-------+--------+-------------------+ +---------+--------+--+--------+-+ VertebralPSV cm/s49EDV cm/s7 +---------+--------+--+--------+-+  Left Carotid Findings: +----------+--------+--------+--------+------------------+------------------+           PSV cm/sEDV cm/sStenosisPlaque DescriptionComments           +----------+--------+--------+--------+------------------+------------------+ CCA Prox  56      13                                intimal thickening +----------+--------+--------+--------+------------------+------------------+ CCA Distal54      13                                intimal thickening +----------+--------+--------+--------+------------------+------------------+ ICA Prox  52      15              heterogenous      tortuous           +----------+--------+--------+--------+------------------+------------------+ ICA Mid   66      13                                                   +----------+--------+--------+--------+------------------+------------------+ ICA Distal33      9                                 tortuous           +----------+--------+--------+--------+------------------+------------------+ ECA       68      8                                                    +----------+--------+--------+--------+------------------+------------------+ +----------+--------+--------+--------+-------------------+           PSV cm/sEDV cm/sDescribeArm Pressure (mmHG)  +----------+--------+--------+--------+-------------------+ Subclavian50                                          +----------+--------+--------+--------+-------------------+ +---------+--------+--+--------+-+  VertebralPSV cm/s40EDV cm/s3 +---------+--------+--+--------+-+   Summary: Right Carotid: The extracranial vessels were near-normal with only minimal wall                thickening or plaque. Left Carotid: The extracranial vessels were near-normal with only minimal wall               thickening or plaque. Vertebrals:  Bilateral vertebral arteries demonstrate antegrade flow. Subclavians: Normal flow hemodynamics were seen in bilateral subclavian              arteries. *See table(s) above for measurements and observations.  Electronically signed by Antony Contras MD on 12/12/2019 at 2:33:39 PM.    Final     Microbiology: Recent Results (from the past 240 hour(s))  Respiratory Panel by RT PCR (Flu A&B, Covid) - Nasopharyngeal Swab     Status: None   Collection Time: 12/10/19  3:30 PM   Specimen: Nasopharyngeal Swab  Result Value Ref Range Status   SARS Coronavirus 2 by RT PCR NEGATIVE NEGATIVE Final    Comment: (NOTE) SARS-CoV-2 target nucleic acids are NOT DETECTED.  The SARS-CoV-2 RNA is generally detectable in upper respiratoy specimens during the acute phase of infection. The lowest concentration of SARS-CoV-2 viral copies this assay can detect is 131 copies/mL. A negative result does not preclude SARS-Cov-2 infection and should not be used as the sole basis for treatment or other patient management decisions. A negative result may occur with  improper specimen collection/handling, submission of specimen other than nasopharyngeal swab, presence of viral mutation(s) within the areas targeted by this assay, and inadequate number of viral copies (<131 copies/mL). A negative result must be combined with clinical observations, patient history, and epidemiological information.  The expected result is Negative.  Fact Sheet for Patients:  PinkCheek.be  Fact Sheet for Healthcare Providers:  GravelBags.it  This test is no t yet approved or cleared by the [place] FDA and  has been authorized for detection and/or diagnosis of SARS-CoV-2 by FDA under an Emergency Use Authorization (EUA). This EUA will remain  in effect (meaning this test can be used) for the duration of the COVID-19 declaration under Section 564(b)(1) of the Act, 21 U.S.C. section 360bbb-3(b)(1), unless the authorization is terminated or revoked sooner.     Influenza A by PCR NEGATIVE NEGATIVE Final   Influenza B by PCR NEGATIVE NEGATIVE Final    Comment: (NOTE) The Xpert Xpress SARS-CoV-2/FLU/RSV assay is intended as an aid in  the diagnosis of influenza from Nasopharyngeal swab specimens and  should not be used as a sole basis for treatment. Nasal washings and  aspirates are unacceptable for Xpert Xpress SARS-CoV-2/FLU/RSV  testing.  Fact Sheet for Patients: PinkCheek.be  Fact Sheet for Healthcare Providers: GravelBags.it  This test is not yet approved or cleared by the [place] FDA and  has been authorized for detection and/or diagnosis of SARS-CoV-2 by  FDA under an Emergency Use Authorization (EUA). This EUA will remain  in effect (meaning this test can be used) for the duration of the  Covid-19 declaration under Section 564(b)(1) of the Act, 21  U.S.C. section 360bbb-3(b)(1), unless the authorization is  terminated or revoked. Performed at [facility], [place] [address], [place], [place] [address]      Labs: Basic Metabolic Panel: Recent Labs  Lab 12/10/19 1203 12/10/19 1900 12/11/19 0445  NA 139  --  136  K 3.9  --  3.7  CL 105  --  102  CO2  25  --  24  GLUCOSE 123*  --  108*  BUN 18  --  12  CREATININE 0.86 0.72 0.72  CALCIUM 9.4  --   9.1   Liver Function Tests: Recent Labs  Lab 12/10/19 1203 12/11/19 0445  AST 23 27  ALT 16 19  ALKPHOS 47 54  BILITOT 0.9 1.3*  PROT 6.7 6.5  ALBUMIN 3.5 3.3*   No results for input(s): LIPASE, AMYLASE in the last 168 hours. No results for input(s): AMMONIA in the last 168 hours. CBC: Recent Labs  Lab 12/10/19 1900 12/11/19 0445  WBC 9.7 8.5  NEUTROABS 7.6  --   HGB 13.1 13.3  HCT 39.8 39.8  MCV 95.2 93.0  PLT 229 211   Cardiac Enzymes: No results for input(s): CKTOTAL, CKMB, CKMBINDEX, TROPONINI in the last 168 hours. BNP: BNP (last 3 results) No results for input(s): BNP in the last 8760 hours.  ProBNP (last 3 results) No results for input(s): PROBNP in the last 8760 hours.  CBG: No results for input(s): GLUCAP in the last 168 hours.     Signed:  Domenic Polite MD.  [facility] 12/15/2019, 11:04 AM

## 2019-12-16 ENCOUNTER — Other Ambulatory Visit: Payer: Self-pay | Admitting: Internal Medicine

## 2019-12-16 ENCOUNTER — Encounter: Payer: Self-pay | Admitting: Internal Medicine

## 2019-12-16 ENCOUNTER — Non-Acute Institutional Stay (SKILLED_NURSING_FACILITY): Payer: Medicare Other | Admitting: Internal Medicine

## 2019-12-16 DIAGNOSIS — N3946 Mixed incontinence: Secondary | ICD-10-CM

## 2019-12-16 DIAGNOSIS — G20C Parkinsonism, unspecified: Secondary | ICD-10-CM

## 2019-12-16 DIAGNOSIS — I951 Orthostatic hypotension: Secondary | ICD-10-CM

## 2019-12-16 DIAGNOSIS — F419 Anxiety disorder, unspecified: Secondary | ICD-10-CM | POA: Diagnosis not present

## 2019-12-16 DIAGNOSIS — I10 Essential (primary) hypertension: Secondary | ICD-10-CM

## 2019-12-16 DIAGNOSIS — I480 Paroxysmal atrial fibrillation: Secondary | ICD-10-CM

## 2019-12-16 DIAGNOSIS — J45909 Unspecified asthma, uncomplicated: Secondary | ICD-10-CM

## 2019-12-16 DIAGNOSIS — R55 Syncope and collapse: Secondary | ICD-10-CM | POA: Diagnosis not present

## 2019-12-16 DIAGNOSIS — G20 Parkinson's disease: Secondary | ICD-10-CM | POA: Diagnosis not present

## 2019-12-16 DIAGNOSIS — G3184 Mild cognitive impairment, so stated: Secondary | ICD-10-CM

## 2019-12-16 DIAGNOSIS — Z66 Do not resuscitate: Secondary | ICD-10-CM

## 2019-12-16 MED ORDER — HYDROCODONE-ACETAMINOPHEN 5-325 MG PO TABS: 0.5000 | ORAL_TABLET | Freq: Four times a day (QID) | ORAL | 0 refills | Status: DC | PRN

## 2019-12-16 NOTE — Progress Notes (Signed)
Provider:  Gwenith Spitz. Renato Gails, D.O., C.M.D. Location:  [facility] Room Number: 152 Place of Service:  SNF (31)  PCP: Kermit Balo, DO Patient Care Team: Kermit Balo, DO as PCP - General (Geriatric Medicine) Parke Poisson, MD as PCP - Cardiology (Cardiology) [facility] Clance, Maree Krabbe, MD as Consulting Physician (Pulmonary Disease) Daleen Squibb, Jesse Sans, MD (Inactive) as Consulting Physician (Cardiology) Ranee Gosselin, MD as Consulting Physician (Orthopedic Surgery) York Spaniel, MD as Consulting Physician (Neurology) Haverstock, Elvin So, MD as Referring Physician (Dermatology) Baxter Hire, MD (Inactive) as Consulting Physician (Allergy and Immunology) Mckinley Jewel, MD as Consulting Physician (Ophthalmology) Huel Cote, MD as Consulting Physician (Obstetrics and Gynecology) Ermalinda Barrios, MD as Consulting Physician (Otolaryngology) Landry Mellow, AUD (Audiology) Aneta Mins, CCC-A (Audiology) Kermit Balo, DO (Geriatric Medicine) Parke Poisson, MD (Cardiology)  Extended Emergency Contact Information Primary Emergency Contact: Forestine Chute Home Phone: [phone] Relation: Daughter Secondary Emergency Contact: Rossi,Lynn Address: [address]          [place] [phone] D[place] of [place] Home Phone: [phone] Mobile Phone: [phone] Relation: Daughter  Code Status: DNR Goals of Care: Advanced Directive information Advanced Directives 12/16/2019  Does Patient Have a Medical Advance Directive? Yes  Type of Advance Directive Out of facility DNR (pink MOST or yellow form);Living will  Does patient want to make changes to medical advance directive? No - Patient declined  Copy of Healthcare Power of Attorney in Chart? -  Pre-existing out of facility DNR order (yellow form or pink MOST form) Pink MOST/Yellow Form most recent copy in chart - Physician notified to  receive inpatient order   Chief Complaint  Patient presents with  . New Admit To SNF    Rehab admission s/p fall with head laceration, episodic lightheadedness and head laceration    HPI: Patient is a 86 y.o. female seen today for admission to [facility] s/p fall with head laceration, episodic lightheadedness.  Christina Lawson has a h/o essential tremor and some slowing of her gait over the years (has had workup for PD with neurology), HTN, allergies, anxiety, urinary incontinence, macular degeneration, and moderate persistent asthma.  She moved to [place] recently due to mild cognitive impairment and declining vision and hearing, slowing mobility.  She has done well until she sustained this fall when she walked into her bathroom.  In the emergency dept, she was noted to be quite hypertensive and orthostatic with BP gong from 195-202 systolic and then dropping to 253G upon standing.  She had a posterior scalp laceration that required staples that are now due for removal.  At the hospital, amlodipine was added for the htn, then low dose mestinon 0.5mg  daily for her orthostasis felt to be autonomic dysfunction.  She was given compression hose and an abdominal binder and advised to change positions slowly.  She is using a rollator walker and getting PT.    While here, she was still very symptomatic with positional changes and moving very very slowly yesterday, but starting to feel a bit better today.  Her abdominal binder had slid up while seated covering her entire upper body with her petite stature.    Orthostatics today:  150/83 lying to 140/70 sitting- dizzy to 122/62 standing-not dizzy.  She has not gotten her mestinon here yet b/c it had not come from pharmacy.    She is eager to get back to her walking and CNA was going to walk her around  the unit.    Her daughter also arrived to visit and bring her some items from her apt.  Past Medical History:  Diagnosis Date  . Allergic rhinitis     pollen and grass  . Asthma   . Atrial fibrillation (HCC) 05/13/2018  . Atrial fibrillation (HCC)   . Blepharitis   . Carpal tunnel syndrome, bilateral 07/03/2016  . Family history of adverse reaction to anesthesia    " MY FIRST COUNSINS CHILD (24YR OLD ) DIED UNDER ANESTHESTIA   . High cholesterol   . HOH (hard of hearing)   . Hyperlipidemia   . Irregular heart rhythm   . Macular degeneration 2012  . Osteoporosis   . Sensorineural hearing loss, bilateral 2007  . Shingles 2004  . Skin cancer 2005   nose  . Syncope and collapse 12/11/2019  . TIA (transient ischemic attack)   . Torn meniscus 2006   right knee  . Tremor 2006   familiar.  dx by Dr. Hedy Camara  . Unspecified essential hypertension   . Unspecified hypothyroidism   . Urinary frequency    Past Surgical History:  Procedure Laterality Date  . ABDOMINAL HYSTERECTOMY  1977  . ABDOMINAL HYSTERECTOMY    . CATARACT EXTRACTION  1974   Left, IOL implants  . CATARACT EXTRACTION Right 1979   IOL implants  . CHOLECYSTECTOMY  1981  . CHOLECYSTECTOMY    . COSMETIC SURGERY  1995   eye  . CYSTOCELE REPAIR  1993  . DILATION AND CURETTAGE OF UTERUS  1977  . EYE SURGERY    . GALLBLADDER SURGERY  1981  . HIATAL HERNIA REPAIR  1977  . SKIN CANCER EXCISION  2005   nose  . THYROIDECTOMY  1955  . TONSILLECTOMY  1931  . TONSILLECTOMY      Social History   Socioeconomic History  . Marital status: Single    Spouse name: Not on file  . Number of children: 2  . Years of education: college  . Highest education level: Not on file  Occupational History  . Occupation: retired Child psychotherapist    Comment: retired  Tobacco Use  . Smoking status: Never Smoker  . Smokeless tobacco: Never Used  Vaping Use  . Vaping Use: Never used  Substance and Sexual Activity  . Alcohol use: Not Currently    Comment: on occ.  . Drug use: Never  . Sexual activity: Not Currently  Other Topics Concern  . Not on file  Social History Narrative    ** Merged History Encounter **       Lives at [facility]. Married 1958. Never smoked Alcohol minimal Exercise predominantly walking, water aerobic Retired Child psychotherapist     Social Determinants of Corporate investment banker Strain:   . Difficulty of Paying Living Expenses: Not on file  Food Insecurity:   . Worried About Programme researcher, broadcasting/film/video in the Last Year: Not on file  . Ran Out of Food in the Last Year: Not on file  Transportation Needs:   . Lack of Transportation (Medical): Not on file  . Lack of Transportation (Non-Medical): Not on file  Physical Activity:   . Days of Exercise per Week: Not on file  . Minutes of Exercise per Session: Not on file  Stress:   . Feeling of Stress : Not on file  Social Connections:   . Frequency of Communication with Friends and Family: Not on file  . Frequency of Social Gatherings with Friends  and Family: Not on file  . Attends Religious Services: Not on file  . Active Member of Clubs or Organizations: Not on file  . Attends Banker Meetings: Not on file  . Marital Status: Not on file    reports that she has never smoked. She has never used smokeless tobacco. She reports previous alcohol use. She reports that she does not use drugs.  Functional Status Survey:    Family History  Problem Relation Age of Onset  . Emphysema Father   . Allergies Father   . Asthma Father   . Brain cancer Mother   . Cancer Mother        Brain  . Hypertension Mother   . Cancer Sister        Cervical  . Endometrial cancer Sister   . Breast cancer Sister 6  . Cancer Sister        Breast, metastasized to bone  . Breast cancer Sister        Lind Covert of age  . Breast cancer Daughter 24  . Heart disease Maternal Grandfather   . Stroke Other   . Breast cancer Maternal Aunt   . Breast cancer Paternal Aunt   . Breast cancer Cousin   . Breast cancer Maternal Aunt   . Breast cancer Cousin     Health Maintenance  Topic Date Due    . INFLUENZA VACCINE  10/12/2019  . TETANUS/TDAP  04/07/2025  . DEXA SCAN  Completed  . COVID-19 Vaccine  Completed  . PNA vac Low Risk Adult  Completed    Allergies  Allergen Reactions  . Clindamycin/Lincomycin Diarrhea    diarrhea  . Clindamycin/Lincomycin     Listed on MAR -- reaction unknown  . Grass Pollen(K-O-[id number]-T-Swt Vern)     Listed on MAR -- reaction unknown  . Ibuprofen Other (See Comments)    Long time ago/ list on paper work  . Ibuprofen     Listed on MAR -- reaction unknown  . Other     Preparation H Listed on MAR -- reaction unknown  . Penicillins Other (See Comments)    Did it involve swelling of the face/tongue/throat, SOB, or low BP? Unknown Did it involve sudden or severe rash/hives, skin peeling, or any reaction on the inside of your mouth or nose? Unknown Did you need to seek medical attention at a hospital or doctor's office? Unknown When did it last happen? If all above answers are "NO", may proceed with cephalosporin use.  Marland Kitchen Penicillins     Listed on MAR -- reaction unknown    Outpatient Encounter Medications as of 12/16/2019  Medication Sig  . albuterol (VENTOLIN HFA) 108 (90 Base) MCG/ACT inhaler Inhale into the lungs every 6 (six) hours as needed for wheezing.  Marland Kitchen amLODipine (NORVASC) 5 MG tablet Take 1 tablet (5 mg total) by mouth daily.  Marland Kitchen apixaban (ELIQUIS) 2.5 MG TABS tablet Take 2.5 mg by mouth 2 (two) times daily.  Marland Kitchen azelastine (ASTELIN) 0.1 % nasal spray Place 1 spray into both nostrils 2 (two) times daily. Use in each nostril as directed  . BREO ELLIPTA 100-25 MCG/INH AEPB Inhale 1 puff into the lungs every evening.   . calcium carbonate (TUMS - DOSED IN MG ELEMENTAL CALCIUM) 500 MG chewable tablet Chew 1 tablet by mouth daily as needed for indigestion.  . Cholecalciferol (VITAMIN D) 2000 units CAPS Take 1 capsule (2,000 Units total) by mouth daily.  . diclofenac Sodium (VOLTAREN) 1 % GEL Apply  topically as needed. MAR states: Dose:  "Small amount", Frequency: "As needed", Instructions: "May keep at bedside and self administer"  . docusate sodium (COLACE) 100 MG capsule Take 100 mg by mouth daily.  . fluticasone (FLONASE) 50 MCG/ACT nasal spray Place 2 sprays into both nostrils daily.  Marland Kitchen loratadine (CLARITIN) 10 MG tablet Take 1 tablet (10 mg total) by mouth daily.  . metoprolol succinate (TOPROL-XL) 25 MG 24 hr tablet Take 12.5 mg by mouth daily.  . Multiple Vitamins-Minerals (PRESERVISION AREDS 2+MULTI VIT) CAPS Take 1 capsule by mouth 2 (two) times daily.  Marland Kitchen oxymetazoline (AFRIN) 0.05 % nasal spray Place 2 sprays into both nostrils 2 (two) times daily as needed for congestion.   . Pumpkin Seed-Soy Germ (AZO BLADDER CONTROL/GO-LESS PO) Take 300 mg by mouth 2 (two) times daily.  Marland Kitchen pyridostigmine (MESTINON) 60 MG tablet Take 0.5 tablets (30 mg total) by mouth daily.  . sertraline (ZOLOFT) 50 MG tablet Take 50 mg by mouth daily.  . [DISCONTINUED] albuterol (PROVENTIL HFA;VENTOLIN HFA) 108 (90 Base) MCG/ACT inhaler Inhale 1 puff into the lungs every 6 (six) hours as needed for wheezing.   . [DISCONTINUED] HYDROcodone-acetaminophen (NORCO/VICODIN) 5-325 MG tablet Take 0.5 tablets by mouth every 6 (six) hours as needed for severe pain.  . [DISCONTINUED] loratadine (CLARITIN) 10 MG tablet Take 10 mg by mouth every morning.  . [DISCONTINUED] apixaban (ELIQUIS) 2.5 MG TABS tablet Take 1 tablet (2.5 mg total) by mouth 2 (two) times daily.  . [DISCONTINUED] Azelastine HCl 0.15 % SOLN USE 1 SPRAY NASALLY TWICE A DAY  . [DISCONTINUED] calcium carbonate (TUMS - DOSED IN MG ELEMENTAL CALCIUM) 500 MG chewable tablet Chew 1 tablet by mouth daily as needed for indigestion.  . [DISCONTINUED] Cholecalciferol 50 MCG (2000 UT) CAPS Take 1 capsule by mouth every morning.  . [DISCONTINUED] diclofenac (VOLTAREN) 0.1 % ophthalmic solution 4 (four) times daily.  . [DISCONTINUED] Diclofenac Sodium 1 % CREA Place 1 application onto the skin as needed.   . [DISCONTINUED] docusate sodium (COLACE) 100 MG capsule Take 100 mg by mouth daily.    . [DISCONTINUED] fluticasone furoate-vilanterol (BREO ELLIPTA) 200-25 MCG/INH AEPB Inhale 1 puff into the lungs daily.  . [DISCONTINUED] HYDROcodone-acetaminophen (NORCO/VICODIN) 5-325 MG tablet Take 1 tablet by mouth daily as needed for severe pain (in back).  . [DISCONTINUED] metoprolol succinate (TOPROL-XL) 25 MG 24 hr tablet Take 0.5 tablets (12.5 mg total) by mouth daily.  . [DISCONTINUED] minoxidil (ROGAINE) 2 % external solution Apply topically 2 (two) times daily.  . [DISCONTINUED] Multiple Vitamins-Minerals (PRESERVISION AREDS PO) Take 1 tablet by mouth 2 (two) times daily.  . [DISCONTINUED] oxymetazoline (AFRIN) 0.05 % nasal spray Place 1 spray into both nostrils 2 (two) times daily.  . [DISCONTINUED] Propylene Glycol (SYSTANE COMPLETE OP) Apply to eye.  . [DISCONTINUED] Pumpkin Seed-Soy Germ (AZO BLADDER CONTROL/GO-LESS PO) Take 1 tablet by mouth daily.  . [DISCONTINUED] Saline (SIMPLY SALINE) 0.9 % AERS Place 1 spray into the nose daily.   . [DISCONTINUED] sertraline (ZOLOFT) 50 MG tablet Take 1 tablet (50 mg total) by mouth daily.  . [DISCONTINUED] sodium chloride 0.9 % SOLN by CRRT route as needed. Aerosol: 0.9% amt: one spray: nasal Special Instruction: one spray into each nostril daily, resident self administer one a morning: 8:00 am-11:00am   No facility-administered encounter medications on file as of 12/16/2019.    Review of Systems  Constitutional: Positive for malaise/fatigue. Negative for chills and fever.  HENT: Positive for congestion and hearing loss. Negative for  sore throat.   Eyes: Positive for blurred vision.  Respiratory: Negative for cough and shortness of breath.   Cardiovascular: Negative for chest pain, palpitations and leg swelling.  Gastrointestinal: Negative for abdominal pain and constipation.  Genitourinary: Positive for frequency and urgency. Negative for dysuria,  flank pain and hematuria.       OAB  Musculoskeletal: Positive for falls.       Left leg/knee pain--neuropathic  Skin: Negative for itching and rash.       Posterior scalp laceration  Neurological: Positive for dizziness. Negative for loss of consciousness and weakness.  Endo/Heme/Allergies: Positive for environmental allergies. Bruises/bleeds easily.  Psychiatric/Behavioral: Positive for memory loss. Negative for depression. The patient is nervous/anxious. The patient does not have insomnia.        Anxious about her brother-in-law's health     Vitals:   12/16/19 1128  BP: (!) 146/74  Pulse: (!) 59  Temp: 98 F (36.7 C)  SpO2: 96%  Weight: 117 lb 15.2 oz (53.5 kg)  Height: 5' (1.524 m)   Body mass index is 23.04 kg/m. Physical Exam Vitals reviewed.  Constitutional:      Appearance: Normal appearance.  HENT:     Head: Normocephalic.     Right Ear: External ear normal.     Left Ear: External ear normal.     Ears:     Comments: Hearing aids    Nose: Nose normal.     Mouth/Throat:     Pharynx: Oropharynx is clear.  Eyes:     Extraocular Movements: Extraocular movements intact.     Pupils: Pupils are equal, round, and reactive to light.     Comments: glasses  Cardiovascular:     Rate and Rhythm: Rhythm irregular.     Pulses: Normal pulses.     Heart sounds: No murmur heard.   Pulmonary:     Effort: Pulmonary effort is normal.     Breath sounds: Normal breath sounds. No wheezing or rales.  Abdominal:     General: Bowel sounds are normal. There is no distension.     Palpations: Abdomen is soft.     Tenderness: There is no abdominal tenderness.     Comments: Wearing abdominal binder  Musculoskeletal:        General: Normal range of motion.     Cervical back: Neck supple.     Right lower leg: No edema.     Left lower leg: No edema.     Comments: Compression stocking intact  Lymphadenopathy:     Cervical: No cervical adenopathy.  Skin:    General: Skin is warm and  dry.     Comments: Irregular laceration on posterior scalp appears well approximated w/o swelling and staples intact  Neurological:     General: No focal deficit present.     Mental Status: She is alert and oriented to person, place, and time.     Cranial Nerves: No cranial nerve deficit.     Motor: No weakness.     Coordination: Coordination normal.     Gait: Gait abnormal.     Deep Tendon Reflexes: Reflexes normal.     Comments: Essential tremor, slow moving, using rollator  Psychiatric:        Mood and Affect: Mood normal.     Comments: Anxious and jittery but not more than baseline     Labs reviewed: Basic Metabolic Panel: Recent Labs    07/18/19 0300 12/10/19 1203 12/10/19 1900 12/11/19 0445  NA 141 139  --  136  K 4.7 3.9  --  3.7  CL 105 105  --  102  CO2 24* 25  --  24  GLUCOSE  --  123*  --  108*  BUN 17 18  --  12  CREATININE 0.7 0.86 0.72 0.72  CALCIUM 9.4 9.4  --  9.1   Liver Function Tests: Recent Labs    07/18/19 0300 12/10/19 1203 12/11/19 0445  AST 16 23 27   ALT 13 16 19   ALKPHOS  --  47 54  BILITOT  --  0.9 1.3*  PROT  --  6.7 6.5  ALBUMIN 3.7 3.5 3.3*   No results for input(s): LIPASE, AMYLASE in the last 8760 hours. No results for input(s): AMMONIA in the last 8760 hours. CBC: Recent Labs    07/18/19 0000 07/18/19 0000 07/18/19 0300 12/10/19 1900 12/11/19 0445  WBC 4.9   < > 4.9 9.7 8.5  NEUTROABS  --   --   --  7.6  --   HGB 12.5  --   --  13.1 13.3  HCT 37  --   --  39.8 39.8  MCV  --   --   --  95.2 93.0  PLT 191  --   --  229 211   < > = values in this interval not displayed.   Cardiac Enzymes: No results for input(s): CKTOTAL, CKMB, CKMBINDEX, TROPONINI in the last 8760 hours. BNP: Invalid input(s): POCBNP Lab Results  Component Value Date   HGBA1C 5.9 10/02/2017   Lab Results  Component Value Date   TSH 3.945 12/10/2019   No results found for: VITAMINB12 No results found for: FOLATE No results found for: IRON,  TIBC, FERRITIN  Imaging and Procedures obtained prior to SNF admission: DG Elbow Complete Left  Result Date: 12/10/2019 CLINICAL DATA:  Pain following fall EXAM: LEFT ELBOW - COMPLETE 3+ VIEW COMPARISON:  None. FINDINGS: Frontal, lateral, and bilateral oblique views were obtained. No fracture or dislocation. No joint effusion. Joint spaces appear normal. No erosive change. IMPRESSION: No fracture or dislocation.  No evident arthropathy. Electronically Signed   By: Bretta Bang III M.D.   On: 12/10/2019 13:10   CT Head Wo Contrast  Result Date: 12/10/2019 CLINICAL DATA:  Head and neck trauma EXAM: CT HEAD WITHOUT CONTRAST CT CERVICAL SPINE WITHOUT CONTRAST TECHNIQUE: Multidetector CT imaging of the head and cervical spine was performed following the standard protocol without intravenous contrast. Multiplanar CT image reconstructions of the cervical spine were also generated. COMPARISON:  09/18/2017 CT head FINDINGS: CT HEAD FINDINGS Brain: Atrophy pattern and advanced white matter microvascular ischemic changes throughout both cerebral hemispheres. No acute intracranial hemorrhage, definite new infarction, large mass lesion, midline shift, herniation, hydrocephalus, or extra-axial fluid collection. No focal mass effect or edema. Cisterns are patent. No gross cerebellar abnormality. Vascular: No hyperdense vessel or unexpected calcification. Skull: Normal. Negative for fracture or focal lesion. Sinuses/Orbits: No orbital abnormality. Sinuses clear. Chronic right mastoid effusion. Other: None. CT CERVICAL SPINE FINDINGS Alignment: Normal. Skull base and vertebrae: No acute fracture. No primary bone lesion or focal pathologic process. Soft tissues and spinal canal: No prevertebral fluid or swelling. No visible canal hematoma. Disc levels: Advanced cervical degenerative changes spanning C3-C7. Ankylosis at C3-4 and C4-5 appearing chronic. C5-6 and C6-7 demonstrate marked disc space narrowing, sclerosis and  osteophytes. Degenerative changes of the C1-2 articulation as well. Multilevel facet arthropathy.  No subluxation or dislocation. Upper chest: Visualized lung apices are clear. Aorta atherosclerotic.  Other: None. IMPRESSION: Atrophy and advanced chronic white matter microvascular changes. No acute intracranial abnormality by noncontrast CT. Advanced cervical degenerative changes spanning C3-C7. No acute osseous finding, fracture, malalignment by CT. Electronically Signed   By: Judie Petit.  Shick M.D.   On: 12/10/2019 13:26   CT Cervical Spine Wo Contrast  Result Date: 12/10/2019 CLINICAL DATA:  Head and neck trauma EXAM: CT HEAD WITHOUT CONTRAST CT CERVICAL SPINE WITHOUT CONTRAST TECHNIQUE: Multidetector CT imaging of the head and cervical spine was performed following the standard protocol without intravenous contrast. Multiplanar CT image reconstructions of the cervical spine were also generated. COMPARISON:  09/18/2017 CT head FINDINGS: CT HEAD FINDINGS Brain: Atrophy pattern and advanced white matter microvascular ischemic changes throughout both cerebral hemispheres. No acute intracranial hemorrhage, definite new infarction, large mass lesion, midline shift, herniation, hydrocephalus, or extra-axial fluid collection. No focal mass effect or edema. Cisterns are patent. No gross cerebellar abnormality. Vascular: No hyperdense vessel or unexpected calcification. Skull: Normal. Negative for fracture or focal lesion. Sinuses/Orbits: No orbital abnormality. Sinuses clear. Chronic right mastoid effusion. Other: None. CT CERVICAL SPINE FINDINGS Alignment: Normal. Skull base and vertebrae: No acute fracture. No primary bone lesion or focal pathologic process. Soft tissues and spinal canal: No prevertebral fluid or swelling. No visible canal hematoma. Disc levels: Advanced cervical degenerative changes spanning C3-C7. Ankylosis at C3-4 and C4-5 appearing chronic. C5-6 and C6-7 demonstrate marked disc space narrowing,  sclerosis and osteophytes. Degenerative changes of the C1-2 articulation as well. Multilevel facet arthropathy.  No subluxation or dislocation. Upper chest: Visualized lung apices are clear. Aorta atherosclerotic. Other: None. IMPRESSION: Atrophy and advanced chronic white matter microvascular changes. No acute intracranial abnormality by noncontrast CT. Advanced cervical degenerative changes spanning C3-C7. No acute osseous finding, fracture, malalignment by CT. Electronically Signed   By: Judie Petit.  Shick M.D.   On: 12/10/2019 13:26   ECHOCARDIOGRAM COMPLETE  Result Date: 12/11/2019    ECHOCARDIOGRAM REPORT   Patient Name:   Christina Lawson Date of Exam: 12/11/2019 Medical Rec #:  [id number]          Height:       62.0 in Accession #:    [id number]         Weight:       120.0 lb Date of Birth:  [birth year]           BSA:          1.539 m Patient Age:    86 years           BP:           131/107 mmHg Patient Gender: F                  HR:           61 bpm. Exam Location:  Inpatient Procedure: 2D Echo Indications:    syncope 780.2  History:        Patient has no prior history of Echocardiogram examinations.                 Arrythmias:paroxysmal atrial fibrillation.  Sonographer:    Delcie Roch Referring Phys: [phone] PREETHA JOSEPH IMPRESSIONS  1. Left ventricular ejection fraction, by estimation, is 60 to 65%. The left ventricle has normal function. The left ventricle has no regional wall motion abnormalities. There is mild concentric left ventricular hypertrophy. Left ventricular diastolic parameters are consistent with Grade I diastolic dysfunction (impaired relaxation).  2. Right ventricular systolic function is normal. The right ventricular size  is normal.  3. The mitral valve is normal in structure. Trivial mitral valve regurgitation.  4. The aortic valve is tricuspid. There is mild calcification of the aortic valve. There is mild thickening of the aortic valve. Aortic valve regurgitation is trivial. Mild aortic  valve sclerosis is present, with no evidence of aortic valve stenosis. Comparison(s): No prior Echocardiogram. FINDINGS  Left Ventricle: Left ventricular ejection fraction, by estimation, is 60 to 65%. The left ventricle has normal function. The left ventricle has no regional wall motion abnormalities. The left ventricular internal cavity size was normal in size. There is  mild concentric left ventricular hypertrophy. Left ventricular diastolic parameters are consistent with Grade I diastolic dysfunction (impaired relaxation). The E/e' is >14 suggestive of elevated LAP. Right Ventricle: The right ventricular size is normal. Right vetricular wall thickness was not assessed. Right ventricular systolic function is normal. Left Atrium: Left atrial size was normal in size. Right Atrium: Right atrial size was not well visualized. Pericardium: There is no evidence of pericardial effusion. Mitral Valve: There is dense, focal calcification of the posterior subvalvular apparatus. The mitral valve is normal in structure. There is mild thickening of the mitral valve leaflet(s). There is mild calcification of the mitral valve leaflet(s). Mild to moderate mitral annular calcification. Trivial mitral valve regurgitation. Tricuspid Valve: The tricuspid valve is normal in structure. Tricuspid valve regurgitation is trivial. Aortic Valve: The aortic valve is tricuspid. There is mild calcification of the aortic valve. There is mild thickening of the aortic valve. Aortic valve regurgitation is trivial. Mild aortic valve sclerosis is present, with no evidence of aortic valve stenosis. Pulmonic Valve: The pulmonic valve was normal in structure. Pulmonic valve regurgitation is mild. Aorta: The aortic root is normal in size and structure. IAS/Shunts: The atrial septum is grossly normal.  LEFT VENTRICLE PLAX 2D LVIDd:         3.80 cm  Diastology LVIDs:         2.30 cm  LV e' medial:    4.79 cm/s LV PW:         1.10 cm  LV E/e' medial:  17.2  LV IVS:        1.00 cm  LV e' lateral:   5.11 cm/s LVOT diam:     1.80 cm  LV E/e' lateral: 16.1 LV SV:         64 LV SV Index:   42 LVOT Area:     2.54 cm  RIGHT VENTRICLE             IVC RV S prime:     15.70 cm/s  IVC diam: 1.00 cm LEFT ATRIUM             Index LA diam:        2.70 cm 1.75 cm/m LA Vol (A2C):   37.2 ml 24.18 ml/m LA Vol (A4C):   49.3 ml 32.04 ml/m LA Biplane Vol: 43.6 ml 28.34 ml/m  AORTIC VALVE LVOT Vmax:   90.70 cm/s LVOT Vmean:  56.700 cm/s LVOT VTI:    0.252 m  AORTA Ao Root diam: 3.00 cm Ao Asc diam:  3.10 cm MITRAL VALVE MV Area (PHT): 2.21 cm     SHUNTS MV Decel Time: 343 msec     Systemic VTI:  0.25 m MV E velocity: 82.50 cm/s   Systemic Diam: 1.80 cm MV A velocity: 143.00 cm/s MV E/A ratio:  0.58 Laurance Flatten MD Electronically signed by Laurance Flatten MD Signature Date/Time: 12/11/2019/3:45:30 PM  Final     Assessment/Plan 1. Syncope, unspecified syncope type -felt to be due to orthostatic hypotension of autonomic sort and started on midodrine (waiting for it to arrive here, but had gotten at the hospital with reported benefit) -now receiving PT  2. Autonomic orthostatic hypotension -I still suspect some parkinson's syndrome as patient herself did several months ago at one of our appts, but neurology has continued to say essential tremor only -she is now using abdominal binder when up, compression hose, and started on midodrine -working with PT and using rollator  3. Essential hypertension -amlodipine added at hospital due to significant systolic htn while lying down -bps here now around her baseline of 140s to 150s systolic -has compression hose that might compensate for the edema side effect of amlodipine  4. Parkinsonism, unspecified Parkinsonism type (HCC) -has several signs, but not felt to have parkinson's disease -did not benefit from propranolol or primidone  5. Mixed incontinence urge and stress -begin myrbetriq 25mg  daily here as planned for  [place] when AZO bladder control was not working -AZO should be stopped  6. Anxiety -longstanding, zoloft did help it, she liked taking the benzo for it, but had notable confusion at her appts when on it  7. Asthma, intrinsic -stable from this perspective, cont current regimen  8. Paroxysmal atrial fibrillation (HCC) -rate controlled, cont eliquis at appropriate dose for her age and wt  9. Mild cognitive impairment with memory loss -is mild, moved to [place] at an appropriate time so she can have assistance with meds and some reminders when needed, but remains quite independent  Family/ staff Communication: discussed with pt, her daughter and rehab nursing  Labs/tests ordered:  No new  Christina Lawson, D.O. Geriatrics [facility] [address], [place] [address] Cell Phone (Mon-Fri 8am-5pm):  [phone] On Call:  [phone] & follow prompts after 5pm & weekends Office Phone:  [phone] Office Fax:  [phone]

## 2019-12-17 DIAGNOSIS — I951 Orthostatic hypotension: Secondary | ICD-10-CM | POA: Diagnosis not present

## 2019-12-17 DIAGNOSIS — M6389 Disorders of muscle in diseases classified elsewhere, multiple sites: Secondary | ICD-10-CM | POA: Diagnosis not present

## 2019-12-17 DIAGNOSIS — Z9181 History of falling: Secondary | ICD-10-CM | POA: Diagnosis not present

## 2019-12-17 DIAGNOSIS — R278 Other lack of coordination: Secondary | ICD-10-CM | POA: Diagnosis not present

## 2019-12-17 DIAGNOSIS — R2689 Other abnormalities of gait and mobility: Secondary | ICD-10-CM | POA: Diagnosis not present

## 2019-12-17 DIAGNOSIS — N3941 Urge incontinence: Secondary | ICD-10-CM | POA: Diagnosis not present

## 2019-12-17 DIAGNOSIS — I480 Paroxysmal atrial fibrillation: Secondary | ICD-10-CM | POA: Diagnosis not present

## 2019-12-17 DIAGNOSIS — R2681 Unsteadiness on feet: Secondary | ICD-10-CM | POA: Diagnosis not present

## 2019-12-18 DIAGNOSIS — M6389 Disorders of muscle in diseases classified elsewhere, multiple sites: Secondary | ICD-10-CM | POA: Diagnosis not present

## 2019-12-18 DIAGNOSIS — R2689 Other abnormalities of gait and mobility: Secondary | ICD-10-CM | POA: Diagnosis not present

## 2019-12-18 DIAGNOSIS — R2681 Unsteadiness on feet: Secondary | ICD-10-CM | POA: Diagnosis not present

## 2019-12-18 DIAGNOSIS — Z9181 History of falling: Secondary | ICD-10-CM | POA: Diagnosis not present

## 2019-12-18 DIAGNOSIS — R278 Other lack of coordination: Secondary | ICD-10-CM | POA: Diagnosis not present

## 2019-12-18 DIAGNOSIS — I951 Orthostatic hypotension: Secondary | ICD-10-CM | POA: Diagnosis not present

## 2019-12-18 NOTE — Telephone Encounter (Signed)
Patient has an appointment with Dr. Margaretann Loveless 10/13 at 9:20

## 2019-12-19 DIAGNOSIS — Z9181 History of falling: Secondary | ICD-10-CM | POA: Diagnosis not present

## 2019-12-19 DIAGNOSIS — R2681 Unsteadiness on feet: Secondary | ICD-10-CM | POA: Diagnosis not present

## 2019-12-19 DIAGNOSIS — R278 Other lack of coordination: Secondary | ICD-10-CM | POA: Diagnosis not present

## 2019-12-19 DIAGNOSIS — R2689 Other abnormalities of gait and mobility: Secondary | ICD-10-CM | POA: Diagnosis not present

## 2019-12-19 DIAGNOSIS — M6389 Disorders of muscle in diseases classified elsewhere, multiple sites: Secondary | ICD-10-CM | POA: Diagnosis not present

## 2019-12-19 DIAGNOSIS — I951 Orthostatic hypotension: Secondary | ICD-10-CM | POA: Diagnosis not present

## 2019-12-19 MED ORDER — MIRABEGRON ER 25 MG PO TB24: 25.0000 mg | ORAL_TABLET | Freq: Every day | ORAL | 11 refills | Status: DC

## 2019-12-22 ENCOUNTER — Non-Acute Institutional Stay (SKILLED_NURSING_FACILITY): Payer: Medicare Other | Admitting: Adult Health

## 2019-12-22 ENCOUNTER — Encounter: Payer: Self-pay | Admitting: Adult Health

## 2019-12-22 DIAGNOSIS — J4540 Moderate persistent asthma, uncomplicated: Secondary | ICD-10-CM

## 2019-12-22 DIAGNOSIS — G3184 Mild cognitive impairment, so stated: Secondary | ICD-10-CM

## 2019-12-22 DIAGNOSIS — G20 Parkinson's disease: Secondary | ICD-10-CM

## 2019-12-22 DIAGNOSIS — I951 Orthostatic hypotension: Secondary | ICD-10-CM

## 2019-12-22 DIAGNOSIS — R2681 Unsteadiness on feet: Secondary | ICD-10-CM | POA: Diagnosis not present

## 2019-12-22 DIAGNOSIS — I10 Essential (primary) hypertension: Secondary | ICD-10-CM | POA: Diagnosis not present

## 2019-12-22 DIAGNOSIS — M6389 Disorders of muscle in diseases classified elsewhere, multiple sites: Secondary | ICD-10-CM | POA: Diagnosis not present

## 2019-12-22 DIAGNOSIS — R2689 Other abnormalities of gait and mobility: Secondary | ICD-10-CM | POA: Diagnosis not present

## 2019-12-22 DIAGNOSIS — Z9181 History of falling: Secondary | ICD-10-CM | POA: Diagnosis not present

## 2019-12-22 DIAGNOSIS — I480 Paroxysmal atrial fibrillation: Secondary | ICD-10-CM | POA: Diagnosis not present

## 2019-12-22 DIAGNOSIS — N3281 Overactive bladder: Secondary | ICD-10-CM | POA: Diagnosis not present

## 2019-12-22 DIAGNOSIS — R278 Other lack of coordination: Secondary | ICD-10-CM | POA: Diagnosis not present

## 2019-12-22 NOTE — Progress Notes (Signed)
Location:  Occupational psychologist of Service:  SNF (31)  Provider:  Cindi Carbon, ANP [place] [phone]   PCP: Gayland Curry, DO Patient Care Team: Gayland Curry, DO as PCP - General (Geriatric Medicine) Elouise Munroe, MD as PCP - Cardiology (Cardiology) [facility] Clance, Armando Reichert, MD as Consulting Physician (Pulmonary Disease) Verl Blalock, Marijo Conception, MD (Inactive) as Consulting Physician (Cardiology) Latanya Maudlin, MD as Consulting Physician (Orthopedic Surgery) Kathrynn Ducking, MD as Consulting Physician (Neurology) Renda Rolls, Jennefer Bravo, MD as Referring Physician (Dermatology) Gean Quint, MD (Inactive) as Consulting Physician (Allergy and Immunology) Shon Hough, MD as Consulting Physician (Ophthalmology) Paula Compton, MD as Consulting Physician (Obstetrics and Gynecology) Vicie Mutters, MD as Consulting Physician (Otolaryngology) Raeanne Gathers, AUD (Audiology) Carmel Sacramento, CCC-A (Audiology) Gayland Curry, DO (Geriatric Medicine) Elouise Munroe, MD (Cardiology)  Extended Emergency Contact Information Primary Emergency Contact: Cherlyn Labella Home Phone: [phone] Relation: Daughter Secondary Emergency Contact: Rossi,Lynn Address: [address] [address] Phone: [phone] Mobile Phone: [phone] Relation: Daughter  Code Status: DNR Goals of care:  Advanced Directive information Advanced Directives 12/16/2019  Does Patient Have a Medical Advance Directive? Yes  Type of Advance Directive Out of facility DNR (pink MOST or yellow form);Living will  Does patient want to make changes to medical advance directive? No - Patient declined  Copy of [place] in Chart? -  Pre-existing out of facility DNR order (yellow form or pink MOST form) Pink MOST/Yellow Form most recent copy in chart - Physician notified  to receive inpatient order     Allergies  Allergen Reactions   Clindamycin/Lincomycin Diarrhea    diarrhea   Clindamycin/Lincomycin     Listed on MAR -- reaction unknown   Grass Pollen(K-O-R-T-Swt Vern)     Listed on MAR -- reaction unknown   Ibuprofen Other (See Comments)    Long time ago/ list on paper work   Ibuprofen     Listed on MAR -- reaction unknown   Other     Preparation H Listed on MAR -- reaction unknown   Penicillins Other (See Comments)    Did it involve swelling of the face/tongue/throat, SOB, or low BP? Unknown Did it involve sudden or severe rash/hives, skin peeling, or any reaction on the inside of your mouth or nose? Unknown Did you need to seek medical attention at a hospital or doctor's office? Unknown When did it last happen? If all above answers are NO, may proceed with cephalosporin use.   Penicillins     Listed on MAR -- reaction unknown    Chief Complaint  Patient presents with   Discharge Note    HPI:  85 y.o. female seen for discharge from rehab s/p hospitalization 12/10/19-12/15/19 due to syncope, orthostatic hypotension, and suspected autonomic neuropathy. She was admitted due to a fall with head laceration due to syncope. She was found to have positive orthostatics with SBP 195-202 dropping to the 120's upon standing. She had an EKG which showed unchanged q waves, negative troponins, 2 D echo with EF 65% with no AS, and normal carotid dopplers. She was started on Norvasc and mestinon with improvement in BPs but not complete resolution of orthostatic hypotension.  She had the scalp laceration repaired with staples which have since been removed. During her rehab stay she improved with PT. She is independently ambulatory with  a walker. She is wearing an abd binder and compression hose thigh high which are difficult for her to apply due to her prior hx of tremor. She has been having urinary frequency for several weeks which is worse at  night. She has tried Azo and kegals with no help. She started Myrbetriq on 10/8 but has not noticed a difference just yet. Due to the bathroom trips at night she is going to try a bed side commode and slow position change to avoid falling again. She also has some memory loss MMSE during rehab admission 25/30 with deficits noticed in recall and attention.   Orthostatics L 159.78 67 Sitting 151/84 66, Standing 155/76 69.  She states that her dizziness has improved considerably but still present to a milder degree,   Past Medical History:  Diagnosis Date   Allergic rhinitis    pollen and grass   Asthma    Atrial fibrillation ([place]) 05/13/2018   Atrial fibrillation ([facility])    Blepharitis    Carpal tunnel syndrome, bilateral 07/03/2016   Family history of adverse reaction to anesthesia    " [place] (86YR OLD ) DIED UNDER ANESTHESTIA    High cholesterol    HOH (hard of hearing)    Hyperlipidemia    Irregular heart rhythm    Macular degeneration 2012   Osteoporosis    Sensorineural hearing loss, bilateral 2007   Shingles 2004   Skin cancer 2005   nose   Syncope and collapse 12/11/2019   TIA (transient ischemic attack)    Torn meniscus 2006   right knee   Tremor 2006   familiar.  dx by Dr. Quillian Quince   Unspecified essential hypertension    Unspecified hypothyroidism    Urinary frequency     Past Surgical History:  Procedure Laterality Date   ABDOMINAL HYSTERECTOMY  1977   ABDOMINAL HYSTERECTOMY     CATARACT EXTRACTION  1974   Left, IOL implants   CATARACT EXTRACTION Right 1979   IOL implants   [place]   eye   [place]   SKIN CANCER EXCISION  2005   nose   THYROIDECTOMY  1955   TONSILLECTOMY  1931   TONSILLECTOMY        reports that  she has never smoked. She has never used smokeless tobacco. She reports previous alcohol use. She reports that she does not use drugs. Social History   Socioeconomic History   Marital status: Single    Spouse name: Not on file   Number of children: 2   Years of education: college   Highest education level: Not on file  Occupational History   Occupation: retired Education officer, museum    Comment: retired  Tobacco Use   Smoking status: Never Smoker   Smokeless tobacco: Never Used  Scientific laboratory technician Use: Never used  Substance and Sexual Activity   Alcohol use: Not Currently    Comment: on occ.   Drug use: Never   Sexual activity: Not Currently  Other Topics Concern   Not on file  Social History Narrative   ** Merged History Encounter **       Lives at [place]. Married 1958. Never smoked Alcohol minimal Exercise predominantly walking,  water aerobic Retired Education officer, museum     Social Determinants of Radio broadcast assistant Strain:    Difficulty of Paying Living Expenses: Not on file  Food Insecurity:    Worried About Charity fundraiser in the Last Year: Not on file   [employer] of Food in the Last Year: Not on file  Transportation Needs:    Film/video editor (Medical): Not on file   Lack of Transportation (Non-Medical): Not on file  Physical Activity:    Days of Exercise per Week: Not on file   Minutes of Exercise per Session: Not on file  Stress:    Feeling of Stress : Not on file  Social Connections:    Frequency of Communication with Friends and Family: Not on file   Frequency of Social Gatherings with Friends and Family: Not on file   Attends Religious Services: Not on file   Active Member of Clubs or Organizations: Not on file   Attends Archivist Meetings: Not on file   Marital Status: Not on file  Intimate Partner Violence:    Fear of Current or Ex-Partner: Not on file   Emotionally Abused: Not on file     Physically Abused: Not on file   Sexually Abused: Not on file   Functional Status Survey:    Allergies  Allergen Reactions   Clindamycin/Lincomycin Diarrhea    diarrhea   Clindamycin/Lincomycin     Listed on MAR -- reaction unknown   Grass Pollen(K-O-R-T-Swt Vern)     Listed on MAR -- reaction unknown   Ibuprofen Other (See Comments)    Long time ago/ list on paper work   Ibuprofen     Listed on MAR -- reaction unknown   Other     Preparation H Listed on MAR -- reaction unknown   Penicillins Other (See Comments)    Did it involve swelling of the face/tongue/throat, SOB, or low BP? Unknown Did it involve sudden or severe rash/hives, skin peeling, or any reaction on the inside of your mouth or nose? Unknown Did you need to seek medical attention at a hospital or doctor's office? Unknown When did it last happen? If all above answers are NO, may proceed with cephalosporin use.   Penicillins     Listed on MAR -- reaction unknown    Pertinent  Health Maintenance Due  Topic Date Due   INFLUENZA VACCINE  10/12/2019   DEXA SCAN  Completed   PNA vac Low Risk Adult  Completed    Medications: Outpatient Encounter Medications as of 12/22/2019  Medication Sig   albuterol (VENTOLIN HFA) 108 (90 Base) MCG/ACT inhaler Inhale into the lungs every 6 (six) hours as needed for wheezing.   amLODipine (NORVASC) 5 MG tablet Take 1 tablet (5 mg total) by mouth daily.   apixaban (ELIQUIS) 2.5 MG TABS tablet Take 2.5 mg by mouth 2 (two) times daily.   azelastine (ASTELIN) 0.1 % nasal spray Place 1 spray into both nostrils 2 (two) times daily. Use in each nostril as directed   BREO ELLIPTA 100-25 MCG/INH AEPB Inhale 1 puff into the lungs every evening.    calcium carbonate (TUMS - DOSED IN MG ELEMENTAL CALCIUM) 500 MG chewable tablet Chew 1 tablet by mouth daily as needed for indigestion.   Cholecalciferol (VITAMIN D) 2000 units CAPS Take 1 capsule (2,000 Units  total) by mouth daily.   diclofenac Sodium (VOLTAREN) 1 % GEL Apply topically as needed. MAR states: Dose: "Small amount",  Frequency: "As needed", Instructions: "May keep at bedside and self administer"   docusate sodium (COLACE) 100 MG capsule Take 100 mg by mouth daily.   fluticasone (FLONASE) 50 MCG/ACT nasal spray Place 2 sprays into both nostrils daily.   loratadine (CLARITIN) 10 MG tablet Take 1 tablet (10 mg total) by mouth daily.   metoprolol succinate (TOPROL-XL) 25 MG 24 hr tablet Take 12.5 mg by mouth daily.   mirabegron ER (MYRBETRIQ) 25 MG TB24 tablet Take 1 tablet (25 mg total) by mouth daily.   Multiple Vitamins-Minerals (PRESERVISION AREDS 2+MULTI VIT) CAPS Take 1 capsule by mouth 2 (two) times daily.   oxymetazoline (AFRIN) 0.05 % nasal spray Place 2 sprays into both nostrils 2 (two) times daily as needed for congestion.    pyridostigmine (MESTINON) 60 MG tablet Take 0.5 tablets (30 mg total) by mouth daily.   sertraline (ZOLOFT) 50 MG tablet Take 50 mg by mouth daily.   [DISCONTINUED] HYDROcodone-acetaminophen (NORCO/VICODIN) 5-325 MG tablet Take 0.5 tablets by mouth every 6 (six) hours as needed for severe pain.   No facility-administered encounter medications on file as of 12/22/2019.    Review of Systems  Constitutional: Positive for activity change. Negative for appetite change, chills, diaphoresis, fatigue, fever and unexpected weight change.  HENT: Negative for congestion and trouble swallowing.   Respiratory: Negative for cough, shortness of breath and wheezing.   Cardiovascular: Negative for chest pain, palpitations and leg swelling.  Gastrointestinal: Negative for abdominal distention, abdominal pain, constipation and diarrhea.  Genitourinary: Positive for frequency and urgency. Negative for decreased urine volume, difficulty urinating, dysuria, menstrual problem and pelvic pain.  Musculoskeletal: Negative for arthralgias, back pain, gait problem (uses  walker), joint swelling and myalgias.  Skin: Positive for wound.  Neurological: Positive for dizziness and tremors. Negative for seizures, syncope, facial asymmetry, speech difficulty, weakness, light-headedness, numbness and headaches.  Psychiatric/Behavioral: Negative for agitation, behavioral problems and confusion.       Memory loss    There were no vitals filed for this visit. There is no height or weight on file to calculate BMI. Physical Exam Vitals and nursing note reviewed.  Constitutional:      General: She is not in acute distress.    Appearance: She is not diaphoretic.  HENT:     Head: Normocephalic and atraumatic.     Mouth/Throat:     Mouth: Mucous membranes are moist.     Pharynx: Oropharynx is clear. No oropharyngeal exudate.  Neck:     Vascular: No JVD.  Cardiovascular:     Rate and Rhythm: Normal rate and regular rhythm.     Heart sounds: No murmur heard.   Pulmonary:     Effort: Pulmonary effort is normal. No respiratory distress.     Breath sounds: No wheezing.     Comments: Decreased bases  Abdominal:     General: Bowel sounds are normal. There is no distension.     Palpations: Abdomen is soft.     Tenderness: There is no abdominal tenderness. There is no right CVA tenderness or left CVA tenderness.  Musculoskeletal:     Cervical back: No rigidity or tenderness.     Right lower leg: No edema.     Left lower leg: No edema.     Comments: Kyphosis   Lymphadenopathy:     Cervical: No cervical adenopathy.  Skin:    General: Skin is warm and dry.  Neurological:     Mental Status: She is alert and oriented to person,  place, and time.  Psychiatric:        Mood and Affect: Mood normal.     Labs reviewed: Basic Metabolic Panel: Recent Labs    07/18/19 0300 12/10/19 1203 12/10/19 1900 12/11/19 0445  NA 141 139  --  136  K 4.7 3.9  --  3.7  CL 105 105  --  102  CO2 24* 25  --  24  GLUCOSE  --  123*  --  108*  BUN 17 18  --  12  CREATININE 0.7  0.86 0.72 0.72  CALCIUM 9.4 9.4  --  9.1   Liver Function Tests: Recent Labs    07/18/19 0300 12/10/19 1203 12/11/19 0445  AST 16 23 27   ALT 13 16 19   ALKPHOS  --  47 54  BILITOT  --  0.9 1.3*  PROT  --  6.7 6.5  ALBUMIN 3.7 3.5 3.3*   No results for input(s): LIPASE, AMYLASE in the last 8760 hours. No results for input(s): AMMONIA in the last 8760 hours. CBC: Recent Labs    07/18/19 0000 07/18/19 0000 07/18/19 0300 12/10/19 1900 12/11/19 0445  WBC 4.9   < > 4.9 9.7 8.5  NEUTROABS  --   --   --  7.6  --   HGB 12.5  --   --  13.1 13.3  HCT 37  --   --  39.8 39.8  MCV  --   --   --  95.2 93.0  PLT 191  --   --  229 211   < > = values in this interval not displayed.   Cardiac Enzymes: No results for input(s): CKTOTAL, CKMB, CKMBINDEX, TROPONINI in the last 8760 hours. BNP: Invalid input(s): POCBNP CBG: No results for input(s): GLUCAP in the last 8760 hours.  Procedures and Imaging Studies During Stay: DG Elbow Complete Left  Result Date: 12/10/2019 CLINICAL DATA:  Pain following fall EXAM: LEFT ELBOW - COMPLETE 3+ VIEW COMPARISON:  None. FINDINGS: Frontal, lateral, and bilateral oblique views were obtained. No fracture or dislocation. No joint effusion. Joint spaces appear normal. No erosive change. IMPRESSION: No fracture or dislocation.  No evident arthropathy. Electronically Signed   By: Lowella Grip III M.D.   On: 12/10/2019 13:10   CT Head Wo Contrast  Result Date: 12/10/2019 CLINICAL DATA:  Head and neck trauma EXAM: CT HEAD WITHOUT CONTRAST CT CERVICAL SPINE WITHOUT CONTRAST TECHNIQUE: Multidetector CT imaging of the head and cervical spine was performed following the standard protocol without intravenous contrast. Multiplanar CT image reconstructions of the cervical spine were also generated. COMPARISON:  09/18/2017 CT head FINDINGS: CT HEAD FINDINGS Brain: Atrophy pattern and advanced white matter microvascular ischemic changes throughout both cerebral  hemispheres. No acute intracranial hemorrhage, definite new infarction, large mass lesion, midline shift, herniation, hydrocephalus, or extra-axial fluid collection. No focal mass effect or edema. Cisterns are patent. No gross cerebellar abnormality. Vascular: No hyperdense vessel or unexpected calcification. Skull: Normal. Negative for fracture or focal lesion. Sinuses/Orbits: No orbital abnormality. Sinuses clear. Chronic right mastoid effusion. Other: None. CT CERVICAL SPINE FINDINGS Alignment: Normal. Skull base and vertebrae: No acute fracture. No primary bone lesion or focal pathologic process. Soft tissues and spinal canal: No prevertebral fluid or swelling. No visible canal hematoma. Disc levels: Advanced cervical degenerative changes spanning C3-C7. Ankylosis at C3-4 and C4-5 appearing chronic. C5-6 and C6-7 demonstrate marked disc space narrowing, sclerosis and osteophytes. Degenerative changes of the C1-2 articulation as well. Multilevel facet arthropathy.  No  subluxation or dislocation. Upper chest: Visualized lung apices are clear. Aorta atherosclerotic. Other: None. IMPRESSION: Atrophy and advanced chronic white matter microvascular changes. No acute intracranial abnormality by noncontrast CT. Advanced cervical degenerative changes spanning C3-C7. No acute osseous finding, fracture, malalignment by CT. Electronically Signed   By: Jerilynn Mages.  Shick M.D.   On: 12/10/2019 13:26   CT Cervical Spine Wo Contrast  Result Date: 12/10/2019 CLINICAL DATA:  Head and neck trauma EXAM: CT HEAD WITHOUT CONTRAST CT CERVICAL SPINE WITHOUT CONTRAST TECHNIQUE: Multidetector CT imaging of the head and cervical spine was performed following the standard protocol without intravenous contrast. Multiplanar CT image reconstructions of the cervical spine were also generated. COMPARISON:  09/18/2017 CT head FINDINGS: CT HEAD FINDINGS Brain: Atrophy pattern and advanced white matter microvascular ischemic changes throughout both  cerebral hemispheres. No acute intracranial hemorrhage, definite new infarction, large mass lesion, midline shift, herniation, hydrocephalus, or extra-axial fluid collection. No focal mass effect or edema. Cisterns are patent. No gross cerebellar abnormality. Vascular: No hyperdense vessel or unexpected calcification. Skull: Normal. Negative for fracture or focal lesion. Sinuses/Orbits: No orbital abnormality. Sinuses clear. Chronic right mastoid effusion. Other: None. CT CERVICAL SPINE FINDINGS Alignment: Normal. Skull base and vertebrae: No acute fracture. No primary bone lesion or focal pathologic process. Soft tissues and spinal canal: No prevertebral fluid or swelling. No visible canal hematoma. Disc levels: Advanced cervical degenerative changes spanning C3-C7. Ankylosis at C3-4 and C4-5 appearing chronic. C5-6 and C6-7 demonstrate marked disc space narrowing, sclerosis and osteophytes. Degenerative changes of the C1-2 articulation as well. Multilevel facet arthropathy.  No subluxation or dislocation. Upper chest: Visualized lung apices are clear. Aorta atherosclerotic. Other: None. IMPRESSION: Atrophy and advanced chronic white matter microvascular changes. No acute intracranial abnormality by noncontrast CT. Advanced cervical degenerative changes spanning C3-C7. No acute osseous finding, fracture, malalignment by CT. Electronically Signed   By: Jerilynn Mages.  Shick M.D.   On: 12/10/2019 13:26   ECHOCARDIOGRAM COMPLETE  Result Date: 12/11/2019    ECHOCARDIOGRAM REPORT   Patient Name:   Christina Lawson Date of Exam: 12/11/2019 Medical Rec #:  [id number]          Height:       62.0 in Accession #:    [id number]         Weight:       120.0 lb Date of Birth:  [birth year]           BSA:          1.539 m Patient Age:    16 years           BP:           131/107 mmHg Patient Gender: F                  HR:           61 bpm. Exam Location:  Inpatient Procedure: 2D Echo Indications:    syncope 780.2  History:        Patient has  no prior history of Echocardiogram examinations.                 Arrythmias:paroxysmal atrial fibrillation.  Sonographer:    Johny Chess Referring Phys: [place]  1. Left ventricular ejection fraction, by estimation, is 60 to 65%. The left ventricle has normal function. The left ventricle has no regional wall motion abnormalities. There is mild concentric left ventricular hypertrophy. Left ventricular diastolic parameters are consistent with Grade I diastolic dysfunction (impaired  relaxation).  2. Right ventricular systolic function is normal. The right ventricular size is normal.  3. The mitral valve is normal in structure. Trivial mitral valve regurgitation.  4. The aortic valve is tricuspid. There is mild calcification of the aortic valve. There is mild thickening of the aortic valve. Aortic valve regurgitation is trivial. Mild aortic valve sclerosis is present, with no evidence of aortic valve stenosis. Comparison(s): No prior Echocardiogram. FINDINGS  Left Ventricle: Left ventricular ejection fraction, by estimation, is 60 to 65%. The left ventricle has normal function. The left ventricle has no regional wall motion abnormalities. The left ventricular internal cavity size was normal in size. There is  mild concentric left ventricular hypertrophy. Left ventricular diastolic parameters are consistent with Grade I diastolic dysfunction (impaired relaxation). The E/e' is >14 suggestive of elevated LAP. Right Ventricle: The right ventricular size is normal. Right vetricular wall thickness was not assessed. Right ventricular systolic function is normal. Left Atrium: Left atrial size was normal in size. Right Atrium: Right atrial size was not well visualized. Pericardium: There is no evidence of pericardial effusion. Mitral Valve: There is dense, focal calcification of the posterior subvalvular apparatus. The mitral valve is normal in structure. There is mild thickening of the mitral valve  leaflet(s). There is mild calcification of the mitral valve leaflet(s). Mild to moderate mitral annular calcification. Trivial mitral valve regurgitation. Tricuspid Valve: The tricuspid valve is normal in structure. Tricuspid valve regurgitation is trivial. Aortic Valve: The aortic valve is tricuspid. There is mild calcification of the aortic valve. There is mild thickening of the aortic valve. Aortic valve regurgitation is trivial. Mild aortic valve sclerosis is present, with no evidence of aortic valve stenosis. Pulmonic Valve: The pulmonic valve was normal in structure. Pulmonic valve regurgitation is mild. Aorta: The aortic root is normal in size and structure. IAS/Shunts: The atrial septum is grossly normal.  LEFT VENTRICLE PLAX 2D LVIDd:         3.80 cm  Diastology LVIDs:         2.30 cm  LV e' medial:    4.79 cm/s LV PW:         1.10 cm  LV E/e' medial:  17.2 LV IVS:        1.00 cm  LV e' lateral:   5.11 cm/s LVOT diam:     1.80 cm  LV E/e' lateral: 16.1 LV SV:         64 LV SV Index:   42 LVOT Area:     2.54 cm  RIGHT VENTRICLE             IVC RV S prime:     15.70 cm/s  IVC diam: 1.00 cm LEFT ATRIUM             Index LA diam:        2.70 cm 1.75 cm/m LA Vol (A2C):   37.2 ml 24.18 ml/m LA Vol (A4C):   49.3 ml 32.04 ml/m LA Biplane Vol: 43.6 ml 28.34 ml/m  AORTIC VALVE LVOT Vmax:   90.70 cm/s LVOT Vmean:  56.700 cm/s LVOT VTI:    0.252 m  AORTA Ao Root diam: 3.00 cm Ao Asc diam:  3.10 cm MITRAL VALVE MV Area (PHT): 2.21 cm     SHUNTS MV Decel Time: 343 msec     Systemic VTI:  0.25 m MV E velocity: 82.50 cm/s   Systemic Diam: 1.80 cm MV A velocity: 143.00 cm/s MV E/A ratio:  0.58 Heather  Johney Frame MD Electronically signed by Gwyndolyn Kaufman MD Signature Date/Time: 12/11/2019/3:45:30 PM    Final    VAS US CAROTID  Result Date: 12/12/2019 Carotid Arterial Duplex Study Indications:       Syncope. Risk Factors:      Hypertension. Other Factors:     Atrial fibrillation,. Comparison Study:  No prior study  on file Performing Technologist: Sharion Dove RVS  Examination Guidelines: A complete evaluation includes B-mode imaging, spectral Doppler, color Doppler, and power Doppler as needed of all accessible portions of each vessel. Bilateral testing is considered an integral part of a complete examination. Limited examinations for reoccurring indications may be performed as noted.  Right Carotid Findings: +----------+--------+--------+--------+------------------+------------------+             PSV cm/s EDV cm/s Stenosis Plaque Description Comments            +----------+--------+--------+--------+------------------+------------------+  CCA Prox   56       13                                   intimal thickening  +----------+--------+--------+--------+------------------+------------------+  CCA Distal 68       12                                   intimal thickening  +----------+--------+--------+--------+------------------+------------------+  ICA Prox   59       9                 heterogenous       tortuous            +----------+--------+--------+--------+------------------+------------------+  ICA Distal 34       7                                    tortuous            +----------+--------+--------+--------+------------------+------------------+  ECA        49       6                                                        +----------+--------+--------+--------+------------------+------------------+ +----------+--------+-------+--------+-------------------+             PSV cm/s EDV cms Describe Arm Pressure (mmHG)  +----------+--------+-------+--------+-------------------+  Subclavian 75                                             +----------+--------+-------+--------+-------------------+ +---------+--------+--+--------+-+  Vertebral PSV cm/s 49 EDV cm/s 7  +---------+--------+--+--------+-+  Left Carotid Findings: +----------+--------+--------+--------+------------------+------------------+             PSV cm/s EDV  cm/s Stenosis Plaque Description Comments            +----------+--------+--------+--------+------------------+------------------+  CCA Prox   56       13                                   intimal  thickening  +----------+--------+--------+--------+------------------+------------------+  CCA Distal 54       13                                   intimal thickening  +----------+--------+--------+--------+------------------+------------------+  ICA Prox   52       15                heterogenous       tortuous            +----------+--------+--------+--------+------------------+------------------+  ICA Mid    66       13                                                       +----------+--------+--------+--------+------------------+------------------+  ICA Distal 33       9                                    tortuous            +----------+--------+--------+--------+------------------+------------------+  ECA        68       8                                                        +----------+--------+--------+--------+------------------+------------------+ +----------+--------+--------+--------+-------------------+             PSV cm/s EDV cm/s Describe Arm Pressure (mmHG)  +----------+--------+--------+--------+-------------------+  Subclavian 50                                              +----------+--------+--------+--------+-------------------+ +---------+--------+--+--------+-+  Vertebral PSV cm/s 40 EDV cm/s 3  +---------+--------+--+--------+-+   Summary: Right Carotid: The extracranial vessels were near-normal with only minimal wall                thickening or plaque. Left Carotid: The extracranial vessels were near-normal with only minimal wall               thickening or plaque. Vertebrals:  Bilateral vertebral arteries demonstrate antegrade flow. Subclavians: Normal flow hemodynamics were seen in bilateral subclavian              arteries. *See table(s) above for measurements and observations.   Electronically signed by Antony Contras MD on 12/12/2019 at 2:33:39 PM.    Final     Assessment/Plan:    1. Autonomic orthostatic hypotension Significant improvement in BP numbers with mild remaining dizziness when standing. Ms. Constance Holster baum is ready for discharge but will need close f/u and support from the [facility] staff.  Continue Mestinon 60 mg  1/2 tab daily Encourage oral fluid Continue abd binder and compression hose.  F/U with cardiology   2. Parkinsonism, unspecified Parkinsonism type ([place]) Vs. essential tremor Not interfering with her care at this point but could use weighted utensils if needed  3. Essential hypertension Controlled Continue Norvasc 5 mg qd  4. Moderate persistent asthma without complication Controlled with Breo  5. Paroxysmal atrial fibrillation ([facility]) Rate is controlled with Toprol Continues on Eliquis for CVA risk reduction   6. Mild cognitive impairment with memory loss Noted, f/u with Dr. Mariea Clonts in the clinic   7. OAB Continue Myrbetriq 25 mg qd, if no improvement in the next couple of weeks could consider dose increase.      Future labs/tests needed:  None till f/u clinic apt

## 2019-12-23 DIAGNOSIS — R2681 Unsteadiness on feet: Secondary | ICD-10-CM | POA: Diagnosis not present

## 2019-12-23 DIAGNOSIS — R2689 Other abnormalities of gait and mobility: Secondary | ICD-10-CM | POA: Diagnosis not present

## 2019-12-23 DIAGNOSIS — R278 Other lack of coordination: Secondary | ICD-10-CM | POA: Diagnosis not present

## 2019-12-23 DIAGNOSIS — Z9181 History of falling: Secondary | ICD-10-CM | POA: Diagnosis not present

## 2019-12-23 DIAGNOSIS — M6389 Disorders of muscle in diseases classified elsewhere, multiple sites: Secondary | ICD-10-CM | POA: Diagnosis not present

## 2019-12-23 DIAGNOSIS — I951 Orthostatic hypotension: Secondary | ICD-10-CM | POA: Diagnosis not present

## 2019-12-24 ENCOUNTER — Other Ambulatory Visit: Payer: Self-pay

## 2019-12-24 ENCOUNTER — Ambulatory Visit (INDEPENDENT_AMBULATORY_CARE_PROVIDER_SITE_OTHER): Payer: Medicare Other | Admitting: Internal Medicine

## 2019-12-24 ENCOUNTER — Encounter: Payer: Self-pay | Admitting: Internal Medicine

## 2019-12-24 VITALS — BP 128/72 | HR 68 | Ht 60.0 in | Wt 132.0 lb

## 2019-12-24 DIAGNOSIS — E7800 Pure hypercholesterolemia, unspecified: Secondary | ICD-10-CM

## 2019-12-24 DIAGNOSIS — R278 Other lack of coordination: Secondary | ICD-10-CM | POA: Diagnosis not present

## 2019-12-24 DIAGNOSIS — I480 Paroxysmal atrial fibrillation: Secondary | ICD-10-CM

## 2019-12-24 DIAGNOSIS — R2689 Other abnormalities of gait and mobility: Secondary | ICD-10-CM | POA: Diagnosis not present

## 2019-12-24 DIAGNOSIS — I10 Essential (primary) hypertension: Secondary | ICD-10-CM | POA: Diagnosis not present

## 2019-12-24 DIAGNOSIS — Z9181 History of falling: Secondary | ICD-10-CM | POA: Diagnosis not present

## 2019-12-24 DIAGNOSIS — R55 Syncope and collapse: Secondary | ICD-10-CM

## 2019-12-24 DIAGNOSIS — Z8673 Personal history of transient ischemic attack (TIA), and cerebral infarction without residual deficits: Secondary | ICD-10-CM | POA: Diagnosis not present

## 2019-12-24 DIAGNOSIS — R2681 Unsteadiness on feet: Secondary | ICD-10-CM | POA: Diagnosis not present

## 2019-12-24 DIAGNOSIS — M6389 Disorders of muscle in diseases classified elsewhere, multiple sites: Secondary | ICD-10-CM | POA: Diagnosis not present

## 2019-12-24 DIAGNOSIS — I951 Orthostatic hypotension: Secondary | ICD-10-CM | POA: Diagnosis not present

## 2019-12-24 NOTE — Patient Instructions (Signed)
Medication Instructions:  No Changes In Medications at this time.  *If you need a refill on your cardiac medications before your next appointment, please call your pharmacy*  Lab Work: None Ordered At This Time.  If you have labs (blood work) drawn today and your tests are completely normal, you will receive your results only by: Marland Kitchen MyChart Message (if you have MyChart) OR . A paper copy in the mail If you have any lab test that is abnormal or we need to change your treatment, we will call you to review the results.  Testing/Procedures: None Ordered At This Time.   Follow-Up: At [facility], you and your health needs are our priority.  As part of our continuing mission to provide you with exceptional heart care, we have created designated Provider Care Teams.  These Care Teams include your primary Cardiologist (physician) and Advanced Practice Providers (APPs -  Physician Assistants and Nurse Practitioners) who all work together to provide you with the care you need, when you need it.  Your next appointment:   3 month(s)  The format for your next appointment:   In Person  Provider:   Cherlynn Kaiser, MD  Other Instructions COMPRESSION STOCKING SHEET GIVEN WITH INFORMATION

## 2019-12-25 DIAGNOSIS — R2681 Unsteadiness on feet: Secondary | ICD-10-CM | POA: Diagnosis not present

## 2019-12-25 DIAGNOSIS — R278 Other lack of coordination: Secondary | ICD-10-CM | POA: Diagnosis not present

## 2019-12-25 DIAGNOSIS — Z9181 History of falling: Secondary | ICD-10-CM | POA: Diagnosis not present

## 2019-12-25 DIAGNOSIS — M6389 Disorders of muscle in diseases classified elsewhere, multiple sites: Secondary | ICD-10-CM | POA: Diagnosis not present

## 2019-12-25 DIAGNOSIS — I951 Orthostatic hypotension: Secondary | ICD-10-CM | POA: Diagnosis not present

## 2019-12-25 DIAGNOSIS — R2689 Other abnormalities of gait and mobility: Secondary | ICD-10-CM | POA: Diagnosis not present

## 2019-12-29 ENCOUNTER — Ambulatory Visit: Payer: Medicare Other | Admitting: Internal Medicine

## 2019-12-29 DIAGNOSIS — R2681 Unsteadiness on feet: Secondary | ICD-10-CM | POA: Diagnosis not present

## 2019-12-29 DIAGNOSIS — R278 Other lack of coordination: Secondary | ICD-10-CM | POA: Diagnosis not present

## 2019-12-29 DIAGNOSIS — I951 Orthostatic hypotension: Secondary | ICD-10-CM | POA: Diagnosis not present

## 2019-12-29 DIAGNOSIS — M6389 Disorders of muscle in diseases classified elsewhere, multiple sites: Secondary | ICD-10-CM | POA: Diagnosis not present

## 2019-12-29 DIAGNOSIS — R2689 Other abnormalities of gait and mobility: Secondary | ICD-10-CM | POA: Diagnosis not present

## 2019-12-29 DIAGNOSIS — Z9181 History of falling: Secondary | ICD-10-CM | POA: Diagnosis not present

## 2019-12-30 DIAGNOSIS — I951 Orthostatic hypotension: Secondary | ICD-10-CM | POA: Diagnosis not present

## 2019-12-30 DIAGNOSIS — R2689 Other abnormalities of gait and mobility: Secondary | ICD-10-CM | POA: Diagnosis not present

## 2019-12-30 DIAGNOSIS — M6389 Disorders of muscle in diseases classified elsewhere, multiple sites: Secondary | ICD-10-CM | POA: Diagnosis not present

## 2019-12-30 DIAGNOSIS — R2681 Unsteadiness on feet: Secondary | ICD-10-CM | POA: Diagnosis not present

## 2019-12-30 DIAGNOSIS — R278 Other lack of coordination: Secondary | ICD-10-CM | POA: Diagnosis not present

## 2019-12-30 DIAGNOSIS — Z9181 History of falling: Secondary | ICD-10-CM | POA: Diagnosis not present

## 2019-12-31 DIAGNOSIS — I951 Orthostatic hypotension: Secondary | ICD-10-CM | POA: Diagnosis not present

## 2019-12-31 DIAGNOSIS — R2689 Other abnormalities of gait and mobility: Secondary | ICD-10-CM | POA: Diagnosis not present

## 2019-12-31 DIAGNOSIS — Z9181 History of falling: Secondary | ICD-10-CM | POA: Diagnosis not present

## 2019-12-31 DIAGNOSIS — R278 Other lack of coordination: Secondary | ICD-10-CM | POA: Diagnosis not present

## 2019-12-31 DIAGNOSIS — M6389 Disorders of muscle in diseases classified elsewhere, multiple sites: Secondary | ICD-10-CM | POA: Diagnosis not present

## 2019-12-31 DIAGNOSIS — R2681 Unsteadiness on feet: Secondary | ICD-10-CM | POA: Diagnosis not present

## 2020-01-01 DIAGNOSIS — I951 Orthostatic hypotension: Secondary | ICD-10-CM | POA: Diagnosis not present

## 2020-01-01 DIAGNOSIS — M6389 Disorders of muscle in diseases classified elsewhere, multiple sites: Secondary | ICD-10-CM | POA: Diagnosis not present

## 2020-01-01 DIAGNOSIS — R2689 Other abnormalities of gait and mobility: Secondary | ICD-10-CM | POA: Diagnosis not present

## 2020-01-01 DIAGNOSIS — R278 Other lack of coordination: Secondary | ICD-10-CM | POA: Diagnosis not present

## 2020-01-01 DIAGNOSIS — R2681 Unsteadiness on feet: Secondary | ICD-10-CM | POA: Diagnosis not present

## 2020-01-01 DIAGNOSIS — Z9181 History of falling: Secondary | ICD-10-CM | POA: Diagnosis not present

## 2020-01-02 DIAGNOSIS — R2681 Unsteadiness on feet: Secondary | ICD-10-CM | POA: Diagnosis not present

## 2020-01-02 DIAGNOSIS — I951 Orthostatic hypotension: Secondary | ICD-10-CM | POA: Diagnosis not present

## 2020-01-02 DIAGNOSIS — R278 Other lack of coordination: Secondary | ICD-10-CM | POA: Diagnosis not present

## 2020-01-02 DIAGNOSIS — R2689 Other abnormalities of gait and mobility: Secondary | ICD-10-CM | POA: Diagnosis not present

## 2020-01-02 DIAGNOSIS — M6389 Disorders of muscle in diseases classified elsewhere, multiple sites: Secondary | ICD-10-CM | POA: Diagnosis not present

## 2020-01-02 DIAGNOSIS — Z9181 History of falling: Secondary | ICD-10-CM | POA: Diagnosis not present

## 2020-01-05 DIAGNOSIS — M6389 Disorders of muscle in diseases classified elsewhere, multiple sites: Secondary | ICD-10-CM | POA: Diagnosis not present

## 2020-01-05 DIAGNOSIS — Z9181 History of falling: Secondary | ICD-10-CM | POA: Diagnosis not present

## 2020-01-05 DIAGNOSIS — R278 Other lack of coordination: Secondary | ICD-10-CM | POA: Diagnosis not present

## 2020-01-05 DIAGNOSIS — I951 Orthostatic hypotension: Secondary | ICD-10-CM | POA: Diagnosis not present

## 2020-01-05 DIAGNOSIS — R2681 Unsteadiness on feet: Secondary | ICD-10-CM | POA: Diagnosis not present

## 2020-01-05 DIAGNOSIS — R2689 Other abnormalities of gait and mobility: Secondary | ICD-10-CM | POA: Diagnosis not present

## 2020-01-06 DIAGNOSIS — R2681 Unsteadiness on feet: Secondary | ICD-10-CM | POA: Diagnosis not present

## 2020-01-06 DIAGNOSIS — M6389 Disorders of muscle in diseases classified elsewhere, multiple sites: Secondary | ICD-10-CM | POA: Diagnosis not present

## 2020-01-06 DIAGNOSIS — I951 Orthostatic hypotension: Secondary | ICD-10-CM | POA: Diagnosis not present

## 2020-01-06 DIAGNOSIS — R278 Other lack of coordination: Secondary | ICD-10-CM | POA: Diagnosis not present

## 2020-01-06 DIAGNOSIS — R2689 Other abnormalities of gait and mobility: Secondary | ICD-10-CM | POA: Diagnosis not present

## 2020-01-06 DIAGNOSIS — Z9181 History of falling: Secondary | ICD-10-CM | POA: Diagnosis not present

## 2020-01-07 ENCOUNTER — Encounter: Payer: Self-pay | Admitting: Internal Medicine

## 2020-01-07 ENCOUNTER — Other Ambulatory Visit: Payer: Self-pay

## 2020-01-07 ENCOUNTER — Non-Acute Institutional Stay: Payer: Medicare Other | Admitting: Internal Medicine

## 2020-01-07 VITALS — BP 118/62 | HR 61 | Temp 97.9°F | Ht 60.0 in | Wt 134.4 lb

## 2020-01-07 DIAGNOSIS — N3281 Overactive bladder: Secondary | ICD-10-CM | POA: Diagnosis not present

## 2020-01-07 DIAGNOSIS — H8110 Benign paroxysmal vertigo, unspecified ear: Secondary | ICD-10-CM | POA: Diagnosis not present

## 2020-01-07 DIAGNOSIS — G20C Parkinsonism, unspecified: Secondary | ICD-10-CM

## 2020-01-07 DIAGNOSIS — G20 Parkinson's disease: Secondary | ICD-10-CM | POA: Diagnosis not present

## 2020-01-07 MED ORDER — MIRABEGRON ER 50 MG PO TB24
50.0000 mg | ORAL_TABLET | Freq: Every day | ORAL | Status: DC
Start: 2020-01-07 — End: 2020-04-07

## 2020-01-07 NOTE — Progress Notes (Signed)
Location:   [facility]   Place of Service:  Clinic (12)  Provider: Serenity Fortner L. Renato Gails, D.O., C.M.D.  Code Status: DNR Goals of Care:  Advanced Directives 01/07/2020  Does Patient Have a Medical Advance Directive? -  Type of Advance Directive Out of facility DNR (pink MOST or yellow form)  Does patient want to make changes to medical advance directive? No - Patient declined  Copy of Healthcare Power of Attorney in Chart? -  Pre-existing out of facility DNR order (yellow form or pink MOST form) Pink MOST/Yellow Form most recent copy in chart - Physician notified to receive inpatient order     Chief Complaint  Patient presents with  . Acute Visit    bladder control medication is not helping much. new diagnosis of vertigo  per Rob in rehab. Hearing is getting worse. Dr Renato Gails wanted to see her today.   Marland Kitchen Health Maintenance    influenza (WS)    HPI: Patient is a 86 y.o. female seen today for f/u after rehab stay.    She thinks her urgency is less with the myrbetriq--she has time to get to the bathroom--she's still getting up 3x at night.    Ardelia Mems has been addressing her vertigo.  She is having less of it--less often and less severe.  There was some improvement after just one session of treatment.  She's noting her hearing declining.  She did not wear her hearing aids in the hospital and she had challenges communicating.  Even with them in, she has difficulty hearing and also understanding the sounds.  Past Medical History:  Diagnosis Date  . Allergic rhinitis    pollen and grass  . Asthma   . Atrial fibrillation (HCC) 05/13/2018  . Atrial fibrillation (HCC)   . Blepharitis   . Carpal tunnel syndrome, bilateral 07/03/2016  . Family history of adverse reaction to anesthesia    " MY FIRST COUNSINS CHILD (90YR OLD ) DIED UNDER ANESTHESTIA   . High cholesterol   . HOH (hard of hearing)   . Hyperlipidemia   . Irregular heart rhythm   . Macular degeneration 2012  . Osteoporosis   .  Sensorineural hearing loss, bilateral 2007  . Shingles 2004  . Skin cancer 2005   nose  . Syncope and collapse 12/11/2019  . TIA (transient ischemic attack)   . Torn meniscus 2006   right knee  . Tremor 2006   familiar.  dx by Dr. Hedy Camara  . Unspecified essential hypertension   . Unspecified hypothyroidism   . Urinary frequency     Past Surgical History:  Procedure Laterality Date  . ABDOMINAL HYSTERECTOMY  1977  . ABDOMINAL HYSTERECTOMY    . CATARACT EXTRACTION  1974   Left, IOL implants  . CATARACT EXTRACTION Right 1979   IOL implants  . CHOLECYSTECTOMY  1981  . CHOLECYSTECTOMY    . COSMETIC SURGERY  1995   eye  . CYSTOCELE REPAIR  1993  . DILATION AND CURETTAGE OF UTERUS  1977  . EYE SURGERY    . GALLBLADDER SURGERY  1981  . HIATAL HERNIA REPAIR  1977  . SKIN CANCER EXCISION  2005   nose  . THYROIDECTOMY  1955  . TONSILLECTOMY  1931  . TONSILLECTOMY      Allergies  Allergen Reactions  . Clindamycin/Lincomycin Diarrhea    diarrhea  . Clindamycin/Lincomycin     Listed on MAR -- reaction unknown  . Grass Pollen(K-O-R-T-Swt Vern)     Listed on MAR --  reaction unknown  . Ibuprofen Other (See Comments)    Long time ago/ list on paper work  . Ibuprofen     Listed on MAR -- reaction unknown  . Other     Preparation H Listed on MAR -- reaction unknown  . Penicillins Other (See Comments)    Did it involve swelling of the face/tongue/throat, SOB, or low BP? Unknown Did it involve sudden or severe rash/hives, skin peeling, or any reaction on the inside of your mouth or nose? Unknown Did you need to seek medical attention at a hospital or doctor's office? Unknown When did it last happen? If all above answers are "NO", may proceed with cephalosporin use.  Marland Kitchen Penicillins     Listed on MAR -- reaction unknown    Outpatient Encounter Medications as of 01/07/2020  Medication Sig  . albuterol (VENTOLIN HFA) 108 (90 Base) MCG/ACT inhaler Inhale into the lungs  every 6 (six) hours as needed for wheezing.  Marland Kitchen amLODipine (NORVASC) 5 MG tablet Take 1 tablet (5 mg total) by mouth daily.  Marland Kitchen apixaban (ELIQUIS) 2.5 MG TABS tablet Take 2.5 mg by mouth 2 (two) times daily.  Marland Kitchen azelastine (ASTELIN) 0.1 % nasal spray Place 1 spray into both nostrils 2 (two) times daily. Use in each nostril as directed  . BREO ELLIPTA 100-25 MCG/INH AEPB Inhale 1 puff into the lungs every evening.   . calcium carbonate (TUMS - DOSED IN MG ELEMENTAL CALCIUM) 500 MG chewable tablet Chew 1 tablet by mouth daily as needed for indigestion.  . Cholecalciferol (VITAMIN D) 2000 units CAPS Take 1 capsule (2,000 Units total) by mouth daily.  . diclofenac Sodium (VOLTAREN) 1 % GEL Apply topically as needed. MAR states: Dose: "Small amount", Frequency: "As needed", Instructions: "May keep at bedside and self administer"  . docusate sodium (COLACE) 100 MG capsule Take 100 mg by mouth daily.  . fluticasone (FLONASE) 50 MCG/ACT nasal spray Place 2 sprays into both nostrils daily.  Marland Kitchen loratadine (CLARITIN) 10 MG tablet Take 1 tablet (10 mg total) by mouth daily.  . metoprolol succinate (TOPROL-XL) 25 MG 24 hr tablet Take 12.5 mg by mouth daily.  . mirabegron ER (MYRBETRIQ) 25 MG TB24 tablet Take 1 tablet (25 mg total) by mouth daily.  . Multiple Vitamins-Minerals (PRESERVISION AREDS 2+MULTI VIT) CAPS Take 1 capsule by mouth 2 (two) times daily.  Marland Kitchen oxymetazoline (AFRIN) 0.05 % nasal spray Place 2 sprays into both nostrils 2 (two) times daily as needed for congestion.   Marland Kitchen pyridostigmine (MESTINON) 60 MG tablet Take 0.5 tablets (30 mg total) by mouth daily.  . sertraline (ZOLOFT) 50 MG tablet Take 50 mg by mouth daily.   No facility-administered encounter medications on file as of 01/07/2020.    Review of Systems:  Review of Systems  Constitutional: Negative for chills, fever and malaise/fatigue.  HENT: Positive for hearing loss. Negative for congestion and sore throat.        Hearing loss  worsening  Eyes: Positive for blurred vision.       Did not discuss concerns here today  Respiratory: Negative for cough and shortness of breath.   Cardiovascular: Positive for leg swelling. Negative for chest pain and palpitations.       Minimal left ankle swelling chronically vs right, uses compression hose  Gastrointestinal: Positive for constipation. Negative for abdominal pain, blood in stool and melena.       Was having smaller bms so started taking prune juice and added some fat to  her diet with a muffin with butter on it  Genitourinary: Positive for frequency. Negative for dysuria and urgency.       Urgency improved  Musculoskeletal: Positive for joint pain and myalgias. Negative for back pain and falls.       Has some residual left shoulder and elbow discomfort from prior fall (bandaid on elbow)  Skin: Negative for rash.  Neurological: Positive for dizziness. Negative for loss of consciousness.       Vertigo improving gradually with PT  Endo/Heme/Allergies: Bruises/bleeds easily.  Psychiatric/Behavioral: Positive for memory loss. Negative for depression. The patient is nervous/anxious. The patient does not have insomnia.     Health Maintenance  Topic Date Due  . INFLUENZA VACCINE  10/12/2019  . TETANUS/TDAP  04/07/2025  . DEXA SCAN  Completed  . COVID-19 Vaccine  Completed  . PNA vac Low Risk Adult  Completed    Physical Exam: Vitals:   01/07/20 1015  BP: 118/62  Pulse: 61  Temp: 97.9 F (36.6 C)  TempSrc: Temporal  SpO2: 91%  Weight: 134 lb 6.4 oz (61 kg)  Height: 5' (1.524 m)   Body mass index is 26.25 kg/m. Physical Exam Vitals reviewed.  Constitutional:      General: She is not in acute distress.    Appearance: Normal appearance. She is not toxic-appearing.  HENT:     Head: Normocephalic and atraumatic.     Right Ear: Tympanic membrane and external ear normal.     Left Ear: Tympanic membrane and external ear normal.     Ears:     Comments: Small amount  of cerumen in each ear; uses hearing aids, but still HOH Cardiovascular:     Rate and Rhythm: Normal rate and regular rhythm.     Heart sounds: No murmur heard.   Pulmonary:     Effort: Pulmonary effort is normal.     Breath sounds: Normal breath sounds. No wheezing.  Abdominal:     General: Bowel sounds are normal. There is no distension.     Tenderness: There is no abdominal tenderness. There is no guarding or rebound.  Musculoskeletal:        General: Tenderness present. Normal range of motion.     Cervical back: Neck supple.     Comments: Left shoulder/trapezius  Lymphadenopathy:     Cervical: No cervical adenopathy.  Skin:    General: Skin is warm and dry.  Neurological:     General: No focal deficit present.     Mental Status: She is alert and oriented to person, place, and time.     Gait: Gait abnormal.     Comments: Walks with rollator walker  Psychiatric:        Mood and Affect: Mood normal.        Behavior: Behavior normal.     Labs reviewed: Basic Metabolic Panel: Recent Labs    07/18/19 0300 12/10/19 1203 12/10/19 1900 12/11/19 0445  NA 141 139  --  136  K 4.7 3.9  --  3.7  CL 105 105  --  102  CO2 24* 25  --  24  GLUCOSE  --  123*  --  108*  BUN 17 18  --  12  CREATININE 0.7 0.86 0.72 0.72  CALCIUM 9.4 9.4  --  9.1  TSH  --  3.945  --   --    Liver Function Tests: Recent Labs    07/18/19 0300 12/10/19 1203 12/11/19 0445  AST 16 23  27  ALT 13 16 19   ALKPHOS  --  47 54  BILITOT  --  0.9 1.3*  PROT  --  6.7 6.5  ALBUMIN 3.7 3.5 3.3*   No results for input(s): LIPASE, AMYLASE in the last 8760 hours. No results for input(s): AMMONIA in the last 8760 hours. CBC: Recent Labs    07/18/19 0000 07/18/19 0000 07/18/19 0300 12/10/19 1900 12/11/19 0445  WBC 4.9   < > 4.9 9.7 8.5  NEUTROABS  --   --   --  7.6  --   HGB 12.5  --   --  13.1 13.3  HCT 37  --   --  39.8 39.8  MCV  --   --   --  95.2 93.0  PLT 191  --   --  229 211   < > = values  in this interval not displayed.   Lipid Panel: No results for input(s): CHOL, HDL, LDLCALC, TRIG, CHOLHDL, LDLDIRECT in the last 8760 hours. Lab Results  Component Value Date   HGBA1C 5.9 10/02/2017    Procedures since last visit: DG Elbow Complete Left  Result Date: 12/10/2019 CLINICAL DATA:  Pain following fall EXAM: LEFT ELBOW - COMPLETE 3+ VIEW COMPARISON:  None. FINDINGS: Frontal, lateral, and bilateral oblique views were obtained. No fracture or dislocation. No joint effusion. Joint spaces appear normal. No erosive change. IMPRESSION: No fracture or dislocation.  No evident arthropathy. Electronically Signed   By: Bretta Bang III M.D.   On: 12/10/2019 13:10   CT Head Wo Contrast  Result Date: 12/10/2019 CLINICAL DATA:  Head and neck trauma EXAM: CT HEAD WITHOUT CONTRAST CT CERVICAL SPINE WITHOUT CONTRAST TECHNIQUE: Multidetector CT imaging of the head and cervical spine was performed following the standard protocol without intravenous contrast. Multiplanar CT image reconstructions of the cervical spine were also generated. COMPARISON:  09/18/2017 CT head FINDINGS: CT HEAD FINDINGS Brain: Atrophy pattern and advanced white matter microvascular ischemic changes throughout both cerebral hemispheres. No acute intracranial hemorrhage, definite new infarction, large mass lesion, midline shift, herniation, hydrocephalus, or extra-axial fluid collection. No focal mass effect or edema. Cisterns are patent. No gross cerebellar abnormality. Vascular: No hyperdense vessel or unexpected calcification. Skull: Normal. Negative for fracture or focal lesion. Sinuses/Orbits: No orbital abnormality. Sinuses clear. Chronic right mastoid effusion. Other: None. CT CERVICAL SPINE FINDINGS Alignment: Normal. Skull base and vertebrae: No acute fracture. No primary bone lesion or focal pathologic process. Soft tissues and spinal canal: No prevertebral fluid or swelling. No visible canal hematoma. Disc levels:  Advanced cervical degenerative changes spanning C3-C7. Ankylosis at C3-4 and C4-5 appearing chronic. C5-6 and C6-7 demonstrate marked disc space narrowing, sclerosis and osteophytes. Degenerative changes of the C1-2 articulation as well. Multilevel facet arthropathy.  No subluxation or dislocation. Upper chest: Visualized lung apices are clear. Aorta atherosclerotic. Other: None. IMPRESSION: Atrophy and advanced chronic white matter microvascular changes. No acute intracranial abnormality by noncontrast CT. Advanced cervical degenerative changes spanning C3-C7. No acute osseous finding, fracture, malalignment by CT. Electronically Signed   By: Judie Petit.  Shick M.D.   On: 12/10/2019 13:26   CT Cervical Spine Wo Contrast  Result Date: 12/10/2019 CLINICAL DATA:  Head and neck trauma EXAM: CT HEAD WITHOUT CONTRAST CT CERVICAL SPINE WITHOUT CONTRAST TECHNIQUE: Multidetector CT imaging of the head and cervical spine was performed following the standard protocol without intravenous contrast. Multiplanar CT image reconstructions of the cervical spine were also generated. COMPARISON:  09/18/2017 CT head FINDINGS: CT HEAD FINDINGS  Brain: Atrophy pattern and advanced white matter microvascular ischemic changes throughout both cerebral hemispheres. No acute intracranial hemorrhage, definite new infarction, large mass lesion, midline shift, herniation, hydrocephalus, or extra-axial fluid collection. No focal mass effect or edema. Cisterns are patent. No gross cerebellar abnormality. Vascular: No hyperdense vessel or unexpected calcification. Skull: Normal. Negative for fracture or focal lesion. Sinuses/Orbits: No orbital abnormality. Sinuses clear. Chronic right mastoid effusion. Other: None. CT CERVICAL SPINE FINDINGS Alignment: Normal. Skull base and vertebrae: No acute fracture. No primary bone lesion or focal pathologic process. Soft tissues and spinal canal: No prevertebral fluid or swelling. No visible canal hematoma. Disc  levels: Advanced cervical degenerative changes spanning C3-C7. Ankylosis at C3-4 and C4-5 appearing chronic. C5-6 and C6-7 demonstrate marked disc space narrowing, sclerosis and osteophytes. Degenerative changes of the C1-2 articulation as well. Multilevel facet arthropathy.  No subluxation or dislocation. Upper chest: Visualized lung apices are clear. Aorta atherosclerotic. Other: None. IMPRESSION: Atrophy and advanced chronic white matter microvascular changes. No acute intracranial abnormality by noncontrast CT. Advanced cervical degenerative changes spanning C3-C7. No acute osseous finding, fracture, malalignment by CT. Electronically Signed   By: Judie Petit.  Shick M.D.   On: 12/10/2019 13:26   ECHOCARDIOGRAM COMPLETE  Result Date: 12/11/2019    ECHOCARDIOGRAM REPORT   Patient Name:   ANNYSTON MCINNIS Date of Exam: 12/11/2019 Medical Rec #:  [id number]          Height:       62.0 in Accession #:    [id number]         Weight:       120.0 lb Date of Birth:  11-12-1923           BSA:          1.539 m Patient Age:    86 years           BP:           131/107 mmHg Patient Gender: F                  HR:           61 bpm. Exam Location:  Inpatient Procedure: 2D Echo Indications:    syncope 780.2  History:        Patient has no prior history of Echocardiogram examinations.                 Arrythmias:paroxysmal atrial fibrillation.  Sonographer:    Delcie Roch Referring Phys: [phone] PREETHA JOSEPH IMPRESSIONS  1. Left ventricular ejection fraction, by estimation, is 60 to 65%. The left ventricle has normal function. The left ventricle has no regional wall motion abnormalities. There is mild concentric left ventricular hypertrophy. Left ventricular diastolic parameters are consistent with Grade I diastolic dysfunction (impaired relaxation).  2. Right ventricular systolic function is normal. The right ventricular size is normal.  3. The mitral valve is normal in structure. Trivial mitral valve regurgitation.  4. The aortic  valve is tricuspid. There is mild calcification of the aortic valve. There is mild thickening of the aortic valve. Aortic valve regurgitation is trivial. Mild aortic valve sclerosis is present, with no evidence of aortic valve stenosis. Comparison(s): No prior Echocardiogram. FINDINGS  Left Ventricle: Left ventricular ejection fraction, by estimation, is 60 to 65%. The left ventricle has normal function. The left ventricle has no regional wall motion abnormalities. The left ventricular internal cavity size was normal in size. There is  mild concentric left ventricular hypertrophy. Left ventricular diastolic parameters are  consistent with Grade I diastolic dysfunction (impaired relaxation). The E/e' is >14 suggestive of elevated LAP. Right Ventricle: The right ventricular size is normal. Right vetricular wall thickness was not assessed. Right ventricular systolic function is normal. Left Atrium: Left atrial size was normal in size. Right Atrium: Right atrial size was not well visualized. Pericardium: There is no evidence of pericardial effusion. Mitral Valve: There is dense, focal calcification of the posterior subvalvular apparatus. The mitral valve is normal in structure. There is mild thickening of the mitral valve leaflet(s). There is mild calcification of the mitral valve leaflet(s). Mild to moderate mitral annular calcification. Trivial mitral valve regurgitation. Tricuspid Valve: The tricuspid valve is normal in structure. Tricuspid valve regurgitation is trivial. Aortic Valve: The aortic valve is tricuspid. There is mild calcification of the aortic valve. There is mild thickening of the aortic valve. Aortic valve regurgitation is trivial. Mild aortic valve sclerosis is present, with no evidence of aortic valve stenosis. Pulmonic Valve: The pulmonic valve was normal in structure. Pulmonic valve regurgitation is mild. Aorta: The aortic root is normal in size and structure. IAS/Shunts: The atrial septum is  grossly normal.  LEFT VENTRICLE PLAX 2D LVIDd:         3.80 cm  Diastology LVIDs:         2.30 cm  LV e' medial:    4.79 cm/s LV PW:         1.10 cm  LV E/e' medial:  17.2 LV IVS:        1.00 cm  LV e' lateral:   5.11 cm/s LVOT diam:     1.80 cm  LV E/e' lateral: 16.1 LV SV:         64 LV SV Index:   42 LVOT Area:     2.54 cm  RIGHT VENTRICLE             IVC RV S prime:     15.70 cm/s  IVC diam: 1.00 cm LEFT ATRIUM             Index LA diam:        2.70 cm 1.75 cm/m LA Vol (A2C):   37.2 ml 24.18 ml/m LA Vol (A4C):   49.3 ml 32.04 ml/m LA Biplane Vol: 43.6 ml 28.34 ml/m  AORTIC VALVE LVOT Vmax:   90.70 cm/s LVOT Vmean:  56.700 cm/s LVOT VTI:    0.252 m  AORTA Ao Root diam: 3.00 cm Ao Asc diam:  3.10 cm MITRAL VALVE MV Area (PHT): 2.21 cm     SHUNTS MV Decel Time: 343 msec     Systemic VTI:  0.25 m MV E velocity: 82.50 cm/s   Systemic Diam: 1.80 cm MV A velocity: 143.00 cm/s MV E/A ratio:  0.58 Laurance Flatten MD Electronically signed by Laurance Flatten MD Signature Date/Time: 12/11/2019/3:45:30 PM    Final    VAS US CAROTID  Result Date: 12/12/2019 Carotid Arterial Duplex Study Indications:       Syncope. Risk Factors:      Hypertension. Other Factors:     Atrial fibrillation,. Comparison Study:  No prior study on file Performing Technologist: Sherren Kerns RVS  Examination Guidelines: A complete evaluation includes B-mode imaging, spectral Doppler, color Doppler, and power Doppler as needed of all accessible portions of each vessel. Bilateral testing is considered an integral part of a complete examination. Limited examinations for reoccurring indications may be performed as noted.  Right Carotid Findings: +----------+--------+--------+--------+------------------+------------------+  PSV cm/sEDV cm/sStenosisPlaque DescriptionComments           +----------+--------+--------+--------+------------------+------------------+ CCA Prox  56      13                                intimal  thickening +----------+--------+--------+--------+------------------+------------------+ CCA Distal68      12                                intimal thickening +----------+--------+--------+--------+------------------+------------------+ ICA Prox  59      9               heterogenous      tortuous           +----------+--------+--------+--------+------------------+------------------+ ICA Distal34      7                                 tortuous           +----------+--------+--------+--------+------------------+------------------+ ECA       49      6                                                    +----------+--------+--------+--------+------------------+------------------+ +----------+--------+-------+--------+-------------------+           PSV cm/sEDV cmsDescribeArm Pressure (mmHG) +----------+--------+-------+--------+-------------------+ [id number]                                         +----------+--------+-------+--------+-------------------+ +---------+--------+--+--------+-+ VertebralPSV cm/s49EDV cm/s7 +---------+--------+--+--------+-+  Left Carotid Findings: +----------+--------+--------+--------+------------------+------------------+           PSV cm/sEDV cm/sStenosisPlaque DescriptionComments           +----------+--------+--------+--------+------------------+------------------+ CCA Prox  56      13                                intimal thickening +----------+--------+--------+--------+------------------+------------------+ CCA Distal54      13                                intimal thickening +----------+--------+--------+--------+------------------+------------------+ ICA Prox  52      15              heterogenous      tortuous           +----------+--------+--------+--------+------------------+------------------+ ICA Mid   66      13                                                    +----------+--------+--------+--------+------------------+------------------+ ICA Distal33      9                                 tortuous           +----------+--------+--------+--------+------------------+------------------+ ECA  68      8                                                    +----------+--------+--------+--------+------------------+------------------+ +----------+--------+--------+--------+-------------------+           PSV cm/sEDV cm/sDescribeArm Pressure (mmHG) +----------+--------+--------+--------+-------------------+ Subclavian50                                          +----------+--------+--------+--------+-------------------+ +---------+--------+--+--------+-+ VertebralPSV cm/s40EDV cm/s3 +---------+--------+--+--------+-+   Summary: Right Carotid: The extracranial vessels were near-normal with only minimal wall                thickening or plaque. Left Carotid: The extracranial vessels were near-normal with only minimal wall               thickening or plaque. Vertebrals:  Bilateral vertebral arteries demonstrate antegrade flow. Subclavians: Normal flow hemodynamics were seen in bilateral subclavian              arteries. *See table(s) above for measurements and observations.  Electronically signed by Delia Heady MD on 12/12/2019 at 2:33:39 PM.    Final     Assessment/Plan 1. OAB (overactive bladder) -has had improvement in urgency with addition of myrbetriq to regimen; however, she continues with 3x per night nocturia/frequency which remains an annoyance -will increase myrbetriq from 25mg  to 50mg  and see if that is helpful for her -primary side effects to look out for are hypertension and urinary retention so monitor for this  2. Parkinsonism, unspecified Parkinsonism type (HCC) -diagnosed with essential tremor and has some other s/s of parkinsonism of some type  -cont regular walking program  3. Benign paroxysmal positional vertigo,  unspecified laterality -cont dix-hallpike maneuver with therapy--notes some improvement after first session and appears much more steady than she was in rehab  Labs/tests ordered:  No new added on today Next appt:  01/16/2020 AWV, then see me as scheduled in Jan at [facility]  Savanna Dooley L. Haniel Fix, D.O. Geriatrics [facility] [address], [place] [address] Cell Phone (Mon-Fri 8am-5pm):  [phone] On Call:  [phone] & follow prompts after 5pm & weekends Office Phone:  [phone] Office Fax:  [phone]

## 2020-01-08 DIAGNOSIS — I951 Orthostatic hypotension: Secondary | ICD-10-CM | POA: Diagnosis not present

## 2020-01-08 DIAGNOSIS — R2681 Unsteadiness on feet: Secondary | ICD-10-CM | POA: Diagnosis not present

## 2020-01-08 DIAGNOSIS — Z9181 History of falling: Secondary | ICD-10-CM | POA: Diagnosis not present

## 2020-01-08 DIAGNOSIS — M6389 Disorders of muscle in diseases classified elsewhere, multiple sites: Secondary | ICD-10-CM | POA: Diagnosis not present

## 2020-01-08 DIAGNOSIS — R2689 Other abnormalities of gait and mobility: Secondary | ICD-10-CM | POA: Diagnosis not present

## 2020-01-08 DIAGNOSIS — R278 Other lack of coordination: Secondary | ICD-10-CM | POA: Diagnosis not present

## 2020-01-12 DIAGNOSIS — I951 Orthostatic hypotension: Secondary | ICD-10-CM | POA: Diagnosis not present

## 2020-01-12 DIAGNOSIS — Z9181 History of falling: Secondary | ICD-10-CM | POA: Diagnosis not present

## 2020-01-12 DIAGNOSIS — R2689 Other abnormalities of gait and mobility: Secondary | ICD-10-CM | POA: Diagnosis not present

## 2020-01-12 DIAGNOSIS — R2681 Unsteadiness on feet: Secondary | ICD-10-CM | POA: Diagnosis not present

## 2020-01-16 ENCOUNTER — Ambulatory Visit (INDEPENDENT_AMBULATORY_CARE_PROVIDER_SITE_OTHER): Payer: Medicare Other | Admitting: Family

## 2020-01-16 ENCOUNTER — Encounter: Payer: Self-pay | Admitting: Family

## 2020-01-16 ENCOUNTER — Other Ambulatory Visit: Payer: Self-pay

## 2020-01-16 ENCOUNTER — Encounter: Payer: Medicare Other | Admitting: Family

## 2020-01-16 DIAGNOSIS — Z0000 Encounter for general adult medical examination without abnormal findings: Secondary | ICD-10-CM | POA: Diagnosis not present

## 2020-01-16 NOTE — Progress Notes (Signed)
Subjective:   Christina Lawson is a 86 y.o. female who presents for Medicare Annual (Subsequent) preventive examination.  Review of Systems     Cardiac Risk Factors include: advanced age (>47men, >58 women);hypertension     Objective:    There were no vitals filed for this visit. There is no height or weight on file to calculate BMI.  Advanced Directives 01/16/2020 01/07/2020 12/16/2019 12/11/2019 12/03/2019 07/30/2019 05/20/2019  Does Patient Have a Medical Advance Directive? Yes - Yes Yes Yes Yes -  Type of Advance Directive [place];Living will;Out of facility DNR (pink MOST or yellow form) Out of facility DNR (pink MOST or yellow form) Out of facility DNR (pink MOST or yellow form);Living will Living will Out of facility DNR (pink MOST or yellow form) Out of facility DNR (pink MOST or yellow form);Living will [place];Out of facility DNR (pink MOST or yellow form)  Does patient want to make changes to medical advance directive? No - Patient declined No - Patient declined No - Patient declined No - Patient declined - No - Patient declined No - Patient declined  Copy of [place] in Chart? Yes - validated most recent copy scanned in chart (See row information) - - - - Yes - validated most recent copy scanned in chart (See row information) Yes - validated most recent copy scanned in chart (See row information)  Pre-existing out of facility DNR order (yellow form or pink MOST form) - Pink MOST/Yellow Form most recent copy in chart - Physician notified to receive inpatient order Pink MOST/Yellow Form most recent copy in chart - Physician notified to receive inpatient order - Pink MOST form placed in chart (order not valid for inpatient use) - Pink MOST form placed in chart (order not valid for inpatient use)    Current Medications (verified) Outpatient Encounter Medications as of 01/16/2020  Medication Sig  . albuterol (VENTOLIN HFA) 108  (90 Base) MCG/ACT inhaler Inhale into the lungs every 6 (six) hours as needed for wheezing.  Marland Kitchen amLODipine (NORVASC) 5 MG tablet Take 1 tablet (5 mg total) by mouth daily.  Marland Kitchen apixaban (ELIQUIS) 2.5 MG TABS tablet Take 2.5 mg by mouth 2 (two) times daily.  Marland Kitchen azelastine (ASTELIN) 0.1 % nasal spray Place 1 spray into both nostrils 2 (two) times daily. Use in each nostril as directed  . BREO ELLIPTA 100-25 MCG/INH AEPB Inhale 1 puff into the lungs every evening.   . calcium carbonate (TUMS - DOSED IN MG ELEMENTAL CALCIUM) 500 MG chewable tablet Chew 1 tablet by mouth daily as needed for indigestion.  . Cholecalciferol (VITAMIN D) 2000 units CAPS Take 1 capsule (2,000 Units total) by mouth daily.  . diclofenac Sodium (VOLTAREN) 1 % GEL Apply topically as needed. MAR states: Dose: "Small amount", Frequency: "As needed", Instructions: "May keep at bedside and self administer"  . docusate sodium (COLACE) 100 MG capsule Take 100 mg by mouth daily.  . fluticasone (FLONASE) 50 MCG/ACT nasal spray Place 2 sprays into both nostrils daily.  Marland Kitchen loratadine (CLARITIN) 10 MG tablet Take 1 tablet (10 mg total) by mouth daily.  . metoprolol succinate (TOPROL-XL) 25 MG 24 hr tablet Take 12.5 mg by mouth daily.  . mirabegron ER (MYRBETRIQ) 50 MG TB24 tablet Take 1 tablet (50 mg total) by mouth daily.  . Multiple Vitamins-Minerals (PRESERVISION AREDS 2+MULTI VIT) CAPS Take 1 capsule by mouth 2 (two) times daily.  Marland Kitchen oxymetazoline (AFRIN) 0.05 % nasal spray Place  2 sprays into both nostrils 2 (two) times daily as needed for congestion.   Marland Kitchen pyridostigmine (MESTINON) 60 MG tablet Take 0.5 tablets (30 mg total) by mouth daily.  . sertraline (ZOLOFT) 50 MG tablet Take 50 mg by mouth daily.   No facility-administered encounter medications on file as of 01/16/2020.    Allergies (verified) Clindamycin/lincomycin, Clindamycin/lincomycin, Grass pollen([id number] vern), Ibuprofen, Ibuprofen, Other, Penicillins, and Penicillins    History: Past Medical History:  Diagnosis Date  . Allergic rhinitis    pollen and grass  . Asthma   . Atrial fibrillation ([place]) 05/13/2018  . Atrial fibrillation ([place])   . Blepharitis   . Carpal tunnel syndrome, bilateral 07/03/2016  . Family history of adverse reaction to anesthesia    " [place]   . High cholesterol   . HOH (hard of hearing)   . Hyperlipidemia   . Irregular heart rhythm   . Macular degeneration 2012  . Osteoporosis   . Sensorineural hearing loss, bilateral 2007  . Shingles 2004  . Skin cancer 2005   nose  . Syncope and collapse 12/11/2019  . TIA (transient ischemic attack)   . Torn meniscus 2006   right knee  . Tremor 2006   familiar.  dx by Dr. Quillian Quince  . Unspecified essential hypertension   . Unspecified hypothyroidism   . Urinary frequency    Past Surgical History:  Procedure Laterality Date  . ABDOMINAL HYSTERECTOMY  1977  . ABDOMINAL HYSTERECTOMY    . CATARACT EXTRACTION  1974   Left, IOL implants  . CATARACT EXTRACTION Right 1979   IOL implants  . CHOLECYSTECTOMY  1981  . CHOLECYSTECTOMY    . COSMETIC SURGERY  1995   eye  . [place]  . DILATION AND CURETTAGE OF UTERUS  1977  . EYE SURGERY    . [place]  . HIATAL HERNIA REPAIR  1977  . SKIN CANCER EXCISION  2005   nose  . THYROIDECTOMY  1955  . TONSILLECTOMY  1931  . TONSILLECTOMY     Family History  Problem Relation Age of Onset  . Emphysema Father   . Allergies Father   . Asthma Father   . Brain cancer Mother   . Cancer Mother        Brain  . Hypertension Mother   . Cancer Sister        Cervical  . Endometrial cancer Sister   . Breast cancer Sister 70  . Cancer Sister        Breast, metastasized to bone  . Breast cancer Sister        Cher Nakai of age  . Breast cancer Daughter 17  . Heart disease Maternal Grandfather   . Stroke Other   . Breast cancer Maternal Aunt   . Breast cancer  Paternal Aunt   . Breast cancer Cousin   . Breast cancer Maternal Aunt   . Breast cancer Cousin    Social History   Socioeconomic History  . Marital status: Single    Spouse name: Not on file  . Number of children: 2  . Years of education: college  . Highest education level: Not on file  Occupational History  . Occupation: retired Education officer, museum    Comment: retired  Tobacco Use  . Smoking status: Never Smoker  . Smokeless tobacco: Never Used  Vaping Use  . Vaping Use: Never used  Substance and Sexual Activity  .  Alcohol use: Not Currently    Comment: on occ.  . Drug use: Never  . Sexual activity: Not Currently  Other Topics Concern  . Not on file  Social History Narrative   ** Merged History Encounter **       Lives at [facility]. Married 1958. Never smoked Alcohol minimal Exercise predominantly walking, water aerobic Retired Education officer, museum     Social Determinants of Radio broadcast assistant Strain:   . Difficulty of Paying Living Expenses: Not on file  Food Insecurity:   . Worried About Charity fundraiser in the Last Year: Not on file  . Ran Out of Food in the Last Year: Not on file  Transportation Needs:   . Lack of Transportation (Medical): Not on file  . Lack of Transportation (Non-Medical): Not on file  Physical Activity:   . Days of Exercise per Week: Not on file  . Minutes of Exercise per Session: Not on file  Stress:   . Feeling of Stress : Not on file  Social Connections:   . Frequency of Communication with Friends and Family: Not on file  . Frequency of Social Gatherings with Friends and Family: Not on file  . Attends Religious Services: Not on file  . Active Member of Clubs or Organizations: Not on file  . Attends Archivist Meetings: Not on file  . Marital Status: Not on file    Tobacco Counseling Counseling given: Not Answered   Clinical Intake:  Pre-visit preparation completed: No  Pain : No/denies pain      BMI - recorded: 26.25 Nutritional Status: BMI 25 -29 Overweight Diabetes: No  How often do you need to have someone help you when you read instructions, pamphlets, or other written materials from your doctor or pharmacy?: 1 - Never What is the last grade level you completed in school?: Masters degree  Diabetic?No  Interpreter Needed?: No  Information entered by :: Xavion Muscat ,FNP-C   Activities of Daily Living In your present state of health, do you have any difficulty performing the following activities: 01/16/2020 12/11/2019  Hearing? Tempie Donning  Vision? Y N  Difficulty concentrating or making decisions? Y N  Comment Memory -  Walking or climbing stairs? Y Y  Dressing or bathing? Y N  Comment Compression stockings -  Doing errands, shopping? Tempie Donning  Comment Daughter -  Conservation officer, nature and eating ? [place] living -  Using the Toilet? N -  In the past six months, have you accidently leaked urine? N -  Do you have problems with loss of bowel control? N -  Managing your Medications? [place] your Finances? Y -  Comment Daughter -  Housekeeping or managing your Housekeeping? Y -  Comment Assistive Living -  Some recent data might be hidden    Patient Care Team: Gayland Curry, DO as PCP - General (Geriatric Medicine) Elouise Munroe, MD as PCP - Cardiology (Cardiology) [facility], Armando Reichert, MD as Consulting Physician (Pulmonary Disease) Verl Blalock, Marijo Conception, MD (Inactive) as Consulting Physician (Cardiology) Latanya Maudlin, MD as Consulting Physician (Orthopedic Surgery) Kathrynn Ducking, MD as Consulting Physician (Neurology) [place], Jennefer Bravo, MD as Referring Physician (Dermatology) Gean Quint, MD (Inactive) as Consulting Physician (Allergy and Immunology) Shon Hough, MD as Consulting Physician (Ophthalmology) Paula Compton, MD as Consulting Physician (Obstetrics and  Gynecology) Vicie Mutters, MD as Consulting Physician (  Otolaryngology) Raeanne Gathers, AUD (Audiology) Carmel Sacramento, CCC-A (Audiology) Gayland Curry, DO (Geriatric Medicine) Elouise Munroe, MD (Cardiology)  Indicate any recent Medical Services you may have received from other than [facility] providers in the past year (date may be approximate).     Assessment:   This is a routine wellness examination for Christina Lawson.  Hearing/Vision screen  Hearing Screening   125Hz  250Hz  500Hz  1000Hz  2000Hz  3000Hz  4000Hz  6000Hz  8000Hz   Right ear:           Left ear:           Comments: Some Hearing Concerns. Patient states that she wears hearing aid in both ears but it doesn't help much.  Vision Screening Comments: No Vision Concerns. Patient wears prescription glasses. Next Eye Exam is 02/11/2020.  Dietary issues and exercise activities discussed: Current Exercise Habits: Home exercise routine, Type of exercise: stretching, Time (Minutes): 25, Frequency (Times/Week): 3, Weekly Exercise (Minutes/Week): 75, Intensity: Moderate, Exercise limited by: None identified  Goals    . Increase water intake     Increase water intake by 1-2 glasses per day    . Patient Stated     Improve hearing and sight      Depression Screen PHQ 2/9 Scores 01/16/2020 07/30/2019 03/26/2019 01/15/2019 08/21/2018 04/17/2018 11/20/2017  PHQ - 2 Score 0 0 0 0 0 0 0    Fall Risk Fall Risk  01/16/2020 01/07/2020 12/19/2019 07/30/2019 03/26/2019  Falls in the past year? 1 - - 0 0  Number falls in past yr: 1 0 - 0 0  Injury with Fall? 1 0 - 0 0  Comment - - - - -  Risk for fall due to : - - History of fall(s);Impaired balance/gait;Impaired mobility;Impaired vision;Medication side effect - -  Follow up - - Falls evaluation completed;Education provided;Falls prevention discussed;Follow up appointment - -    Any stairs in or around the home? No  If so, are there any without handrails? No  Home free of loose throw rugs in walkways, pet  beds, electrical cords, etc? Yes  Adequate lighting in your home to reduce risk of falls? Yes   ASSISTIVE DEVICES UTILIZED TO PREVENT FALLS:  Life alert? Yes  Use of a cane, walker or w/c? Yes  Grab bars in the bathroom? Yes  Shower chair or bench in shower? No  Elevated toilet seat or a handicapped toilet? No   TIMED UP AND GO:  Was the test performed? No .  Length of time to ambulate 10 feet: N/A sec.   Gait unsteady with use of assistive device, provider informed and education provided.   Cognitive Function: MMSE - Mini Mental State Exam 11/20/2017 09/19/2016 09/29/2015  Orientation to time 5 4 5   Orientation to Place 5 5 5   Registration 3 3 3   Attention/ Calculation 5 5 5   Recall 1 0 3  Language- name 2 objects 2 2 2   Language- repeat 1 1 1   Language- follow 3 step command 3 3 3   Language- read & follow direction 1 1 1   Write a sentence 1 1 1   Copy design 1 1 1   Total score 28 26 30      6CIT Screen 01/16/2020 01/15/2019  What Year? 0 points 0 points  What month? 0 points 0 points  What time? 0 points 0 points  Count back from 20 0 points 0 points  Months in reverse 0 points 2 points  Repeat phrase 0 points 0 points  Total Score 0 2  Immunizations Immunization History  Administered Date(s) Administered  . Influenza Whole 12/12/2011, 12/31/2012  . Influenza, High Dose Seasonal PF 12/20/2018, 01/09/2020  . Influenza,inj,Quad PF,6+ Mos 01/04/2018  . Influenza-Unspecified 12/31/2014, 01/06/2016, 01/01/2017  . Moderna SARS-COVID-2 Vaccination 03/23/2019, 04/22/2019  . Pneumococcal Conjugate-13 07/14/2014  . Pneumococcal Polysaccharide-23 03/13/2005  . Tdap 04/08/2015  . Zoster 03/13/2006  . Zoster Recombinat (Shingrix) 03/29/2017, 05/24/2017    TDAP status: Up to date Flu Vaccine status: Up to date Pneumococcal vaccine status: Up to date Covid-19 vaccine status: Completed vaccines  Qualifies for Shingles Vaccine? Yes   Zostavax completed No   Shingrix  Completed?: Yes  Screening Tests Health Maintenance  Topic Date Due  . TETANUS/TDAP  04/07/2025  . INFLUENZA VACCINE  Completed  . DEXA SCAN  Completed  . COVID-19 Vaccine  Completed  . PNA vac Low Risk Adult  Completed    Health Maintenance  There are no preventive care reminders to display for this patient.  Colorectal cancer screening: No longer required.  Mammogram status: No longer required.  Bone Density status: Completed 01/29/2015. Results reflect: Bone density results: OSTEOPENIA. Repeat every 2 years.  Lung Cancer Screening: (Low Dose CT Chest recommended if Age 61-80 years, 30 pack-year currently smoking OR have quit w/in 15years.) does not qualify.   Lung Cancer Screening Referral: No  Additional Screening:  Hepatitis C Screening: does not qualify; Completed   Vision Screening: Recommended annual ophthalmology exams for early detection of glaucoma and other disorders of the eye. Is the patient up to date with their annual eye exam?  Yes  Who is the provider or what is the name of the office in which the patient attends annual eye exams? Dr. Julious Oka If pt is not established with a provider, would they like to be referred to a provider to establish care? No .   Dental Screening: Recommended annual dental exams for proper oral hygiene  Community Resource Referral / Chronic Care Management: CRR required this visit?  No   CCM required this visit?  No      Plan:     I have personally reviewed and noted the following in the patient's chart:   . Medical and social history . Use of alcohol, tobacco or illicit drugs  . Current medications and supplements . Functional ability and status . Nutritional status . Physical activity . Advanced directives . List of other physicians . Hospitalizations, surgeries, and ER visits in previous 12 months . Vitals . Screenings to include cognitive, depression, and falls . Referrals and appointments  In addition, I have  reviewed and discussed with patient certain preventive protocols, quality metrics, and best practice recommendations. A written personalized care plan for preventive services as well as general preventive health recommendations were provided to patient.     Sandrea Hughs, NP   01/16/2020   Nurse Notes: No

## 2020-01-16 NOTE — Patient Instructions (Signed)
[name] , Thank you for taking time to come for your Medicare Wellness Visit. I appreciate your ongoing commitment to your health goals. Please review the following plan we discussed and let me know if I can assist you in the future.   Screening recommendations/referrals: Colonoscopy N/A Mammogram N/A Bone Density N/A Recommended yearly ophthalmology/optometry visit for glaucoma screening and checkup Recommended yearly dental visit for hygiene and checkup  Vaccinations: Influenza vaccine Up to date Pneumococcal vaccine Up to date Tdap vaccine Up to date Shingles vaccine Up to date  Advanced directives: yes  Conditions/risks identified: Advanced age >65 yrs, hypertension, osteopenia  Next appointment: 1 year   Preventive Care 74 Years and Older, Female Preventive care refers to lifestyle choices and visits with your health care provider that can promote health and wellness. What does preventive care include?  A yearly physical exam. This is also called an annual well check.  Dental exams once or twice a year.  Routine eye exams. Ask your health care provider how often you should have your eyes checked.  Personal lifestyle choices, including:  Daily care of your teeth and gums.  Regular physical activity.  Eating a healthy diet.  Avoiding tobacco and drug use.  Limiting alcohol use.  Practicing safe sex.  Taking low-dose aspirin every day.  Taking vitamin and mineral supplements as recommended by your health care provider. What happens during an annual well check? The services and screenings done by your health care provider during your annual well check will depend on your age, overall health, lifestyle risk factors, and family history of disease. Counseling  Your health care provider may ask you questions about your:  Alcohol use.  Tobacco use.  Drug use.  Emotional well-being.  Home and relationship well-being.  Sexual activity.  Eating  habits.  History of falls.  Memory and ability to understand (cognition).  Work and work Statistician.  Reproductive health. Screening  You may have the following tests or measurements:  Height, weight, and BMI.  Blood pressure.  Lipid and cholesterol levels. These may be checked every 5 years, or more frequently if you are over 66 years old.  Skin check.  Lung cancer screening. You may have this screening every year starting at age 75 if you have a 30-pack-year history of smoking and currently smoke or have quit within the past 15 years.  Fecal occult blood test (FOBT) of the stool. You may have this test every year starting at age 37.  Flexible sigmoidoscopy or colonoscopy. You may have a sigmoidoscopy every 5 years or a colonoscopy every 10 years starting at age 36.  Hepatitis C blood test.  Hepatitis B blood test.  Sexually transmitted disease (STD) testing.  Diabetes screening. This is done by checking your blood sugar (glucose) after you have not eaten for a while (fasting). You may have this done every 1-3 years.  Bone density scan. This is done to screen for osteoporosis. You may have this done starting at age 31.  Mammogram. This may be done every 1-2 years. Talk to your health care provider about how often you should have regular mammograms. Talk with your health care provider about your test results, treatment options, and if necessary, the need for more tests. Vaccines  Your health care provider may recommend certain vaccines, such as:  Influenza vaccine. This is recommended every year.  Tetanus, diphtheria, and acellular pertussis (Tdap, Td) vaccine. You may need a Td booster every 10 years.  Zoster vaccine. You may  need this after age 56.  Pneumococcal 13-valent conjugate (PCV13) vaccine. One dose is recommended after age 51.  Pneumococcal polysaccharide (PPSV23) vaccine. One dose is recommended after age 33. Talk to your health care provider about which  screenings and vaccines you need and how often you need them. This information is not intended to replace advice given to you by your health care provider. Make sure you discuss any questions you have with your health care provider. Document Released: 03/26/2015 Document Revised: 11/17/2015 Document Reviewed: 12/29/2014 Elsevier Interactive Patient Education  2017 [employer] [employer] Prevention in the Home Falls can cause injuries. They can happen to people of all ages. There are many things you can do to make your home safe and to help prevent falls. What can I do on the outside of my home?  Regularly fix the edges of walkways and driveways and fix any cracks.  Remove anything that might make you trip as you walk through a door, such as a raised step or threshold.  Trim any bushes or trees on the path to your home.  Use bright outdoor lighting.  Clear any walking paths of anything that might make someone trip, such as rocks or tools.  Regularly check to see if handrails are loose or broken. Make sure that both sides of any steps have handrails.  Any raised decks and porches should have guardrails on the edges.  Have any leaves, snow, or ice cleared regularly.  Use sand or salt on walking paths during winter.  Clean up any spills in your garage right away. This includes oil or grease spills. What can I do in the bathroom?  Use night lights.  Install grab bars by the toilet and in the tub and shower. Do not use towel bars as grab bars.  Use non-skid mats or decals in the tub or shower.  If you need to sit down in the shower, use a plastic, non-slip stool.  Keep the floor dry. Clean up any water that spills on the floor as soon as it happens.  Remove soap buildup in the tub or shower regularly.  Attach bath mats securely with double-sided non-slip rug tape.  Do not have throw rugs and other things on the floor that can make you trip. What can I do in the bedroom?  Use  night lights.  Make sure that you have a light by your bed that is easy to reach.  Do not use any sheets or blankets that are too big for your bed. They should not hang down onto the floor.  Have a firm chair that has side arms. You can use this for support while you get dressed.  Do not have throw rugs and other things on the floor that can make you trip. What can I do in the kitchen?  Clean up any spills right away.  Avoid walking on wet floors.  Keep items that you use a lot in easy-to-reach places.  If you need to reach something above you, use a strong step stool that has a grab bar.  Keep electrical cords out of the way.  Do not use floor polish or wax that makes floors slippery. If you must use wax, use non-skid floor wax.  Do not have throw rugs and other things on the floor that can make you trip. What can I do with my stairs?  Do not leave any items on the stairs.  Make sure that there are handrails on both sides  of the stairs and use them. Fix handrails that are broken or loose. Make sure that handrails are as long as the stairways.  Check any carpeting to make sure that it is firmly attached to the stairs. Fix any carpet that is loose or worn.  Avoid having throw rugs at the top or bottom of the stairs. If you do have throw rugs, attach them to the floor with carpet tape.  Make sure that you have a light switch at the top of the stairs and the bottom of the stairs. If you do not have them, ask someone to add them for you. What else can I do to help prevent falls?  Wear shoes that:  Do not have high heels.  Have rubber bottoms.  Are comfortable and fit you well.  Are closed at the toe. Do not wear sandals.  If you use a stepladder:  Make sure that it is fully opened. Do not climb a closed stepladder.  Make sure that both sides of the stepladder are locked into place.  Ask someone to hold it for you, if possible.  Clearly mark and make sure that you  can see:  Any grab bars or handrails.  First and last steps.  Where the edge of each step is.  Use tools that help you move around (mobility aids) if they are needed. These include:  Canes.  Walkers.  Scooters.  Crutches.  Turn on the lights when you go into a dark area. Replace any light bulbs as soon as they burn out.  Set up your furniture so you have a clear path. Avoid moving your furniture around.  If any of your floors are uneven, fix them.  If there are any pets around you, be aware of where they are.  Review your medicines with your doctor. Some medicines can make you feel dizzy. This can increase your chance of falling. Ask your doctor what other things that you can do to help prevent falls. This information is not intended to replace advice given to you by your health care provider. Make sure you discuss any questions you have with your health care provider. Document Released: 12/24/2008 Document Revised: 08/05/2015 Document Reviewed: 04/03/2014 Elsevier Interactive Patient Education  2017 [employer].

## 2020-01-16 NOTE — Progress Notes (Signed)
    This service is provided via telemedicine  No vital signs collected/recorded due to the encounter was a telemedicine visit.   Location of patient (ex: home, work): Home.   Patient consents to a telephone visit: Yes.  Location of the provider (ex: office, home):  [facility].  Name of any referring provider: Reed, Tiffany L, DO   Names of all persons participating in the telemedicine service and their role in the encounter: Patient, Nera Haworth, RMA, Ngetich, Dinah, NP.    Time spent on call: 8 minutes spent on the phone with Medical Assistant.   

## 2020-01-19 ENCOUNTER — Telehealth: Payer: Self-pay

## 2020-01-19 DIAGNOSIS — I951 Orthostatic hypotension: Secondary | ICD-10-CM | POA: Diagnosis not present

## 2020-01-19 DIAGNOSIS — Z9181 History of falling: Secondary | ICD-10-CM | POA: Diagnosis not present

## 2020-01-19 DIAGNOSIS — R2689 Other abnormalities of gait and mobility: Secondary | ICD-10-CM | POA: Diagnosis not present

## 2020-01-19 DIAGNOSIS — R2681 Unsteadiness on feet: Secondary | ICD-10-CM | POA: Diagnosis not present

## 2020-01-19 NOTE — Telephone Encounter (Signed)
[name], worden are scheduled for a virtual visit with your provider today.    Just as we do with appointments in the office, we must obtain your consent to participate.  Your consent will be active for this visit and any virtual visit you may have with one of our providers in the next 365 days.    If you have a MyChart account, I can also send a copy of this consent to you electronically.  All virtual visits are billed to your insurance company just like a traditional visit in the office.  As this is a virtual visit, video technology does not allow for your provider to perform a traditional examination.  This may limit your provider's ability to fully assess your condition.  If your provider identifies any concerns that need to be evaluated in person or the need to arrange testing such as labs, EKG, etc, we will make arrangements to do so.    Although advances in technology are sophisticated, we cannot ensure that it will always work on either your end or our end.  If the connection with a video visit is poor, we may have to switch to a telephone visit.  With either a video or telephone visit, we are not always able to ensure that we have a secure connection.   I need to obtain your verbal consent now.   Are you willing to proceed with your visit today?   Christina Lawson has provided verbal consent on 01/16/2020 for a virtual visit (video or telephone).   [name], [name] 01/16/2020  10:00 AM

## 2020-01-25 NOTE — Progress Notes (Signed)
Cardiology Office Note:    Date:  12/24/2019   ID:  Christina Lawson, DOB August 14, 1923, MRN [id number]  PCP:  Christina Curry, DO  Cardiologist:  Christina Munroe, MD  Electrophysiologist:  None   Referring MD: Christina Curry, DO   Chief Complaint/Reason for Referral: Orthostatic hypotension, syncope/falls, PAF on [facility]  History of Present Illness:    Christina Lawson is a 86 y.o. female with a history of hypertension, hypothyroidism, tremor, hyperlipidemia, prior TIA, and recent hospitalization for syncopal episode found to have significant orthostatic hypotension. She was started on pyridostigmine bromide (Mestinon) and has been tolerating this well overall. Attends the visit with family today (daughter) and she provides some of the history.  Approximately 15 pages of records from [employer] [facility] reviewed today. BP is elevated overall, but she has been asymptomatic with elevated BP. No recurrence of presyncope or syncope since returning home. She has been taking precautions to avoid falls.   We had a long, detailed discussion about anticoagulation. Her main concern is strokes with history of TIA. We also discussed risk of bleeding from falls.   She denies chest pain, SOB or palpitations today.    Past Medical History:  Diagnosis Date  . Allergic rhinitis    pollen and grass  . Asthma   . Atrial fibrillation ([place]) 05/13/2018  . Atrial fibrillation ([place])   . Blepharitis   . Carpal tunnel syndrome, bilateral 07/03/2016  . Family history of adverse reaction to anesthesia    " Christina Lawson   . High cholesterol   . HOH (hard of hearing)   . Hyperlipidemia   . Irregular heart rhythm   . Macular degeneration 2012  . Osteoporosis   . Sensorineural hearing loss, bilateral 2007  . Shingles 2004  . Skin cancer 2005   nose  . Syncope and collapse 12/11/2019  . TIA (transient ischemic attack)   . Torn meniscus 2006    right knee  . Tremor 2006   familiar.  dx by Dr. Quillian Lawson  . Unspecified essential hypertension   . Unspecified hypothyroidism   . Urinary frequency     Past Surgical History:  Procedure Laterality Date  . ABDOMINAL HYSTERECTOMY  1977  . ABDOMINAL HYSTERECTOMY    . CATARACT EXTRACTION  1974   Left, IOL implants  . CATARACT EXTRACTION Right 1979   IOL implants  . CHOLECYSTECTOMY  1981  . CHOLECYSTECTOMY    . COSMETIC SURGERY  1995   eye  . [place]  . DILATION AND CURETTAGE OF UTERUS  1977  . EYE SURGERY    . [place]  . HIATAL HERNIA REPAIR  1977  . SKIN CANCER EXCISION  2005   nose  . THYROIDECTOMY  1955  . TONSILLECTOMY  1931  . TONSILLECTOMY      Current Medications: Current Meds  Medication Sig  . albuterol (VENTOLIN HFA) 108 (90 Base) MCG/ACT inhaler Inhale into the lungs every 6 (six) hours as needed for wheezing.  Marland Kitchen amLODipine (NORVASC) 5 MG tablet Take 1 tablet (5 mg total) by mouth daily.  Marland Kitchen apixaban (ELIQUIS) 2.5 MG TABS tablet Take 2.5 mg by mouth 2 (two) times daily.  Marland Kitchen azelastine (ASTELIN) 0.1 % nasal spray Place 1 spray into both nostrils 2 (two) times daily. Use in each nostril as directed  . BREO ELLIPTA 100-25 MCG/INH AEPB Inhale 1 puff into the lungs every evening.   . calcium  carbonate (TUMS - DOSED IN MG ELEMENTAL CALCIUM) 500 MG chewable tablet Chew 1 tablet by mouth daily as needed for indigestion.  . Cholecalciferol (VITAMIN D) 2000 units CAPS Take 1 capsule (2,000 Units total) by mouth daily.  . diclofenac Sodium (VOLTAREN) 1 % GEL Apply topically as needed. MAR states: Dose: "Small amount", Frequency: "As needed", Instructions: "May keep at bedside and self administer"  . docusate sodium (COLACE) 100 MG capsule Take 100 mg by mouth daily.  . fluticasone (FLONASE) 50 MCG/ACT nasal spray Place 2 sprays into both nostrils daily.  Marland Kitchen loratadine (CLARITIN) 10 MG tablet Take 1 tablet (10 mg total) by mouth daily.  .  metoprolol succinate (TOPROL-XL) 25 MG 24 hr tablet Take 12.5 mg by mouth daily.  . Multiple Vitamins-Minerals (PRESERVISION AREDS 2+MULTI VIT) CAPS Take 1 capsule by mouth 2 (two) times daily.  Marland Kitchen oxymetazoline (AFRIN) 0.05 % nasal spray Place 2 sprays into both nostrils 2 (two) times daily as needed for congestion.   Marland Kitchen pyridostigmine (MESTINON) 60 MG tablet Take 0.5 tablets (30 mg total) by mouth daily.  . sertraline (ZOLOFT) 50 MG tablet Take 50 mg by mouth daily.  . [DISCONTINUED] mirabegron ER (MYRBETRIQ) 25 MG TB24 tablet Take 1 tablet (25 mg total) by mouth daily.     Allergies:   Clindamycin/lincomycin, Clindamycin/lincomycin, Grass pollen([id number]-o-[id number]-swt vern), Ibuprofen, Ibuprofen, Other, Penicillins, and Penicillins   Social History   Tobacco Use  . Smoking status: Never Smoker  . Smokeless tobacco: Never Used  Vaping Use  . Vaping Use: Never used  Substance Use Topics  . Alcohol use: Not Currently    Comment: on occ.  . Drug use: Never     Family History: The patient's family history includes Allergies in her father; Asthma in her father; Brain cancer in her mother; Breast cancer in her cousin, cousin, maternal aunt, maternal aunt, paternal aunt, and sister; Breast cancer (age of onset: 44) in her sister; Breast cancer (age of onset: 78) in her daughter; Cancer in her mother, sister, and sister; Emphysema in her father; Endometrial cancer in her sister; Heart disease in her maternal grandfather; Hypertension in her mother; Stroke in an other family member.  ROS:   Please see the history of present illness.    All other systems reviewed and are negative.  EKGs/Labs/Other Studies Reviewed:    The following studies were reviewed today:  Recent Labs: 12/10/2019: TSH 3.945 12/11/2019: ALT 19; BUN 12; Creatinine, Ser 0.72; Hemoglobin 13.3; Platelets 211; Potassium 3.7; Sodium 136  Recent Lipid Panel    Component Value Date/Time   CHOL 211 (H) 05/14/2018 0219   TRIG 61  05/14/2018 0219   HDL 53 05/14/2018 0219   CHOLHDL 4.0 05/14/2018 0219   VLDL 12 05/14/2018 0219   LDLCALC 146 (H) 05/14/2018 0219    Physical Exam:    VS:  BP 128/72   Pulse 68   Ht 5' (1.524 m)   Wt 132 lb (59.9 kg)   SpO2 98%   BMI 25.78 kg/m     Wt Readings from Last 5 Encounters:  01/07/20 134 lb 6.4 oz (61 kg)  12/24/19 132 lb (59.9 kg)  12/16/19 117 lb 15.2 oz (53.5 kg)  12/15/19 117 lb 15.1 oz (53.5 kg)  12/03/19 133 lb 3.2 oz (60.4 kg)    Constitutional: No acute distress Cardiovascular: regular rhythm, normal rate, soft systolic murmur. S1 and S2 normal. Radial pulses normal bilaterally. No jugular venous distention.  Respiratory: clear to auscultation bilaterally GI :  normal bowel sounds, soft and nontender. No distention.   MSK: extremities warm, well perfused. No edema.  NEURO: grossly nonfocal exam, moves all extremities. PSYCH: alert and oriented x 3, normal mood and affect.   ASSESSMENT:    1. Paroxysmal atrial fibrillation (HCC)   2. Syncope, unspecified syncope type   3. Essential hypertension   4. Pure hypercholesterolemia   5. H/O TIA (transient ischemic attack) and stroke    PLAN:    Paroxysmal atrial fibrillation (HCC) H/O TIA (transient ischemic attack) and stroke Syncope, unspecified syncope type  We participated in shared decision making and determined that she would prefer to cautiously continue on anticoagulation. We will revisit at every follow up visit. Can continue on mestinon for now. BP with reasonable control. No further orthostasis since being home. Conservative measures including abdominal binder, compression stockings, and rising slowly advised. Adequate hydration as well.   This patients CHA2DS2-VASc Score and unadjusted Ischemic Stroke Rate (% per year) is equal to 9.7 % stroke rate/year from a score of 6 Above score calculated as 1 point each if present [CHF, HTN, DM, Vascular=MI/PAD/Aortic Plaque, Age if 65-74, or Female] Above  score calculated as 2 points each if present [Age > 75, or Stroke/TIA/TE]   Essential hypertension -continue metoprolol (for afib as well) and amlodipine.  Pure hypercholesterolemia - observation given age. Can recheck and treat if patient prefers.   Total time of encounter: 30 minutes total time of encounter, including 25 minutes spent in face-to-face patient care on the date of this encounter. This time includes coordination of care and counseling regarding above mentioned problem list. Remainder of non-face-to-face time involved reviewing chart documents/testing relevant to the patient encounter and documentation in the medical record. I have independently reviewed documentation from referring provider.   Cherlynn Kaiser, MD [place]  [facility]    Medication Adjustments/Labs and Tests Ordered: Current medicines are reviewed at length with the patient today.  Concerns regarding medicines are outlined above.   No orders of the defined types were placed in this encounter.   No orders of the defined types were placed in this encounter.   Patient Instructions  Medication Instructions:  No Changes In Medications at this time.  *If you need a refill on your cardiac medications before your next appointment, please call your pharmacy*  Lab Work: None Ordered At This Time.  If you have labs (blood work) drawn today and your tests are completely normal, you will receive your results only by: Marland Kitchen MyChart Message (if you have MyChart) OR . A paper copy in the mail If you have any lab test that is abnormal or we need to change your treatment, we will call you to review the results.  Testing/Procedures: None Ordered At This Time.   Follow-Up: At [facility], you and your health needs are our priority.  As part of our continuing mission to provide you with exceptional heart care, we have created designated Provider Care Teams.  These Care Teams include your primary Cardiologist  (physician) and Advanced Practice Providers (APPs -  Physician Assistants and Nurse Practitioners) who all work together to provide you with the care you need, when you need it.  Your next appointment:   3 month(s)  The format for your next appointment:   In Person  Provider:   Cherlynn Kaiser, MD  Other Instructions COMPRESSION STOCKING SHEET GIVEN WITH INFORMATION

## 2020-01-28 DIAGNOSIS — Z85828 Personal history of other malignant neoplasm of skin: Secondary | ICD-10-CM | POA: Diagnosis not present

## 2020-01-28 DIAGNOSIS — L570 Actinic keratosis: Secondary | ICD-10-CM | POA: Diagnosis not present

## 2020-01-28 DIAGNOSIS — L814 Other melanin hyperpigmentation: Secondary | ICD-10-CM | POA: Diagnosis not present

## 2020-01-28 DIAGNOSIS — L821 Other seborrheic keratosis: Secondary | ICD-10-CM | POA: Diagnosis not present

## 2020-02-03 ENCOUNTER — Other Ambulatory Visit: Payer: Self-pay | Admitting: Internal Medicine

## 2020-02-03 DIAGNOSIS — G8929 Other chronic pain: Secondary | ICD-10-CM

## 2020-02-03 DIAGNOSIS — M545 Low back pain, unspecified: Secondary | ICD-10-CM

## 2020-02-03 DIAGNOSIS — M79605 Pain in left leg: Secondary | ICD-10-CM

## 2020-02-03 MED ORDER — HYDROCODONE-ACETAMINOPHEN 5-325 MG PO TABS: 0.5000 | ORAL_TABLET | Freq: Two times a day (BID) | ORAL | 0 refills | Status: DC | PRN

## 2020-02-16 DIAGNOSIS — H02105 Unspecified ectropion of left lower eyelid: Secondary | ICD-10-CM | POA: Diagnosis not present

## 2020-02-16 DIAGNOSIS — H02102 Unspecified ectropion of right lower eyelid: Secondary | ICD-10-CM | POA: Diagnosis not present

## 2020-02-16 DIAGNOSIS — Z961 Presence of intraocular lens: Secondary | ICD-10-CM | POA: Diagnosis not present

## 2020-02-16 DIAGNOSIS — H5203 Hypermetropia, bilateral: Secondary | ICD-10-CM | POA: Diagnosis not present

## 2020-03-26 ENCOUNTER — Encounter: Payer: Self-pay | Admitting: Internal Medicine

## 2020-03-26 ENCOUNTER — Ambulatory Visit (INDEPENDENT_AMBULATORY_CARE_PROVIDER_SITE_OTHER): Payer: Medicare Other | Admitting: Internal Medicine

## 2020-03-26 ENCOUNTER — Other Ambulatory Visit: Payer: Self-pay

## 2020-03-26 VITALS — BP 140/70 | Ht 60.0 in | Wt 136.6 lb

## 2020-03-26 DIAGNOSIS — I10 Essential (primary) hypertension: Secondary | ICD-10-CM

## 2020-03-26 DIAGNOSIS — I480 Paroxysmal atrial fibrillation: Secondary | ICD-10-CM | POA: Diagnosis not present

## 2020-03-26 DIAGNOSIS — E7800 Pure hypercholesterolemia, unspecified: Secondary | ICD-10-CM | POA: Diagnosis not present

## 2020-03-26 DIAGNOSIS — R55 Syncope and collapse: Secondary | ICD-10-CM

## 2020-03-26 DIAGNOSIS — Z8673 Personal history of transient ischemic attack (TIA), and cerebral infarction without residual deficits: Secondary | ICD-10-CM | POA: Diagnosis not present

## 2020-03-26 NOTE — Progress Notes (Signed)
Cardiology Office Note:    Date:  03/26/2020   ID:  Christina Lawson, DOB [birth year], MRN [id number]  PCP:  Gayland Curry, DO  Cardiologist:  Elouise Munroe, MD  Electrophysiologist:  None   Referring MD: Gayland Curry, DO   Chief Complaint/Reason for Referral: Orthostatic hypotension, syncope/falls, PAF on [facility]   History of Present Illness:    Christina Lawson is a 87 y.o. female with a history of hypertension, hypothyroidism, tremor, hyperlipidemia, prior TIA, and hospitalization in 12/2019 for syncopal episode found to have significant orthostatic hypotension. She was started on pyridostigmine bromide (Mestinon) and has been tolerating this well overall.  She presents to the visit alone today.  She is stable and has not had any falls since her last visit in October.  She denies dizziness or lightheadedness and is taking precautions with standing and uses a walker.  She is wearing compression stockings and has not had any lower extremity swelling.  She unfortunately lost her brother-in-law in December to [place] and heart issues.  He was a Holocaust survivor.  Due to pandemic restrictions she was unable to safely travel and see him.  She is understandably saddened by this event.  We reviewed indications and risks of continuing Eliquis in the setting of prior syncopal episode.  We participated in shared decision making and determined preventing stroke is her primary concern, and with no recent falls this is reasonable to continue.  Past Medical History:  Diagnosis Date  . Allergic rhinitis    pollen and grass  . Asthma   . Atrial fibrillation ([place]) 05/13/2018  . Atrial fibrillation ([place])   . Blepharitis   . Carpal tunnel syndrome, bilateral 07/03/2016  . Family history of adverse reaction to anesthesia    " [place]   . High cholesterol   . HOH (hard of hearing)   . Hyperlipidemia   . Irregular heart rhythm   . Macular  degeneration 2012  . Osteoporosis   . Sensorineural hearing loss, bilateral 2007  . Shingles 2004  . Skin cancer 2005   nose  . Syncope and collapse 12/11/2019  . TIA (transient ischemic attack)   . Torn meniscus 2006   right knee  . Tremor 2006   familiar.  dx by Dr. Quillian Quince  . Unspecified essential hypertension   . Unspecified hypothyroidism   . Urinary frequency     Past Surgical History:  Procedure Laterality Date  . ABDOMINAL HYSTERECTOMY  1977  . ABDOMINAL HYSTERECTOMY    . CATARACT EXTRACTION  1974   Left, IOL implants  . CATARACT EXTRACTION Right 1979   IOL implants  . CHOLECYSTECTOMY  1981  . CHOLECYSTECTOMY    . COSMETIC SURGERY  1995   eye  . [place]  . DILATION AND CURETTAGE OF UTERUS  1977  . EYE SURGERY    . [place]  . HIATAL HERNIA REPAIR  1977  . SKIN CANCER EXCISION  2005   nose  . THYROIDECTOMY  1955  . TONSILLECTOMY  1931  . TONSILLECTOMY      Current Medications: Current Meds  Medication Sig  . albuterol (VENTOLIN HFA) 108 (90 Base) MCG/ACT inhaler Inhale into the lungs every 6 (six) hours as needed for wheezing.  Marland Kitchen amLODipine (NORVASC) 5 MG tablet Take 1 tablet (5 mg total) by mouth daily.  Marland Kitchen apixaban (ELIQUIS) 2.5 MG TABS tablet Take 2.5 mg by mouth 2 (two) times  daily.  . azelastine (ASTELIN) 0.1 % nasal spray Place 1 spray into both nostrils 2 (two) times daily. Use in each nostril as directed  . BREO ELLIPTA 100-25 MCG/INH AEPB Inhale 1 puff into the lungs every evening.   . calcium carbonate (TUMS - DOSED IN MG ELEMENTAL CALCIUM) 500 MG chewable tablet Chew 1 tablet by mouth daily as needed for indigestion.  . Cholecalciferol (VITAMIN D) 2000 units CAPS Take 1 capsule (2,000 Units total) by mouth daily.  . diclofenac Sodium (VOLTAREN) 1 % GEL Apply topically as needed. MAR states: Dose: "Small amount", Frequency: "As needed", Instructions: "May keep at bedside and self administer"  . docusate sodium  (COLACE) 100 MG capsule Take 100 mg by mouth daily.  . fluticasone (FLONASE) 50 MCG/ACT nasal spray Place 2 sprays into both nostrils daily.  Marland Kitchen HYDROcodone-acetaminophen (NORCO/VICODIN) 5-325 MG tablet Take 0.5 tablets by mouth 2 (two) times daily as needed for severe pain.  Marland Kitchen loratadine (CLARITIN) 10 MG tablet Take 1 tablet (10 mg total) by mouth daily.  . metoprolol succinate (TOPROL-XL) 25 MG 24 hr tablet Take 12.5 mg by mouth daily.  . mirabegron ER (MYRBETRIQ) 50 MG TB24 tablet Take 1 tablet (50 mg total) by mouth daily.  . Multiple Vitamins-Minerals (PRESERVISION AREDS 2+MULTI VIT) CAPS Take 1 capsule by mouth 2 (two) times daily.  Marland Kitchen oxymetazoline (AFRIN) 0.05 % nasal spray Place 2 sprays into both nostrils 2 (two) times daily as needed for congestion.   Marland Kitchen pyridostigmine (MESTINON) 60 MG tablet Take 0.5 tablets (30 mg total) by mouth daily.  . sertraline (ZOLOFT) 50 MG tablet Take 50 mg by mouth daily.     Allergies:   Clindamycin/lincomycin, Clindamycin/lincomycin, Grass pollen([id number]-[id number]-swt vern), Ibuprofen, Ibuprofen, Other, Penicillins, and Penicillins   Social History   Tobacco Use  . Smoking status: Never Smoker  . Smokeless tobacco: Never Used  Vaping Use  . Vaping Use: Never used  Substance Use Topics  . Alcohol use: Not Currently    Comment: on occ.  . Drug use: Never     Family History: The patient's family history includes Allergies in her father; Asthma in her father; Brain cancer in her mother; Breast cancer in her cousin, cousin, maternal aunt, maternal aunt, paternal aunt, and sister; Breast cancer (age of onset: 56) in her sister; Breast cancer (age of onset: 18) in her daughter; Cancer in her mother, sister, and sister; Emphysema in her father; Endometrial cancer in her sister; Heart disease in her maternal grandfather; Hypertension in her mother; Stroke in an other family member.  ROS:   Please see the history of present illness.    All other systems reviewed and  are negative.  EKGs/Labs/Other Studies Reviewed:    The following studies were reviewed today:  EKG: Sinus bradycardia rate 59, left axis deviation, septal and inferior infarct pattern.  Recent Labs: 12/10/2019: TSH 3.945 12/11/2019: ALT 19; BUN 12; Creatinine, Ser 0.72; Hemoglobin 13.3; Platelets 211; Potassium 3.7; Sodium 136  Recent Lipid Panel    Component Value Date/Time   CHOL 211 (H) 05/14/2018 0219   TRIG 61 05/14/2018 0219   HDL 53 05/14/2018 0219   CHOLHDL 4.0 05/14/2018 0219   VLDL 12 05/14/2018 0219   LDLCALC 146 (H) 05/14/2018 0219    Physical Exam:    VS:  BP 140/70 (BP Location: Left Arm, Patient Position: Sitting)   Ht 5' (1.524 m)   Wt 136 lb 9.6 oz (62 kg)   SpO2 95%   BMI 26.68 kg/m  Wt Readings from Last 5 Encounters:  03/26/20 136 lb 9.6 oz (62 kg)  01/07/20 134 lb 6.4 oz (61 kg)  12/24/19 132 lb (59.9 kg)  12/16/19 117 lb 15.2 oz (53.5 kg)  12/15/19 117 lb 15.1 oz (53.5 kg)    Constitutional: No acute distress Eyes: sclera non-icteric, normal conjunctiva and lids ENMT: Mask in place during COVID-19 omicron surge Cardiovascular: regular rhythm, normal rate, no murmurs. S1 and S2 normal. Radial pulses normal bilaterally. No jugular venous distention.  Respiratory: clear to auscultation bilaterally GI : normal bowel sounds, soft and nontender. No distention.   MSK: extremities warm, well perfused. No edema.  NEURO: grossly nonfocal exam, moves all extremities. PSYCH: alert and oriented x 3, normal mood and affect.   ASSESSMENT:    1. Paroxysmal atrial fibrillation (HCC)   2. Syncope, unspecified syncope type   3. H/O TIA (transient ischemic attack) and stroke   4. Essential hypertension   5. Pure hypercholesterolemia    PLAN:    Paroxysmal atrial fibrillation ([place]) - Plan: EKG 12-Lead Syncope, unspecified syncope type H/O TIA (transient ischemic attack) and stroke -She will continue Eliquis 2.5 mg twice daily at this time after shared  decision-making on risks and benefits of continued anticoagulation.  No bleeding complications.  CHA2DS2-VASc score is 6 (hypertension, female, age x2, prior TIA (2 points)). -Okay to continue low-dose metoprolol succinate 12.5 mg daily, hold for heart rates under 50 bpm  Essential hypertension -Continue amlodipine 5 mg daily, metoprolol succinate 12.5 mg daily  Pure hypercholesterolemia -Not currently on therapy, observing due to age.  Total time of encounter: 30 minutes total time of encounter, including 20 minutes spent in face-to-face patient care on the date of this encounter. This time includes coordination of care and counseling regarding above mentioned problem list. Remainder of non-face-to-face time involved reviewing chart documents/testing relevant to the patient encounter and documentation in the medical record. I have independently reviewed documentation from referring provider.   Cherlynn Kaiser, MD [place]  [facility]    Medication Adjustments/Labs and Tests Ordered: Current medicines are reviewed at length with the patient today.  Concerns regarding medicines are outlined above.   Orders Placed This Encounter  Procedures  . EKG 12-Lead    No orders of the defined types were placed in this encounter.   Patient Instructions  Medication Instructions:  No Changes In Medications at this time.  *If you need a refill on your cardiac medications before your next appointment, please call your pharmacy*  Follow-Up: At [facility], you and your health needs are our priority.  As part of our continuing mission to provide you with exceptional heart care, we have created designated Provider Care Teams.  These Care Teams include your primary Cardiologist (physician) and Advanced Practice Providers (APPs -  Physician Assistants and Nurse Practitioners) who all work together to provide you with the care you need, when you need it.  Your next appointment:   6  month(s)  The format for your next appointment:   In Person  Provider:   Cherlynn Kaiser, MD

## 2020-03-26 NOTE — Patient Instructions (Signed)

## 2020-04-07 ENCOUNTER — Non-Acute Institutional Stay: Payer: Medicare Other | Admitting: Internal Medicine

## 2020-04-07 ENCOUNTER — Encounter: Payer: Self-pay | Admitting: Internal Medicine

## 2020-04-07 ENCOUNTER — Other Ambulatory Visit: Payer: Self-pay

## 2020-04-07 VITALS — BP 138/78 | HR 65 | Temp 97.3°F | Ht 61.0 in

## 2020-04-07 DIAGNOSIS — J4540 Moderate persistent asthma, uncomplicated: Secondary | ICD-10-CM | POA: Diagnosis not present

## 2020-04-07 DIAGNOSIS — N3281 Overactive bladder: Secondary | ICD-10-CM

## 2020-04-07 DIAGNOSIS — K5904 Chronic idiopathic constipation: Secondary | ICD-10-CM | POA: Diagnosis not present

## 2020-04-07 DIAGNOSIS — I951 Orthostatic hypotension: Secondary | ICD-10-CM

## 2020-04-07 DIAGNOSIS — M79605 Pain in left leg: Secondary | ICD-10-CM | POA: Diagnosis not present

## 2020-04-07 DIAGNOSIS — G3184 Mild cognitive impairment, so stated: Secondary | ICD-10-CM

## 2020-04-07 DIAGNOSIS — N3946 Mixed incontinence: Secondary | ICD-10-CM | POA: Diagnosis not present

## 2020-04-07 MED ORDER — SENNOSIDES-DOCUSATE SODIUM 8.6-50 MG PO TABS: 2.0000 | ORAL_TABLET | Freq: Every day | ORAL | 3 refills | Status: DC

## 2020-04-07 NOTE — Progress Notes (Signed)
Location:  Occupational psychologist of Service:  Clinic (12)  Provider: Zailee Vallely L. Mariea Clonts, D.O., C.M.D.  Code Status: DNR Goals of Care:  Advanced Directives 04/07/2020  Does Patient Have a Medical Advance Directive? Yes  Type of Advance Directive Out of facility DNR (pink MOST or yellow form)  Does patient want to make changes to medical advance directive? No - Patient declined  Copy of [place] in Chart? -  Pre-existing out of facility DNR order (yellow form or pink MOST form) -     Chief Complaint  Patient presents with  . Medical Management of Chronic Issues    4 month follow up. Discuss compression stockings, incontinence medication which is not working. She stated she goes to the restroom almost 20 times a day. Her bowel movements has changed , although she goes every morning they now come out in balls. Her eyes sight and hearing is worse.     HPI: Patient is a 87 y.o. female seen today for medical management of chronic diseases.    Weight gain--up into upper 130s.  Says she'll have to stop desserts for a while.    Says her breathing is not great.  Admits to heavy breathing sometimes.  No change she notes.    Incontinence worse.  Still urinates frequently, has to hurry to get there.  Wears a liner which has worked adequately thus far.  She has started doing the kegel exercises again, but so far does not notice any effect.    BM each am but it's little tiny balls.  Does not take medication for constipation.  Feels relief afterward.  Bottom is a little painful.  Is taking a single colace daily.  She is drinking more than she did before at meals--tries to drink as much water as she can.  Not a big water drinker though.  Does not finish an Community education officer separately.  Does drink water with meals, has one cup of coffee and 8oz milk.  She is still doing her walking and exercising with Robin in the am.  Admits she sits a lot.  Does jigsaws and reads and  watches tv.    She wears the thigh high compression stockings.  Really needs it only on the left leg so wants to stop the right leg one.                                                                                       Past Medical History:  Diagnosis Date  . Allergic rhinitis    pollen and grass  . Asthma   . Atrial fibrillation ([place]) 05/13/2018  . Atrial fibrillation ([place])   . Blepharitis   . Carpal tunnel syndrome, bilateral 07/03/2016  . Family history of adverse reaction to anesthesia    " [place]   . High cholesterol   . HOH (hard of hearing)   . Hyperlipidemia   . Irregular heart rhythm   . Macular degeneration 2012  . Osteoporosis   . Sensorineural hearing loss, bilateral 2007  . Shingles 2004  . Skin cancer  2005   nose  . Syncope and collapse 12/11/2019  . TIA (transient ischemic attack)   . Torn meniscus 2006   right knee  . Tremor 2006   familiar.  dx by Dr. Quillian Quince  . Unspecified essential hypertension   . Unspecified hypothyroidism   . Urinary frequency     Past Surgical History:  Procedure Laterality Date  . ABDOMINAL HYSTERECTOMY  1977  . ABDOMINAL HYSTERECTOMY    . CATARACT EXTRACTION  1974   Left, IOL implants  . CATARACT EXTRACTION Right 1979   IOL implants  . CHOLECYSTECTOMY  1981  . CHOLECYSTECTOMY    . COSMETIC SURGERY  1995   eye  . [place]  . DILATION AND CURETTAGE OF UTERUS  1977  . EYE SURGERY    . [place]  . HIATAL HERNIA REPAIR  1977  . SKIN CANCER EXCISION  2005   nose  . THYROIDECTOMY  1955  . TONSILLECTOMY  1931  . TONSILLECTOMY      Allergies  Allergen Reactions  . Clindamycin/Lincomycin Diarrhea    diarrhea  . Clindamycin/Lincomycin     Listed on MAR -- reaction unknown  . Grass Pollen(K-O-R-T-Swt Vern)     Listed on MAR -- reaction unknown  . Ibuprofen Other (See Comments)    Long time ago/ list on paper work  . Ibuprofen      Listed on MAR -- reaction unknown  . Other     Preparation H Listed on MAR -- reaction unknown  . Penicillins Other (See Comments)    Did it involve swelling of the face/tongue/throat, SOB, or low BP? Unknown Did it involve sudden or severe rash/hives, skin peeling, or any reaction on the inside of your mouth or nose? Unknown Did you need to seek medical attention at a hospital or doctor's office? Unknown When did it last happen? If all above answers are "NO", may proceed with cephalosporin use.  Marland Kitchen Penicillins     Listed on MAR -- reaction unknown    Outpatient Encounter Medications as of 04/07/2020  Medication Sig  . albuterol (VENTOLIN HFA) 108 (90 Base) MCG/ACT inhaler Inhale into the lungs every 6 (six) hours as needed for wheezing.  Marland Kitchen amLODipine (NORVASC) 5 MG tablet Take 1 tablet (5 mg total) by mouth daily.  Marland Kitchen apixaban (ELIQUIS) 2.5 MG TABS tablet Take 2.5 mg by mouth 2 (two) times daily.  Marland Kitchen azelastine (ASTELIN) 0.1 % nasal spray Place 1 spray into both nostrils 2 (two) times daily. Use in each nostril as directed  . BREO ELLIPTA 100-25 MCG/INH AEPB Inhale 1 puff into the lungs every evening.   . calcium carbonate (TUMS - DOSED IN MG ELEMENTAL CALCIUM) 500 MG chewable tablet Chew 1 tablet by mouth daily as needed for indigestion.  . Cholecalciferol (VITAMIN D) 2000 units CAPS Take 1 capsule (2,000 Units total) by mouth daily.  . diclofenac Sodium (VOLTAREN) 1 % GEL Apply topically as needed. MAR states: Dose: "Small amount", Frequency: "As needed", Instructions: "May keep at bedside and self administer"  . fluticasone (FLONASE) 50 MCG/ACT nasal spray Place 2 sprays into both nostrils daily.  Marland Kitchen HYDROcodone-acetaminophen (NORCO/VICODIN) 5-325 MG tablet Take 0.5 tablets by mouth 2 (two) times daily as needed for severe pain.  Marland Kitchen loratadine (CLARITIN) 10 MG tablet Take 1 tablet (10 mg total) by mouth daily.  . metoprolol succinate (TOPROL-XL) 25 MG 24 hr tablet Take 12.5 mg by mouth  daily.  . Multiple Vitamins-Minerals (PRESERVISION AREDS  2+MULTI VIT) CAPS Take 1 capsule by mouth 2 (two) times daily.  Marland Kitchen oxymetazoline (AFRIN) 0.05 % nasal spray Place 2 sprays into both nostrils 2 (two) times daily as needed for congestion.   Marland Kitchen pyridostigmine (MESTINON) 60 MG tablet Take 0.5 tablets (30 mg total) by mouth daily.  Marland Kitchen senna-docusate (SENOKOT S) 8.6-50 MG tablet Take 2 tablets by mouth daily.  . sertraline (ZOLOFT) 50 MG tablet Take 50 mg by mouth daily.  . [DISCONTINUED] docusate sodium (COLACE) 100 MG capsule Take 100 mg by mouth daily.  . [DISCONTINUED] mirabegron ER (MYRBETRIQ) 50 MG TB24 tablet Take 1 tablet (50 mg total) by mouth daily.   No facility-administered encounter medications on file as of 04/07/2020.    Review of Systems:  Review of Systems  Constitutional: Negative for chills and fever.  HENT: Positive for hearing loss. Negative for congestion and sore throat.   Eyes: Positive for blurred vision.  Respiratory: Negative for shortness of breath and wheezing.   Cardiovascular: Positive for leg swelling. Negative for chest pain and palpitations.       Left leg only  Gastrointestinal: Negative for abdominal pain.       Hard stools  Genitourinary: Positive for frequency and urgency. Negative for dysuria.       OAB, incontinence  Musculoskeletal: Negative for falls and joint pain.  Skin: Negative for rash.  Neurological: Negative for dizziness and loss of consciousness.  Endo/Heme/Allergies: Bruises/bleeds easily.  Psychiatric/Behavioral: Positive for memory loss. The patient is nervous/anxious.     Health Maintenance  Topic Date Due  . COVID-19 Vaccine (3 - Booster for Moderna series) 10/20/2019  . TETANUS/TDAP  04/07/2025  . INFLUENZA VACCINE  Completed  . DEXA SCAN  Completed  . PNA vac Low Risk Adult  Completed    Physical Exam: Vitals:   04/07/20 0944  BP: 138/78  Pulse: 65  Temp: (!) 97.3 F (36.3 C)  SpO2: 97%  Height: 5\' 1"  (1.549 m)    Body mass index is 25.81 kg/m. Physical Exam Vitals reviewed.  Constitutional:      Appearance: Normal appearance.  HENT:     Head: Normocephalic and atraumatic.     Ears:     Comments: Hearing aids, HOH, no cerumen impaction Eyes:     Comments: glasses  Cardiovascular:     Pulses: Normal pulses.     Heart sounds: Normal heart sounds.  Pulmonary:     Effort: Pulmonary effort is normal.     Breath sounds: Normal breath sounds. No wheezing or rales.  Abdominal:     General: Bowel sounds are normal.  Musculoskeletal:        General: Normal range of motion.     Right lower leg: No edema.     Left lower leg: No edema.  Neurological:     General: No focal deficit present.     Mental Status: She is alert and oriented to person, place, and time.     Gait: Gait normal.  Psychiatric:        Mood and Affect: Mood normal.        Behavior: Behavior normal.     Labs reviewed: Basic Metabolic Panel: Recent Labs    07/18/19 0300 12/10/19 1203 12/10/19 1900 12/11/19 0445  NA 141 139  --  136  K 4.7 3.9  --  3.7  CL 105 105  --  102  CO2 24* 25  --  24  GLUCOSE  --  123*  --  108*  BUN 17 18  --  12  CREATININE 0.7 0.86 0.72 0.72  CALCIUM 9.4 9.4  --  9.1  TSH  --  3.945  --   --    Liver Function Tests: Recent Labs    07/18/19 0300 12/10/19 1203 12/11/19 0445  AST 16 23 27   ALT 13 16 19   ALKPHOS  --  47 54  BILITOT  --  0.9 1.3*  PROT  --  6.7 6.5  ALBUMIN 3.7 3.5 3.3*   No results for input(s): LIPASE, AMYLASE in the last 8760 hours. No results for input(s): AMMONIA in the last 8760 hours. CBC: Recent Labs    07/18/19 0000 07/18/19 0300 12/10/19 1900 12/11/19 0445  WBC 4.9 4.9 9.7 8.5  NEUTROABS  --   --  7.6  --   HGB 12.5  --  13.1 13.3  HCT 37  --  39.8 39.8  MCV  --   --  95.2 93.0  PLT 191  --  229 211   Lipid Panel: No results for input(s): CHOL, HDL, LDLCALC, TRIG, CHOLHDL, LDLDIRECT in the last 8760 hours. Lab Results  Component Value  Date   HGBA1C 5.9 10/02/2017    Procedures since last visit: No results found.  Assessment/Plan 1. Left leg pain -intermittent neuropathic just above knee area, this one also swells some--it's never been clear what this is -cont compression stocking on this leg--wants to try w/o right one due to it being a nuisance to put them on and take them off--we'll try but they were also for her orthostasis so if that gets worse, we'll have to put it back  2. OAB (overactive bladder) -myrbetriq ineffective for her and other meds are not good options due to side effects, doing kegels but not noting benefit -use adult undergarments and frequent trips to restroom  3. Autonomic orthostatic hypotension -better lately, no dizziness, no falls  4. Moderate persistent asthma without complication -stable, cont same regimen and monitor--she seemed a little more dyspneic to me but she denies feeling this and she had no wheezing  5. Mild cognitive impairment with memory loss -getting a bit worse, does not seem to retain things b/w visits now like she did and she has to write herself a lot more notes -plans to get new hearing aids which may be helpful  6. Mixed incontinence urge and stress -ongoing, encouraged frequent toileting, adult undergarments, kegels as meds outside of myrbetriq not safe with her other conditions and may worsen cognitive status  7. Chronic idiopathic constipation -increase stool softener and monitor  Labs/tests ordered:  No new Next appt:  08/04/2020   Yudit Modesitt L. Maxi Rodas, D.O. [facility] [address], [place] [address] Cell Phone (Mon-Fri 8am-5pm):  [phone] On Call:  [phone] & follow prompts after 5pm & weekends Office Phone:  [phone] Office Fax:  [phone]

## 2020-04-28 DIAGNOSIS — Z85828 Personal history of other malignant neoplasm of skin: Secondary | ICD-10-CM | POA: Diagnosis not present

## 2020-04-28 DIAGNOSIS — L570 Actinic keratosis: Secondary | ICD-10-CM | POA: Diagnosis not present

## 2020-04-28 DIAGNOSIS — D692 Other nonthrombocytopenic purpura: Secondary | ICD-10-CM | POA: Diagnosis not present

## 2020-04-28 DIAGNOSIS — L814 Other melanin hyperpigmentation: Secondary | ICD-10-CM | POA: Diagnosis not present

## 2020-05-03 ENCOUNTER — Encounter: Payer: Self-pay | Admitting: Internal Medicine

## 2020-07-21 ENCOUNTER — Encounter: Payer: Self-pay | Admitting: Internal Medicine

## 2020-07-21 ENCOUNTER — Non-Acute Institutional Stay: Payer: Medicare Other | Admitting: Internal Medicine

## 2020-07-21 ENCOUNTER — Other Ambulatory Visit: Payer: Self-pay

## 2020-07-21 VITALS — BP 150/88 | HR 67 | Temp 96.8°F | Ht 61.0 in | Wt 139.4 lb

## 2020-07-21 DIAGNOSIS — I480 Paroxysmal atrial fibrillation: Secondary | ICD-10-CM

## 2020-07-21 DIAGNOSIS — N3946 Mixed incontinence: Secondary | ICD-10-CM

## 2020-07-21 DIAGNOSIS — K5904 Chronic idiopathic constipation: Secondary | ICD-10-CM

## 2020-07-21 DIAGNOSIS — L821 Other seborrheic keratosis: Secondary | ICD-10-CM | POA: Diagnosis not present

## 2020-07-21 DIAGNOSIS — I10 Essential (primary) hypertension: Secondary | ICD-10-CM

## 2020-07-21 DIAGNOSIS — I951 Orthostatic hypotension: Secondary | ICD-10-CM | POA: Diagnosis not present

## 2020-07-21 DIAGNOSIS — D1800 Hemangioma unspecified site: Secondary | ICD-10-CM | POA: Diagnosis not present

## 2020-07-21 DIAGNOSIS — Z85828 Personal history of other malignant neoplasm of skin: Secondary | ICD-10-CM | POA: Diagnosis not present

## 2020-07-21 DIAGNOSIS — L814 Other melanin hyperpigmentation: Secondary | ICD-10-CM | POA: Diagnosis not present

## 2020-07-21 DIAGNOSIS — J4540 Moderate persistent asthma, uncomplicated: Secondary | ICD-10-CM | POA: Diagnosis not present

## 2020-07-21 DIAGNOSIS — L570 Actinic keratosis: Secondary | ICD-10-CM | POA: Diagnosis not present

## 2020-07-21 DIAGNOSIS — F339 Major depressive disorder, recurrent, unspecified: Secondary | ICD-10-CM

## 2020-07-21 DIAGNOSIS — G3184 Mild cognitive impairment, so stated: Secondary | ICD-10-CM

## 2020-07-21 DIAGNOSIS — G250 Essential tremor: Secondary | ICD-10-CM | POA: Diagnosis not present

## 2020-07-21 DIAGNOSIS — D692 Other nonthrombocytopenic purpura: Secondary | ICD-10-CM | POA: Diagnosis not present

## 2020-07-21 DIAGNOSIS — G459 Transient cerebral ischemic attack, unspecified: Secondary | ICD-10-CM | POA: Diagnosis not present

## 2020-07-21 NOTE — Progress Notes (Signed)
Location:  [facility] of Service:  Clinic (12)  Provider:   Code Status: DNR Goals of Care:  Advanced Directives 07/21/2020  Does Patient Have a Medical Advance Directive? Yes  Type of Advance Directive Out of facility DNR (pink MOST or yellow form);Living will  Does patient want to make changes to medical advance directive? No - Patient declined  Copy of [place] in Chart? -  Pre-existing out of facility DNR order (yellow form or pink MOST form) Yellow form placed in chart (order not valid for inpatient use);Pink MOST form placed in chart (order not valid for inpatient use)     Chief Complaint  Patient presents with  . Medical Management of Chronic Issues    Patient returns to the clinic for her 4 month follow up. Patient lives in [place]. She would like to discuss her abdominal belt not fitting and her hearing.     HPI: Patient is a 87 y.o. female seen today for medical management of chronic diseases.   Patient has a history of PAF on Eliquis History of TIA and stroke Essential hypertension, hyperlipidemia History of autoimmune orthostatic hypotension causing syncope Chronic constipation, history of urinary incontinence has failed Myrbetriq History of essential tremors per neurology  Patient lives in assisted living.  Uses walker to walk.  Needs help with dressing and med management but otherwise is independent. She wanted to know if she can stop wearing abdominal binder for her hyportension.  She wears thigh-high TED hoses. She also wanted me to know that she is having difficult time with her hearing and it is not going to improve.  She is also having vision loss. But overall her mood is stable. No other issues today   Past Medical History:  Diagnosis Date  . Allergic rhinitis    pollen and grass  . Asthma   . Atrial fibrillation ([place]) 05/13/2018  . Atrial fibrillation ([place])   . Blepharitis   . Carpal tunnel  syndrome, bilateral 07/03/2016  . Family history of adverse reaction to anesthesia    " [place] ([place] ) Marvin   . High cholesterol   . HOH (hard of hearing)   . Hyperlipidemia   . Irregular heart rhythm   . Macular degeneration 2012  . Osteoporosis   . Sensorineural hearing loss, bilateral 2007  . Shingles 2004  . Skin cancer 2005   nose  . Syncope and collapse 12/11/2019  . TIA (transient ischemic attack)   . Torn meniscus 2006   right knee  . Tremor 2006   familiar.  dx by Dr. Quillian Quince  . Unspecified essential hypertension   . Unspecified hypothyroidism   . Urinary frequency     Past Surgical History:  Procedure Laterality Date  . ABDOMINAL HYSTERECTOMY  1977  . ABDOMINAL HYSTERECTOMY    . CATARACT EXTRACTION  1974   Left, IOL implants  . CATARACT EXTRACTION Right 1979   IOL implants  . CHOLECYSTECTOMY  1981  . CHOLECYSTECTOMY    . COSMETIC SURGERY  1995   eye  . [place]  . DILATION AND CURETTAGE OF UTERUS  1977  . EYE SURGERY    . [place]  . HIATAL HERNIA REPAIR  1977  . SKIN CANCER EXCISION  2005   nose  . THYROIDECTOMY  1955  . TONSILLECTOMY  1931  . TONSILLECTOMY      Allergies  Allergen Reactions  .  Clindamycin/Lincomycin Diarrhea    diarrhea  . Clindamycin/Lincomycin     Listed on MAR -- reaction unknown  . Grass Pollen(K-O-[id number]-Swt Vern)     Listed on MAR -- reaction unknown  . Ibuprofen Other (See Comments)    Long time ago/ list on paper work  . Ibuprofen     Listed on MAR -- reaction unknown  . Other     Preparation H Listed on MAR -- reaction unknown  . Penicillins Other (See Comments)    Did it involve swelling of the face/tongue/throat, SOB, or low BP? Unknown Did it involve sudden or severe rash/hives, skin peeling, or any reaction on the inside of your mouth or nose? Unknown Did you need to seek medical attention at a hospital or doctor's office? Unknown When did it  last happen? If all above answers are "NO", may proceed with cephalosporin use.  Marland Kitchen Penicillins     Listed on MAR -- reaction unknown    Outpatient Encounter Medications as of 07/21/2020  Medication Sig  . acetaminophen (TYLENOL) 325 MG tablet Take 650 mg by mouth as needed.  Marland Kitchen albuterol (VENTOLIN HFA) 108 (90 Base) MCG/ACT inhaler Inhale into the lungs every 6 (six) hours as needed for wheezing.  Marland Kitchen amLODipine (NORVASC) 5 MG tablet Take 1 tablet (5 mg total) by mouth daily.  Marland Kitchen apixaban (ELIQUIS) 2.5 MG TABS tablet Take 2.5 mg by mouth 2 (two) times daily.  Marland Kitchen azelastine (ASTELIN) 0.1 % nasal spray Place 1 spray into both nostrils 2 (two) times daily. Use in each nostril as directed  . BREO ELLIPTA 100-25 MCG/INH AEPB Inhale 1 puff into the lungs every evening.   . calcium carbonate (TUMS - DOSED IN MG ELEMENTAL CALCIUM) 500 MG chewable tablet Chew 1 tablet by mouth daily as needed for indigestion.  . Cholecalciferol (VITAMIN D) 2000 units CAPS Take 1 capsule (2,000 Units total) by mouth daily.  . diclofenac Sodium (VOLTAREN) 1 % GEL Apply topically as needed. MAR states: Dose: "Small amount", Frequency: "As needed", Instructions: "May keep at bedside and self administer"  . fluticasone (FLONASE) 50 MCG/ACT nasal spray Place 2 sprays into both nostrils daily.  Marland Kitchen HYDROcodone-acetaminophen (NORCO/VICODIN) 5-325 MG tablet Take 0.5 tablets by mouth 2 (two) times daily as needed for severe pain.  Marland Kitchen loratadine (CLARITIN) 10 MG tablet Take 1 tablet (10 mg total) by mouth daily.  . metoprolol succinate (TOPROL-XL) 25 MG 24 hr tablet Take 12.5 mg by mouth daily.  . Multiple Vitamins-Minerals (PRESERVISION AREDS 2+MULTI VIT) CAPS Take 1 capsule by mouth 2 (two) times daily.  Marland Kitchen oxymetazoline (AFRIN) 0.05 % nasal spray Place 2 sprays into both nostrils 2 (two) times daily as needed for congestion.   Vladimir Faster Glycol-Propyl Glycol (SYSTANE) 0.4-0.3 % SOLN Apply to eye.  . pyridostigmine (MESTINON) 60  MG tablet Take 0.5 tablets (30 mg total) by mouth daily.  Marland Kitchen senna-docusate (SENOKOT S) 8.6-50 MG tablet Take 2 tablets by mouth daily.  . sertraline (ZOLOFT) 50 MG tablet Take 50 mg by mouth daily.   No facility-administered encounter medications on file as of 07/21/2020.    Review of Systems:  Review of Systems  Constitutional: Negative.   HENT: Positive for hearing loss.   Eyes: Positive for visual disturbance.  Respiratory: Negative.   Cardiovascular: Positive for leg swelling.  Gastrointestinal: Positive for constipation.  Genitourinary: Positive for frequency.  Musculoskeletal: Positive for gait problem.  Psychiatric/Behavioral: Positive for confusion.    Health Maintenance  Topic Date Due  .  INFLUENZA VACCINE  10/11/2020  . TETANUS/TDAP  04/07/2025  . DEXA SCAN  Completed  . COVID-19 Vaccine  Completed  . PNA vac Low Risk Adult  Completed  . HPV VACCINES  Aged Out    Physical Exam: Vitals:   07/21/20 0940  BP: (!) 150/88  Pulse: 67  Temp: (!) 96.8 F (36 C)  SpO2: 98%  Weight: 139 lb 6.4 oz (63.2 kg)  Height: 5\' 1"  (1.549 m)   Body mass index is 26.34 kg/m. Physical Exam  Labs reviewed: Basic Metabolic Panel: Recent Labs    12/10/19 1203 12/10/19 1900 12/11/19 0445  NA 139  --  136  K 3.9  --  3.7  CL 105  --  102  CO2 25  --  24  GLUCOSE 123*  --  108*  BUN 18  --  12  CREATININE 0.86 0.72 0.72  CALCIUM 9.4  --  9.1  TSH 3.945  --   --    Liver Function Tests: Recent Labs    12/10/19 1203 12/11/19 0445  AST 23 27  ALT 16 19  ALKPHOS 47 54  BILITOT 0.9 1.3*  PROT 6.7 6.5  ALBUMIN 3.5 3.3*   No results for input(s): LIPASE, AMYLASE in the last 8760 hours. No results for input(s): AMMONIA in the last 8760 hours. CBC: Recent Labs    12/10/19 1900 12/11/19 0445  WBC 9.7 8.5  NEUTROABS 7.6  --   HGB 13.1 13.3  HCT 39.8 39.8  MCV 95.2 93.0  PLT 229 211   Lipid Panel: No results for input(s): CHOL, HDL, LDLCALC, TRIG, CHOLHDL,  LDLDIRECT in the last 8760 hours. Lab Results  Component Value Date   HGBA1C 5.9 10/02/2017    Procedures since last visit: No results found.  Assessment/Plan 1. 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 Also uses Thigh High Ted hoses Will Discontinue Abdominal BInder  2. Moderate persistent asthma without complication Follows with Allergy and Immunology On Breo And Astelin and Flonase  3. Mixed incontinence urge and stress Has failed Myrbetriq Does not want to try anything else  4. Chronic idiopathic constipation It seems Senna has helped Also told to try Prune juice  5. Mild cognitive impairment with memory loss Doing well in AL Does Read and Puzzles  6. Essential hypertension Loose Control on Norvasc and Lopressor  7. Tremor, essential Seen Dr Jannifer Franklin  8. TIA (transient ischemic attack) On Eliquis 9 PAF On Lopressor and Eliquis 10 Depression Doing well on Zoloft  Labs/tests ordered:   CBC,CMP,TSH

## 2020-07-22 DIAGNOSIS — I517 Cardiomegaly: Secondary | ICD-10-CM | POA: Diagnosis not present

## 2020-07-22 LAB — COMPREHENSIVE METABOLIC PANEL
Albumin: 3.9 (ref 3.5–5.0)
Calcium: 9.4 (ref 8.7–10.7)
GFR calc Af Amer: 75.3
GFR calc non Af Amer: 64.97
Globulin: 2.4

## 2020-07-22 LAB — BASIC METABOLIC PANEL
BUN: 16 (ref 4–21)
CO2: 27 — AB (ref 13–22)
Chloride: 104 (ref 99–108)
Creatinine: 0.8 (ref 0.5–1.1)
Glucose: 88
Potassium: 4.9 (ref 3.4–5.3)
Sodium: 140 (ref 137–147)

## 2020-07-22 LAB — CBC AND DIFFERENTIAL
HCT: 37 (ref 36–46)
Hemoglobin: 12.7 (ref 12.0–16.0)
Neutrophils Absolute: 3
Platelets: 215 (ref 150–399)
WBC: 5

## 2020-07-22 LAB — HEPATIC FUNCTION PANEL
ALT: 12 (ref 7–35)
AST: 17 (ref 13–35)
Alkaline Phosphatase: 71 (ref 25–125)
Bilirubin, Total: 0.6

## 2020-07-22 LAB — TSH: TSH: 3.5 (ref 0.41–5.90)

## 2020-07-22 LAB — CBC: RBC: 3.93 (ref 3.87–5.11)

## 2020-08-02 DIAGNOSIS — I517 Cardiomegaly: Secondary | ICD-10-CM | POA: Diagnosis not present

## 2020-08-02 DIAGNOSIS — I10 Essential (primary) hypertension: Secondary | ICD-10-CM | POA: Diagnosis not present

## 2020-08-02 DIAGNOSIS — R609 Edema, unspecified: Secondary | ICD-10-CM | POA: Diagnosis not present

## 2020-08-02 LAB — BASIC METABOLIC PANEL
BUN: 15 (ref 4–21)
CO2: 24 — AB (ref 13–22)
Chloride: 103 (ref 99–108)
Creatinine: 0.8 (ref 0.5–1.1)
Glucose: 150
Potassium: 4.6 (ref 3.4–5.3)
Sodium: 141 (ref 137–147)

## 2020-08-02 LAB — COMPREHENSIVE METABOLIC PANEL
Albumin: 4.2 (ref 3.5–5.0)
Calcium: 9.8 (ref 8.7–10.7)
Globulin: 2.5

## 2020-08-02 LAB — CBC: RBC: 4.11 (ref 3.87–5.11)

## 2020-08-02 LAB — HEPATIC FUNCTION PANEL
ALT: 12 (ref 7–35)
AST: 17 (ref 13–35)
Alkaline Phosphatase: 67 (ref 25–125)
Bilirubin, Total: 0.3

## 2020-08-02 LAB — CBC AND DIFFERENTIAL
HCT: 38 (ref 36–46)
Hemoglobin: 13.2 (ref 12.0–16.0)
Platelets: 250 (ref 150–399)
WBC: 5.6

## 2020-08-03 ENCOUNTER — Telehealth (INDEPENDENT_AMBULATORY_CARE_PROVIDER_SITE_OTHER): Payer: Medicare Other | Admitting: Neurology

## 2020-08-03 ENCOUNTER — Encounter: Payer: Self-pay | Admitting: Neurology

## 2020-08-03 ENCOUNTER — Other Ambulatory Visit: Payer: Self-pay

## 2020-08-03 DIAGNOSIS — G250 Essential tremor: Secondary | ICD-10-CM

## 2020-08-03 NOTE — Progress Notes (Signed)
    Virtual Visit via Telephone Note  I connected with Christina Lawson on 08/03/20 at  1:45 PM EDT by telephone and verified that I am speaking with the correct person using two identifiers.   I discussed the limitations, risks, security and privacy concerns of performing an evaluation and management service by telephone and the availability of in person appointments. I also discussed with the patient that there may be a patient responsible charge related to this service. The patient expressed understanding and agreed to proceed.   History of Present Illness: 08/03/2020 SS: Christina Lawson is a 87 year old female with history of essential tremor, resides in assisted living at [facility].  Claims the tremor is stable, in the right and the left hand, now feels more in the right hand.  Notices with handwriting or eating.  She has not had any falls.  Zoloft is helping with her anxiety, is on metoprolol.  No longer on Xanax PRN for tremor.  Continues to do exercise classes.  Is not going to the pool.  Would like to be followed to ensure she does not develop Parkinson's disease.  Denies any major changes to her overall health.  Here today for evaluation via telephone visit.  Is on Eliquis for history of A. fib.  08/04/2019 SS: Christina Lawson 87 year old female with history of essential tremor.  The patient lives at [facility].  She is on metoprolol for A. Fib, and Eliquis.  Since last seen, she has moved into AL, she says, "I knew it was time", getting older and had a few falls.  She has good and bad days with a tremor, in both hands, left greater than right.  Is worse during times of anxiety or stress.  She is on Zoloft for anxiety.  She is right-handed, may have difficulty with handwriting and with feeding.  She has not had recent fall.  She remains active, the facility activity director does virtual exercise classes daily, walking is limited in [place] due to social distance restrictions.  She was going to the  pool, but is now too far away for her.  Was previously on alprazolam as needed for tremor, but was stopped after she was hospitalized last year, wonders about going back on as needed for tremor.  She presents today for evaluation unaccompanied.   Observations/Objective: Via telephone visit, is alert, oriented, is hard of hearing, fairly good historian  Assessment and Plan: 1.  Essential tremor  -Reports is overall stable -We are not prescribing any medications for tremor -Is on Zoloft, metoprolol which may benefit the tremor -Will continue to follow on an annual basis  Follow Up Instructions: 1 year Aug 09, 2021 11:15   I discussed the assessment and treatment plan with the patient. The patient was provided an opportunity to ask questions and all were answered. The patient agreed with the plan and demonstrated an understanding of the instructions.   The patient was advised to call back or seek an in-person evaluation if the symptoms worsen or if the condition fails to improve as anticipated.  I spent 12 minutes in this telephone encounter.  Evangeline Dakin, DNP  [facility] [address], [place], [place] [address] [phone]

## 2020-08-03 NOTE — Progress Notes (Signed)
I have read the note, and I agree with the clinical assessment and plan.  Malorie Bigford K Jeanann Balinski   

## 2020-08-04 ENCOUNTER — Encounter: Payer: Self-pay | Admitting: Internal Medicine

## 2020-08-10 ENCOUNTER — Encounter: Payer: Self-pay | Admitting: Allergy & Immunology

## 2020-08-10 ENCOUNTER — Ambulatory Visit (INDEPENDENT_AMBULATORY_CARE_PROVIDER_SITE_OTHER): Payer: Medicare Other | Admitting: Allergy & Immunology

## 2020-08-10 ENCOUNTER — Other Ambulatory Visit: Payer: Self-pay

## 2020-08-10 VITALS — BP 130/74 | HR 60 | Temp 98.2°F | Resp 16 | Ht 61.0 in | Wt 137.2 lb

## 2020-08-10 DIAGNOSIS — J302 Other seasonal allergic rhinitis: Secondary | ICD-10-CM | POA: Diagnosis not present

## 2020-08-10 DIAGNOSIS — J4540 Moderate persistent asthma, uncomplicated: Secondary | ICD-10-CM | POA: Diagnosis not present

## 2020-08-10 MED ORDER — BREO ELLIPTA 100-25 MCG/INH IN AEPB: 1.0000 | INHALATION_SPRAY | Freq: Every evening | RESPIRATORY_TRACT | 1 refills | Status: DC

## 2020-08-10 MED ORDER — FLUTICASONE PROPIONATE 50 MCG/ACT NA SUSP: 2.0000 | Freq: Every day | NASAL | 5 refills | Status: DC

## 2020-08-10 MED ORDER — AZELASTINE HCL 0.1 % NA SOLN: 1.0000 | Freq: Two times a day (BID) | NASAL | 5 refills | Status: DC

## 2020-08-10 MED ORDER — ALBUTEROL SULFATE HFA 108 (90 BASE) MCG/ACT IN AERS: 2.0000 | INHALATION_SPRAY | Freq: Four times a day (QID) | RESPIRATORY_TRACT | 1 refills | Status: DC | PRN

## 2020-08-10 NOTE — Patient Instructions (Addendum)
1. Mild persistent asthma, uncomplicated - Lung testing looked AWESOME today.  - Keep up the good work.  - Continue with Breo 100/25 one puff once daily.  - Continue with albuterol 2-4 puffs every 4-6 hours as needed.   2. Allergic rhinoconjunctivitis - Continue with fluticasone and azelastine: do one spray per nostril twice daily - Continue with Claritin (lortadine), but CHANGE TO AS NEEDED.   2. Return in about 6 months (around 02/09/2021).   Please inform us of any Emergency Department visits, hospitalizations, or changes in symptoms. Call us before going to the ED for breathing or allergy symptoms since we might be able to fit you in for a sick visit. Feel free to contact us anytime with any questions, problems, or concerns.  It was a pleasure to see you again today! I LOVE SEEING YOU!!!   Websites that have reliable patient information: 1. American Academy of Asthma, Allergy, and Immunology: www.aaaai.org 2. Food Allergy Research and Education (FARE): foodallergy.org 3. Mothers of Asthmatics: http://www.asthmacommunitynetwork.org 4. American College of Allergy, Asthma, and Immunology: www.acaai.org   COVID-19 Vaccine Information can be found at: ShippingScam.co.uk For questions related to vaccine distribution or appointments, please email vaccine@[email or web address] .[email or web address] or call [phone].     "Like" [place] on Facebook and Instagram for our latest updates!        Make sure you are registered to vote! If you have moved or changed any of your contact information, you will need to get this updated before voting!  In some cases, you MAY be able to register to vote online: [email or web address]

## 2020-08-10 NOTE — Progress Notes (Signed)
FOLLOW UP  Date of Service/Encounter:  08/10/20   Assessment:   Moderate persistent asthma without complication   Seasonal allergic rhinitis  TIA(summer 2019)  Essential tremor with recent fall (October 2021) - now using a walker    Plan/Recommendations:   1. Mild persistent asthma, uncomplicated - Lung testing looked AWESOME today.  - Keep up the good work.  - Continue with Breo 100/25 one puff once daily.  - Continue with albuterol 2-4 puffs every 4-6 hours as needed.   2. Allergic rhinoconjunctivitis - Continue with fluticasone and azelastine: do one spray per nostril twice daily - Continue with Claritin (lortadine), but CHANGE TO AS NEEDED.   3. Return in about 6 months (around 02/09/2021).   Subjective:   Christina Lawson is a 87 y.o. female presenting today for follow up of  Chief Complaint  Patient presents with  . Asthma  . Cough    Has had a cough for over a month     Christina Lawson has a history of the following: Patient Active Problem List   Diagnosis Date Noted  . Syncope and collapse 12/10/2019  . AF (paroxysmal atrial fibrillation) ([place]) 12/10/2019  . Mild cognitive impairment with memory loss 12/03/2019  . Mixed incontinence urge and stress 07/30/2019  . Psychophysiological insomnia 03/26/2019  . Paroxysmal atrial fibrillation ([place]) 08/21/2018  . NSTEMI (non-ST elevated myocardial infarction) ([place]) 05/15/2018  . Chest pain   . Atrial fibrillation with RVR ([place]) 05/13/2018  . Acquired hypothyroidism 05/13/2018  . Left thigh pain 10/10/2017  . Anxiety 10/10/2017  . H/O TIA (transient ischemic attack) and stroke 10/10/2017  . Moderate persistent asthma without complication 19/50/9326  . Parkinsonism ([place]) 04/11/2017  . Mild persistent asthma without complication 71/24/5809  . Seasonal allergic rhinitis 04/05/2017  . Carpal tunnel syndrome, bilateral 07/03/2016  . Numbness and tingling in both hands 06/05/2016  . Drooling  04/12/2016  . Cornea guttata 06/03/2015  . Posterior vitreous detachment of both eyes 06/03/2015  . Hyperglycemia 03/24/2015  . Senile osteopenia 03/24/2015  . 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 Murmur, cardiac 03/24/2015  . Conjunctivitis 01/12/2014  . Edema 08/20/2013  . Senile ecchymosis 06/23/2013  . Rash and nonspecific skin eruption 03/26/2013  . Pain in left knee 01/06/2013  . Osteopenia 12/16/2012  . Urinary frequency 09/23/2012  . Impacted cerumen 09/23/2012  . Essential hypertension   . Hyperlipidemia   . Tremor, essential   . Nonexudative age-related macular degeneration 05/23/2012  . Status post intraocular lens implant 05/23/2012  . Cardiomegaly - hypertensive 08/10/2010  . Asthma, intrinsic 05/31/2010  . Dyspnea 05/31/2010    History obtained from: chart review and patient.  Christina Lawson is a 87 y.o. female presenting for a follow up visit.  Since the last visit, she has done well. She started using a walker since the last visit. She had a fall that necessitated adding the walker. She was in the hospital and then Rehab. Nothing was broken at all. She fell on her head and she had stitches in her scalp. She was in Rehab for one week only and did well.   Asthma/Respiratory Symptom History: Breathing is good unless she rushes too much. She remains on the Breo one puff once daily. She is back paying $30 or so per month.  Christina Lawson's asthma has been well controlled. She has not required rescue medication, experienced nocturnal awakenings due to lower respiratory symptoms, nor have activities of daily living been limited. She has required no Emergency Department or Urgent Care  visits for her asthma. She has required zero courses of systemic steroids for asthma exacerbations since the last visit. ACT score today is 23, indicating excellent asthma symptom control.   Allergic Rhinitis Symptom History: She never stops the Claritin. She does well with the nose sprays.  She is open  to stopping some medications to see if she can simplify her regimen.  She has not required any antibiotics for sinus infections.  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  She is taking this in stride and is very upbeat about everything in general.  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  She is an avid newspaper reader.  Otherwise, there have been no changes to her past medical history, surgical history, family history, or social history.    Review of Systems  Constitutional: Negative.  Negative for chills, fever, malaise/fatigue and weight loss.  HENT: Negative for congestion, ear discharge, ear pain and sinus pain.   Eyes: Negative for pain, discharge and redness.  Respiratory: Negative for cough, sputum production, shortness of breath and wheezing.   Cardiovascular: Negative.  Negative for chest pain and palpitations.  Gastrointestinal: Negative for abdominal pain, constipation, diarrhea, heartburn, nausea and vomiting.  Skin: Negative.  Negative for itching and rash.  Neurological: Negative for dizziness and headaches.  Endo/Heme/Allergies: Positive for environmental allergies. Does not bruise/bleed easily.       Objective:   Blood pressure 130/74, pulse 60, temperature 98.2 F (36.8 C), resp. rate 16, height 5\' 1"  (1.549 m), weight 137 lb 3.2 oz (62.2 kg), SpO2 94 %. Body mass index is 25.92 kg/m.   Physical Exam:  Physical Exam Constitutional:      Appearance: She is well-developed.     Comments: Using a walker, but she gets along very fast with it.  HENT:     Head: Normocephalic and atraumatic.     Right Ear: Tympanic membrane, ear canal and external ear normal.     Left Ear: Tympanic membrane, ear canal and external ear normal.     Nose: No nasal deformity, septal deviation, mucosal edema or rhinorrhea.     Right Turbinates: Enlarged and swollen.     Left Turbinates:  Enlarged and swollen.     Right Sinus: No maxillary sinus tenderness or frontal sinus tenderness.     Left Sinus: No maxillary sinus tenderness or frontal sinus tenderness.     Mouth/Throat:     Mouth: Mucous membranes are not pale and not dry.     Pharynx: Uvula midline.  Eyes:     General:        Right eye: No discharge.        Left eye: No discharge.     Conjunctiva/sclera: Conjunctivae normal.     Right eye: Right conjunctiva is not injected. No chemosis.    Left eye: Left conjunctiva is not injected. No chemosis.    Pupils: Pupils are equal, round, and reactive to light.  Cardiovascular:     Rate and Rhythm: Normal rate and regular rhythm.     Heart sounds: Normal heart sounds.  Pulmonary:     Effort: Pulmonary effort is normal. No tachypnea, accessory muscle usage or respiratory distress.     Breath sounds: Normal breath sounds. No wheezing, rhonchi or rales.     Comments: Moving air well in all lung fields.  No increased work of breathing. Chest:  Chest wall: No tenderness.  Lymphadenopathy:     Cervical: No cervical adenopathy.  Skin:    General: Skin is warm.     Capillary Refill: Capillary refill takes less than 2 seconds.     Coloration: Skin is not pale.     Findings: No abrasion, erythema, petechiae or rash. Rash is not papular, urticarial or vesicular.     Comments: No eczematous or urticarial lesions noted.  Neurological:     Mental Status: She is alert.  Psychiatric:        Behavior: Behavior is cooperative.      Diagnostic studies:   Spirometry: results normal (FEV1: 1.21/106%, FVC: 1.39/87%, FEV1/FVC: 87%).    Spirometry consistent with normal pattern.   Allergy Studies: none        Salvatore Marvel, MD  [facility]

## 2020-09-06 ENCOUNTER — Encounter: Payer: Self-pay | Admitting: Adult Health

## 2020-09-06 ENCOUNTER — Other Ambulatory Visit: Payer: Self-pay

## 2020-09-06 ENCOUNTER — Non-Acute Institutional Stay: Payer: Medicare Other | Admitting: Adult Health

## 2020-09-06 VITALS — BP 146/78 | HR 54 | Temp 96.3°F | Ht 61.0 in | Wt 138.8 lb

## 2020-09-06 DIAGNOSIS — G5602 Carpal tunnel syndrome, left upper limb: Secondary | ICD-10-CM | POA: Diagnosis not present

## 2020-09-06 NOTE — Progress Notes (Signed)
[facility]  POS: clinic  Provider:  Cindi Carbon, [place] [phone]   Code Status: DNR Goals of Care:  Advanced Directives 07/21/2020  Does Patient Have a Medical Advance Directive? Yes  Type of Advance Directive Out of facility DNR (pink MOST or yellow form);Living will  Does patient want to make changes to medical advance directive? No - Patient declined  Copy of [place] in Chart? -  Pre-existing out of facility DNR order (yellow form or pink MOST form) Yellow form placed in chart (order not valid for inpatient use);Pink MOST form placed in chart (order not valid for inpatient use)     Chief Complaint  Patient presents with   Acute Visit    Left hand numbness     HPI: Patient is a 87 y.o. female seen today for an acute visit for left hand numbness. Symptoms first noticed 2 weeks ago per pt. Symptoms are present mostly to the 3rd digit, sometimes the 2nd but not the 3rd or 4th digit on the left hand. No elevated bp, speech problems, one sided weakness, visual changes, or headaches.  PMH significant for CVA/TIA and afib. Currently on eliquis and metoprolol for rate control.  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 Now she wears gloves. She remains ambulatory with a walker and lives in assisted living. 89 and a DNR with some memory issues. Difficult to obtain a hx but she feels this has been going on two weeks and is similar to other times she has had CTS.  At first she said she had a headache but then later said she did not.     Past Medical History:  Diagnosis Date   Allergic rhinitis    pollen and grass   Asthma    Atrial fibrillation ([place]) 05/13/2018   Atrial fibrillation ([place])    Blepharitis    Carpal tunnel syndrome, bilateral 07/03/2016   Family history of adverse reaction to anesthesia    " [place] (87YR OLD ) DIED UNDER  ANESTHESTIA    High cholesterol    HOH (hard of hearing)    Hyperlipidemia    Irregular heart rhythm    Macular degeneration 2012   Osteoporosis    Sensorineural hearing loss, bilateral 2007   Shingles 2004   Skin cancer 2005   nose   Syncope and collapse 12/11/2019   TIA (transient ischemic attack)    Torn meniscus 2006   right knee   Tremor 2006   familiar.  dx by Dr. Quillian Quince   Unspecified essential hypertension    Unspecified hypothyroidism    Urinary frequency     Past Surgical History:  Procedure Laterality Date   ABDOMINAL HYSTERECTOMY  1977   ABDOMINAL HYSTERECTOMY     CATARACT EXTRACTION  1974   Left, IOL implants   CATARACT EXTRACTION Right 1979   IOL implants   [place]   eye   [place]   SKIN CANCER EXCISION  2005   nose   THYROIDECTOMY  1955   TONSILLECTOMY  1931   TONSILLECTOMY      Allergies  Allergen Reactions  Clindamycin/Lincomycin Diarrhea    diarrhea   Clindamycin/Lincomycin     Listed on MAR -- reaction unknown   Grass Pollen(K-O-R-T-Swt Vern)     Listed on MAR -- reaction unknown   Ibuprofen Other (See Comments)    Long time ago/ list on paper work   Ibuprofen     Listed on MAR -- reaction unknown   Other     Preparation H Listed on MAR -- reaction unknown   Penicillins Other (See Comments)    Did it involve swelling of the face/tongue/throat, SOB, or low BP? Unknown Did it involve sudden or severe rash/hives, skin peeling, or any reaction on the inside of your mouth or nose? Unknown Did you need to seek medical attention at a hospital or doctor's office? Unknown When did it last happen?       If all above answers are "NO", may proceed with cephalosporin use.   Penicillins     Listed on MAR -- reaction unknown    Outpatient Encounter  Medications as of 09/06/2020  Medication Sig   acetaminophen (TYLENOL) 325 MG tablet Take 650 mg by mouth as needed.   albuterol (VENTOLIN HFA) 108 (90 Base) MCG/ACT inhaler Inhale 2 puffs into the lungs every 6 (six) hours as needed for wheezing.   amLODipine (NORVASC) 5 MG tablet Take 1 tablet (5 mg total) by mouth daily.   apixaban (ELIQUIS) 2.5 MG TABS tablet Take 2.5 mg by mouth 2 (two) times daily.   azelastine (ASTELIN) 0.1 % nasal spray Place 1 spray into both nostrils 2 (two) times daily. Use in each nostril as directed   BREO ELLIPTA 100-25 MCG/INH AEPB Inhale 1 puff into the lungs every evening.   calcium carbonate (TUMS - DOSED IN MG ELEMENTAL CALCIUM) 500 MG chewable tablet Chew 1 tablet by mouth daily as needed for indigestion.   Cholecalciferol (VITAMIN D) 2000 units CAPS Take 1 capsule (2,000 Units total) by mouth daily.   diclofenac Sodium (VOLTAREN) 1 % GEL Apply topically as needed. MAR states: Dose: "Small amount", Frequency: "As needed", Instructions: "May keep at bedside and self administer"   fluticasone (FLONASE) 50 MCG/ACT nasal spray Place 2 sprays into both nostrils daily.   HYDROcodone-acetaminophen (NORCO/VICODIN) 5-325 MG tablet Take 0.5 tablets by mouth 2 (two) times daily as needed for severe pain.   loratadine (CLARITIN) 10 MG tablet Take 1 tablet (10 mg total) by mouth daily.   metoprolol succinate (TOPROL-XL) 25 MG 24 hr tablet Take 12.5 mg by mouth daily.   Multiple Vitamins-Minerals (PRESERVISION AREDS 2+MULTI VIT) CAPS Take 1 capsule by mouth 2 (two) times daily.   oxymetazoline (AFRIN) 0.05 % nasal spray Place 2 sprays into both nostrils 2 (two) times daily as needed for congestion.    Polyethyl Glycol-Propyl Glycol (SYSTANE) 0.4-0.3 % SOLN Apply to eye.   pyridostigmine (MESTINON) 60 MG tablet Take 0.5 tablets (30 mg total) by mouth daily.   senna-docusate (SENOKOT S) 8.6-50 MG tablet Take 2 tablets by mouth daily.   sertraline (ZOLOFT) 50 MG tablet Take 50  mg by mouth daily.   No facility-administered encounter medications on file as of 09/06/2020.    Review of Systems:  Review of Systems  Constitutional:  Negative for chills, diaphoresis and fever.  Eyes:  Negative for photophobia, pain, redness and visual disturbance.  Respiratory:  Negative for cough and wheezing.   Cardiovascular:  Negative for chest pain and leg swelling.  Gastrointestinal:  Negative for abdominal pain, constipation, diarrhea, nausea and vomiting.  Genitourinary:  Negative for dysuria and urgency.  Musculoskeletal:  Negative for back pain, myalgias and neck pain.  Skin:  Negative for rash.  Neurological:  Positive for tremors and numbness (left third digit). Negative for dizziness, seizures, syncope, facial asymmetry, speech difficulty, weakness, light-headedness and headaches.  Psychiatric/Behavioral:  Negative for agitation and confusion.    Health Maintenance  Topic Date Due   COVID-19 Vaccine (4 - Booster for Moderna series) 05/23/2020   INFLUENZA VACCINE  10/11/2020   TETANUS/TDAP  04/07/2025   DEXA SCAN  Completed   PNA vac Low Risk Adult  Completed   Zoster Vaccines- Shingrix  Completed   HPV VACCINES  Aged Out    Physical Exam: Vitals:   09/06/20 1302  BP: (!) 146/78  Pulse: (!) 54  Temp: (!) 96.3 F (35.7 C)  SpO2: 96%  Weight: 138 lb 12.8 oz (63 kg)  Height: 5\' 1"  (1.549 m)   Body mass index is 26.23 kg/m. Physical Exam Vitals and nursing note reviewed.  Constitutional:      General: She is not in acute distress.    Appearance: She is not diaphoretic.  HENT:     Head: Normocephalic and atraumatic.  Neck:     Vascular: No JVD.  Cardiovascular:     Rate and Rhythm: Normal rate. Rhythm irregular.     Heart sounds: No murmur heard. Pulmonary:     Effort: Pulmonary effort is normal. No respiratory distress.     Breath sounds: Normal breath sounds. No wheezing.  Musculoskeletal:     Right wrist: Normal.     Left wrist: Normal. No  swelling, deformity, effusion, lacerations, tenderness, bony tenderness, snuff box tenderness or crepitus. Normal range of motion. Normal pulse.     Comments: Symptoms present on exam to to the 3rd digit. Not able to do Phalens test  Skin:    General: Skin is warm and dry.  Neurological:     General: No focal deficit present.     Mental Status: She is alert and oriented to person, place, and time. Mental status is at baseline.     Cranial Nerves: No cranial nerve deficit.     Motor: No weakness.    Labs reviewed: Basic Metabolic Panel: Recent Labs    12/10/19 1203 12/10/19 1900 12/11/19 0445 07/22/20 0000 07/22/20 1300 08/02/20 0000  NA 139  --  136  --  140 141  K 3.9  --  3.7  --  4.9 4.6  CL 105  --  102  --  104 103  CO2 25  --  24  --  27* 24*  GLUCOSE 123*  --  108*  --   --   --   BUN 18  --  12  --  16 15  CREATININE 0.86   < > 0.72  --  0.8 0.8  CALCIUM 9.4  --  9.1 9.4  --  9.8  TSH 3.945  --   --   --  3.50  --    < > = values in this interval not displayed.   Liver Function Tests: Recent Labs    12/10/19 1203 12/11/19 0445 07/22/20 0000 07/22/20 1300 08/02/20 0000  AST 23 27  --  17 17  ALT 16 19  --  12 12  ALKPHOS 47 54  --  71 67  BILITOT 0.9 1.3*  --   --   --   PROT 6.7 6.5  --   --   --  ALBUMIN 3.5 3.3* 3.9  --  4.2   No results for input(s): LIPASE, AMYLASE in the last 8760 hours. No results for input(s): AMMONIA in the last 8760 hours. CBC: Recent Labs    12/10/19 1900 12/11/19 0445 07/22/20 0000 08/02/20 0000  WBC 9.7 8.5 5.0 5.6  NEUTROABS 7.6  --  3.00  --   HGB 13.1 13.3 12.7 13.2  HCT 39.8 39.8 37 38  MCV 95.2 93.0  --   --   PLT 229 211 215 250   Lipid Panel: No results for input(s): CHOL, HDL, LDLCALC, TRIG, CHOLHDL, LDLDIRECT in the last 8760 hours. Lab Results  Component Value Date   HGBA1C 5.9 10/02/2017    Procedures since last visit: No results found.  Assessment/Plan  1. Carpal tunnel syndrome of left  wrist Pt has symptoms mostly in the 3rd digit of the left hand not in the 4th or 5th digit. No other associated symptoms to indicate a CVA Recommend reduction of repetitive movements such as typing Needs new wrist brace OT eval   RTC if not improving, currently on anticoagulation and not a candidate for nsaids   Labs/tests ordered:  * No order type specified * Next appt:  Sept with Cindi Carbon NP for routine f/u  Total time *32min:  time greater than 50% of total time spent doing pt counseling and coordination of care

## 2020-09-14 DIAGNOSIS — S90212A Contusion of left great toe with damage to nail, initial encounter: Secondary | ICD-10-CM | POA: Diagnosis not present

## 2020-09-14 DIAGNOSIS — B351 Tinea unguium: Secondary | ICD-10-CM | POA: Diagnosis not present

## 2020-09-20 DIAGNOSIS — R278 Other lack of coordination: Secondary | ICD-10-CM | POA: Diagnosis not present

## 2020-09-20 DIAGNOSIS — M6389 Disorders of muscle in diseases classified elsewhere, multiple sites: Secondary | ICD-10-CM | POA: Diagnosis not present

## 2020-09-20 DIAGNOSIS — G5603 Carpal tunnel syndrome, bilateral upper limbs: Secondary | ICD-10-CM | POA: Diagnosis not present

## 2020-09-20 DIAGNOSIS — R293 Abnormal posture: Secondary | ICD-10-CM | POA: Diagnosis not present

## 2020-09-21 DIAGNOSIS — R278 Other lack of coordination: Secondary | ICD-10-CM | POA: Diagnosis not present

## 2020-09-21 DIAGNOSIS — M6389 Disorders of muscle in diseases classified elsewhere, multiple sites: Secondary | ICD-10-CM | POA: Diagnosis not present

## 2020-09-21 DIAGNOSIS — G5603 Carpal tunnel syndrome, bilateral upper limbs: Secondary | ICD-10-CM | POA: Diagnosis not present

## 2020-09-21 DIAGNOSIS — R293 Abnormal posture: Secondary | ICD-10-CM | POA: Diagnosis not present

## 2020-09-23 DIAGNOSIS — G5603 Carpal tunnel syndrome, bilateral upper limbs: Secondary | ICD-10-CM | POA: Diagnosis not present

## 2020-09-23 DIAGNOSIS — R278 Other lack of coordination: Secondary | ICD-10-CM | POA: Diagnosis not present

## 2020-09-23 DIAGNOSIS — M6389 Disorders of muscle in diseases classified elsewhere, multiple sites: Secondary | ICD-10-CM | POA: Diagnosis not present

## 2020-09-23 DIAGNOSIS — R293 Abnormal posture: Secondary | ICD-10-CM | POA: Diagnosis not present

## 2020-09-27 DIAGNOSIS — R293 Abnormal posture: Secondary | ICD-10-CM | POA: Diagnosis not present

## 2020-09-27 DIAGNOSIS — M6389 Disorders of muscle in diseases classified elsewhere, multiple sites: Secondary | ICD-10-CM | POA: Diagnosis not present

## 2020-09-27 DIAGNOSIS — R278 Other lack of coordination: Secondary | ICD-10-CM | POA: Diagnosis not present

## 2020-09-27 DIAGNOSIS — G5603 Carpal tunnel syndrome, bilateral upper limbs: Secondary | ICD-10-CM | POA: Diagnosis not present

## 2020-09-28 DIAGNOSIS — M6389 Disorders of muscle in diseases classified elsewhere, multiple sites: Secondary | ICD-10-CM | POA: Diagnosis not present

## 2020-09-28 DIAGNOSIS — R278 Other lack of coordination: Secondary | ICD-10-CM | POA: Diagnosis not present

## 2020-09-28 DIAGNOSIS — G5603 Carpal tunnel syndrome, bilateral upper limbs: Secondary | ICD-10-CM | POA: Diagnosis not present

## 2020-09-28 DIAGNOSIS — R293 Abnormal posture: Secondary | ICD-10-CM | POA: Diagnosis not present

## 2020-09-29 DIAGNOSIS — R293 Abnormal posture: Secondary | ICD-10-CM | POA: Diagnosis not present

## 2020-09-29 DIAGNOSIS — R278 Other lack of coordination: Secondary | ICD-10-CM | POA: Diagnosis not present

## 2020-09-29 DIAGNOSIS — M6389 Disorders of muscle in diseases classified elsewhere, multiple sites: Secondary | ICD-10-CM | POA: Diagnosis not present

## 2020-09-29 DIAGNOSIS — G5603 Carpal tunnel syndrome, bilateral upper limbs: Secondary | ICD-10-CM | POA: Diagnosis not present

## 2020-09-30 DIAGNOSIS — R278 Other lack of coordination: Secondary | ICD-10-CM | POA: Diagnosis not present

## 2020-09-30 DIAGNOSIS — G5603 Carpal tunnel syndrome, bilateral upper limbs: Secondary | ICD-10-CM | POA: Diagnosis not present

## 2020-09-30 DIAGNOSIS — R293 Abnormal posture: Secondary | ICD-10-CM | POA: Diagnosis not present

## 2020-09-30 DIAGNOSIS — M6389 Disorders of muscle in diseases classified elsewhere, multiple sites: Secondary | ICD-10-CM | POA: Diagnosis not present

## 2020-10-01 DIAGNOSIS — R293 Abnormal posture: Secondary | ICD-10-CM | POA: Diagnosis not present

## 2020-10-01 DIAGNOSIS — M6389 Disorders of muscle in diseases classified elsewhere, multiple sites: Secondary | ICD-10-CM | POA: Diagnosis not present

## 2020-10-01 DIAGNOSIS — R278 Other lack of coordination: Secondary | ICD-10-CM | POA: Diagnosis not present

## 2020-10-01 DIAGNOSIS — G5603 Carpal tunnel syndrome, bilateral upper limbs: Secondary | ICD-10-CM | POA: Diagnosis not present

## 2020-10-04 DIAGNOSIS — M6389 Disorders of muscle in diseases classified elsewhere, multiple sites: Secondary | ICD-10-CM | POA: Diagnosis not present

## 2020-10-04 DIAGNOSIS — R293 Abnormal posture: Secondary | ICD-10-CM | POA: Diagnosis not present

## 2020-10-04 DIAGNOSIS — R278 Other lack of coordination: Secondary | ICD-10-CM | POA: Diagnosis not present

## 2020-10-04 DIAGNOSIS — G5603 Carpal tunnel syndrome, bilateral upper limbs: Secondary | ICD-10-CM | POA: Diagnosis not present

## 2020-10-05 DIAGNOSIS — R278 Other lack of coordination: Secondary | ICD-10-CM | POA: Diagnosis not present

## 2020-10-05 DIAGNOSIS — M6389 Disorders of muscle in diseases classified elsewhere, multiple sites: Secondary | ICD-10-CM | POA: Diagnosis not present

## 2020-10-05 DIAGNOSIS — G5603 Carpal tunnel syndrome, bilateral upper limbs: Secondary | ICD-10-CM | POA: Diagnosis not present

## 2020-10-05 DIAGNOSIS — R293 Abnormal posture: Secondary | ICD-10-CM | POA: Diagnosis not present

## 2020-10-06 DIAGNOSIS — G5603 Carpal tunnel syndrome, bilateral upper limbs: Secondary | ICD-10-CM | POA: Diagnosis not present

## 2020-10-06 DIAGNOSIS — M6389 Disorders of muscle in diseases classified elsewhere, multiple sites: Secondary | ICD-10-CM | POA: Diagnosis not present

## 2020-10-06 DIAGNOSIS — R293 Abnormal posture: Secondary | ICD-10-CM | POA: Diagnosis not present

## 2020-10-06 DIAGNOSIS — R278 Other lack of coordination: Secondary | ICD-10-CM | POA: Diagnosis not present

## 2020-10-07 DIAGNOSIS — Z23 Encounter for immunization: Secondary | ICD-10-CM | POA: Diagnosis not present

## 2020-10-07 DIAGNOSIS — R293 Abnormal posture: Secondary | ICD-10-CM | POA: Diagnosis not present

## 2020-10-07 DIAGNOSIS — G5603 Carpal tunnel syndrome, bilateral upper limbs: Secondary | ICD-10-CM | POA: Diagnosis not present

## 2020-10-07 DIAGNOSIS — M6389 Disorders of muscle in diseases classified elsewhere, multiple sites: Secondary | ICD-10-CM | POA: Diagnosis not present

## 2020-10-07 DIAGNOSIS — R278 Other lack of coordination: Secondary | ICD-10-CM | POA: Diagnosis not present

## 2020-10-11 ENCOUNTER — Non-Acute Institutional Stay: Payer: Medicare Other | Admitting: Internal Medicine

## 2020-10-11 ENCOUNTER — Encounter: Payer: Self-pay | Admitting: Internal Medicine

## 2020-10-11 DIAGNOSIS — R278 Other lack of coordination: Secondary | ICD-10-CM | POA: Diagnosis not present

## 2020-10-11 DIAGNOSIS — R21 Rash and other nonspecific skin eruption: Secondary | ICD-10-CM | POA: Diagnosis not present

## 2020-10-11 DIAGNOSIS — G5603 Carpal tunnel syndrome, bilateral upper limbs: Secondary | ICD-10-CM | POA: Diagnosis not present

## 2020-10-11 DIAGNOSIS — R293 Abnormal posture: Secondary | ICD-10-CM | POA: Diagnosis not present

## 2020-10-11 DIAGNOSIS — M6389 Disorders of muscle in diseases classified elsewhere, multiple sites: Secondary | ICD-10-CM | POA: Diagnosis not present

## 2020-10-11 NOTE — Progress Notes (Signed)
Location:   [place] Room Number: [id number] Place of Service:  ALF [phone]) Provider:  Veleta Miners MD  Virgie Dad, MD  Patient Care Team: Virgie Dad, MD as PCP - General (Internal Medicine) Elouise Munroe, MD as PCP - Cardiology (Cardiology) [facility], Armando Reichert, MD as Consulting Physician (Pulmonary Disease) Verl Blalock, Marijo Conception, MD (Inactive) as Consulting Physician (Cardiology) Latanya Maudlin, MD as Consulting Physician (Orthopedic Surgery) Kathrynn Ducking, MD as Consulting Physician (Neurology) [place], Jennefer Bravo, MD as Referring Physician (Dermatology) Gean Quint, MD (Inactive) as Consulting Physician (Allergy and Immunology) Shon Hough, MD as Consulting Physician (Ophthalmology) Paula Compton, MD as Consulting Physician (Obstetrics and Gynecology) Vicie Mutters, MD as Consulting Physician (Otolaryngology) Raeanne Gathers, AUD (Audiology) Carmel Sacramento, CCC-A (Audiology) Gayland Curry, DO (Geriatric Medicine) Elouise Munroe, MD (Cardiology)  Extended Emergency Contact Information Primary Emergency Contact: Cherlyn Labella Home Phone: [phone] Relation: Daughter Secondary Emergency Contact: Rossi,Lynn Address: [place] [address] Phone: [phone] Mobile Phone: [phone] Relation: Daughter  Code Status DNR   Goals of care: Advanced Directive information Advanced Directives 10/11/2020  Does Patient Have a Medical Advance Directive? Yes  Type of Advance Directive Living will;Out of facility DNR (pink MOST or yellow form)  Does patient want to make changes to medical advance directive? No - Patient declined  Copy of [place] in Chart? -  Pre-existing out of facility DNR order (yellow form or pink MOST form) Yellow form placed in chart (order not valid for inpatient use)     Chief Complaint   Patient presents with   Acute Visit    Rash in LE    HPI:  Pt is a 87 y.o. female seen today for an acute visit for rash in left lower extremity  Patient has a history of PAF on Eliquis History of TIA and stroke Essential hypertension, hyperlipidemia History of autoimmune orthostatic hypotension causing syncope Chronic constipation, history of urinary incontinence has failed Myrbetriq History of essential tremors per neurology  Seen to day in her Room for Left Leg Rash with itching Had noticed few days ago. No new Lotions or meds Dryness in other leg but no Rash Not tender No Fever Past Medical History:  Diagnosis Date   Allergic rhinitis    pollen and grass   Asthma    Atrial fibrillation ([place]) 05/13/2018   Atrial fibrillation ([place])    Blepharitis    Carpal tunnel syndrome, bilateral 07/03/2016   Family history of adverse reaction to anesthesia    " [place] (87YR OLD ) DIED UNDER ANESTHESTIA    High cholesterol    HOH (hard of hearing)    Hyperlipidemia    Irregular heart rhythm    Macular degeneration 2012   Osteoporosis    Sensorineural hearing loss, bilateral 2007   Shingles 2004   Skin cancer 2005   nose   Syncope and collapse 12/11/2019   TIA (transient ischemic attack)    Torn meniscus 2006   right knee   Tremor 2006   familiar.  dx by Dr. Quillian Quince   Unspecified essential hypertension    Unspecified hypothyroidism    Urinary frequency    Past Surgical History:  Procedure Laterality Date   ABDOMINAL HYSTERECTOMY  1977   ABDOMINAL HYSTERECTOMY     CATARACT EXTRACTION  1974   Left, IOL implants  CATARACT EXTRACTION Right 1979   IOL implants   [place]   eye   CYSTOCELE REPAIR  1993   DILATION AND CURETTAGE OF UTERUS  1977   EYE SURGERY     GALLBLADDER [place]   SKIN CANCER EXCISION  2005   nose   THYROIDECTOMY  1955   TONSILLECTOMY   1931   TONSILLECTOMY      Allergies  Allergen Reactions   Clindamycin/Lincomycin Diarrhea    diarrhea   Clindamycin/Lincomycin     Listed on MAR -- reaction unknown   Grass Pollen(K-O-R-T-Swt Vern)     Listed on MAR -- reaction unknown   Ibuprofen Other (See Comments)    Long time ago/ list on paper work   Ibuprofen     Listed on MAR -- reaction unknown   Other     Preparation H Listed on MAR -- reaction unknown   Penicillins Other (See Comments)    Did it involve swelling of the face/tongue/throat, SOB, or low BP? Unknown Did it involve sudden or severe rash/hives, skin peeling, or any reaction on the inside of your mouth or nose? Unknown Did you need to seek medical attention at a hospital or doctor's office? Unknown When did it last happen?       If all above answers are "NO", may proceed with cephalosporin use.   Penicillins     Listed on MAR -- reaction unknown    Allergies as of 10/11/2020       Reactions   Clindamycin/lincomycin Diarrhea   diarrhea   Clindamycin/lincomycin    Listed on MAR -- reaction unknown   Grass Pollen(k-o-r-t-swt Vern)    Listed on MAR -- reaction unknown   Ibuprofen Other (See Comments)   Long time ago/ list on paper work   Ibuprofen    Listed on MAR -- reaction unknown   Other    Preparation H Listed on MAR -- reaction unknown   Penicillins Other (See Comments)   Did it involve swelling of the face/tongue/throat, SOB, or low BP? Unknown Did it involve sudden or severe rash/hives, skin peeling, or any reaction on the inside of your mouth or nose? Unknown Did you need to seek medical attention at a hospital or doctor's office? Unknown When did it last happen?       If all above answers are "NO", may proceed with cephalosporin use.   Penicillins    Listed on MAR -- reaction unknown        Medication List        Accurate as of October 11, 2020  3:26 PM. If you have any questions, ask your nurse or doctor.           acetaminophen 325 MG tablet Commonly known as: TYLENOL Take 650 mg by mouth as needed.   albuterol 108 (90 Base) MCG/ACT inhaler Commonly known as: VENTOLIN HFA Inhale 2 puffs into the lungs every 6 (six) hours as needed for wheezing.   amLODipine 5 MG tablet Commonly known as: NORVASC Take 1 tablet (5 mg total) by mouth daily.   apixaban 2.5 MG Tabs tablet Commonly known as: ELIQUIS Take 2.5 mg by mouth 2 (two) times daily.   azelastine 0.1 % nasal spray Commonly known as: ASTELIN Place 1 spray into both nostrils 2 (two) times daily. Use in each nostril as directed   Breo Ellipta 100-25  MCG/INH Aepb Generic drug: fluticasone furoate-vilanterol Inhale 1 puff into the lungs every evening.   calcium carbonate 500 MG chewable tablet Commonly known as: TUMS - dosed in mg elemental calcium Chew 1 tablet by mouth daily as needed for indigestion.   clotrimazole 1 % cream Commonly known as: LOTRIMIN Apply 1 application topically 2 (two) times daily.   diclofenac Sodium 1 % Gel Commonly known as: VOLTAREN Apply topically as needed. MAR states: Dose: "Small amount", Frequency: "As needed", Instructions: "May keep at bedside and self administer"   fluticasone 50 MCG/ACT nasal spray Commonly known as: FLONASE Place 1 spray into both nostrils 2 (two) times daily. What changed: Another medication with the same name was removed. Continue taking this medication, and follow the directions you see here. Changed by: Virgie Dad, MD   HYDROcodone-acetaminophen 5-325 MG tablet Commonly known as: NORCO/VICODIN Take 0.5 tablets by mouth 2 (two) times daily as needed for severe pain.   loratadine 10 MG tablet Commonly known as: CLARITIN Take 1 tablet (10 mg total) by mouth daily.   metoprolol succinate 25 MG 24 hr tablet Commonly known as: TOPROL-XL Take 12.5 mg by mouth daily.   oxymetazoline 0.05 % nasal spray Commonly known as: AFRIN Place 2 sprays into both nostrils 2 (two)  times daily as needed for congestion.   PreserVision AREDS 2+Multi Vit Caps Take 1 capsule by mouth 2 (two) times daily.   pyridostigmine 60 MG tablet Commonly known as: MESTINON Take 0.5 tablets (30 mg total) by mouth daily.   senna-docusate 8.6-50 MG tablet Commonly known as: Senokot S Take 2 tablets by mouth daily.   sertraline 50 MG tablet Commonly known as: ZOLOFT Take 50 mg by mouth daily.   Systane 0.4-0.3 % Soln Generic drug: Polyethyl Glycol-Propyl Glycol Apply to eye.   triamcinolone ointment 0.1 % Commonly known as: KENALOG Apply 1 application topically 2 (two) times daily. Start taking on: October 12, 2020   Vitamin D 50 MCG (2000 UT) Caps Take 1 capsule (2,000 Units total) by mouth daily.        Review of Systems  Immunization History  Administered Date(s) Administered   Influenza Whole 12/12/2011, 12/31/2012   Influenza, High Dose Seasonal PF 12/20/2018, 01/02/2020   Influenza,inj,Quad PF,6+ Mos 01/04/2018   Influenza-Unspecified 12/31/2014, 01/06/2016, 01/01/2017   Moderna SARS-COV2 Booster Vaccination 01/24/2020   Moderna Sars-Covid-2 Vaccination 03/23/2019, 04/22/2019   Pneumococcal Conjugate-13 07/14/2014   Pneumococcal Polysaccharide-23 03/13/2005   Tdap 04/08/2015   Zoster Recombinat (Shingrix) 03/29/2017, 05/24/2017   Zoster, Live 03/13/2006   Pertinent  Health Maintenance Due  Topic Date Due   INFLUENZA VACCINE  10/11/2020   DEXA SCAN  Completed   PNA vac Low Risk Adult  Completed   Fall Risk  09/06/2020 07/21/2020 04/07/2020 01/16/2020 01/07/2020  Falls in the past year? 0 0 0 1 -  Number falls in past yr: 0 0 0 1 0  Injury with Fall? - - 0 1 0  Comment - - - - -  Risk for fall due to : - - - - -  Follow up - - - - -   Functional Status Survey:    Vitals:   10/11/20 1455  BP: 134/75  Pulse: 65  Resp: 18  Temp: 97.7 F (36.5 C)  SpO2: 96%  Weight: 136 lb (61.7 kg)  Height: 5' 0.5" (1.537 m)   Body mass index is 26.12  kg/m. Physical Exam Vitals reviewed.  Constitutional:      Appearance: Normal  appearance.  HENT:     Head: Normocephalic.     Nose: Nose normal.     Mouth/Throat:     Mouth: Mucous membranes are moist.     Pharynx: Oropharynx is clear.  Eyes:     Pupils: Pupils are equal, round, and reactive to light.  Cardiovascular:     Rate and Rhythm: Normal rate and regular rhythm.     Pulses: Normal pulses.     Heart sounds: Normal heart sounds.  Pulmonary:     Effort: Pulmonary effort is normal.     Breath sounds: Normal breath sounds.  Abdominal:     General: Abdomen is flat. Bowel sounds are normal.     Palpations: Abdomen is soft.  Musculoskeletal:        General: No swelling.     Cervical back: Neck supple.  Skin:    Comments: Macular Papular Rash in LE in Left Leg Not tender or any signs of Infection   Neurological:     General: No focal deficit present.     Mental Status: She is alert.  Psychiatric:        Mood and Affect: Mood normal.        Thought Content: Thought content normal.    Labs reviewed: Recent Labs    12/10/19 1203 12/10/19 1900 12/11/19 0445 07/22/20 0000 07/22/20 1300 08/02/20 0000  NA 139  --  136  --  140 141  K 3.9  --  3.7  --  4.9 4.6  CL 105  --  102  --  104 103  CO2 25  --  24  --  27* 24*  GLUCOSE 123*  --  108*  --   --   --   BUN 18  --  12  --  16 15  CREATININE 0.86   < > 0.72  --  0.8 0.8  CALCIUM 9.4  --  9.1 9.4  --  9.8   < > = values in this interval not displayed.   Recent Labs    12/10/19 1203 12/11/19 0445 07/22/20 0000 07/22/20 1300 08/02/20 0000  AST 23 27  --  17 17  ALT 16 19  --  12 12  ALKPHOS 47 54  --  71 67  BILITOT 0.9 1.3*  --   --   --   PROT 6.7 6.5  --   --   --   ALBUMIN 3.5 3.3* 3.9  --  4.2   Recent Labs    12/10/19 1900 12/11/19 0445 07/22/20 0000 08/02/20 0000  WBC 9.7 8.5 5.0 5.6  NEUTROABS 7.6  --  3.00  --   HGB 13.1 13.3 12.7 13.2  HCT 39.8 39.8 37 38  MCV 95.2 93.0  --   --   PLT  229 211 215 250   Lab Results  Component Value Date   TSH 3.50 07/22/2020   Lab Results  Component Value Date   HGBA1C 5.9 10/02/2017   Lab Results  Component Value Date   CHOL 211 (H) 05/14/2018   HDL 53 05/14/2018   LDLCALC 146 (H) 05/14/2018   TRIG 61 05/14/2018   CHOLHDL 4.0 05/14/2018    Significant Diagnostic Results in last 30 days:  No results found.  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 Also uses Thigh High Ted hoses  2. Moderate persistent asthma without complication Follows with Allergy and Immunology On Breo And Astelin and Flonase   3. Mixed incontinence urge and stress Has failed Myrbetriq Does not want to try anything else   4. Chronic idiopathic constipation It seems Senna has helped Also told to try Prune juice   5. Mild cognitive impairment with memory loss Doing well in AL Does Read and Puzzles   6. Essential hypertension Loose Control on Norvasc and Lopressor   7. Tremor, essential Seen Dr Jannifer Franklin   8. TIA (transient ischemic attack) On Eliquis 9 PAF On Lopressor and Eliquis 10 Depression Doing well on Zoloft Family/ staff Communication:   Labs/tests ordered:

## 2020-10-14 DIAGNOSIS — G5603 Carpal tunnel syndrome, bilateral upper limbs: Secondary | ICD-10-CM | POA: Diagnosis not present

## 2020-10-14 DIAGNOSIS — M6389 Disorders of muscle in diseases classified elsewhere, multiple sites: Secondary | ICD-10-CM | POA: Diagnosis not present

## 2020-10-14 DIAGNOSIS — R293 Abnormal posture: Secondary | ICD-10-CM | POA: Diagnosis not present

## 2020-10-14 DIAGNOSIS — R278 Other lack of coordination: Secondary | ICD-10-CM | POA: Diagnosis not present

## 2020-10-18 DIAGNOSIS — R278 Other lack of coordination: Secondary | ICD-10-CM | POA: Diagnosis not present

## 2020-10-18 DIAGNOSIS — G5603 Carpal tunnel syndrome, bilateral upper limbs: Secondary | ICD-10-CM | POA: Diagnosis not present

## 2020-10-18 DIAGNOSIS — M6389 Disorders of muscle in diseases classified elsewhere, multiple sites: Secondary | ICD-10-CM | POA: Diagnosis not present

## 2020-10-18 DIAGNOSIS — R293 Abnormal posture: Secondary | ICD-10-CM | POA: Diagnosis not present

## 2020-10-19 DIAGNOSIS — R278 Other lack of coordination: Secondary | ICD-10-CM | POA: Diagnosis not present

## 2020-10-19 DIAGNOSIS — M6389 Disorders of muscle in diseases classified elsewhere, multiple sites: Secondary | ICD-10-CM | POA: Diagnosis not present

## 2020-10-19 DIAGNOSIS — G5603 Carpal tunnel syndrome, bilateral upper limbs: Secondary | ICD-10-CM | POA: Diagnosis not present

## 2020-10-19 DIAGNOSIS — R293 Abnormal posture: Secondary | ICD-10-CM | POA: Diagnosis not present

## 2020-10-20 DIAGNOSIS — D1801 Hemangioma of skin and subcutaneous tissue: Secondary | ICD-10-CM | POA: Diagnosis not present

## 2020-10-20 DIAGNOSIS — L218 Other seborrheic dermatitis: Secondary | ICD-10-CM | POA: Diagnosis not present

## 2020-10-20 DIAGNOSIS — L821 Other seborrheic keratosis: Secondary | ICD-10-CM | POA: Diagnosis not present

## 2020-10-20 DIAGNOSIS — L400 Psoriasis vulgaris: Secondary | ICD-10-CM | POA: Diagnosis not present

## 2020-10-21 DIAGNOSIS — R278 Other lack of coordination: Secondary | ICD-10-CM | POA: Diagnosis not present

## 2020-10-21 DIAGNOSIS — M6389 Disorders of muscle in diseases classified elsewhere, multiple sites: Secondary | ICD-10-CM | POA: Diagnosis not present

## 2020-10-21 DIAGNOSIS — G5603 Carpal tunnel syndrome, bilateral upper limbs: Secondary | ICD-10-CM | POA: Diagnosis not present

## 2020-10-21 DIAGNOSIS — R293 Abnormal posture: Secondary | ICD-10-CM | POA: Diagnosis not present

## 2020-11-04 ENCOUNTER — Other Ambulatory Visit: Payer: Self-pay | Admitting: Adult Health

## 2020-11-04 MED ORDER — APIXABAN 2.5 MG PO TABS: 2.5000 mg | ORAL_TABLET | Freq: Two times a day (BID) | ORAL | 11 refills | Status: DC

## 2020-12-02 ENCOUNTER — Other Ambulatory Visit: Payer: Self-pay

## 2020-12-02 ENCOUNTER — Ambulatory Visit (INDEPENDENT_AMBULATORY_CARE_PROVIDER_SITE_OTHER): Payer: Medicare Other | Admitting: Internal Medicine

## 2020-12-02 ENCOUNTER — Encounter: Payer: Self-pay | Admitting: Internal Medicine

## 2020-12-02 VITALS — BP 128/62 | HR 64 | Ht 60.0 in | Wt 137.2 lb

## 2020-12-02 DIAGNOSIS — I480 Paroxysmal atrial fibrillation: Secondary | ICD-10-CM

## 2020-12-02 DIAGNOSIS — E7800 Pure hypercholesterolemia, unspecified: Secondary | ICD-10-CM | POA: Diagnosis not present

## 2020-12-02 DIAGNOSIS — I10 Essential (primary) hypertension: Secondary | ICD-10-CM

## 2020-12-02 DIAGNOSIS — Z8673 Personal history of transient ischemic attack (TIA), and cerebral infarction without residual deficits: Secondary | ICD-10-CM

## 2020-12-02 DIAGNOSIS — R55 Syncope and collapse: Secondary | ICD-10-CM

## 2020-12-02 NOTE — Patient Instructions (Signed)

## 2020-12-02 NOTE — Progress Notes (Signed)
Cardiology Office Note:    Date:  12/02/2020   ID:  Christina Lawson, DOB 27-Nov-1923, MRN [id number]  PCP:  Virgie Dad, MD  Cardiologist:  Elouise Munroe, MD  Electrophysiologist:  None   Referring MD: Virgie Dad, MD   Chief Complaint/Reason for Referral: Orthostatic hypotension, syncope/falls, PAF on [facility]   History of Present Illness:    Christina Lawson is a 87 y.o. female with a history of hypertension, hypothyroidism, tremor, hyperlipidemia, prior TIA, and hospitalization in 12/2019 for syncopal episode found to have significant orthostatic hypotension. She was started on pyridostigmine bromide (Mestinon) and has been tolerating this well overall.  She presents to the visit alone today.  She denies dizziness or lightheadedness and is taking precautions with standing and uses a walker.  She is wearing compression stockings and has not had any lower extremity swelling.  We reviewed indications and risks of continuing Eliquis in the setting of prior syncopal episode.  We participated in shared decision making and determined preventing stroke is her primary concern, and with no recent falls this is reasonable to continue.  Past Medical History:  Diagnosis Date   Allergic rhinitis    pollen and grass   Asthma    Atrial fibrillation ([place]) 05/13/2018   Atrial fibrillation ([place])    Blepharitis    Carpal tunnel syndrome, bilateral 07/03/2016   Family history of adverse reaction to anesthesia    " [place] (87YR OLD ) DIED UNDER ANESTHESTIA    High cholesterol    HOH (hard of hearing)    Hyperlipidemia    Irregular heart rhythm    Macular degeneration 2012   Osteoporosis    Sensorineural hearing loss, bilateral 2007   Shingles 2004   Skin cancer 2005   nose   Syncope and collapse 12/11/2019   TIA (transient ischemic attack)    Torn meniscus 2006   right knee   Tremor 2006   familiar.  dx by Dr. Quillian Quince   Unspecified essential hypertension     Unspecified hypothyroidism    Urinary frequency     Past Surgical History:  Procedure Laterality Date   ABDOMINAL HYSTERECTOMY  1977   ABDOMINAL HYSTERECTOMY     CATARACT EXTRACTION  1974   Left, IOL implants   CATARACT EXTRACTION Right 1979   IOL implants   [place]   eye   [place]   SKIN CANCER EXCISION  2005   nose   THYROIDECTOMY  1955   TONSILLECTOMY  1931   TONSILLECTOMY      Current Medications: No outpatient medications have been marked as taking for the 12/02/20 encounter (Office Visit) with Elouise Munroe, MD.   Reviewed meds, unable to pull into note.  Allergies:   Clindamycin/lincomycin, Clindamycin/lincomycin, Grass pollen([id number]-[id number]-swt vern), Ibuprofen, Ibuprofen, Other, Penicillins, and Penicillins   Social History   Tobacco Use   Smoking status: Never   Smokeless tobacco: Never  Vaping Use   Vaping Use: Never used  Substance Use Topics   Alcohol use: Not Currently    Comment: on occ.   Drug use: Never     Family History: The patient's family history includes Allergies in her father; Asthma in her father; Brain cancer in her mother; Breast  cancer in her cousin, cousin, maternal aunt, maternal aunt, paternal aunt, and sister; Breast cancer (age of onset: 25) in her sister; Breast cancer (age of onset: 71) in her daughter; Cancer in her mother, sister, and sister; Emphysema in her father; Endometrial cancer in her sister; Heart disease in her maternal grandfather; Hypertension in her mother; Stroke in an other family member.  ROS:   Please see the history of present illness.    All other systems reviewed and are negative.  EKGs/Labs/Other Studies Reviewed:    The following studies were reviewed today:  EKG: SR, LAD, inferior and anterior infarct pattern 64  bpm  Sinus bradycardia rate 59, left axis deviation, septal and inferior infarct pattern.  Recent Labs: 07/22/2020: TSH 3.50 08/02/2020: ALT 12; BUN 15; Creatinine 0.8; Hemoglobin 13.2; Platelets 250; Potassium 4.6; Sodium 141  Recent Lipid Panel    Component Value Date/Time   CHOL 211 (H) 05/14/2018 0219   TRIG 61 05/14/2018 0219   HDL 53 05/14/2018 0219   CHOLHDL 4.0 05/14/2018 0219   VLDL 12 05/14/2018 0219   LDLCALC 146 (H) 05/14/2018 0219    Physical Exam:    VS:  BP 128/62   Pulse 64   Ht 5' (1.524 m)   Wt 137 lb 3.2 oz (62.2 kg)   SpO2 94%   BMI 26.80 kg/m     Wt Readings from Last 5 Encounters:  12/02/20 137 lb 3.2 oz (62.2 kg)  10/11/20 136 lb (61.7 kg)  09/06/20 138 lb 12.8 oz (63 kg)  08/10/20 137 lb 3.2 oz (62.2 kg)  07/21/20 139 lb 6.4 oz (63.2 kg)    Constitutional: No acute distress Eyes: sclera non-icteric, normal conjunctiva and lids ENMT: Mask in place during COVID-19 omicron surge Cardiovascular: regular rhythm, normal rate, no murmurs. S1 and S2 normal. Radial pulses normal bilaterally. No jugular venous distention.  Respiratory: clear to auscultation bilaterally GI : normal bowel sounds, soft and nontender. No distention.   MSK: extremities warm, well perfused. No edema.  NEURO: grossly nonfocal exam, moves all extremities. PSYCH: alert and oriented x 3, normal mood and affect.   ASSESSMENT:    1. Paroxysmal atrial fibrillation (HCC)   2. Syncope, unspecified syncope type   3. H/O TIA (transient ischemic attack) and stroke   4. Essential hypertension   5. Pure hypercholesterolemia     PLAN:    Paroxysmal atrial fibrillation ([place]) - Plan: EKG 12-Lead Syncope, unspecified syncope type H/O TIA (transient ischemic attack) and stroke -She will continue Eliquis 2.5 mg twice daily at this time after shared decision-making on risks and benefits of continued anticoagulation.  No bleeding complications.  CHA2DS2-VASc score is 6 (hypertension,  female, age x2, prior TIA (2 points)). -Okay to continue low-dose metoprolol succinate 12.5 mg daily, hold for heart rates under 50 bpm  Essential hypertension -Continue amlodipine 5 mg daily, metoprolol succinate 12.5 mg daily  Pure hypercholesterolemia -Not currently on therapy, observing due to age.  Total time of encounter: 30 minutes total time of encounter, including 20 minutes spent in face-to-face patient care on the date of this encounter. This time includes coordination of care and counseling regarding above mentioned problem list. Remainder of non-face-to-face time involved reviewing chart documents/testing relevant to the patient encounter and documentation in the medical record. I have independently reviewed documentation from referring provider.   Cherlynn Kaiser, MD, [facility]     Medication Adjustments/Labs and Tests Ordered: Current medicines are reviewed at length with the  patient today.  Concerns regarding medicines are outlined above.   Orders Placed This Encounter  Procedures   EKG 12-Lead     No orders of the defined types were placed in this encounter.    Patient Instructions  Medication Instructions:  No Changes In Medications at this time.  *If you need a refill on your cardiac medications before your next appointment, please call your pharmacy*  Follow-Up: At [facility], you and your health needs are our priority.  As part of our continuing mission to provide you with exceptional heart care, we have created designated Provider Care Teams.  These Care Teams include your primary Cardiologist (physician) and Advanced Practice Providers (APPs -  Physician Assistants and Nurse Practitioners) who all work together to provide you with the care you need, when you need it.  Your next appointment:   6 month(s)  The format for your next appointment:   In Person  Provider:   Cherlynn Kaiser, MD

## 2020-12-06 ENCOUNTER — Encounter: Payer: Self-pay | Admitting: Adult Health

## 2020-12-06 ENCOUNTER — Non-Acute Institutional Stay: Payer: Medicare Other | Admitting: Adult Health

## 2020-12-06 ENCOUNTER — Other Ambulatory Visit: Payer: Self-pay

## 2020-12-06 VITALS — BP 122/88 | HR 61 | Temp 96.5°F | Ht 60.0 in | Wt 139.0 lb

## 2020-12-06 DIAGNOSIS — L409 Psoriasis, unspecified: Secondary | ICD-10-CM | POA: Insufficient documentation

## 2020-12-06 DIAGNOSIS — G3184 Mild cognitive impairment, so stated: Secondary | ICD-10-CM | POA: Diagnosis not present

## 2020-12-06 DIAGNOSIS — J4530 Mild persistent asthma, uncomplicated: Secondary | ICD-10-CM

## 2020-12-06 DIAGNOSIS — G20 Parkinson's disease: Secondary | ICD-10-CM

## 2020-12-06 DIAGNOSIS — E039 Hypothyroidism, unspecified: Secondary | ICD-10-CM

## 2020-12-06 DIAGNOSIS — G5603 Carpal tunnel syndrome, bilateral upper limbs: Secondary | ICD-10-CM | POA: Diagnosis not present

## 2020-12-06 DIAGNOSIS — I951 Orthostatic hypotension: Secondary | ICD-10-CM | POA: Diagnosis not present

## 2020-12-06 DIAGNOSIS — I480 Paroxysmal atrial fibrillation: Secondary | ICD-10-CM | POA: Diagnosis not present

## 2020-12-06 DIAGNOSIS — I10 Essential (primary) hypertension: Secondary | ICD-10-CM | POA: Diagnosis not present

## 2020-12-06 NOTE — Progress Notes (Signed)
Location: [facility]  POS: clinic  Provider:  Cindi Carbon, [place] [phone]   Code Status: DNR Goals of Care:  Advanced Directives 12/06/2020  Does Patient Have a Medical Advance Directive? Yes  Type of Advance Directive Living will;Out of facility DNR (pink MOST or yellow form);Healthcare Power of Attorney  Does patient want to make changes to medical advance directive? No - Patient declined  Copy of [place] in Chart? Yes - validated most recent copy scanned in chart (See row information)  Pre-existing out of facility DNR order (yellow form or pink MOST form) Yellow form placed in chart (order not valid for inpatient use)     Chief Complaint  Patient presents with   Medical Management of Chronic Issues    Patient returns to the clinic for 3 month follow up.    Medication Management    #4 covid and flu shot     HPI: Patient is a 87 y.o. female seen today for medical management of chronic diseases.  PMH significant for afib, asthma, CTS, HLD, HOH, OP, HTN, TIA, Hypothyroidism, NSTEMI, parkinsonism, MCI, orthostatic hypotension, and frequency.   Wt Readings from Last 3 Encounters:  12/06/20 139 lb (63 kg)  12/02/20 137 lb 3.2 oz (62.2 kg)  10/11/20 136 lb (61.7 kg)   Reports that she saw the cardiologist and she was doing well. Had an EKG that showed SR.   Reports mild weight gain and some sob with activity. This is not new and is relieved with rest.   Takes breo everyday for asthma and takes albuterol prn but has not needed it recently.   Appetite is good and she is sleeping well at night.   CTS: wears a glove on the right hand and splint on the left hand, has seen OT and does exercises. Still has some left hand numbness.  Feels like her vision is worsening and she is going to see the eye doc Saw dermatology and was diagnosed with psoriasis and has found relief with creams they recommended  Past Medical History:   Diagnosis Date   Allergic rhinitis    pollen and grass   Asthma    Atrial fibrillation ([place]) 05/13/2018   Atrial fibrillation ([place])    Blepharitis    Carpal tunnel syndrome, bilateral 07/03/2016   Family history of adverse reaction to anesthesia    " [place] (87YR OLD ) DIED UNDER ANESTHESTIA    High cholesterol    HOH (hard of hearing)    Hyperlipidemia    Irregular heart rhythm    Macular degeneration 2012   Mild cognitive impairment with memory loss 12/03/2019   Mild persistent asthma without complication 03/21/3233   Osteoporosis    Parkinsonism ([place]) 04/11/2017   Senile osteopenia 03/24/2015   Sensorineural hearing loss, bilateral 2007   Shingles 2004   Skin cancer 2005   nose   Syncope and collapse 12/11/2019   TIA (transient ischemic attack)    Torn meniscus 2006   right knee   Tremor 2006   familiar.  dx by Dr. Quillian Quince   Unspecified essential hypertension    Unspecified hypothyroidism    Urinary frequency     Past Surgical History:  Procedure Laterality Date   ABDOMINAL HYSTERECTOMY  1977   ABDOMINAL HYSTERECTOMY     CATARACT EXTRACTION  1974   Left, IOL implants   CATARACT EXTRACTION Right 1979   IOL implants   [place]  COSMETIC SURGERY  1995   eye   CYSTOCELE REPAIR  1993   DILATION AND CURETTAGE OF UTERUS  1977   EYE SURGERY     GALLBLADDER SURGERY  1981   HIATAL HERNIA REPAIR  1977   SKIN CANCER EXCISION  2005   nose   THYROIDECTOMY  1955   TONSILLECTOMY  1931   TONSILLECTOMY      Allergies  Allergen Reactions   Clindamycin/Lincomycin Diarrhea    diarrhea   Clindamycin/Lincomycin     Listed on MAR -- reaction unknown   Grass Pollen(K-O-R-T-Swt Vern)     Listed on MAR -- reaction unknown   Ibuprofen Other (See Comments)    Long time ago/ list on paper work   Ibuprofen     Listed on MAR -- reaction unknown   Other     Preparation H Listed on MAR -- reaction unknown   Penicillins Other  (See Comments)    Did it involve swelling of the face/tongue/throat, SOB, or low BP? Unknown Did it involve sudden or severe rash/hives, skin peeling, or any reaction on the inside of your mouth or nose? Unknown Did you need to seek medical attention at a hospital or doctor's office? Unknown When did it last happen?       If all above answers are "NO", may proceed with cephalosporin use.   Penicillins     Listed on MAR -- reaction unknown    Outpatient Encounter Medications as of 12/06/2020  Medication Sig   albuterol (VENTOLIN HFA) 108 (90 Base) MCG/ACT inhaler Inhale 2 puffs into the lungs every 6 (six) hours as needed for wheezing.   amLODipine (NORVASC) 5 MG tablet Take 1 tablet (5 mg total) by mouth daily.   apixaban (ELIQUIS) 2.5 MG TABS tablet Take 1 tablet (2.5 mg total) by mouth 2 (two) times daily.   azelastine (ASTELIN) 0.1 % nasal spray Place 1 spray into both nostrils 2 (two) times daily. Use in each nostril as directed   Cholecalciferol (VITAMIN D) 2000 units CAPS Take 1 capsule (2,000 Units total) by mouth daily.   clobetasol (TEMOVATE) 0.05 % external solution Apply 1 application topically as needed.   clotrimazole (LOTRIMIN) 1 % cream Apply 1 application topically 2 (two) times daily.   fluticasone (FLONASE) 50 MCG/ACT nasal spray Place 1 spray into both nostrils 2 (two) times daily.   loratadine (CLARITIN) 10 MG tablet Take 1 tablet (10 mg total) by mouth daily.   mineral oil-hydrophilic petrolatum (AQUAPHOR) ointment Apply 1 application topically 2 (two) times daily as needed for dry skin.   oxymetazoline (AFRIN) 0.05 % nasal spray Place 2 sprays into both nostrils 2 (two) times daily as needed for congestion.    acetaminophen (TYLENOL) 325 MG tablet Take 650 mg by mouth as needed.   BREO ELLIPTA 100-25 MCG/INH AEPB Inhale 1 puff into the lungs every evening.   calcium carbonate (TUMS - DOSED IN MG ELEMENTAL CALCIUM) 500 MG chewable tablet Chew 1 tablet by mouth daily as  needed for indigestion.   diclofenac Sodium (VOLTAREN) 1 % GEL Apply topically as needed. MAR states: Dose: "Small amount", Frequency: "As needed", Instructions: "May keep at bedside and self administer"   HYDROcodone-acetaminophen (NORCO/VICODIN) 5-325 MG tablet Take 0.5 tablets by mouth 2 (two) times daily as needed for severe pain.   metoprolol succinate (TOPROL-XL) 25 MG 24 hr tablet Take 12.5 mg by mouth daily.   Multiple Vitamins-Minerals (PRESERVISION AREDS 2+MULTI VIT) CAPS Take 1 capsule by mouth 2 (two)  times daily.   Polyethyl Glycol-Propyl Glycol (SYSTANE) 0.4-0.3 % SOLN Apply to eye.   pyridostigmine (MESTINON) 60 MG tablet Take 0.5 tablets (30 mg total) by mouth daily.   senna-docusate (SENOKOT S) 8.6-50 MG tablet Take 2 tablets by mouth daily.   sertraline (ZOLOFT) 50 MG tablet Take 50 mg by mouth daily.   triamcinolone ointment (KENALOG) 0.1 % Apply 1 application topically 2 (two) times daily.   No facility-administered encounter medications on file as of 12/06/2020.    Review of Systems:  Review of Systems  Constitutional:  Negative for activity change, appetite change, chills, diaphoresis, fatigue, fever and unexpected weight change.  HENT:  Negative for congestion.   Eyes:  Positive for visual disturbance (feels like her vision is worsening). Negative for photophobia, pain, discharge, redness and itching.  Respiratory:  Negative for cough, shortness of breath (with exertion chronic) and wheezing.   Cardiovascular:  Positive for leg swelling. Negative for chest pain and palpitations.  Gastrointestinal:  Negative for abdominal distention, abdominal pain, constipation and diarrhea.  Genitourinary:  Negative for difficulty urinating and dysuria.  Musculoskeletal:  Positive for gait problem. Negative for arthralgias, back pain, joint swelling and myalgias.  Skin:  Positive for rash.  Neurological:  Positive for tremors. Negative for dizziness, seizures, syncope, facial  asymmetry, speech difficulty, weakness, light-headedness, numbness (left hand) and headaches.  Psychiatric/Behavioral:  Negative for agitation, behavioral problems and confusion.    Health Maintenance  Topic Date Due   COVID-19 Vaccine (4 - Booster for Moderna series) 05/23/2020   INFLUENZA VACCINE  10/11/2020   TETANUS/TDAP  04/07/2025   DEXA SCAN  Completed   Zoster Vaccines- Shingrix  Completed   HPV VACCINES  Aged Out    Physical Exam: Vitals:   12/06/20 1336  BP: 122/88  Pulse: 61  Temp: (!) 96.5 F (35.8 C)  SpO2: 97%  Weight: 139 lb (63 kg)  Height: 5' (1.524 m)   Body mass index is 27.15 kg/m. Physical Exam Vitals and nursing note reviewed.  Constitutional:      General: She is not in acute distress.    Appearance: She is not diaphoretic.  HENT:     Head: Normocephalic and atraumatic.     Right Ear: Tympanic membrane and ear canal normal.     Left Ear: Tympanic membrane and ear canal normal.     Nose: Nose normal. No congestion.     Mouth/Throat:     Mouth: Mucous membranes are moist.     Pharynx: Oropharynx is clear.  Eyes:     General:        Right eye: No discharge.        Left eye: No discharge.     Conjunctiva/sclera: Conjunctivae normal.     Comments: Pupils are equal and reactive but irregular shaped bilat  Neck:     Vascular: No JVD.  Cardiovascular:     Rate and Rhythm: Normal rate and regular rhythm.     Heart sounds: No murmur heard. Pulmonary:     Effort: Pulmonary effort is normal. No respiratory distress.     Breath sounds: Normal breath sounds. No wheezing.  Abdominal:     General: Bowel sounds are normal. There is no distension.     Palpations: Abdomen is soft.     Tenderness: There is no abdominal tenderness.  Musculoskeletal:     Comments: Trace edema bilat  Skin:    General: Skin is warm and dry.       Neurological:  Mental Status: She is alert and oriented to person, place, and time.  Psychiatric:        Mood and Affect:  Mood normal.    Labs reviewed: Basic Metabolic Panel: Recent Labs    12/10/19 1203 12/10/19 1900 12/11/19 0445 07/22/20 0000 07/22/20 1300 08/02/20 0000  NA 139  --  136  --  140 141  K 3.9  --  3.7  --  4.9 4.6  CL 105  --  102  --  104 103  CO2 25  --  24  --  27* 24*  GLUCOSE 123*  --  108*  --   --   --   BUN 18  --  12  --  16 15  CREATININE 0.86   < > 0.72  --  0.8 0.8  CALCIUM 9.4  --  9.1 9.4  --  9.8  TSH 3.945  --   --   --  3.50  --    < > = values in this interval not displayed.   Liver Function Tests: Recent Labs    12/10/19 1203 12/11/19 0445 07/22/20 0000 07/22/20 1300 08/02/20 0000  AST 23 27  --  17 17  ALT 16 19  --  12 12  ALKPHOS 47 54  --  71 67  BILITOT 0.9 1.3*  --   --   --   PROT 6.7 6.5  --   --   --   ALBUMIN 3.5 3.3* 3.9  --  4.2   No results for input(s): LIPASE, AMYLASE in the last 8760 hours. No results for input(s): AMMONIA in the last 8760 hours. CBC: Recent Labs    12/10/19 1900 12/11/19 0445 07/22/20 0000 08/02/20 0000  WBC 9.7 8.5 5.0 5.6  NEUTROABS 7.6  --  3.00  --   HGB 13.1 13.3 12.7 13.2  HCT 39.8 39.8 37 38  MCV 95.2 93.0  --   --   PLT 229 211 215 250   Lipid Panel: No results for input(s): CHOL, HDL, LDLCALC, TRIG, CHOLHDL, LDLDIRECT in the last 8760 hours. Lab Results  Component Value Date   HGBA1C 5.9 10/02/2017    Procedures since last visit: No results found.  Assessment/Plan 1. Parkinsonism, unspecified Parkinsonism type ([place]) Doing well in the AL setting using her walker On mestinon for low bp without issues.   2. Mild cognitive impairment with memory loss Recommend repeat MMSE  3. Acquired hypothyroidism Lab Results  Component Value Date   TSH 3.50 07/22/2020    4. Mild persistent asthma without complication No recent issues Continue Breo  5. AF (paroxysmal atrial fibrillation) (HCC) In SR at visit with cardiology Continue Eliquis for CVA risk reduction  Rate is controlled with  Toprol  6. Essential hypertension Controlled  Continues on Norvasc   7. CTS Continues with mild symptoms on the left hand Continue wrist splint/glove, exercises.   8. Psoriasis Improved  Continue clobetasol and flucinonide.   Total time 1min:  time greater than 50% of total time spent doing pt counseling and coordination of care   Recommend both covid and flu vaccines this fall  Labs/tests ordered:  * No order type specified *CBC BMP prior to next apt Next appt:  F/U with Dr. Lyndel Safe in 3 months

## 2020-12-06 NOTE — Patient Instructions (Signed)
Recommend flu and covid vaccines this fall  F/U in 3 months

## 2020-12-22 DIAGNOSIS — Z23 Encounter for immunization: Secondary | ICD-10-CM | POA: Diagnosis not present

## 2021-01-17 DIAGNOSIS — H02105 Unspecified ectropion of left lower eyelid: Secondary | ICD-10-CM | POA: Diagnosis not present

## 2021-01-17 DIAGNOSIS — Z961 Presence of intraocular lens: Secondary | ICD-10-CM | POA: Diagnosis not present

## 2021-01-17 DIAGNOSIS — H02102 Unspecified ectropion of right lower eyelid: Secondary | ICD-10-CM | POA: Diagnosis not present

## 2021-01-17 DIAGNOSIS — H52203 Unspecified astigmatism, bilateral: Secondary | ICD-10-CM | POA: Diagnosis not present

## 2021-01-20 ENCOUNTER — Encounter: Payer: Self-pay | Admitting: Family

## 2021-01-20 ENCOUNTER — Ambulatory Visit (INDEPENDENT_AMBULATORY_CARE_PROVIDER_SITE_OTHER): Payer: Medicare Other | Admitting: Family

## 2021-01-20 ENCOUNTER — Other Ambulatory Visit: Payer: Self-pay

## 2021-01-20 DIAGNOSIS — Z0000 Encounter for general adult medical examination without abnormal findings: Secondary | ICD-10-CM | POA: Diagnosis not present

## 2021-01-20 NOTE — Progress Notes (Signed)
This service is provided via telemedicine  No vital signs collected/recorded due to the encounter was a telemedicine visit.   Location of patient (ex: home, work):  Home  Patient consents to a telephone visit:  Yes  Location of the provider (ex: office, home):  [employer].  Name of any referring provider:  Virgie Dad, MD   Names of all persons participating in the telemedicine service and their role in the encounter:  Patient, Heriberto Antigua, [place], [place], Webb Silversmith, NP.    Time spent on call:  8 minutes spent on the phone with Medical Assistant.       Subjective:   Christina Lawson is a 87 y.o. female who presents for Medicare Annual (Subsequent) preventive examination.  Review of Systems     Cardiac Risk Factors include: advanced age (>78men, >70 women);hypertension     Objective:    Today's Vitals   01/20/21 0954  PainSc: 5    There is no height or weight on file to calculate BMI.  Advanced Directives 01/20/2021 12/06/2020 10/11/2020 07/21/2020 04/07/2020 01/16/2020 01/07/2020  Does Patient Have a Medical Advance Directive? Yes Yes Yes Yes Yes Yes -  Type of Paramedic of [place];Living will;Out of facility DNR (pink MOST or yellow form) Living will;Out of facility DNR (pink MOST or yellow form);Healthcare Power of Attorney Living will;Out of facility DNR (pink MOST or yellow form) Out of facility DNR (pink MOST or yellow form);Living will Out of facility DNR (pink MOST or yellow form) [place];Living will;Out of facility DNR (pink MOST or yellow form) Out of facility DNR (pink MOST or yellow form)  Does patient want to make changes to medical advance directive? No - Patient declined No - Patient declined No - Patient declined No - Patient declined No - Patient declined No - Patient declined No - Patient declined  Copy of [place] in Chart? Yes - validated most recent copy scanned in chart (See  row information) Yes - validated most recent copy scanned in chart (See row information) - - - Yes - validated most recent copy scanned in chart (See row information) -  Pre-existing out of facility DNR order (yellow form or pink MOST form) - Yellow form placed in chart (order not valid for inpatient use) Yellow form placed in chart (order not valid for inpatient use) Yellow form placed in chart (order not valid for inpatient use);Pink MOST form placed in chart (order not valid for inpatient use) - - Pink MOST/Yellow Form most recent copy in chart - Physician notified to receive inpatient order    Current Medications (verified) Outpatient Encounter Medications as of 01/20/2021  Medication Sig   acetaminophen (TYLENOL) 325 MG tablet Take 650 mg by mouth as needed.   albuterol (VENTOLIN HFA) 108 (90 Base) MCG/ACT inhaler Inhale 2 puffs into the lungs every 6 (six) hours as needed for wheezing.   amLODipine (NORVASC) 5 MG tablet Take 1 tablet (5 mg total) by mouth daily.   apixaban (ELIQUIS) 2.5 MG TABS tablet Take 1 tablet (2.5 mg total) by mouth 2 (two) times daily.   azelastine (ASTELIN) 0.1 % nasal spray Place 1 spray into both nostrils 2 (two) times daily. Use in each nostril as directed   BREO ELLIPTA 100-25 MCG/INH AEPB Inhale 1 puff into the lungs every evening.   calcium carbonate (TUMS - DOSED IN MG ELEMENTAL CALCIUM) 500 MG chewable tablet Chew 1 tablet by mouth daily as needed for indigestion.  Cholecalciferol (VITAMIN D) 2000 units CAPS Take 1 capsule (2,000 Units total) by mouth daily.   clobetasol (TEMOVATE) 0.05 % external solution Apply 1 application topically as needed.   clotrimazole (LOTRIMIN) 1 % cream Apply 1 application topically 2 (two) times daily.   diclofenac Sodium (VOLTAREN) 1 % GEL Apply topically as needed. MAR states: Dose: "Small amount", Frequency: "As needed", Instructions: "May keep at bedside and self administer"   fluocinonide cream (LIDEX) 6.44 % Apply 1  application topically as needed.   fluticasone (FLONASE) 50 MCG/ACT nasal spray Place 1 spray into both nostrils 2 (two) times daily.   HYDROcodone-acetaminophen (NORCO/VICODIN) 5-325 MG tablet Take 0.5 tablets by mouth 2 (two) times daily as needed for severe pain.   loratadine (CLARITIN) 10 MG tablet Take 1 tablet (10 mg total) by mouth daily.   metoprolol succinate (TOPROL-XL) 25 MG 24 hr tablet Take 12.5 mg by mouth daily.   mineral oil-hydrophilic petrolatum (AQUAPHOR) ointment Apply 1 application topically 2 (two) times daily as needed for dry skin.   Multiple Vitamins-Minerals (PRESERVISION AREDS 2+MULTI VIT) CAPS Take 1 capsule by mouth 2 (two) times daily.   oxymetazoline (AFRIN) 0.05 % nasal spray Place 2 sprays into both nostrils 2 (two) times daily as needed for congestion.    Polyethyl Glycol-Propyl Glycol (SYSTANE) 0.4-0.3 % SOLN Apply to eye.   pyridostigmine (MESTINON) 60 MG tablet Take 0.5 tablets (30 mg total) by mouth daily.   senna-docusate (SENOKOT S) 8.6-50 MG tablet Take 2 tablets by mouth daily.   sertraline (ZOLOFT) 50 MG tablet Take 50 mg by mouth daily.   No facility-administered encounter medications on file as of 01/20/2021.    Allergies (verified) Clindamycin/lincomycin, Clindamycin/lincomycin, Grass pollen([id number]-swt vern), Ibuprofen, Ibuprofen, Other, Penicillins, and Penicillins   History: Past Medical History:  Diagnosis Date   Allergic rhinitis    pollen and grass   Asthma    Atrial fibrillation ([place]) 05/13/2018   Atrial fibrillation (HCC)    Blepharitis    Carpal tunnel syndrome, bilateral 07/03/2016   Family history of adverse reaction to anesthesia    " [place] (87YR OLD ) DIED UNDER ANESTHESTIA    High cholesterol    HOH (hard of hearing)    Hyperlipidemia    Irregular heart rhythm    Macular degeneration 2012   Mild cognitive impairment with memory loss 12/03/2019   Mild persistent asthma without complication 0/34/7425    Osteoporosis    Parkinsonism ([place]) 04/11/2017   Senile osteopenia 03/24/2015   Sensorineural hearing loss, bilateral 2007   Shingles 2004   Skin cancer 2005   nose   Syncope and collapse 12/11/2019   TIA (transient ischemic attack)    Torn meniscus 2006   right knee   Tremor 2006   familiar.  dx by Dr. Quillian Quince   Unspecified essential hypertension    Unspecified hypothyroidism    Urinary frequency    Past Surgical History:  Procedure Laterality Date   ABDOMINAL HYSTERECTOMY  1977   ABDOMINAL HYSTERECTOMY     CATARACT EXTRACTION  1974   Left, IOL implants   CATARACT EXTRACTION Right 1979   IOL implants   [place]   eye   [place]   SKIN CANCER EXCISION  2005  nose   THYROIDECTOMY  1955   TONSILLECTOMY  19   TONSILLECTOMY     Family History  Problem Relation Age of Onset   Emphysema Father    Allergies Father    Asthma Father    Brain cancer Mother    Cancer Mother        Brain   Hypertension Mother    Cancer Sister        Cervical   Endometrial cancer Sister    Breast cancer Sister 75   Cancer Sister        Breast, metastasized to bone   Breast cancer Sister        unsuer of age   Breast cancer Daughter 57   Heart disease Maternal Grandfather    Stroke Other    Breast cancer Maternal Aunt    Breast cancer Paternal Aunt    Breast cancer Cousin    Breast cancer Maternal Aunt    Breast cancer Cousin    Social History   Socioeconomic History   Marital status: Single    Spouse name: Not on file   Number of children: 2   Years of education: college   Highest education level: Not on file  Occupational History   Occupation: retired Education officer, museum    Comment: retired  Tobacco Use   Smoking status: Never   Smokeless tobacco: Never  Scientific laboratory technician Use: Never  used  Substance and Sexual Activity   Alcohol use: Not Currently    Comment: on occ.   Drug use: Never   Sexual activity: Not Currently  Other Topics Concern   Not on file  Social History Narrative   ** Merged History Encounter **       Lives at [facility]. Married 1958. Never smoked Alcohol minimal Exercise predominantly walking, water aerobic Retired Education officer, museum     Social Determinants of Radio broadcast assistant Strain: Not on file  Food Insecurity: Not on file  Transportation Needs: Not on file  Physical Activity: Not on file  Stress: Not on file  Social Connections: Not on file    Tobacco Counseling Counseling given: Not Answered   Clinical Intake:  Pre-visit preparation completed: No  Pain Score: 5  Pain Type: Chronic pain Pain Location: Back Pain Orientation: Lower Pain Radiating Towards: no Pain Descriptors / Indicators: Aching Pain Onset: Other (comment) (several years) Pain Frequency: Intermittent Pain Relieving Factors: Pain medication Effect of Pain on Daily Activities: No  Pain Relieving Factors: Pain medication  BMI - recorded: 27.15 Nutritional Status: BMI 25 -29 Overweight Nutritional Risks: None Diabetes: No  How often do you need to have someone help you when you read instructions, pamphlets, or other written materials from your doctor or pharmacy?: 1 - Never What is the last grade level you completed in school?: Master's Degree  Diabetic?No   Interpreter Needed?: No  Information entered by :: Byrdie Miyazaki ,FNP-C   Activities of Daily Living In your present state of health, do you have any difficulty performing the following activities: 01/20/2021  Hearing? Y  Comment wears hearing aids  Vision? Y  Difficulty concentrating or making decisions? Y  Comment Remembering  Walking or climbing stairs? Y  Dressing or bathing? N  Doing errands, shopping? Y  Comment resides in ALF  Preparing Food and eating ? N   Using the Toilet? Y  Comment constipation at times  In the past six months, have you accidently leaked urine? Darreld Mclean  Comment wears depends  Do you have problems with loss of bowel control? N  Managing your Medications? Y  Comment resides in ALF  Managing your Finances? Y  Comment Daughter  Housekeeping or managing your Housekeeping? Y  Comment resides ALF  Some recent data might be hidden    Patient Care Team: Virgie Dad, MD as PCP - General (Internal Medicine) Elouise Munroe, MD as PCP - Cardiology (Cardiology) [facility], Armando Reichert, MD as Consulting Physician (Pulmonary Disease) Verl Blalock, Marijo Conception, MD (Inactive) as Consulting Physician (Cardiology) Latanya Maudlin, MD as Consulting Physician (Orthopedic Surgery) Kathrynn Ducking, MD as Consulting Physician (Neurology) [place], Jennefer Bravo, MD as Referring Physician (Dermatology) Gean Quint, MD (Inactive) as Consulting Physician (Allergy and Immunology) Shon Hough, MD as Consulting Physician (Ophthalmology) Paula Compton, MD as Consulting Physician (Obstetrics and Gynecology) Vicie Mutters, MD as Consulting Physician (Otolaryngology) Raeanne Gathers, AUD (Audiology) Carmel Sacramento, CCC-A (Audiology) Gayland Curry, DO (Geriatric Medicine) Elouise Munroe, MD (Cardiology)  Indicate any recent Medical Services you may have received from other than [facility] providers in the past year (date may be approximate).     Assessment:   This is a routine wellness examination for [place].  Hearing/Vision screen Hearing Screening - Comments:: Some hearing concerns. Patient wears hearing aids.  Vision Screening - Comments:: Some vision concerns. Patient last eye exam was November 2022. Patient wears prescription glasses.   Dietary issues and exercise activities discussed: Current Exercise Habits: Home exercise routine, Type of exercise: stretching;walking, Time (Minutes): 25, Frequency  (Times/Week): 5, Weekly Exercise (Minutes/Week): 125, Intensity: Mild, Exercise limited by: Other - see comments (lower back pain)   Goals Addressed             This Visit's Progress    Increase water intake   On track    Increase water intake by 1-2 glasses per day     Patient Stated   Not on track    Improve hearing and sight       Depression Screen PHQ 2/9 Scores 01/20/2021 01/16/2020 07/30/2019 03/26/2019 01/15/2019 08/21/2018 04/17/2018  PHQ - 2 Score 0 0 0 0 0 0 0    Fall Risk Fall Risk  01/20/2021 12/06/2020 09/06/2020 07/21/2020 04/07/2020  Falls in the past year? 0 0 0 0 0  Number falls in past yr: 0 0 0 0 0  Injury with Fall? 0 - - - 0  Comment - - - - -  Risk for fall due to : No Fall Risks - - - -  Follow up Falls evaluation completed Falls evaluation completed - - -    FALL RISK PREVENTION PERTAINING TO THE HOME:  Any stairs in or around the home? No  If so, are there any without handrails? No  Home free of loose throw rugs in walkways, pet beds, electrical cords, etc? No  Adequate lighting in your home to reduce risk of falls? Yes   ASSISTIVE DEVICES UTILIZED TO PREVENT FALLS:  Life alert? No  Use of a cane, walker or w/c? Yes  Grab bars in the bathroom? Yes  Shower chair or bench in shower? Yes  Elevated toilet seat or a handicapped toilet? Yes   TIMED UP AND GO:  Was the test performed? No .  Length of time to ambulate 10 feet: N/A sec.   Gait slow and steady with assistive device  Cognitive Function: MMSE - Mini Mental State Exam 11/20/2017 09/19/2016 09/29/2015  Orientation to time 5  4 5  Orientation to Place 5 5 5   Registration 3 3 3   Attention/ Calculation 5 5 5   Recall 1 0 3  Language- name 2 objects 2 2 2   Language- repeat 1 1 1   Language- follow 3 step command 3 3 3   Language- read & follow direction 1 1 1   Write a sentence 1 1 1   Copy design 1 1 1   Total score 28 26 30      6CIT Screen 01/20/2021 01/16/2020 01/15/2019  What Year? 0 points  0 points 0 points  What month? 0 points 0 points 0 points  What time? 0 points 0 points 0 points  Count back from 20 0 points 0 points 0 points  Months in reverse 0 points 0 points 2 points  Repeat phrase 2 points 0 points 0 points  Total Score 2 0 2    Immunizations Immunization History  Administered Date(s) Administered   Influenza Whole 12/12/2011, 12/31/2012   Influenza, High Dose Seasonal PF 12/20/2018, 01/02/2020, 12/15/2020   Influenza,inj,Quad PF,6+ Mos 01/04/2018   Influenza-Unspecified 12/31/2014, 01/06/2016, 01/01/2017   Moderna SARS-COV2 Booster Vaccination 01/24/2020   Moderna Sars-Covid-2 Vaccination 03/23/2019, 04/22/2019, 12/22/2020   Pneumococcal Conjugate-13 07/14/2014   Pneumococcal Polysaccharide-23 03/13/2005   Tdap 04/08/2015   Zoster Recombinat (Shingrix) 03/29/2017, 05/24/2017   Zoster, Live 03/13/2006    TDAP status: Up to date  Flu Vaccine status: Up to date  Pneumococcal vaccine status: Up to date  Covid-19 vaccine status: Completed vaccines  Qualifies for Shingles Vaccine? Yes   Zostavax completed Yes   Shingrix Completed?: Yes  Screening Tests Health Maintenance  Topic Date Due   COVID-19 Vaccine (4 - Booster for Moderna series) 02/16/2021   TETANUS/TDAP  04/07/2025   Pneumonia Vaccine 20+ Years old  Completed   INFLUENZA VACCINE  Completed   DEXA SCAN  Completed   Zoster Vaccines- Shingrix  Completed   HPV VACCINES  Aged Out    Health Maintenance  There are no preventive care reminders to display for this patient.  Colorectal cancer screening: No longer required.   Mammogram status: No longer required due to advance age .  Bone Density status: Completed 01/29/2015. Results reflect: Bone density results: OSTEOPENIA. Repeat every N/A years.  Lung Cancer Screening: (Low Dose CT Chest recommended if Age 4-80 years, 30 pack-year currently smoking OR have quit w/in 15years.) does not qualify.   Lung Cancer Screening Referral: No    Additional Screening:  Hepatitis C Screening: does not qualify; Completed No   Vision Screening: Recommended annual ophthalmology exams for early detection of glaucoma and other disorders of the eye. Is the patient up to date with their annual eye exam?  Yes  Who is the provider or what is the name of the office in which the patient attends annual eye exams? Dr.[email or web address]  If pt is not established with a provider, would they like to be referred to a provider to establish care? No .   Dental Screening: Recommended annual dental exams for proper oral hygiene  Community Resource Referral / Chronic Care Management: CRR required this visit?  No   CCM required this visit?  No      Plan:     I have personally reviewed and noted the following in the patient's chart:   Medical and social history Use of alcohol, tobacco or illicit drugs  Current medications and supplements including opioid prescriptions.  Functional ability and status Nutritional status Physical activity Advanced directives List of other physicians Hospitalizations,  surgeries, and ER visits in previous 12 months Vitals Screenings to include cognitive, depression, and falls Referrals and appointments  In addition, I have reviewed and discussed with patient certain preventive protocols, quality metrics, and best practice recommendations. A written personalized care plan for preventive services as well as general preventive health recommendations were provided to patient.     Sandrea Hughs, NP   01/20/2021   Nurse Notes: Has completed COVID-19 vaccine and Influenza vaccine given at [facility] will obtain records.

## 2021-01-20 NOTE — Patient Instructions (Signed)
Christina Lawson , Thank you for taking time to come for your Medicare Wellness Visit. I appreciate your ongoing commitment to your health goals. Please review the following plan we discussed and let me know if I can assist you in the future.   Screening recommendations/referrals: Colonoscopy N/A Mammogram N/A Bone Density Up to date  Recommended yearly ophthalmology/optometry visit for glaucoma screening and checkup Recommended yearly dental visit for hygiene and checkup  Vaccinations: Influenza vaccine Up to date  Pneumococcal vaccine Up to date  Tdap vaccine Up to date  Shingles vaccine Up to date     Advanced directives: yes   Conditions/risks identified: Advance age female > 47 yrs and Hypertension   Next appointment: 1 year    Preventive Care 85 Years and Older, Female Preventive care refers to lifestyle choices and visits with your health care provider that can promote health and wellness. What does preventive care include? A yearly physical exam. This is also called an annual well check. Dental exams once or twice a year. Routine eye exams. Ask your health care provider how often you should have your eyes checked. Personal lifestyle choices, including: Daily care of your teeth and gums. Regular physical activity. Eating a healthy diet. Avoiding tobacco and drug use. Limiting alcohol use. Practicing safe sex. Taking low-dose aspirin every day. Taking vitamin and mineral supplements as recommended by your health care provider. What happens during an annual well check? The services and screenings done by your health care provider during your annual well check will depend on your age, overall health, lifestyle risk factors, and family history of disease. Counseling  Your health care provider may ask you questions about your: Alcohol use. Tobacco use. Drug use. Emotional well-being. Home and relationship well-being. Sexual activity. Eating habits. History of  falls. Memory and ability to understand (cognition). Work and work Statistician. Reproductive health. Screening  You may have the following tests or measurements: Height, weight, and BMI. Blood pressure. Lipid and cholesterol levels. These may be checked every 5 years, or more frequently if you are over 74 years old. Skin check. Lung cancer screening. You may have this screening every year starting at age 61 if you have a 30-pack-year history of smoking and currently smoke or have quit within the past 15 years. Fecal occult blood test (FOBT) of the stool. You may have this test every year starting at age 73. Flexible sigmoidoscopy or colonoscopy. You may have a sigmoidoscopy every 5 years or a colonoscopy every 10 years starting at age 33. Hepatitis C blood test. Hepatitis B blood test. Sexually transmitted disease (STD) testing. Diabetes screening. This is done by checking your blood sugar (glucose) after you have not eaten for a while (fasting). You may have this done every 1-3 years. Bone density scan. This is done to screen for osteoporosis. You may have this done starting at age 11. Mammogram. This may be done every 1-2 years. Talk to your health care provider about how often you should have regular mammograms. Talk with your health care provider about your test results, treatment options, and if necessary, the need for more tests. Vaccines  Your health care provider may recommend certain vaccines, such as: Influenza vaccine. This is recommended every year. Tetanus, diphtheria, and acellular pertussis (Tdap, Td) vaccine. You may need a Td booster every 10 years. Zoster vaccine. You may need this after age 68. Pneumococcal 13-valent conjugate (PCV13) vaccine. One dose is recommended after age 31. Pneumococcal polysaccharide (PPSV23) vaccine. One dose is recommended  after age 40. Talk to your health care provider about which screenings and vaccines you need and how often you need  them. This information is not intended to replace advice given to you by your health care provider. Make sure you discuss any questions you have with your health care provider. Document Released: 03/26/2015 Document Revised: 11/17/2015 Document Reviewed: 12/29/2014 Elsevier Interactive Patient Education  2017 [place] Prevention in the Home Falls can cause injuries. They can happen to people of all ages. There are many things you can do to make your home safe and to help prevent falls. What can I do on the outside of my home? Regularly fix the edges of walkways and driveways and fix any cracks. Remove anything that might make you trip as you walk through a door, such as a raised step or threshold. Trim any bushes or trees on the path to your home. Use bright outdoor lighting. Clear any walking paths of anything that might make someone trip, such as rocks or tools. Regularly check to see if handrails are loose or broken. Make sure that both sides of any steps have handrails. Any raised decks and porches should have guardrails on the edges. Have any leaves, snow, or ice cleared regularly. Use sand or salt on walking paths during winter. Clean up any spills in your garage right away. This includes oil or grease spills. What can I do in the bathroom? Use night lights. Install grab bars by the toilet and in the tub and shower. Do not use towel bars as grab bars. Use non-skid mats or decals in the tub or shower. If you need to sit down in the shower, use a plastic, non-slip stool. Keep the floor dry. Clean up any water that spills on the floor as soon as it happens. Remove soap buildup in the tub or shower regularly. Attach bath mats securely with double-sided non-slip rug tape. Do not have throw rugs and other things on the floor that can make you trip. What can I do in the bedroom? Use night lights. Make sure that you have a light by your bed that is easy to reach. Do not use  any sheets or blankets that are too big for your bed. They should not hang down onto the floor. Have a firm chair that has side arms. You can use this for support while you get dressed. Do not have throw rugs and other things on the floor that can make you trip. What can I do in the kitchen? Clean up any spills right away. Avoid walking on wet floors. Keep items that you use a lot in easy-to-reach places. If you need to reach something above you, use a strong step stool that has a grab bar. Keep electrical cords out of the way. Do not use floor polish or wax that makes floors slippery. If you must use wax, use non-skid floor wax. Do not have throw rugs and other things on the floor that can make you trip. What can I do with my stairs? Do not leave any items on the stairs. Make sure that there are handrails on both sides of the stairs and use them. Fix handrails that are broken or loose. Make sure that handrails are as long as the stairways. Check any carpeting to make sure that it is firmly attached to the stairs. Fix any carpet that is loose or worn. Avoid having throw rugs at the top or bottom of the stairs. If you  do have throw rugs, attach them to the floor with carpet tape. Make sure that you have a light switch at the top of the stairs and the bottom of the stairs. If you do not have them, ask someone to add them for you. What else can I do to help prevent falls? Wear shoes that: Do not have high heels. Have rubber bottoms. Are comfortable and fit you well. Are closed at the toe. Do not wear sandals. If you use a stepladder: Make sure that it is fully opened. Do not climb a closed stepladder. Make sure that both sides of the stepladder are locked into place. Ask someone to hold it for you, if possible. Clearly mark and make sure that you can see: Any grab bars or handrails. First and last steps. Where the edge of each step is. Use tools that help you move around (mobility aids)  if they are needed. These include: Canes. Walkers. Scooters. Crutches. Turn on the lights when you go into a dark area. Replace any light bulbs as soon as they burn out. Set up your furniture so you have a clear path. Avoid moving your furniture around. If any of your floors are uneven, fix them. If there are any pets around you, be aware of where they are. Review your medicines with your doctor. Some medicines can make you feel dizzy. This can increase your chance of falling. Ask your doctor what other things that you can do to help prevent falls. This information is not intended to replace advice given to you by your health care provider. Make sure you discuss any questions you have with your health care provider. Document Released: 12/24/2008 Document Revised: 08/05/2015 Document Reviewed: 04/03/2014 Elsevier Interactive Patient Education  2017 [employer].

## 2021-02-15 ENCOUNTER — Encounter: Payer: Self-pay | Admitting: Allergy & Immunology

## 2021-02-15 ENCOUNTER — Ambulatory Visit (INDEPENDENT_AMBULATORY_CARE_PROVIDER_SITE_OTHER): Payer: Medicare Other | Admitting: Allergy & Immunology

## 2021-02-15 ENCOUNTER — Other Ambulatory Visit: Payer: Self-pay

## 2021-02-15 VITALS — BP 150/70 | HR 62 | Temp 97.6°F | Resp 20 | Ht 60.0 in | Wt 135.2 lb

## 2021-02-15 DIAGNOSIS — J302 Other seasonal allergic rhinitis: Secondary | ICD-10-CM | POA: Diagnosis not present

## 2021-02-15 DIAGNOSIS — J4540 Moderate persistent asthma, uncomplicated: Secondary | ICD-10-CM

## 2021-02-15 NOTE — Progress Notes (Signed)
FOLLOW UP  Date of Service/Encounter:  02/15/21   Assessment:   Moderate persistent asthma without complication    Seasonal allergic rhinitis   TIA (summer 2019) - with subsequent diagnosis of paroxysmal atrial fibrillation (on Eliquis)   Essential tremor with recent fall (October 2021) - now using a walker   Plan/Recommendations:   1. Mild persistent asthma, uncomplicated - Lung testing looked AWESOME today.  - Keep up the good work.  - Continue with Breo 100/25 one puff once daily.  - Continue with albuterol 2-4 puffs every 4-6 hours as needed.   2. Allergic rhinoconjunctivitis - Continue with fluticasone and azelastine: do one spray per nostril twice daily - Continue with Claritin (lortadine) 10mg  as needed.   2. Return in about 6 months (around 08/16/2021).    Subjective:   Christina Lawson is a 87 y.o. female presenting today for follow up of  Chief Complaint  Patient presents with   Follow-up    Coughing sometimes but controlled.     Christina Lawson has a history of the following: Patient Active Problem List   Diagnosis Date Noted   Autonomic orthostatic hypotension 12/06/2020   Psoriasis 12/06/2020   Syncope and collapse 12/10/2019   AF (paroxysmal atrial fibrillation) ([place]) 12/10/2019   Mild cognitive impairment with memory loss 12/03/2019   Mixed incontinence urge and stress 07/30/2019   Psychophysiological insomnia 03/26/2019   Paroxysmal atrial fibrillation ([place]) 08/21/2018   NSTEMI (non-ST elevated myocardial infarction) ([place]) 05/15/2018   Atrial fibrillation with RVR ([place]) 05/13/2018   Acquired hypothyroidism 05/13/2018   Left thigh pain 10/10/2017   Anxiety 10/10/2017   H/O TIA (transient ischemic attack) and stroke 10/10/2017   Moderate persistent asthma without complication 25/63/8937   Parkinsonism ([place]) 04/11/2017   Mild persistent asthma without complication 34/28/7681   Seasonal allergic rhinitis 04/05/2017   Carpal tunnel  syndrome, bilateral 07/03/2016   Numbness and tingling in both hands 06/05/2016   Drooling 04/12/2016   Posterior vitreous detachment of both eyes 06/03/2015   Senile osteopenia 03/24/2015   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  [name] is a 87 y.o. female presenting for a follow up visit.  She was last seen in May 2022.  At that time, lung testing looked fantastic.  We continued with Breo 100 mcg 1 puff once daily as well as albuterol 2 to 4 puffs every 4-6 hours as needed.  For her allergic rhinitis, we continue with Flonase and Astelin.  We changed her Claritin to as needed at the last visit.  Since the last visit, she is actually done fairly well.  She reports that she was recently diagnosed with psoriasis.  She is trying to control it with some prescription topical medications.  It seems to be working fairly well.  As with everything, she has a fairly upbeat view overall.   Asthma/Respiratory Symptom History: She remains on her Breo 1 puff once daily.  She has not been using her rescue inhaler much  at all.  She has had no problems with her breathing and has not been on prednisone.  She has not been to the emergency room for her breathing.  She remains very cautious with any exposures.  She wears a mask and is very limited in her exposures. She has not been using her rescue inhaler at all.  She has not been to the emergency room.   Allergic Rhinitis  Symptom History: She is using her nasal sprays daily. She is using her loratadine on a PRN basis. She has not been on antibiotics at all since the last visit. Overall, symptoms are well controlled.  She remains on Eliquis for history of paroxysmal atrial fibrillation.  She follows with Dr. Margaretann Loveless in cardiology.  She has essential tremor.  She follows with neurology and her last visit was in May 2022.  They are not treating the tremor themselves because she was on Zoloft and metoprolol which were felt to be benefiting the tremor.  It has not worsened overall.  She remains at [facility].  She tells me that the facility is understaffed right now so things are a little bit chaotic.  She also tells me that they have been having a lot of scam phone calls from people asking for money who are in jail.  She has had a report 4 of them already, but some of her neighbors give away money anyway.  She continues to be an avid reader of the newspaper.  She tells me that she has not seen any Letters to the Editor from me lately.  Otherwise, there have been no changes to her past medical history, surgical history, family history, or social history.    Review of Systems  Constitutional: Negative.  Negative for fever, malaise/fatigue and weight loss.  HENT: Negative.  Negative for congestion, ear discharge and ear pain.   Eyes:  Negative for pain, discharge and redness.  Respiratory:  Negative for cough, sputum production, shortness of breath and wheezing.   Cardiovascular: Negative.  Negative for chest pain and palpitations.  Gastrointestinal:  Negative for abdominal pain and heartburn.  Skin: Negative.  Negative for itching and rash.  Neurological:  Negative for dizziness and headaches.  Endo/Heme/Allergies:  Negative for environmental allergies. Does not bruise/bleed easily.      Objective:   Blood pressure (!) 150/70, pulse 62, temperature 97.6 F (36.4 C), temperature source Temporal, resp.  rate 20, height 5' (1.524 m), weight 135 lb 3.2 oz (61.3 kg), SpO2 94 %. Body mass index is 26.4 kg/m.   Physical Exam:  Physical Exam Vitals reviewed.  Constitutional:      Appearance: She is well-developed.     Comments: Using a walker, but she gets along very fast with it.  Very delightful.  HENT:     Head: Normocephalic and atraumatic.     Right Ear: Tympanic membrane, ear canal and external ear normal.     Left Ear: Tympanic membrane, ear canal and external ear normal.     Nose: No nasal deformity, septal deviation, mucosal edema or rhinorrhea.     Right Turbinates: Enlarged, swollen and pale.     Left Turbinates: Enlarged, swollen and pale.     Right Sinus: No maxillary sinus tenderness or frontal sinus tenderness.     Left Sinus: No maxillary sinus tenderness or frontal sinus tenderness.     Mouth/Throat:     Mouth: Mucous membranes are not pale and not dry.     Pharynx: Uvula midline.  Eyes:     General:        Right eye: No discharge.        Left eye: No discharge.     Conjunctiva/sclera: Conjunctivae normal.     Right eye: Right conjunctiva is not injected. No chemosis.    Left eye: Left conjunctiva is not injected. No chemosis.    Pupils: Pupils are equal, round, and reactive to light.  Cardiovascular:     Rate and Rhythm: Normal rate and regular rhythm.     Heart sounds: Normal heart sounds.  Pulmonary:     Effort: Pulmonary effort is normal. No tachypnea, accessory muscle usage or respiratory distress.     Breath sounds: Normal breath sounds. No wheezing, rhonchi or rales.     Comments: Moving air well in all lung fields.  No increased work of breathing. Chest:     Chest wall: No tenderness.  Lymphadenopathy:     Cervical: No cervical adenopathy.  Skin:    General: Skin is warm.     Capillary Refill: Capillary refill takes less than 2 seconds.     Coloration: Skin is not pale.     Findings: No abrasion, erythema, petechiae or rash. Rash is not papular,  urticarial or vesicular.     Comments: No eczematous or urticarial lesions noted.  Neurological:     Mental Status: She is alert.  Psychiatric:        Behavior: Behavior is cooperative.      Diagnostic studies:   Spirometry: results normal (FEV1: 0.99/98%, FVC: 1.40/99%, FEV1/FVC: 71%).    Spirometry consistent with normal pattern.   Allergy Studies: none        Salvatore Marvel, MD  [facility] [place] [facility] [place]

## 2021-02-15 NOTE — Patient Instructions (Addendum)
1. Mild persistent asthma, uncomplicated - Lung testing looked AWESOME today.  - Keep up the good work.  - Continue with Breo 100/25 one puff once daily.  - Continue with albuterol 2-4 puffs every 4-6 hours as needed.   2. Allergic rhinoconjunctivitis - Continue with fluticasone and azelastine: do one spray per nostril twice daily - Continue with Claritin (lortadine) 10mg  as needed.   2. Return in about 6 months (around 08/16/2021).   Please inform us of any Emergency Department visits, hospitalizations, or changes in symptoms. Call us before going to the ED for breathing or allergy symptoms since we might be able to fit you in for a sick visit. Feel free to contact us anytime with any questions, problems, or concerns.  It was a pleasure to see you again today! I LOVE SEEING YOU!!!   Websites that have reliable patient information: 1. American Academy of Asthma, Allergy, and Immunology: www.aaaai.org 2. Food Allergy Research and Education (FARE): foodallergy.org 3. Mothers of Asthmatics: http://www.asthmacommunitynetwork.org 4. American College of Allergy, Asthma, and Immunology: www.acaai.org   COVID-19 Vaccine Information can be found at: ShippingScam.co.uk For questions related to vaccine distribution or appointments, please email [email or web address] or call [phone].     "Like" [place] on Facebook and Instagram for our latest updates!       Make sure you are registered to vote! If you have moved or changed any of your contact information, you will need to get this updated before voting!  In some cases, you MAY be able to register to vote online: [email or web address]

## 2021-03-07 DIAGNOSIS — I4891 Unspecified atrial fibrillation: Secondary | ICD-10-CM | POA: Diagnosis not present

## 2021-03-07 LAB — CBC AND DIFFERENTIAL
HCT: 37 (ref 36–46)
Hemoglobin: 11.9 — AB (ref 12.0–16.0)
Platelets: 198 (ref 150–399)
WBC: 4.7

## 2021-03-07 LAB — BASIC METABOLIC PANEL
BUN: 20 (ref 4–21)
CO2: 24 — AB (ref 13–22)
Chloride: 109 — AB (ref 99–108)
Creatinine: 0.7 (ref 0.5–1.1)
Glucose: 95
Potassium: 4.8 (ref 3.4–5.3)
Sodium: 144 (ref 137–147)

## 2021-03-07 LAB — CBC: RBC: 3.84 — AB (ref 3.87–5.11)

## 2021-03-07 LAB — COMPREHENSIVE METABOLIC PANEL: Calcium: 8.9 (ref 8.7–10.7)

## 2021-03-08 ENCOUNTER — Encounter: Payer: Self-pay | Admitting: Internal Medicine

## 2021-03-09 ENCOUNTER — Other Ambulatory Visit: Payer: Self-pay

## 2021-03-09 ENCOUNTER — Non-Acute Institutional Stay: Payer: Medicare Other | Admitting: Internal Medicine

## 2021-03-09 ENCOUNTER — Encounter: Payer: Self-pay | Admitting: Internal Medicine

## 2021-03-09 VITALS — BP 138/84 | HR 66 | Temp 97.7°F | Ht 60.0 in | Wt 137.0 lb

## 2021-03-09 DIAGNOSIS — H9193 Unspecified hearing loss, bilateral: Secondary | ICD-10-CM | POA: Diagnosis not present

## 2021-03-09 DIAGNOSIS — G3184 Mild cognitive impairment, so stated: Secondary | ICD-10-CM

## 2021-03-09 DIAGNOSIS — E039 Hypothyroidism, unspecified: Secondary | ICD-10-CM

## 2021-03-09 DIAGNOSIS — I951 Orthostatic hypotension: Secondary | ICD-10-CM | POA: Diagnosis not present

## 2021-03-09 DIAGNOSIS — J4530 Mild persistent asthma, uncomplicated: Secondary | ICD-10-CM

## 2021-03-09 DIAGNOSIS — I480 Paroxysmal atrial fibrillation: Secondary | ICD-10-CM

## 2021-03-09 DIAGNOSIS — L409 Psoriasis, unspecified: Secondary | ICD-10-CM

## 2021-03-09 DIAGNOSIS — G20 Parkinson's disease: Secondary | ICD-10-CM | POA: Diagnosis not present

## 2021-03-09 DIAGNOSIS — I10 Essential (primary) hypertension: Secondary | ICD-10-CM

## 2021-03-09 NOTE — Progress Notes (Signed)
Location:  [place] of Service:  Clinic (12)  Provider:   Code Status:  Goals of Care:  Advanced Directives 03/08/2021  Does Patient Have a Medical Advance Directive? Yes  Type of Paramedic of [place];Living will;Out of facility DNR (pink MOST or yellow form)  Does patient want to make changes to medical advance directive? No - Patient declined  Copy of [place] in Chart? Yes - validated most recent copy scanned in chart (See row information)  Pre-existing out of facility DNR order (yellow form or pink MOST form) -     Chief Complaint  Patient presents with   Medical Management of Chronic Issues    Patient returns to the clinic for 3 month follow up. She would like to discuss a handicap placard, neurology referral, Psoriasis, carpal tunnel, and her hearing.      HPI: Patient is a 87 y.o. female seen today for medical management of chronic diseases.    Patient has a history of PAF on Eliquis History of TIA and stroke Essential hypertension, hyperlipidemia History of autoimmune orthostatic hypotension causing syncope Chronic constipation, history of urinary incontinence has failed Myrbetriq History of essential tremors per neurology  Her Acute issues Rash Diagnosed with Psoriasis. Little frustrated about this HOH. Wants to know if she should get Auditory implant. Not sure if they will work with her age Wants to see neurology for worsening tremors and she thinks she has Parkinson Has Onchomycosis of her Toe nails Want to know if she should continue Clotrimazole solution Does not wear her Splint for CTS but wearing gloves as needed Also Does not want to wear Ted hoses due to her rash. No falls or dizziness  Lives in AL No Nursing issues Walks well with her walker   Past Medical History:  Diagnosis Date   Allergic rhinitis    pollen and grass   Asthma    Atrial fibrillation ([place]) 05/13/2018    Atrial fibrillation ([place])    Blepharitis    Carpal tunnel syndrome, bilateral 07/03/2016   Family history of adverse reaction to anesthesia    " [place] (87YR OLD ) DIED UNDER ANESTHESTIA    High cholesterol    HOH (hard of hearing)    Hyperlipidemia    Irregular heart rhythm    Macular degeneration 2012   Mild cognitive impairment with memory loss 12/03/2019   Mild persistent asthma without complication 3/81/0175   Osteoporosis    Parkinsonism ([place]) 04/11/2017   Senile osteopenia 03/24/2015   Sensorineural hearing loss, bilateral 2007   Shingles 2004   Skin cancer 2005   nose   Syncope and collapse 12/11/2019   TIA (transient ischemic attack)    Torn meniscus 2006   right knee   Tremor 2006   familiar.  dx by Dr. Quillian Quince   Unspecified essential hypertension    Unspecified hypothyroidism    Urinary frequency     Past Surgical History:  Procedure Laterality Date   ABDOMINAL HYSTERECTOMY  1977   ABDOMINAL HYSTERECTOMY     CATARACT EXTRACTION  1974   Left, IOL implants   CATARACT EXTRACTION Right 1979   IOL implants   [place]   eye   [place]  [place]   SKIN CANCER EXCISION  2005   nose   THYROIDECTOMY  1955   TONSILLECTOMY  1931   TONSILLECTOMY      Allergies  Allergen Reactions   Clindamycin/Lincomycin Diarrhea    diarrhea   Clindamycin/Lincomycin     Listed on MAR -- reaction unknown   Grass Pollen(K-O-R-T-Swt Vern)     Listed on MAR -- reaction unknown   Ibuprofen Other (See Comments)    Long time ago/ list on paper work   Ibuprofen     Listed on MAR -- reaction unknown   Other     Preparation H Listed on MAR -- reaction unknown   Penicillins Other (See Comments)    Did it involve swelling of the face/tongue/throat, SOB, or low BP? Unknown Did it involve  sudden or severe rash/hives, skin peeling, or any reaction on the inside of your mouth or nose? Unknown Did you need to seek medical attention at a hospital or doctor's office? Unknown When did it last happen?       If all above answers are NO, may proceed with cephalosporin use.   Penicillins     Listed on MAR -- reaction unknown    Outpatient Encounter Medications as of 03/09/2021  Medication Sig   albuterol (VENTOLIN HFA) 108 (90 Base) MCG/ACT inhaler Inhale 2 puffs into the lungs every 6 (six) hours as needed for wheezing.   amLODipine (NORVASC) 5 MG tablet Take 1 tablet (5 mg total) by mouth daily.   azelastine (ASTELIN) 0.1 % nasal spray Place 1 spray into both nostrils 2 (two) times daily. Use in each nostril as directed   Cholecalciferol (VITAMIN D) 2000 units CAPS Take 1 capsule (2,000 Units total) by mouth daily.   clobetasol (TEMOVATE) 0.05 % external solution Apply 1 application topically as needed.   clotrimazole (LOTRIMIN) 1 % cream Apply 1 application topically 2 (two) times daily.   fluticasone (FLONASE) 50 MCG/ACT nasal spray Place 1 spray into both nostrils 2 (two) times daily.   loratadine (CLARITIN) 10 MG tablet Take 1 tablet (10 mg total) by mouth daily.   mineral oil-hydrophilic petrolatum (AQUAPHOR) ointment Apply 1 application topically 2 (two) times daily as needed for dry skin.   oxymetazoline (AFRIN) 0.05 % nasal spray Place 2 sprays into both nostrils 2 (two) times daily as needed for congestion.    acetaminophen (TYLENOL) 325 MG tablet Take 650 mg by mouth as needed.   apixaban (ELIQUIS) 2.5 MG TABS tablet Take 1 tablet (2.5 mg total) by mouth 2 (two) times daily.   BREO ELLIPTA 100-25 MCG/INH AEPB Inhale 1 puff into the lungs every evening.   calcium carbonate (TUMS - DOSED IN MG ELEMENTAL CALCIUM) 500 MG chewable tablet Chew 1 tablet by mouth daily as needed for indigestion.   diclofenac Sodium (VOLTAREN) 1 % GEL Apply topically as needed. MAR states: Dose:  "Small amount", Frequency: "As needed", Instructions: "May keep at bedside and self administer"   fluocinonide cream (LIDEX) 4.54 % Apply 1 application topically as needed.   HYDROcodone-acetaminophen (NORCO/VICODIN) 5-325 MG tablet Take 0.5 tablets by mouth 2 (two) times daily as needed for severe pain.   metoprolol succinate (TOPROL-XL) 25 MG 24 hr tablet Take 12.5 mg by mouth daily.   Multiple Vitamins-Minerals (PRESERVISION AREDS 2+MULTI VIT) CAPS Take 1 capsule by mouth 2 (two) times daily.   Polyethyl Glycol-Propyl Glycol (SYSTANE) 0.4-0.3 % SOLN Apply to eye.   pyridostigmine (MESTINON) 60 MG tablet Take 0.5 tablets (  30 mg total) by mouth daily.   senna-docusate (SENOKOT S) 8.6-50 MG tablet Take 2 tablets by mouth daily.   sertraline (ZOLOFT) 50 MG tablet Take 50 mg by mouth daily.   No facility-administered encounter medications on file as of 03/09/2021.    Review of Systems:  Review of Systems  Constitutional:  Negative for activity change and appetite change.  HENT: Negative.    Respiratory:  Negative for cough and shortness of breath.   Cardiovascular:  Positive for leg swelling.  Gastrointestinal:  Negative for constipation.  Genitourinary: Negative.   Musculoskeletal:  Positive for gait problem. Negative for arthralgias and myalgias.  Skin:  Positive for rash.  Neurological:  Negative for dizziness and weakness.  Psychiatric/Behavioral:  Negative for confusion, dysphoric mood and sleep disturbance.    Health Maintenance  Topic Date Due   COVID-19 Vaccine (4 - Booster for Moderna series) 02/16/2021   TETANUS/TDAP  04/07/2025   Pneumonia Vaccine 67+ Years old  Completed   INFLUENZA VACCINE  Completed   DEXA SCAN  Completed   Zoster Vaccines- Shingrix  Completed   HPV VACCINES  Aged Out    Physical Exam: Vitals:   03/09/21 1000  BP: 138/84  Pulse: 66  Temp: 97.7 F (36.5 C)  SpO2: 98%  Weight: 137 lb (62.1 kg)  Height: 5' (1.524 m)   Body mass index is 26.76  kg/m. Physical Exam Vitals reviewed.  Constitutional:      Appearance: Normal appearance.  HENT:     Head: Normocephalic.     Nose: Nose normal.     Mouth/Throat:     Mouth: Mucous membranes are moist.     Pharynx: Oropharynx is clear.  Eyes:     Pupils: Pupils are equal, round, and reactive to light.  Cardiovascular:     Rate and Rhythm: Normal rate. Rhythm irregular.     Pulses: Normal pulses.     Heart sounds: Normal heart sounds. No murmur heard. Pulmonary:     Effort: Pulmonary effort is normal.     Breath sounds: Normal breath sounds.  Abdominal:     General: Abdomen is flat. Bowel sounds are normal.     Palpations: Abdomen is soft.  Musculoskeletal:        General: Swelling present.     Cervical back: Neck supple.  Skin:    General: Skin is warm.  Neurological:     General: No focal deficit present.     Mental Status: She is alert and oriented to person, place, and time.  Psychiatric:        Mood and Affect: Mood normal.        Thought Content: Thought content normal.    Labs reviewed: Basic Metabolic Panel: Recent Labs    07/22/20 0000 07/22/20 1300 08/02/20 0000  NA  --  140 141  K  --  4.9 4.6  CL  --  104 103  CO2  --  27* 24*  BUN  --  16 15  CREATININE  --  0.8 0.8  CALCIUM 9.4  --  9.8  TSH  --  3.50  --    Liver Function Tests: Recent Labs    07/22/20 0000 07/22/20 1300 08/02/20 0000  AST  --  17 17  ALT  --  12 12  ALKPHOS  --  71 67  ALBUMIN 3.9  --  4.2   No results for input(s): LIPASE, AMYLASE in the last 8760 hours. No results for input(s): AMMONIA in  the last 8760 hours. CBC: Recent Labs    07/22/20 0000 08/02/20 0000  WBC 5.0 5.6  NEUTROABS 3.00  --   HGB 12.7 13.2  HCT 37 38  PLT 215 250   Lipid Panel: No results for input(s): CHOL, HDL, LDLCALC, TRIG, CHOLHDL, LDLDIRECT in the last 8760 hours. Lab Results  Component Value Date   HGBA1C 5.9 10/02/2017    Procedures since last visit: No results  found.  Assessment/Plan Parkinsonism, unspecified Parkinsonism type ([place]) - Plan: Ambulatory referral to Neurology Concerned that she has Parkinson. Per Neurology Essential tremors Made referal to Neurology for follow up Mild cognitive impairment with memory loss Continues to do well in AL setting Acquired hypothyroidism TSH normal in 5/22 Mild persistent asthma without complication Stable with Breo and Albuterol AF (paroxysmal atrial fibrillation) (HCC) On Eliquis and Toprol Essential hypertension BP is stable on Norvasc Passive control  Autonomic orthostatic hypotension On Mestinon Does not want to wear Ted hoses due to rash  Psoriasis Doing well with Lidex and Temovate Bilateral hearing loss, unspecified hearing loss type Not sure if qualify for any Implant She says she is going to discuss with them Constipation Will add Miralax in the morning Prn  Anxiety On Zoloft     Labs/tests ordered:  No Labs Next appt:  Visit date not found

## 2021-04-29 ENCOUNTER — Encounter: Payer: Self-pay | Admitting: Adult Health

## 2021-04-29 ENCOUNTER — Non-Acute Institutional Stay: Payer: Medicare Other | Admitting: Adult Health

## 2021-04-29 DIAGNOSIS — M5030 Other cervical disc degeneration, unspecified cervical region: Secondary | ICD-10-CM

## 2021-04-29 DIAGNOSIS — M542 Cervicalgia: Secondary | ICD-10-CM | POA: Diagnosis not present

## 2021-04-29 NOTE — Progress Notes (Signed)
Location:   [facility] [place] Room Number: 270 Place of Service:  ALF [phone]) Provider:  Royal Hawthorn, NP  Virgie Dad, MD  Patient Care Team: Virgie Dad, MD as PCP - General (Internal Medicine) Elouise Munroe, MD as PCP - Cardiology (Cardiology) [facility] Clance, Armando Reichert, MD as Consulting Physician (Pulmonary Disease) Verl Blalock, Marijo Conception, MD (Inactive) as Consulting Physician (Cardiology) Latanya Maudlin, MD as Consulting Physician (Orthopedic Surgery) Kathrynn Ducking, MD (Inactive) as Consulting Physician (Neurology) Renda Rolls, Jennefer Bravo, MD as Referring Physician (Dermatology) Gean Quint, MD (Inactive) as Consulting Physician (Allergy and Immunology) Shon Hough, MD as Consulting Physician (Ophthalmology) Paula Compton, MD as Consulting Physician (Obstetrics and Gynecology) Vicie Mutters, MD as Consulting Physician (Otolaryngology) Raeanne Gathers, AUD (Audiology) Carmel Sacramento, CCC-A (Audiology) Gayland Curry, DO (Geriatric Medicine) Elouise Munroe, MD (Cardiology)  Extended Emergency Contact Information Primary Emergency Contact: Cherlyn Labella Home Phone: [phone] Relation: Daughter Secondary Emergency Contact: Rossi,Lynn Address: [place] [address] of [place] Phone: [phone] Mobile Phone: [phone] Relation: Daughter  Code Status:  DNR Goals of care: Advanced Directive information Advanced Directives 04/29/2021  Does Patient Have a Medical Advance Directive? Yes  Type of Paramedic of [place];Living will;Out of facility DNR (pink MOST or yellow form)  Does patient want to make changes to medical advance directive? No - Patient declined  Copy of [place] in Chart? Yes - validated most recent copy scanned in chart (See row information)  Pre-existing out of facility DNR order  (yellow form or pink MOST form) -     Chief Complaint  Patient presents with   Acute Visit    neck pain     HPI:  Pt is a 88 y.o. female seen today for acute visit for neck pain. She reports she has had left sided neck pain for three days. No acute injury. No numbness or tingling that is new in her arms or hands. She has a chronic hx of CTS and uses braces. No issues breathing. Reports the pain as "livable".  Has tried tylenol with some relief. Has tried warm compresses. No rash reported.   Past Medical History:  Diagnosis Date   Allergic rhinitis    pollen and grass   Asthma    Atrial fibrillation ([place]) 05/13/2018   Atrial fibrillation ([place])    Blepharitis    Carpal tunnel syndrome, bilateral 07/03/2016   Family history of adverse reaction to anesthesia    " [place] (88YR OLD ) DIED UNDER ANESTHESTIA    High cholesterol    HOH (hard of hearing)    Hyperlipidemia    Irregular heart rhythm    Macular degeneration 2012   Mild cognitive impairment with memory loss 12/03/2019   Mild persistent asthma without complication 8/52/7782   Osteoporosis    Parkinsonism ([place]) 04/11/2017   Senile osteopenia 03/24/2015   Sensorineural hearing loss, bilateral 2007   Shingles 2004   Skin cancer 2005   nose   Syncope and collapse 12/11/2019   TIA (transient ischemic attack)    Torn meniscus 2006   right knee   Tremor 2006   familiar.  dx by Dr. Quillian Quince   Unspecified essential hypertension    Unspecified hypothyroidism    Urinary frequency    Past Surgical History:  Procedure Laterality Date   ABDOMINAL HYSTERECTOMY  1977  ABDOMINAL HYSTERECTOMY     CATARACT EXTRACTION  1974   Left, IOL implants   CATARACT EXTRACTION Right 1979   IOL implants   [place]   eye   [place]    SKIN CANCER EXCISION  2005   nose   THYROIDECTOMY  1955   TONSILLECTOMY  1931   TONSILLECTOMY      Allergies  Allergen Reactions   Clindamycin/Lincomycin Diarrhea    diarrhea   Clindamycin/Lincomycin     Listed on MAR -- reaction unknown   Grass Pollen(K-O-R-T-Swt Vern)     Listed on MAR -- reaction unknown   Ibuprofen Other (See Comments)    Long time ago/ list on paper work   Ibuprofen     Listed on MAR -- reaction unknown   Other     Preparation H Listed on MAR -- reaction unknown   Penicillins Other (See Comments)    Did it involve swelling of the face/tongue/throat, SOB, or low BP? Unknown Did it involve sudden or severe rash/hives, skin peeling, or any reaction on the inside of your mouth or nose? Unknown Did you need to seek medical attention at a hospital or doctor's office? Unknown When did it last happen?       If all above answers are NO, may proceed with cephalosporin use.   Penicillins     Listed on MAR -- reaction unknown    Allergies as of 04/29/2021       Reactions   Clindamycin/lincomycin Diarrhea   diarrhea   Clindamycin/lincomycin    Listed on MAR -- reaction unknown   Grass Pollen(k-o-r-t-swt Vern)    Listed on MAR -- reaction unknown   Ibuprofen Other (See Comments)   Long time ago/ list on paper work   Ibuprofen    Listed on MAR -- reaction unknown   Other    Preparation H Listed on MAR -- reaction unknown   Penicillins Other (See Comments)   Did it involve swelling of the face/tongue/throat, SOB, or low BP? Unknown Did it involve sudden or severe rash/hives, skin peeling, or any reaction on the inside of your mouth or nose? Unknown Did you need to seek medical attention at a hospital or doctor's office? Unknown When did it last happen?       If all above answers are NO, may proceed with cephalosporin use.   Penicillins    Listed on MAR -- reaction unknown        Medication List        Accurate as of April 29, 2021  9:45 AM.  If you have any questions, ask your nurse or doctor.          acetaminophen 325 MG tablet Commonly known as: TYLENOL Take 650 mg by mouth as needed.   albuterol 108 (90 Base) MCG/ACT inhaler Commonly known as: VENTOLIN HFA Inhale 2 puffs into the lungs every 6 (six) hours as needed for wheezing.   amLODipine 5 MG tablet Commonly known as: NORVASC Take 1 tablet (5 mg total) by mouth daily.   apixaban 2.5 MG Tabs tablet Commonly known as: ELIQUIS Take 1 tablet (2.5 mg total) by mouth 2 (two) times daily.   azelastine 0.1 % nasal spray Commonly known as: ASTELIN Place  1 spray into both nostrils 2 (two) times daily. Use in each nostril as directed   calcium carbonate 500 MG chewable tablet Commonly known as: TUMS - dosed in mg elemental calcium Chew 1 tablet by mouth daily as needed for indigestion.   clobetasol 0.05 % external solution Commonly known as: TEMOVATE Apply 1 application topically as needed.   clotrimazole 1 % cream Commonly known as: LOTRIMIN Apply 1 application topically 2 (two) times daily.   diclofenac Sodium 1 % Gel Commonly known as: VOLTAREN Apply topically as needed. MAR states: Dose: "Small amount", Frequency: "As needed", Instructions: "May keep at bedside and self administer"   fluocinonide cream 0.05 % Commonly known as: LIDEX Apply 1 application topically as needed.   fluticasone 50 MCG/ACT nasal spray Commonly known as: FLONASE Place 1 spray into both nostrils 2 (two) times daily.   fluticasone furoate-vilanterol 100-25 MCG/ACT Aepb Commonly known as: BREO ELLIPTA Inhale 1 puff into the lungs daily.   loratadine 10 MG tablet Commonly known as: CLARITIN Take 1 tablet (10 mg total) by mouth daily.   metoprolol succinate 25 MG 24 hr tablet Commonly known as: TOPROL-XL Take 12.5 mg by mouth daily.   mineral oil-hydrophilic petrolatum ointment Apply 1 application topically 2 (two) times daily as needed for dry skin.   oxymetazoline  0.05 % nasal spray Commonly known as: AFRIN Place 2 sprays into both nostrils 2 (two) times daily as needed for congestion.   polyethylene glycol 17 g packet Commonly known as: MIRALAX / GLYCOLAX Take 8.5 g by mouth daily.   PreserVision AREDS 2+Multi Vit Caps Take 1 capsule by mouth 2 (two) times daily.   pyridostigmine 60 MG tablet Commonly known as: MESTINON Take 0.5 tablets (30 mg total) by mouth daily.   senna-docusate 8.6-50 MG tablet Commonly known as: Senokot S Take 2 tablets by mouth daily.   sertraline 50 MG tablet Commonly known as: ZOLOFT Take 50 mg by mouth daily.   Systane 0.4-0.3 % Soln Generic drug: Polyethyl Glycol-Propyl Glycol Apply to eye.   Vitamin D 50 MCG (2000 UT) Caps Take 1 capsule (2,000 Units total) by mouth daily.        Review of Systems  Constitutional:  Negative for activity change, appetite change, chills, diaphoresis, fatigue, fever and unexpected weight change.  HENT:  Negative for congestion.   Respiratory:  Negative for cough, shortness of breath and wheezing.   Cardiovascular:  Negative for chest pain, palpitations and leg swelling.  Gastrointestinal:  Negative for abdominal distention, abdominal pain, constipation and diarrhea.  Genitourinary:  Negative for difficulty urinating and dysuria.  Musculoskeletal:  Positive for gait problem and neck pain. Negative for arthralgias, back pain, joint swelling, myalgias and neck stiffness.  Neurological:  Negative for dizziness, tremors, seizures, syncope, facial asymmetry, speech difficulty, weakness, light-headedness, numbness (hx of CTS) and headaches.  Psychiatric/Behavioral:  Negative for agitation, behavioral problems and confusion.    Immunization History  Administered Date(s) Administered   Influenza Whole 12/12/2011, 12/31/2012   Influenza, High Dose Seasonal PF 12/20/2018, 01/02/2020, 12/15/2020   Influenza,inj,Quad PF,6+ Mos 01/04/2018   Influenza-Unspecified 12/31/2014,  01/06/2016, 01/01/2017   Moderna SARS-COV2 Booster Vaccination 01/24/2020, 10/07/2020   Moderna Sars-Covid-2 Vaccination 03/23/2019, 04/22/2019, 12/22/2020   Pneumococcal Conjugate-13 07/14/2014   Pneumococcal Polysaccharide-23 03/13/2005   Tdap 04/08/2015   Zoster Recombinat (Shingrix) 03/29/2017, 05/24/2017   Zoster, Live 03/13/2006   Pertinent  Health Maintenance Due  Topic Date Due   INFLUENZA VACCINE  Completed   DEXA SCAN  Completed  Fall Risk 07/21/2020 09/06/2020 12/06/2020 01/20/2021 03/08/2021  Falls in the past year? 0 0 0 0 0  Was there an injury with Fall? - - - 0 0  Was there an injury with Fall? - - - - -  Fall Risk Category Calculator - - - 0 0  Fall Risk Category - - - Low Low  Patient Fall Risk Level - - Low fall risk Low fall risk Low fall risk  Patient at Risk for Falls Due to - - - No Fall Risks No Fall Risks  Fall risk Follow up - - Falls evaluation completed Falls evaluation completed Falls evaluation completed   Functional Status Survey:    Vitals:   04/29/21 0929  BP: (!) 156/77  Pulse: 60  Resp: (!) 22  Temp: (!) 97.3 F (36.3 C)  SpO2: 94%  Weight: 134 lb (60.8 kg)  Height: 5' 0.5" (1.537 m)   Body mass index is 25.74 kg/m. Physical Exam Neck:     Thyroid: No thyroid mass, thyromegaly or thyroid tenderness.     Vascular: No carotid bruit.     Trachea: Trachea normal.     Comments: Tight muscles at the trapezius left side Musculoskeletal:     Right shoulder: Normal.     Left shoulder: Normal.     Cervical back: No edema, erythema, signs of trauma, rigidity, torticollis or crepitus. Muscular tenderness present. No pain with movement or spinous process tenderness. Decreased range of motion.  Lymphadenopathy:     Cervical: No cervical adenopathy.    Labs reviewed: Recent Labs    07/22/20 0000 07/22/20 1300 08/02/20 0000 03/07/21 0000  NA  --  140 141 144  K  --  4.9 4.6 4.8  CL  --  104 103 109*  CO2  --  27* 24* 24*  BUN  --  16  15 20   CREATININE  --  0.8 0.8 0.7  CALCIUM 9.4  --  9.8 8.9   Recent Labs    07/22/20 0000 07/22/20 1300 08/02/20 0000  AST  --  17 17  ALT  --  12 12  ALKPHOS  --  71 67  ALBUMIN 3.9  --  4.2   Recent Labs    07/22/20 0000 08/02/20 0000 03/07/21 0000  WBC 5.0 5.6 4.7  NEUTROABS 3.00  --   --   HGB 12.7 13.2 11.9*  HCT 37 38 37  PLT 215 250 198   Lab Results  Component Value Date   TSH 3.50 07/22/2020   Lab Results  Component Value Date   HGBA1C 5.9 10/02/2017   Lab Results  Component Value Date   CHOL 211 (H) 05/14/2018   HDL 53 05/14/2018   LDLCALC 146 (H) 05/14/2018   TRIG 61 05/14/2018   CHOLHDL 4.0 05/14/2018    Significant Diagnostic Results in last 30 days:  No results found.  Assessment/Plan  1. Neck pain Likely due to degenerative changes, now with some muscle tightness Recommend voltaren massage into the neck area QID Neck stretches  Tylenol bid x 1 week (allergy to nsaids and is on DOAC) F/U if not improving   2. DDD (degenerative disc disease), cervical Reviewed CT of the cervical area 12/10/19 showing advanced cervical degenerative changes spanning C3-7   Family/ staff Communication:   Labs/tests ordered:     Total time 69min:  time greater than 50% of total time spent doing pt counseling and coordination of care

## 2021-05-18 ENCOUNTER — Encounter: Payer: Self-pay | Admitting: Neurology

## 2021-05-18 ENCOUNTER — Ambulatory Visit (INDEPENDENT_AMBULATORY_CARE_PROVIDER_SITE_OTHER): Payer: Medicare Other | Admitting: Neurology

## 2021-05-18 VITALS — BP 80/58 | HR 58 | Ht 60.0 in | Wt 138.0 lb

## 2021-05-18 DIAGNOSIS — G250 Essential tremor: Secondary | ICD-10-CM | POA: Diagnosis not present

## 2021-05-18 DIAGNOSIS — R001 Bradycardia, unspecified: Secondary | ICD-10-CM

## 2021-05-18 DIAGNOSIS — I9589 Other hypotension: Secondary | ICD-10-CM

## 2021-05-18 NOTE — Patient Instructions (Signed)
You have a longstanding history of hand tremors, your history and exam are in keeping with what we call essential tremor.  I would not recommend any new medications, please try to stay well-hydrated and use your walker at all times.  ?I do not see any signs or symptoms of parkinson's like disease or what we call parkinsonism.  ?We do not have to make a follow up appointment in this clinic routinely.  ? ?Please remember, that any kind of tremor may be exacerbated by anxiety, anger, nervousness, excitement, dehydration, sleep deprivation, by caffeine, thyroid disease, and low blood sugar values or blood sugar fluctuations. Some medications can exacerbate tremors.  ?Please follow-up with your primary care and other specialists as scheduled. ? ?

## 2021-05-18 NOTE — Progress Notes (Signed)
Subjective:    Patient ID: Christina Lawson is a 88 y.o. female.  HPI    Star Age, MD, PhD [facility] [address] [place], [place] [address]  Dear Dr. Lyndel Safe,   I saw your patient, Christina Lawson, upon your kind request in my neurologic clinic today for evaluation of her tremor disorder, concern for parkinsonism.  The patient is unaccompanied today.  As you know, Christina Lawson is a 88 year old right-handed woman with an underlying complex medical history of hypertension, hyperlipidemia, paroxysmal A-fib, macular degeneration, allergic rhinitis, asthma, memory loss, history of TIA, history of syncope and orthostatic hypotension, hypothyroidism, chronic constipation, and bladder incontinence, who reports a longstanding history of bilateral hand tremors, of over 5 years, maybe not as long as 10 years but she does not know exactly how many years.  She has a family history of tremors, she believes her dad had a hand tremor in his later years.  She has no family history of Parkinson's disease.  She has not fallen recently but has fallen in the past.  She uses a 4 wheeled walker.  She drinks caffeine in the form of coffee, 1 cup of regular coffee in the morning and 1 cup of decaf coffee with lunch, she may not always hydrate well with water by her estimate.   I reviewed your office note from 03/09/2021.  She had previously followed with Dr. Jannifer Franklin and [name], NP and I reviewed office visit notes from the past 3 years.  I copied the notes below for reference.  She had a telephone visit with Judson Roch on 08/03/2020.  She has taken alprazolam as needed for tremor in the past.  She is currently on metoprolol per cardiology.  Of note, she is also on sertraline.   Previously:   <<08/03/2020 SS: Christina Lawson is a 88 year old female with history of essential tremor, resides in assisted living at [facility].  Claims the tremor is stable, in the right and the  left hand, now feels more in the right hand.  Notices with handwriting or eating.  She has not had any falls.  Zoloft is helping with her anxiety, is on metoprolol.  No longer on Xanax PRN for tremor.  Continues to do exercise classes.  Is not going to the pool.  Would like to be followed to ensure she does not develop Parkinson's disease.  Denies any major changes to her overall health.  Here today for evaluation via telephone visit.  Is on Eliquis for history of A. fib.>>   <<08/04/2019 SS: Christina Lawson 88 year old female with history of essential tremor.  The patient lives at [facility].  She is on metoprolol for A. Fib, and Eliquis.  Since last seen, she has moved into [place], she says, "I knew it was time", getting older and had a few falls.  She has good and bad days with a tremor, in both hands, left greater than right.  Is worse during times of anxiety or stress.  She is on Zoloft for anxiety.  She is right-handed, may have difficulty with handwriting and with feeding.  She has not had recent fall.  She remains active, the facility activity director does virtual exercise classes daily, walking is limited in [place] due to social distance restrictions.  She was going to the pool, but is now too far away for her.  Was previously on alprazolam as needed for tremor, but was stopped after she was hospitalized last year, wonders about going back  on as needed for tremor.  She presents today for evaluation unaccompanied.>>  07/31/18 (Dr Jannifer Franklin): <<Christina Lawson is a 88 year old right-handed white female with a history of an essential tremor.  The patient lives at [facility], she has recently been in the hospital with chest pressure.  She was admitted on 13 May 2018 after she felt weak and short winded.  She was found to have atrial fibrillation with rapid ventricular response.  Her troponin levels topped out over 7, the patient was not felt to have had a myocardial infarction, she was felt to have had some heart  strain related to the elevated heart rate.  The patient has been placed on metoprolol which concurrently has helped her tremor.  She has no longer taking alprazolam.  The patient notes that when she is under stress, the tremor is worse.  The patient does have some gait instability, she does not use a cane or a walker for ambulation.  She does have a cane, however.  She has not had any falls.  She is trying to stay active, she is exercising on a regular basis.  She reports no other new medical issues that have come up since last seen.  She is now on anticoagulation, off of aspirin. >>  Her Past Medical History Is Significant For: Past Medical History:  Diagnosis Date   Allergic rhinitis    pollen and grass   Asthma    Atrial fibrillation ([place]) 05/13/2018   Atrial fibrillation ([facility])    Blepharitis    Carpal tunnel syndrome, bilateral 07/03/2016   Family history of adverse reaction to anesthesia    " Raymond (88YR OLD ) DIED UNDER ANESTHESTIA    High cholesterol    HOH (hard of hearing)    Hyperlipidemia    Irregular heart rhythm    Macular degeneration 2012   Mild cognitive impairment with memory loss 12/03/2019   Mild persistent asthma without complication 5/78/4696   Osteoporosis    Parkinsonism ([place]) 04/11/2017   Senile osteopenia 03/24/2015   Sensorineural hearing loss, bilateral 2007   Shingles 2004   Skin cancer 2005   nose   Syncope and collapse 12/11/2019   TIA (transient ischemic attack)    Torn meniscus 2006   right knee   Tremor 2006   familiar.  dx by Dr. Quillian Quince   Unspecified essential hypertension    Unspecified hypothyroidism    Urinary frequency     Her Past Surgical History Is Significant For: Past Surgical History:  Procedure Laterality Date   ABDOMINAL HYSTERECTOMY  1977   ABDOMINAL HYSTERECTOMY     CATARACT EXTRACTION  1974   Left, IOL implants   CATARACT EXTRACTION Right 1979   IOL implants   [place]   eye   [place]   SKIN CANCER EXCISION  2005   nose   THYROIDECTOMY  1955   TONSILLECTOMY  1931   TONSILLECTOMY      Her Family History Is Significant For: Family History  Problem Relation Age of Onset   Brain cancer Mother    Cancer Mother        Brain   Hypertension Mother    Emphysema Father    Allergies Father    Asthma  Father    Cancer Sister        Cervical   Endometrial cancer Sister    Breast cancer Sister 11   Cancer Sister        Breast, metastasized to bone   Breast cancer Sister        unsuer of age   Breast cancer Maternal Aunt    Breast cancer Maternal Aunt    Breast cancer Paternal Aunt    Heart disease Maternal Grandfather    Breast cancer Daughter 58   Breast cancer Cousin    Breast cancer Cousin    Stroke Other    Tremor Neg Hx    Parkinson's disease Neg Hx     Her Social History Is Significant For: Social History   Socioeconomic History   Marital status: Single    Spouse name: Not on file   Number of children: 2   Years of education: college   Highest education level: Not on file  Occupational History   Occupation: retired Education officer, museum    Comment: retired  Tobacco Use   Smoking status: Never   Smokeless tobacco: Never  Scientific laboratory technician Use: Never used  Substance and Sexual Activity   Alcohol use: Not Currently    Comment: on occ.   Drug use: Never   Sexual activity: Not Currently  Other Topics Concern   Not on file  Social History Narrative   ** Merged History Encounter **       Lives at [facility]. Married 1958. Never smoked Alcohol minimal Exercise predominantly walking, water aerobic Retired Education officer, museum     Social Determinants of Radio broadcast assistant Strain: Not on file  Food Insecurity: Not on file  Transportation Needs: Not on file   Physical Activity: Not on file  Stress: Not on file  Social Connections: Not on file    Her Allergies Are:  Allergies  Allergen Reactions   Clindamycin/Lincomycin Diarrhea    diarrhea   Clindamycin/Lincomycin     Listed on MAR -- reaction unknown   Grass Pollen([id number] Vern)     Listed on MAR -- reaction unknown   Ibuprofen Other (See Comments)    Long time ago/ list on paper work   Ibuprofen     Listed on MAR -- reaction unknown   Other     Preparation H Listed on MAR -- reaction unknown   Penicillins Other (See Comments)    Did it involve swelling of the face/tongue/throat, SOB, or low BP? Unknown Did it involve sudden or severe rash/hives, skin peeling, or any reaction on the inside of your mouth or nose? Unknown Did you need to seek medical attention at a hospital or doctor's office? Unknown When did it last happen?       If all above answers are NO, may proceed with cephalosporin use.   Penicillins     Listed on MAR -- reaction unknown  :   Her Current Medications Are:  Outpatient Encounter Medications as of 05/18/2021  Medication Sig   acetaminophen (TYLENOL) 325 MG tablet Take 650 mg by mouth in the morning and at bedtime.   albuterol (VENTOLIN HFA) 108 (90 Base) MCG/ACT inhaler Inhale 2 puffs into the lungs every 6 (six) hours as needed for wheezing.   amLODipine (NORVASC) 5 MG tablet Take 1 tablet (5 mg total) by mouth daily.   apixaban (ELIQUIS) 2.5 MG TABS tablet Take 1 tablet (2.5 mg total) by mouth 2 (two) times  daily.   azelastine (ASTELIN) 0.1 % nasal spray Place 1 spray into both nostrils 2 (two) times daily. Use in each nostril as directed   calcium carbonate (TUMS - DOSED IN MG ELEMENTAL CALCIUM) 500 MG chewable tablet Chew 1 tablet by mouth daily as needed for indigestion.   Cholecalciferol (VITAMIN D) 2000 units CAPS Take 1 capsule (2,000 Units total) by mouth daily.   clobetasol (TEMOVATE) 0.05 % external solution Apply 1 application topically as  needed.   clotrimazole (LOTRIMIN) 1 % cream Apply 1 application topically 2 (two) times daily.   diclofenac Sodium (VOLTAREN) 1 % GEL Apply 2 g topically in the morning, at noon, in the evening, and at bedtime. MAR states: Dose: "Small amount", Frequency: "As needed", Instructions: "May keep at bedside and self administer"    fluocinonide cream (LIDEX) 3.47 % Apply 1 application topically as needed.   fluticasone (FLONASE) 50 MCG/ACT nasal spray Place 1 spray into both nostrils 2 (two) times daily.   fluticasone furoate-vilanterol (BREO ELLIPTA) 100-25 MCG/ACT AEPB Inhale 1 puff into the lungs daily.   loratadine (CLARITIN) 10 MG tablet Take 1 tablet (10 mg total) by mouth daily.   metoprolol succinate (TOPROL-XL) 25 MG 24 hr tablet Take 12.5 mg by mouth daily.   mineral oil-hydrophilic petrolatum (AQUAPHOR) ointment Apply 1 application topically 2 (two) times daily as needed for dry skin.   Multiple Vitamins-Minerals (PRESERVISION AREDS 2+MULTI VIT) CAPS Take 1 capsule by mouth 2 (two) times daily.   oxymetazoline (AFRIN) 0.05 % nasal spray Place 2 sprays into both nostrils 2 (two) times daily as needed for congestion.    Polyethyl Glycol-Propyl Glycol (SYSTANE) 0.4-0.3 % SOLN Apply to eye.   polyethylene glycol (MIRALAX / GLYCOLAX) 17 g packet Take 8.5 g by mouth daily.   pyridostigmine (MESTINON) 60 MG tablet Take 0.5 tablets (30 mg total) by mouth daily.   senna-docusate (SENOKOT S) 8.6-50 MG tablet Take 2 tablets by mouth daily.   sertraline (ZOLOFT) 50 MG tablet Take 50 mg by mouth daily.   No facility-administered encounter medications on file as of 05/18/2021.  :   Review of Systems:  Out of a complete 14 point review of systems, all are reviewed and negative with the exception of these symptoms as listed below:  Review of Systems  Neurological:        Pt is here for tremors and orthostatic hypotension . Pt states her tremors are in both hands . Pt sates her writing is terrible. Pt  states her BP drops at any given time and she has not fainted .    Objective:  Neurological Exam  Physical Exam Physical Examination:   Vitals:   05/18/21 1446  BP: (!) 80/58  Pulse: (!) 58    General Examination: The patient is a very pleasant 88 y.o. female in no acute distress. She appears frail, well-groomed.  She has some tachypnea and reports mild shortness of breath in the beginning of the exam but has no evidence of wheezing or stridor and her breathing and shortness of breath improve as we proceed.   Blood pressure is low with mild drop with orthostatic testing but not significant, mild bradycardia noted.  HEENT: Normocephalic, atraumatic, pupils are equal, round and reactive to light, extraocular tracking mildly impaired, corrective eyeglasses in place, excessive tearing both eyes.  No significant facial masking, mild hypophonia, no dysarthria, no nuchal rigidity, no lip, neck or jaw tremor, no head tremor.  No carotid bruits.  Airway examination reveals mild  mouth dryness.  Tongue protrudes centrally and palate elevates symmetrically.    Chest: Clear to auscultation without wheezing, rhonchi or crackles noted.  Heart: S1+S2+0, regular and normal without murmurs, rubs or gallops noted.   Abdomen: Soft, non-tender and non-distended.  Extremities: There is no pitting edema in the distal lower extremities bilaterally.   Skin: Warm and dry without trophic changes noted.   Musculoskeletal: exam reveals no obvious joint deformities, tenderness or joint swelling or erythema.   Neurologically:  Mental status: The patient is awake, alert and oriented in all 4 spheres. Her immediate and remote memory, attention, language skills and fund of knowledge are appropriate. There is no evidence of aphasia, agnosia, apraxia or anomia. Speech is clear with normal prosody and enunciation. Thought process is linear. Mood is normal and affect is normal.  Cranial nerves II - XII are as described  above under HEENT exam.  Motor exam: Mild, global strength of 4+ out of 5, no focal atrophy or weakness.  She has no resting tremor.  She has a mild bilateral upper extremity postural and action tremor, no lower extremity tremor or intention tremor.  Handwriting is legible, mildly tremulous, not micrographic, Archimedes spiral drawing shows coarse trembling with the right and left hand, worse with her nondominant hand.   Romberg is tested for safety concerns, reflexes are 1+ throughout.  Finger taps and hand movements as well as foot taps are mildly impaired but no decrement in amplitude is noted, no lateralization.  No rigidity in the extremities.    Cerebellar testing: No dysmetria or intention tremor. There is no truncal or gait ataxia.  Sensory exam: intact to light touch in the upper and lower extremities.  Gait, station and balance: She stands without major difficulty and does not require any assistance.  Posture is mildly stooped, but not appropriate for age.  She walks without shuffling, she uses a 4 wheeled walker and remembers the walker well.  No freezing or stutter steps.  Assessment and Plan:   In summary, Christina Lawson is a very pleasant 88 y.o.-year old female with an underlying complex medical history of hypertension, hyperlipidemia, paroxysmal A-fib, macular degeneration, allergic rhinitis, asthma, memory loss, history of TIA, history of syncope and orthostatic hypotension, hypothyroidism, chronic constipation, and bladder incontinence, who presents for evaluation of her tremor disorder.  She has a diagnosis of essential tremor, her history and examination are in keeping with essential tremor.  She has no evidence of parkinsonism and is reassured in that regard.  She does have hypotension and mild tachypnea today but her breathing and her subjective shortness of breath improve through the visit, she did have a mild drop in her systolic blood pressure of about 10 points.  She is  advised to change positions slowly, use her walker at all times and stay well-hydrated with water.  I would not recommend any new medications for her and she is already on metoprolol.  She is advised that she does not need routine follow-up in this clinic.  She is advised to follow-up with your office and her other specialists as scheduled.  I answered all her questions today and she was in agreement.   Thank you very much for allowing me to participate in the care of this nice patient. If I can be of any further assistance to you please do not hesitate to call me at [phone].  Sincerely,   Star Age, MD, PhD  I spent 40 minutes in total face-to-face time and in reviewing  records during pre-charting, more than 50% of which was spent in counseling and coordination of care, reviewing test results, reviewing medications and treatment regimen and/or in discussing or reviewing the diagnosis of ET, the prognosis and treatment options. Pertinent laboratory and imaging test results that were available during this visit with the patient were reviewed by me and considered in my medical decision making (see chart for details).

## 2021-06-01 NOTE — Progress Notes (Signed)
?Cardiology Clinic Note  ? ?Patient Name: Christina Lawson ?Date of Encounter: 06/03/2021 ? ?Primary Care Provider:  Virgie Dad, MD ?Primary Cardiologist:  Elouise Munroe, MD ? ?Patient Profile  ?  ?Mrs. Christina Lawson is a 88 year old female patient with history of paroxysmal atrial fibrillation, on Eliquis (CHA2DS2-VASc score of 6 (hypertension, female, age x2, prior TIA (2 points)).hypertension, hypothyroidism, tremor, hyperlipidemia, prior TIA with hospitalization on 12/31/2019 for syncopal episode and found to have significant orthostatic hypotension.  She was started on Mestinon and has been tolerating it well.  She was last seen in the office by Dr.Acharya on 12/02/2020.  ? ? ?Past Medical History  ?  ?Past Medical History:  ?Diagnosis Date  ? Allergic rhinitis   ? pollen and grass  ? Asthma   ? Atrial fibrillation ([place]) 05/13/2018  ? Atrial fibrillation ([place])   ? Blepharitis   ? Carpal tunnel syndrome, bilateral 07/03/2016  ? Family history of adverse reaction to anesthesia   ? " [place]   ? High cholesterol   ? HOH (hard of hearing)   ? Hyperlipidemia   ? Irregular heart rhythm   ? Macular degeneration 2012  ? Mild cognitive impairment with memory loss 12/03/2019  ? Mild persistent asthma without complication 9/44/9675  ? Osteoporosis   ? Parkinsonism ([place]) 04/11/2017  ? Senile osteopenia 03/24/2015  ? Sensorineural hearing loss, bilateral 2007  ? Shingles 2004  ? Skin cancer 2005  ? nose  ? Syncope and collapse 12/11/2019  ? TIA (transient ischemic attack)   ? Torn meniscus 2006  ? right knee  ? Tremor 2006  ? familiar.  dx by Dr. Quillian Quince  ? Unspecified essential hypertension   ? Unspecified hypothyroidism   ? Urinary frequency   ? ?Past Surgical History:  ?Procedure Laterality Date  ? ABDOMINAL HYSTERECTOMY  1977  ? ABDOMINAL HYSTERECTOMY    ? CATARACT EXTRACTION  1974  ? Left, IOL implants  ? CATARACT EXTRACTION Right 1979  ? IOL implants  ?  CHOLECYSTECTOMY  1981  ? CHOLECYSTECTOMY    ? COSMETIC SURGERY  1995  ? eye  ? [place]  ? [place] OF UTERUS  1977  ? EYE SURGERY    ? GALLBLADDER SURGERY  1981  ? HIATAL HERNIA REPAIR  1977  ? SKIN CANCER EXCISION  2005  ? nose  ? THYROIDECTOMY  1955  ? TONSILLECTOMY  1931  ? TONSILLECTOMY    ? ? ?Allergies ? ?Allergies  ?Allergen Reactions  ? Clindamycin/Lincomycin Diarrhea  ?  diarrhea  ? Clindamycin/Lincomycin   ?  Listed on MAR -- reaction unknown  ? Grass Pollen(K-O-R-T-Swt Vern)   ?  Listed on MAR -- reaction unknown  ? Ibuprofen Other (See Comments)  ?  Long time ago/ list on paper work  ? Ibuprofen   ?  Listed on MAR -- reaction unknown  ? Other   ?  Preparation H ?Listed on MAR -- reaction unknown  ? Penicillins Other (See Comments)  ?  Did it involve swelling of the face/tongue/throat, SOB, or low BP? Unknown ?Did it involve sudden or severe rash/hives, skin peeling, or any reaction on the inside of your mouth or nose? Unknown ?Did you need to seek medical attention at a hospital or doctor's office? Unknown ?When did it last happen?       ?If all above answers are ?NO?, may proceed with cephalosporin use.  ? Penicillins   ?  Listed on MAR -- reaction unknown  ? ? ?History of Present Illness  ?  ?[name] returns to the office today for follow-up with history of hypertension, paroxysmal atrial fibrillation, history of TIA, on Eliquis 2.5 mg twice daily for CHA2DS2-VASc score of 6, and hypercholesterolemia.  Is [facility] by [facility] neurology for essential tremors.  She was to return to neurology as needed.  She comes today without any cardiac complaints.  She is at an assisted living facility.  She is being followed there by Dr. Lyndel Safe who also does labs.  She is complaining of some bilateral lower extremity psoriasis for which she has seen a dermatologist, and is planning a follow-up appointment.  She is medically compliant and no intolerances to medications ? ?Home  Medications  ?  ?Current Outpatient Medications  ?Medication Sig Dispense Refill  ? acetaminophen (TYLENOL) 325 MG tablet Take 650 mg by mouth in the morning and at bedtime.    ? albuterol (VENTOLIN HFA) 108 (90 Base) MCG/ACT inhaler Inhale 2 puffs into the lungs every 6 (six) hours as needed for wheezing. 18 g 1  ? amLODipine (NORVASC) 5 MG tablet Take 1 tablet (5 mg total) by mouth daily. 30 tablet 0  ? apixaban (ELIQUIS) 2.5 MG TABS tablet Take 1 tablet (2.5 mg total) by mouth 2 (two) times daily. 60 tablet 11  ? azelastine (ASTELIN) 0.1 % nasal spray Place 1 spray into both nostrils 2 (two) times daily. Use in each nostril as directed 30 mL 5  ? calcium carbonate (TUMS - DOSED IN MG ELEMENTAL CALCIUM) 500 MG chewable tablet Chew 1 tablet by mouth daily as needed for indigestion.    ? Cholecalciferol (VITAMIN D) 2000 units CAPS Take 1 capsule (2,000 Units total) by mouth daily. 30 capsule 3  ? clobetasol (TEMOVATE) 0.05 % external solution Apply 1 application topically as needed.    ? clotrimazole (LOTRIMIN) 1 % cream Apply 1 application topically 2 (two) times daily.    ? diclofenac Sodium (VOLTAREN) 1 % GEL Apply 2 g topically in the morning, at noon, in the evening, and at bedtime. MAR states: Dose: "Small amount", Frequency: "As needed", Instructions: "May keep at bedside and self administer"     ? fluocinonide cream (LIDEX) 9.24 % Apply 1 application topically as needed.    ? fluticasone (FLONASE) 50 MCG/ACT nasal spray Place 1 spray into both nostrils 2 (two) times daily.    ? fluticasone furoate-vilanterol (BREO ELLIPTA) 100-25 MCG/ACT AEPB Inhale 1 puff into the lungs daily.    ? loratadine (CLARITIN) 10 MG tablet Take 1 tablet (10 mg total) by mouth daily. 30 tablet 0  ? metoprolol succinate (TOPROL-XL) 25 MG 24 hr tablet Take 12.5 mg by mouth daily.    ? mineral oil-hydrophilic petrolatum (AQUAPHOR) ointment Apply 1 application topically 2 (two) times daily as needed for dry skin.    ? Multiple  Vitamins-Minerals (PRESERVISION AREDS 2+MULTI VIT) CAPS Take 1 capsule by mouth 2 (two) times daily.    ? oxymetazoline (AFRIN) 0.05 % nasal spray Place 2 sprays into both nostrils 2 (two) times daily as needed for congestion.     ? Polyethyl Glycol-Propyl Glycol (SYSTANE) 0.4-0.3 % SOLN Apply to eye.    ? polyethylene glycol (MIRALAX / GLYCOLAX) 17 g packet Take 8.5 g by mouth daily.    ? pyridostigmine (MESTINON) 60 MG tablet Take 0.5 tablets (30 mg total) by mouth daily. 30 tablet 0  ? senna-docusate (SENOKOT S) 8.6-50 MG tablet Take 2  tablets by mouth daily. 60 tablet 3  ? sertraline (ZOLOFT) 50 MG tablet Take 50 mg by mouth daily.    ? ?No current facility-administered medications for this visit.  ?  ? ?Family History  ?  ?Family History  ?Problem Relation Age of Onset  ? Brain cancer Mother   ? Cancer Mother   ?     Brain  ? Hypertension Mother   ? Emphysema Father   ? Allergies Father   ? Asthma Father   ? Cancer Sister   ?     Cervical  ? Endometrial cancer Sister   ? Breast cancer Sister 31  ? Cancer Sister   ?     Breast, metastasized to bone  ? Breast cancer Sister   ?     unsuer of age  ? Breast cancer Maternal Aunt   ? Breast cancer Maternal Aunt   ? Breast cancer Paternal Aunt   ? Heart disease Maternal Grandfather   ? Breast cancer Daughter 23  ? Breast cancer Cousin   ? Breast cancer Cousin   ? Stroke Other   ? Tremor Neg Hx   ? Parkinson's disease Neg Hx   ? ?She indicated that her mother is deceased. She indicated that her father is deceased. She indicated that both of her sisters are deceased. She indicated that the status of her maternal grandfather is unknown. She indicated that both of her daughters are alive. She indicated that the status of her paternal aunt is unknown. She indicated that the status of her neg hx is unknown. She indicated that the status of her other is unknown. ? ?Social History  ?  ?Social History  ? ?Socioeconomic History  ? Marital status: Single  ?  Spouse name: Not on  file  ? Number of children: 2  ? Years of education: college  ? Highest education level: Not on file  ?Occupational History  ? Occupation: retired Education officer, museum  ?  Comment: retired  ?Tobacco Use  ? Smoking status: Never  ? S

## 2021-06-03 ENCOUNTER — Other Ambulatory Visit: Payer: Self-pay

## 2021-06-03 ENCOUNTER — Ambulatory Visit (INDEPENDENT_AMBULATORY_CARE_PROVIDER_SITE_OTHER): Payer: Medicare Other | Admitting: Adult Health

## 2021-06-03 VITALS — BP 130/72 | HR 66 | Ht 60.0 in | Wt 136.2 lb

## 2021-06-03 DIAGNOSIS — I10 Essential (primary) hypertension: Secondary | ICD-10-CM | POA: Diagnosis not present

## 2021-06-03 DIAGNOSIS — I480 Paroxysmal atrial fibrillation: Secondary | ICD-10-CM | POA: Diagnosis not present

## 2021-06-03 DIAGNOSIS — L409 Psoriasis, unspecified: Secondary | ICD-10-CM

## 2021-06-03 NOTE — Patient Instructions (Signed)
Medication Instructions:  ?No Changes ?*If you need a refill on your cardiac medications before your next appointment, please call your pharmacy* ? ? ?Lab Work: ?No Labs ?If you have labs (blood work) drawn today and your tests are completely normal, you will receive your results only by: ?MyChart Message (if you have MyChart) OR ?A paper copy in the mail ?If you have any lab test that is abnormal or we need to change your treatment, we will call you to review the results. ? ? ?Testing/Procedures: ?No Testing ? ? ?Follow-Up: ?At [facility], you and your health needs are our priority.  As part of our continuing mission to provide you with exceptional heart care, we have created designated Provider Care Teams.  These Care Teams include your primary Cardiologist (physician) and Advanced Practice Providers (APPs -  Physician Assistants and Nurse Practitioners) who all work together to provide you with the care you need, when you need it. ? ?We recommend signing up for the patient portal called "MyChart".  Sign up information is provided on this After Visit Summary.  MyChart is used to connect with patients for Virtual Visits (Telemedicine).  Patients are able to view lab/test results, encounter notes, upcoming appointments, etc.  Non-urgent messages can be sent to your provider as well.   ?To learn more about what you can do with MyChart, go to [email or web address].   ? ?Your next appointment:   ?6 month(s) ? ?The format for your next appointment:   ?In Person ? ?Provider:   ?Elouise Munroe, MD   ? ? ?  ?

## 2021-06-06 DIAGNOSIS — H903 Sensorineural hearing loss, bilateral: Secondary | ICD-10-CM | POA: Diagnosis not present

## 2021-06-29 DIAGNOSIS — L400 Psoriasis vulgaris: Secondary | ICD-10-CM | POA: Diagnosis not present

## 2021-06-29 DIAGNOSIS — L821 Other seborrheic keratosis: Secondary | ICD-10-CM | POA: Diagnosis not present

## 2021-07-04 DIAGNOSIS — H02105 Unspecified ectropion of left lower eyelid: Secondary | ICD-10-CM | POA: Diagnosis not present

## 2021-07-04 DIAGNOSIS — H52203 Unspecified astigmatism, bilateral: Secondary | ICD-10-CM | POA: Diagnosis not present

## 2021-07-04 DIAGNOSIS — H02102 Unspecified ectropion of right lower eyelid: Secondary | ICD-10-CM | POA: Diagnosis not present

## 2021-07-05 ENCOUNTER — Other Ambulatory Visit: Payer: Self-pay | Admitting: Orthopedic Surgery

## 2021-07-05 DIAGNOSIS — F419 Anxiety disorder, unspecified: Secondary | ICD-10-CM

## 2021-07-05 MED ORDER — ALPRAZOLAM 0.25 MG PO TABS: 0.2500 mg | ORAL_TABLET | Freq: Two times a day (BID) | ORAL | 0 refills | Status: AC | PRN

## 2021-07-15 ENCOUNTER — Non-Acute Institutional Stay: Payer: Medicare Other | Admitting: Adult Health

## 2021-07-15 ENCOUNTER — Encounter: Payer: Self-pay | Admitting: Adult Health

## 2021-07-15 DIAGNOSIS — S81802A Unspecified open wound, left lower leg, initial encounter: Secondary | ICD-10-CM | POA: Diagnosis not present

## 2021-07-15 DIAGNOSIS — L03116 Cellulitis of left lower limb: Secondary | ICD-10-CM | POA: Diagnosis not present

## 2021-07-15 MED ORDER — DOXYCYCLINE HYCLATE 100 MG PO TABS: 100.0000 mg | ORAL_TABLET | Freq: Two times a day (BID) | ORAL | 0 refills | Status: DC

## 2021-07-15 NOTE — Progress Notes (Signed)
?Location:   [place] ?Nursing Home Room Number: 161 ?Place of Service:  ALF (13) ?Provider:  Royal Hawthorn, NP  ? ?Virgie Dad, MD ? ?Patient Care Team: ?Virgie Dad, MD as PCP - General (Internal Medicine) ?Elouise Munroe, MD as PCP - Cardiology (Cardiology) ?[facility] ?Kathee Delton, MD as Consulting Physician (Pulmonary Disease) ?Wall, Marijo Conception, MD (Inactive) as Consulting Physician (Cardiology) ?Latanya Maudlin, MD as Consulting Physician (Orthopedic Surgery) ?Kathrynn Ducking, MD (Inactive) as Consulting Physician (Neurology) ?Haverstock, Jennefer Bravo, MD as Referring Physician (Dermatology) ?Gean Quint, MD (Inactive) as Consulting Physician (Allergy and Immunology) ?Shon Hough, MD as Consulting Physician (Ophthalmology) ?Paula Compton, MD as Consulting Physician (Obstetrics and Gynecology) ?Vicie Mutters, MD as Consulting Physician (Otolaryngology) ?Raeanne Gathers, AUD (Audiology) ?Carmel Sacramento, CCC-A (Audiology) ?Gayland Curry, DO (Geriatric Medicine) ?Elouise Munroe, MD (Cardiology) ? ?Extended Emergency Contact Information ?Primary Emergency Contact: Cherlyn Labella ?Home Phone: [phone] ?Relation: Daughter ?Secondary Emergency Contact: Rossi,Lynn ?Address: [place] ?         [place] ?Home Phone: [phone] ?Mobile Phone: [phone] ?Relation: Daughter ? ?Code Status:  DNR ?Goals of care: Advanced Directive information ? ?  07/15/2021  ?  8:54 AM  ?Advanced Directives  ?Does Patient Have a Medical Advance Directive? Yes  ?Type of Advance Directive Living will;Out of facility DNR (pink MOST or yellow form)  ?Does patient want to make changes to medical advance directive? No - Patient declined  ?Pre-existing out of facility DNR order (yellow form or pink MOST form) Yellow form placed in chart (order not valid for inpatient use);Pink MOST form placed in chart (order not  valid for inpatient use)  ? ? ? ?Chief Complaint  ?Patient presents with  ? Acute Visit  ?  Left lower ext redness  ? ? ?HPI:  ?Pt is a 88 y.o. female seen today for an acute visit for left lower ext redness. Redness noted on 5/4 and nurse wrote SBAR. Area is also warm and swollen. Resident has a hx of psoriasis with rash to legs and arms that is itchy. She started using amlactin but thinks the redness on the leg started before that. She has used the cream on her arms with no side effect. She has a skin tear on the left leg she obtained after hitting the leg on a chair. She does not have a fever, is not in pain, and does not have any change in her appetite or functional status.  ? ?Past Medical History:  ?Diagnosis Date  ? Allergic rhinitis   ? pollen and grass  ? Asthma   ? Atrial fibrillation ([place]) 05/13/2018  ? Atrial fibrillation ([place])   ? Blepharitis   ? Carpal tunnel syndrome, bilateral 07/03/2016  ? Family history of adverse reaction to anesthesia   ? " [place]   ? High cholesterol   ? HOH (hard of hearing)   ? Hyperlipidemia   ? Irregular heart rhythm   ? Macular degeneration 2012  ? Mild cognitive impairment with memory loss 12/03/2019  ? Mild persistent asthma without complication 4/69/6295  ? Osteoporosis   ? Parkinsonism ([place]) 04/11/2017  ? Senile osteopenia 03/24/2015  ? Sensorineural hearing loss, bilateral 2007  ? Shingles 2004  ? Skin cancer 2005  ? nose  ? Syncope and collapse 12/11/2019  ? TIA (transient ischemic attack)   ? Torn meniscus 2006  ?  right knee  ? Tremor 2006  ? familiar.  dx by Dr. Quillian Quince  ? Unspecified essential hypertension   ? Unspecified hypothyroidism   ? Urinary frequency   ? ?Past Surgical History:  ?Procedure Laterality Date  ? ABDOMINAL HYSTERECTOMY  1977  ? ABDOMINAL HYSTERECTOMY    ? CATARACT EXTRACTION  1974  ? Left, IOL implants  ? CATARACT EXTRACTION Right 1979  ? IOL implants  ? CHOLECYSTECTOMY  1981  ? CHOLECYSTECTOMY     ? COSMETIC SURGERY  1995  ? eye  ? [place]  ? [place] OF UTERUS  1977  ? EYE SURGERY    ? GALLBLADDER SURGERY  1981  ? HIATAL HERNIA REPAIR  1977  ? SKIN CANCER EXCISION  2005  ? nose  ? THYROIDECTOMY  1955  ? TONSILLECTOMY  1931  ? TONSILLECTOMY    ? ? ?Allergies  ?Allergen Reactions  ? Clindamycin/Lincomycin Diarrhea  ?  diarrhea  ? Clindamycin/Lincomycin   ?  Listed on MAR -- reaction unknown  ? Grass Pollen(K-O-R-T-Swt Vern)   ?  Listed on MAR -- reaction unknown  ? Ibuprofen Other (See Comments)  ?  Long time ago/ list on paper work  ? Ibuprofen   ?  Listed on MAR -- reaction unknown  ? Other   ?  Preparation H ?Listed on MAR -- reaction unknown  ? Penicillins Other (See Comments)  ?  Did it involve swelling of the face/tongue/throat, SOB, or low BP? Unknown ?Did it involve sudden or severe rash/hives, skin peeling, or any reaction on the inside of your mouth or nose? Unknown ?Did you need to seek medical attention at a hospital or doctor's office? Unknown ?When did it last happen?       ?If all above answers are ?NO?, may proceed with cephalosporin use.  ? Penicillins   ?  Listed on MAR -- reaction unknown  ? ? ?Allergies as of 07/15/2021   ? ?   Reactions  ? Clindamycin/lincomycin Diarrhea  ? diarrhea  ? Clindamycin/lincomycin   ? Listed on MAR -- reaction unknown  ? Grass Pollen(k-o-r-t-swt Vern)   ? Listed on MAR -- reaction unknown  ? Ibuprofen Other (See Comments)  ? Long time ago/ list on paper work  ? Ibuprofen   ? Listed on MAR -- reaction unknown  ? Other   ? Preparation H ?Listed on MAR -- reaction unknown  ? Penicillins Other (See Comments)  ? Did it involve swelling of the face/tongue/throat, SOB, or low BP? Unknown ?Did it involve sudden or severe rash/hives, skin peeling, or any reaction on the inside of your mouth or nose? Unknown ?Did you need to seek medical attention at a hospital or doctor's office? Unknown ?When did it last happen?       ?If all above answers  are ?NO?, may proceed with cephalosporin use.  ? Penicillins   ? Listed on MAR -- reaction unknown  ? ?  ? ?  ?Medication List  ?  ? ?  ? Accurate as of Jul 15, 2021 10:40 AM. If you have any questions, ask your nurse or doctor.  ?  ?  ? ?  ? ?STOP taking these medications   ? ?fluocinonide cream 0.05 % ?Commonly known as: LIDEX ?Stopped by: Royal Hawthorn, NP ?  ? ?  ? ?TAKE these medications   ? ?acetaminophen 325 MG tablet ?Commonly known as: TYLENOL ?Take 650 mg by mouth in the morning and at  bedtime. ?  ?albuterol 108 (90 Base) MCG/ACT inhaler ?Commonly known as: VENTOLIN HFA ?Inhale 2 puffs into the lungs every 6 (six) hours as needed for wheezing. ?  ?amLODipine 5 MG tablet ?Commonly known as: NORVASC ?Take 1 tablet (5 mg total) by mouth daily. ?  ?ammonium lactate 12 % lotion ?Commonly known as: LAC-HYDRIN ?Apply 1 application. topically every morning. ?  ?apixaban 2.5 MG Tabs tablet ?Commonly known as: ELIQUIS ?Take 1 tablet (2.5 mg total) by mouth 2 (two) times daily. ?  ?azelastine 0.1 % nasal spray ?Commonly known as: ASTELIN ?Place 1 spray into both nostrils 2 (two) times daily. Use in each nostril as directed ?  ?calcium carbonate 500 MG chewable tablet ?Commonly known as: TUMS - dosed in mg elemental calcium ?Chew 1 tablet by mouth daily as needed for indigestion. ?  ?cetirizine 10 MG tablet ?Commonly known as: ZYRTEC ?Take 10 mg by mouth as needed for allergies. ?  ?clobetasol 0.05 % external solution ?Commonly known as: TEMOVATE ?Apply 1 application topically as needed. ?  ?clotrimazole 1 % cream ?Commonly known as: LOTRIMIN ?Apply 1 application topically 2 (two) times daily. ?  ?diclofenac Sodium 1 % Gel ?Commonly known as: VOLTAREN ?Apply 2 g topically in the morning, at noon, in the evening, and at bedtime. MAR states: Dose: "Small amount", Frequency: "As needed", Instructions: "May keep at bedside and self administer" ?  ?fluticasone 50 MCG/ACT nasal spray ?Commonly known as: FLONASE ?Place 1  spray into both nostrils 2 (two) times daily. ?  ?fluticasone furoate-vilanterol 100-25 MCG/ACT Aepb ?Commonly known as: BREO ELLIPTA ?Inhale 1 puff into the lungs daily. ?  ?loratadine 10 MG tablet ?Com

## 2021-07-25 ENCOUNTER — Non-Acute Institutional Stay: Payer: Medicare Other | Admitting: Adult Health

## 2021-07-25 ENCOUNTER — Encounter: Payer: Self-pay | Admitting: Adult Health

## 2021-07-25 DIAGNOSIS — L03116 Cellulitis of left lower limb: Secondary | ICD-10-CM | POA: Diagnosis not present

## 2021-07-25 MED ORDER — DOXYCYCLINE HYCLATE 100 MG PO TABS
100.0000 mg | ORAL_TABLET | Freq: Two times a day (BID) | ORAL | 0 refills | Status: DC
Start: 2021-07-25 — End: 2021-12-22

## 2021-07-25 NOTE — Progress Notes (Signed)
?Location:   [place] ?  ?Place of Service:  ALF (13) ?Provider:  Royal Hawthorn, NP  ? ?Virgie Dad, MD ? ?Patient Care Team: ?Virgie Dad, MD as PCP - General (Internal Medicine) ?Elouise Munroe, MD as PCP - Cardiology (Cardiology) ?[facility] ?Kathee Delton, MD as Consulting Physician (Pulmonary Disease) ?Wall, Marijo Conception, MD (Inactive) as Consulting Physician (Cardiology) ?Latanya Maudlin, MD as Consulting Physician (Orthopedic Surgery) ?Kathrynn Ducking, MD (Inactive) as Consulting Physician (Neurology) ?Haverstock, Jennefer Bravo, MD as Referring Physician (Dermatology) ?Gean Quint, MD (Inactive) as Consulting Physician (Allergy and Immunology) ?Shon Hough, MD as Consulting Physician (Ophthalmology) ?Paula Compton, MD as Consulting Physician (Obstetrics and Gynecology) ?Vicie Mutters, MD as Consulting Physician (Otolaryngology) ?Raeanne Gathers, AUD (Audiology) ?Carmel Sacramento, CCC-A (Audiology) ?Gayland Curry, DO (Geriatric Medicine) ?Elouise Munroe, MD (Cardiology) ? ?Extended Emergency Contact Information ?Primary Emergency Contact: Cherlyn Labella ?Home Phone: [phone] ?Relation: Daughter ?Secondary Emergency Contact: Rossi,Lynn ?Address: [place] ?         [place] ?Home Phone: [phone] ?Mobile Phone: [phone] ?Relation: Daughter ? ?Code Status:  DNR ?Goals of care: Advanced Directive information ? ?  07/15/2021  ?  8:54 AM  ?Advanced Directives  ?Does Patient Have a Medical Advance Directive? Yes  ?Type of Advance Directive Living will;Out of facility DNR (pink MOST or yellow form)  ?Does patient want to make changes to medical advance directive? No - Patient declined  ?Pre-existing out of facility DNR order (yellow form or pink MOST form) Yellow form placed in chart (order not valid for inpatient use);Pink MOST form placed in chart (order not valid for inpatient use)   ? ? ? ?Chief Complaint  ?Patient presents with  ? Acute Visit  ?  Cellulitis ?  ? ? ?HPI:  ?Pt is a 88 y.o. female seen today for an acute visit for left lower ext cellulitis. She was placed on doxycyline 5/5 for 7 days for cellulitis. The area is still edematous and red but has improved. She is not having any fever, chills, or red streaking. Eating and drinking well. No pain.  ?Past Medical History:  ?Diagnosis Date  ? Allergic rhinitis   ? pollen and grass  ? Asthma   ? Atrial fibrillation ([place]) 05/13/2018  ? Atrial fibrillation ([place])   ? Blepharitis   ? Carpal tunnel syndrome, bilateral 07/03/2016  ? Family history of adverse reaction to anesthesia   ? " [place] [facility] [place]   ? High cholesterol   ? HOH (hard of hearing)   ? Hyperlipidemia   ? Irregular heart rhythm   ? Macular degeneration 2012  ? Mild cognitive impairment with memory loss 12/03/2019  ? Mild persistent asthma without complication 0/12/2723  ? Osteoporosis   ? Parkinsonism ([place]) 04/11/2017  ? Senile osteopenia 03/24/2015  ? Sensorineural hearing loss, bilateral 2007  ? Shingles 2004  ? Skin cancer 2005  ? nose  ? Syncope and collapse 12/11/2019  ? TIA (transient ischemic attack)   ? Torn meniscus 2006  ? right knee  ? Tremor 2006  ? familiar.  dx by Dr. Quillian Quince  ? Unspecified essential hypertension   ? Unspecified hypothyroidism   ? Urinary frequency   ? ?Past Surgical History:  ?Procedure Laterality Date  ? ABDOMINAL HYSTERECTOMY  1977  ? ABDOMINAL HYSTERECTOMY    ? CATARACT EXTRACTION  1974  ? Left, IOL implants  ?  CATARACT EXTRACTION Right 1979  ? IOL implants  ? CHOLECYSTECTOMY  1981  ? CHOLECYSTECTOMY    ? COSMETIC SURGERY  1995  ? eye  ? [place]  ? [place] OF UTERUS  1977  ? EYE SURGERY    ? GALLBLADDER SURGERY  1981  ? HIATAL HERNIA REPAIR  1977  ? SKIN CANCER EXCISION  2005  ? nose  ? THYROIDECTOMY  1955  ? TONSILLECTOMY  1931  ? TONSILLECTOMY    ? ? ?Allergies  ?Allergen  Reactions  ? Clindamycin/Lincomycin Diarrhea  ?  diarrhea  ? Clindamycin/Lincomycin   ?  Listed on MAR -- reaction unknown  ? Grass Pollen(K-O-R-T-Swt Vern)   ?  Listed on MAR -- reaction unknown  ? Ibuprofen Other (See Comments)  ?  Long time ago/ list on paper work  ? Ibuprofen   ?  Listed on MAR -- reaction unknown  ? Other   ?  Preparation H ?Listed on MAR -- reaction unknown  ? Penicillins Other (See Comments)  ?  Did it involve swelling of the face/tongue/throat, SOB, or low BP? Unknown ?Did it involve sudden or severe rash/hives, skin peeling, or any reaction on the inside of your mouth or nose? Unknown ?Did you need to seek medical attention at a hospital or doctor's office? Unknown ?When did it last happen?       ?If all above answers are ?NO?, may proceed with cephalosporin use.  ? Penicillins   ?  Listed on MAR -- reaction unknown  ? ? ?Allergies as of 07/25/2021   ? ?   Reactions  ? Clindamycin/lincomycin Diarrhea  ? diarrhea  ? Clindamycin/lincomycin   ? Listed on MAR -- reaction unknown  ? Grass Pollen(k-o-r-t-swt Vern)   ? Listed on MAR -- reaction unknown  ? Ibuprofen Other (See Comments)  ? Long time ago/ list on paper work  ? Ibuprofen   ? Listed on MAR -- reaction unknown  ? Other   ? Preparation H ?Listed on MAR -- reaction unknown  ? Penicillins Other (See Comments)  ? Did it involve swelling of the face/tongue/throat, SOB, or low BP? Unknown ?Did it involve sudden or severe rash/hives, skin peeling, or any reaction on the inside of your mouth or nose? Unknown ?Did you need to seek medical attention at a hospital or doctor's office? Unknown ?When did it last happen?       ?If all above answers are ?NO?, may proceed with cephalosporin use.  ? Penicillins   ? Listed on MAR -- reaction unknown  ? ?  ? ?  ?Medication List  ?  ? ?  ? Accurate as of Jul 25, 2021  4:46 PM. If you have any questions, ask your nurse or doctor.  ?  ?  ? ?  ? ?acetaminophen 325 MG tablet ?Commonly known as: TYLENOL ?Take  650 mg by mouth in the morning and at bedtime. ?  ?albuterol 108 (90 Base) MCG/ACT inhaler ?Commonly known as: VENTOLIN HFA ?Inhale 2 puffs into the lungs every 6 (six) hours as needed for wheezing. ?  ?amLODipine 5 MG tablet ?Commonly known as: NORVASC ?Take 1 tablet (5 mg total) by mouth daily. ?  ?ammonium lactate 12 % lotion ?Commonly known as: LAC-HYDRIN ?Apply 1 application. topically every morning. ?  ?apixaban 2.5 MG Tabs tablet ?Commonly known as: ELIQUIS ?Take 1 tablet (2.5 mg total) by mouth 2 (two) times daily. ?  ?azelastine 0.1 % nasal spray ?Commonly  known as: ASTELIN ?Place 1 spray into both nostrils 2 (two) times daily. Use in each nostril as directed ?  ?calcium carbonate 500 MG chewable tablet ?Commonly known as: TUMS - dosed in mg elemental calcium ?Chew 1 tablet by mouth daily as needed for indigestion. ?  ?cetirizine 10 MG tablet ?Commonly known as: ZYRTEC ?Take 10 mg by mouth as needed for allergies. ?  ?clobetasol 0.05 % external solution ?Commonly known as: TEMOVATE ?Apply 1 application topically as needed. ?  ?clotrimazole 1 % cream ?Commonly known as: LOTRIMIN ?Apply 1 application topically 2 (two) times daily. ?  ?diclofenac Sodium 1 % Gel ?Commonly known as: VOLTAREN ?Apply 2 g topically in the morning, at noon, in the evening, and at bedtime. MAR states: Dose: "Small amount", Frequency: "As needed", Instructions: "May keep at bedside and self administer" ?  ?doxycycline 100 MG tablet ?Commonly known as: VIBRA-TABS ?Take 1 tablet (100 mg total) by mouth 2 (two) times daily. ?  ?fluticasone 50 MCG/ACT nasal spray ?Commonly known as: FLONASE ?Place 1 spray into both nostrils 2 (two) times daily. ?  ?fluticasone furoate-vilanterol 100-25 MCG/ACT Aepb ?Commonly known as: BREO ELLIPTA ?Inhale 1 puff into the lungs daily. ?  ?loratadine 10 MG tablet ?Commonly known as: CLARITIN ?Take 1 tablet (10 mg total) by mouth daily. ?  ?metoprolol succinate 25 MG 24 hr tablet ?Commonly known as:  TOPROL-XL ?Take 12.5 mg by mouth daily. ?  ?metoprolol tartrate 25 MG tablet ?Commonly known as: LOPRESSOR ?Take 12.5 mg by mouth every 8 (eight) hours as needed. ?  ?mineral oil-hydrophilic petrolatum ointment ?

## 2021-07-26 DIAGNOSIS — L602 Onychogryphosis: Secondary | ICD-10-CM | POA: Diagnosis not present

## 2021-07-26 DIAGNOSIS — L03116 Cellulitis of left lower limb: Secondary | ICD-10-CM | POA: Diagnosis not present

## 2021-07-26 DIAGNOSIS — E1159 Type 2 diabetes mellitus with other circulatory complications: Secondary | ICD-10-CM | POA: Diagnosis not present

## 2021-07-26 DIAGNOSIS — L84 Corns and callosities: Secondary | ICD-10-CM | POA: Diagnosis not present

## 2021-07-26 LAB — CBC AND DIFFERENTIAL
HCT: 37 (ref 36–46)
Hemoglobin: 12.6 (ref 12.0–16.0)
Platelets: 223 10*3/uL (ref 150–400)
WBC: 5.3

## 2021-07-26 LAB — COMPREHENSIVE METABOLIC PANEL
Calcium: 9.8 (ref 8.7–10.7)
eGFR: 63

## 2021-07-26 LAB — CBC: RBC: 3.92 (ref 3.87–5.11)

## 2021-07-26 LAB — BASIC METABOLIC PANEL
BUN: 15 (ref 4–21)
CO2: 22 (ref 13–22)
Chloride: 106 (ref 99–108)
Creatinine: 0.9 (ref 0.5–1.1)
Glucose: 105
Potassium: 5.5 mEq/L — AB (ref 3.5–5.1)
Sodium: 147 (ref 137–147)

## 2021-07-28 DIAGNOSIS — Z23 Encounter for immunization: Secondary | ICD-10-CM | POA: Diagnosis not present

## 2021-08-05 DIAGNOSIS — I10 Essential (primary) hypertension: Secondary | ICD-10-CM | POA: Diagnosis not present

## 2021-08-05 LAB — BASIC METABOLIC PANEL
BUN: 17 (ref 4–21)
CO2: 29 — AB (ref 13–22)
Chloride: 107 (ref 99–108)
Creatinine: 0.9 (ref 0.5–1.1)
Glucose: 96
Potassium: 4.9 mEq/L (ref 3.5–5.1)
Sodium: 143 (ref 137–147)

## 2021-08-05 LAB — COMPREHENSIVE METABOLIC PANEL: Calcium: 9 (ref 8.7–10.7)

## 2021-08-09 ENCOUNTER — Ambulatory Visit: Payer: Medicare Other | Admitting: Neurology

## 2021-08-30 ENCOUNTER — Encounter: Payer: Self-pay | Admitting: Allergy & Immunology

## 2021-08-30 ENCOUNTER — Ambulatory Visit (INDEPENDENT_AMBULATORY_CARE_PROVIDER_SITE_OTHER): Payer: Medicare Other | Admitting: Allergy & Immunology

## 2021-08-30 VITALS — BP 122/58 | HR 61 | Temp 98.0°F | Resp 20 | Ht 58.7 in | Wt 139.6 lb

## 2021-08-30 DIAGNOSIS — J4540 Moderate persistent asthma, uncomplicated: Secondary | ICD-10-CM | POA: Diagnosis not present

## 2021-08-30 DIAGNOSIS — J302 Other seasonal allergic rhinitis: Secondary | ICD-10-CM | POA: Diagnosis not present

## 2021-08-30 MED ORDER — AZELASTINE HCL 0.1 % NA SOLN: 1.0000 | Freq: Two times a day (BID) | NASAL | 5 refills | Status: DC

## 2021-08-30 MED ORDER — TRELEGY ELLIPTA 100-62.5-25 MCG/ACT IN AEPB: 1.0000 | INHALATION_SPRAY | Freq: Every day | RESPIRATORY_TRACT | 5 refills | Status: DC

## 2021-08-30 NOTE — Progress Notes (Signed)
FOLLOW UP  Date of Service/Encounter:  08/30/21   Assessment:   Moderate persistent asthma without complication  Seasonal allergic rhinitis   We are going to change her from Breo to Trelegy today to see if this will help her control her cough a little more effectively.  She is going to give [place] an update in 1 to 2 weeks.  We gave her a sample to try today.  For her rhinitis, we are changing her from Claritin to Zyrtec.  Hopefully she tolerates this fairly well.  She has been on Claritin ever since have known her, so she could probably stand to have her antihistamine change.  Plan/Recommendations:   1. Mild persistent asthma, uncomplicated - Lung testing looked AWESOME today.  - Temporarily stop the Breo and start Trelegy one puff once daily (contains the same medications as Breo plus another medication to loosen the airways).  - Call us in one week with an update on whether this helps with your coughing. - Daily controller medication(s): Trelegy 100/62.5/25 one puff once daily - Prior to physical activity: albuterol 2 puffs 10-15 minutes before physical activity. - Asthma control goals:  * Full participation in all desired activities (may need albuterol before activity) * Albuterol use two time or less a week on average (not counting use with activity) * Cough interfering with sleep two time or less a month * Oral steroids no more than once a year * No hospitalizations  2. Allergic rhinoconjunctivitis - Continue with fluticasone and azelastine: do one spray per nostril twice daily - Stop the Claritin (loratadine) and start Zyrtec (cetirizine) '10mg'$  daily. - Hopefully this will help with the improving the postnasal drip and mucous production.   3. Return in about 6 months (around 03/01/2022).  Subjective:   Christina Lawson is a 88 y.o. female presenting today for follow up of  Chief Complaint  Patient presents with   Asthma    SOB when she does anything   Cough    dry    Allergic Rhinitis     "So far so good."    Marquelle Balow has a history of the following: Patient Active Problem List   Diagnosis Date Noted   Autonomic orthostatic hypotension 12/06/2020   Psoriasis 12/06/2020   Syncope and collapse 12/10/2019   AF (paroxysmal atrial fibrillation) ([place]) 12/10/2019   Mild cognitive impairment with memory loss 12/03/2019   Mixed incontinence urge and stress 07/30/2019   Psychophysiological insomnia 03/26/2019   Paroxysmal atrial fibrillation ([place]) 08/21/2018   NSTEMI (non-ST elevated myocardial infarction) ([place]) 05/15/2018   Atrial fibrillation with RVR ([place]) 05/13/2018   Acquired hypothyroidism 05/13/2018   Left thigh pain 10/10/2017   Anxiety 10/10/2017   H/O TIA (transient ischemic attack) and stroke 10/10/2017   Moderate persistent asthma without complication 35/59/7416   Parkinsonism ([place]) 04/11/2017   Mild persistent asthma without complication 38/45/3646   Seasonal allergic rhinitis 04/05/2017   Carpal tunnel syndrome, bilateral 07/03/2016   Numbness and tingling in both hands 06/05/2016   Drooling 04/12/2016   Posterior vitreous detachment of both eyes 06/03/2015   Senile osteopenia 03/24/2015   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  Skarlett is a 88 y.o. female presenting for a follow up visit.  She was last seen in December 2022.  At that time, her lung testing looked amazing.  We continue  with Breo 100 mcg 1 puff once daily as well as albuterol as needed.  For her allergic rhinoconjunctivitis, we continue with Flonase and Astelin as well as Claritin.  Since last visit, she has largely done fairly well.  Asthma/Respiratory Symptom History: She has been coughing for a number of months. She has not had a fever with this at all. This occurs nearly all of the time, including nights and days. After she takes a shower she tends to blow out a lot of mucous, but otherwise she gets no relief.  She has not tried using her emergency inhaler. She did do this once and it seemed to be helpful.   Allergic Rhinitis Symptom History: She remains on her nose sprays as well as Claritin.  She reports that a lot of her mucus clears up after taking a warm shower.  Says she is not sure whether her cough is related to uncontrolled asthma versus uncontrolled rhinitis.  She is open to changes.  She has not been on any antibiotics since last visit.  She remains at [facility] in the assisted living facility.  She was in the independent living for quite some time.  Otherwise, there have been no changes to her past medical history, surgical history, family history, or social history.    Review of Systems  Constitutional: Negative.  Negative for fever, malaise/fatigue and weight loss.  HENT:  Positive for congestion. Negative for ear discharge and ear pain.   Eyes:  Negative for pain, discharge and redness.  Respiratory:  Positive for cough. Negative for sputum production, shortness of breath and wheezing.   Cardiovascular: Negative.  Negative for chest pain and palpitations.  Gastrointestinal:  Negative for abdominal pain, heartburn, nausea and vomiting.  Skin: Negative.  Negative for itching and rash.  Neurological:  Negative for dizziness and headaches.  Endo/Heme/Allergies:  Negative for environmental allergies. Does not bruise/bleed easily.       Objective:   Blood pressure (!) 122/58, pulse 61,  temperature 98 F (36.7 C), temperature source Temporal, resp. rate 20, height 4' 10.7" (1.491 m), weight 139 lb 9.6 oz (63.3 kg), SpO2 97 %. Body mass index is 28.48 kg/m.    Physical Exam Vitals reviewed.  Constitutional:      Appearance: She is well-developed.     Comments: Using a walker, but she gets along very fast with it.  Very delightful.  HENT:     Head: Normocephalic and atraumatic.     Right Ear: Tympanic membrane, ear canal and external ear normal.     Left Ear: Tympanic membrane, ear canal and external ear normal.     Nose: No nasal deformity, septal deviation, mucosal edema or rhinorrhea.     Right Turbinates: Enlarged, swollen and pale.     Left Turbinates: Enlarged, swollen and pale.     Right Sinus: No maxillary sinus tenderness or frontal sinus tenderness.     Left Sinus: No maxillary sinus tenderness or frontal sinus tenderness.     Mouth/Throat:     Mouth: Mucous membranes are not pale and not dry.     Pharynx: Uvula midline.  Eyes:     General:        Right eye: No discharge.        Left eye: No discharge.     Conjunctiva/sclera: Conjunctivae normal.     Right eye: Right conjunctiva is not injected. No chemosis.    Left eye: Left conjunctiva is not injected. No chemosis.    Pupils: Pupils are equal, round, and reactive to light.  Cardiovascular:     Rate and Rhythm: Normal rate and regular rhythm.     Heart sounds: Normal heart sounds.  Pulmonary:     Effort: Pulmonary effort is normal. No tachypnea, accessory muscle usage or respiratory distress.     Breath sounds: Normal breath sounds. No wheezing, rhonchi or rales.     Comments: Moving air well in all lung fields.  No increased work of breathing. Chest:     Chest wall: No tenderness.  Lymphadenopathy:     Cervical: No cervical adenopathy.  Skin:    General: Skin is warm.     Capillary Refill: Capillary refill takes less than 2 seconds.     Coloration: Skin is not pale.     Findings: No abrasion,  erythema, petechiae or rash. Rash is not papular, urticarial or vesicular.     Comments: No eczematous or urticarial lesions noted.  Neurological:     Mental Status: She is alert.  Psychiatric:        Behavior: Behavior is cooperative.      Diagnostic studies:    Spirometry: results normal (FEV1: 0.85/104%, FVC: 1.02/87%, FEV1/FVC: 83%).    Spirometry consistent with normal pattern.    Allergy Studies: none        Salvatore Marvel, MD  [facility] [place] [facility] [place]

## 2021-08-30 NOTE — Patient Instructions (Addendum)
1. Mild persistent asthma, uncomplicated - Lung testing looked AWESOME today.  - Temporarily stop the Breo and start Trelegy one puff once daily (contains the same medications as Breo plus another medication to loosen the airways).  - Call us in one week with an update on whether this helps with your coughing. - Daily controller medication(s): Trelegy 100/62.5/25 one puff once daily - Prior to physical activity: albuterol 2 puffs 10-15 minutes before physical activity. - Asthma control goals:  * Full participation in all desired activities (may need albuterol before activity) * Albuterol use two time or less a week on average (not counting use with activity) * Cough interfering with sleep two time or less a month * Oral steroids no more than once a year * No hospitalizations  2. Allergic rhinoconjunctivitis - Continue with fluticasone and azelastine: do one spray per nostril twice daily - Stop the Claritin (loratadine) and start Zyrtec (cetirizine) '10mg'$  daily. - Hopefully this will help with the improving the postnasal drip and mucous production.   3. Return in about 6 months (around 03/01/2022).   Please inform us of any Emergency Department visits, hospitalizations, or changes in symptoms. Call us before going to the ED for breathing or allergy symptoms since we might be able to fit you in for a sick visit. Feel free to contact us anytime with any questions, problems, or concerns.  It was a pleasure to see you again today! I LOVE SEEING YOU!!!   Websites that have reliable patient information: 1. American Academy of Asthma, Allergy, and Immunology: www.aaaai.org 2. Food Allergy Research and Education (FARE): foodallergy.org 3. Mothers of Asthmatics: http://www.asthmacommunitynetwork.org 4. American College of Allergy, Asthma, and Immunology: www.acaai.org   COVID-19 Vaccine Information can be found at: ShippingScam.co.uk For  questions related to vaccine distribution or appointments, please email [email or web address] or call [phone].     "Like" [place] on Facebook and Instagram for our latest updates!       Make sure you are registered to vote! If you have moved or changed any of your contact information, you will need to get this updated before voting!  In some cases, you MAY be able to register to vote online: [email or web address]

## 2021-09-05 ENCOUNTER — Telehealth: Payer: Self-pay | Admitting: Allergy & Immunology

## 2021-09-05 DIAGNOSIS — L821 Other seborrheic keratosis: Secondary | ICD-10-CM | POA: Diagnosis not present

## 2021-09-05 DIAGNOSIS — L400 Psoriasis vulgaris: Secondary | ICD-10-CM | POA: Diagnosis not present

## 2021-09-05 NOTE — Telephone Encounter (Signed)
FYI-Christina Lawson called in and wanted to let you know her cough is much better.

## 2021-10-04 DIAGNOSIS — L84 Corns and callosities: Secondary | ICD-10-CM | POA: Diagnosis not present

## 2021-10-04 DIAGNOSIS — E1159 Type 2 diabetes mellitus with other circulatory complications: Secondary | ICD-10-CM | POA: Diagnosis not present

## 2021-10-04 DIAGNOSIS — L602 Onychogryphosis: Secondary | ICD-10-CM | POA: Diagnosis not present

## 2021-10-06 DIAGNOSIS — L400 Psoriasis vulgaris: Secondary | ICD-10-CM | POA: Diagnosis not present

## 2021-10-10 DIAGNOSIS — H04223 Epiphora due to insufficient drainage, bilateral lacrimal glands: Secondary | ICD-10-CM | POA: Diagnosis not present

## 2021-10-10 DIAGNOSIS — H02105 Unspecified ectropion of left lower eyelid: Secondary | ICD-10-CM | POA: Diagnosis not present

## 2021-10-10 DIAGNOSIS — H02102 Unspecified ectropion of right lower eyelid: Secondary | ICD-10-CM | POA: Diagnosis not present

## 2021-10-10 DIAGNOSIS — H0100A Unspecified blepharitis right eye, upper and lower eyelids: Secondary | ICD-10-CM | POA: Diagnosis not present

## 2021-11-17 ENCOUNTER — Ambulatory Visit: Payer: Medicare Other | Attending: Internal Medicine | Admitting: Internal Medicine

## 2021-11-17 VITALS — BP 132/68 | HR 62 | Resp 96 | Ht 60.0 in | Wt 139.0 lb

## 2021-11-17 DIAGNOSIS — I10 Essential (primary) hypertension: Secondary | ICD-10-CM | POA: Diagnosis not present

## 2021-11-17 DIAGNOSIS — R55 Syncope and collapse: Secondary | ICD-10-CM | POA: Diagnosis not present

## 2021-11-17 DIAGNOSIS — Z8673 Personal history of transient ischemic attack (TIA), and cerebral infarction without residual deficits: Secondary | ICD-10-CM | POA: Diagnosis not present

## 2021-11-17 DIAGNOSIS — E7800 Pure hypercholesterolemia, unspecified: Secondary | ICD-10-CM | POA: Diagnosis not present

## 2021-11-17 DIAGNOSIS — I480 Paroxysmal atrial fibrillation: Secondary | ICD-10-CM | POA: Diagnosis not present

## 2021-11-17 NOTE — Progress Notes (Signed)
Cardiology Office Note:    Date:  11/17/2021   ID:  Christina Lawson, DOB [birth year], MRN [id number]  PCP:  Christina Dad, MD  Cardiologist:  Christina Munroe, MD  Electrophysiologist:  None   Referring MD: Christina Dad, MD   Chief Complaint/Reason for Referral: Orthostatic hypotension, syncope/falls, PAF on [facility]   History of Present Illness:    Christina Lawson is a 88 y.o. female with a history of hypertension, hypothyroidism, tremor, hyperlipidemia, prior TIA, and hospitalization in 12/2019 for syncopal episode found to have significant orthostatic hypotension. She was started on pyridostigmine bromide (Mestinon) and has been tolerating this well overall.  She presents to the visit alone today.  She feels well overall with no acute concerns.   She denies dizziness or lightheadedness and is taking precautions with standing and uses a walker.  She has not been wearing compression stockings, but has not had any lower extremity swelling or red flag sx.  We reviewed indications and risks of continuing Eliquis in the setting of prior syncopal episode.  We participated in shared decision making and determined preventing stroke is her primary concern, and with no recent falls this is reasonable to continue.  The patient denies chest pain, chest pressure, dyspnea at rest or with exertion, palpitations, PND, orthopnea, or leg swelling. Denies cough, fever, chills. Denies nausea, vomiting. Denies syncope or presyncope. Denies dizziness or lightheadedness. Denies snoring.    Past Medical History:  Diagnosis Date   Allergic rhinitis    pollen and grass   Asthma    Atrial fibrillation ([place]) 05/13/2018   Atrial fibrillation ([place])    Blepharitis    Carpal tunnel syndrome, bilateral 07/03/2016   Family history of adverse reaction to anesthesia    " Bryce (88YR OLD ) DIED UNDER ANESTHESTIA    High cholesterol    HOH (hard of hearing)    Hyperlipidemia    Irregular  heart rhythm    Macular degeneration 2012   Mild cognitive impairment with memory loss 12/03/2019   Mild persistent asthma without complication 06/20/7351   Osteoporosis    Parkinsonism ([place]) 04/11/2017   Senile osteopenia 03/24/2015   Sensorineural hearing loss, bilateral 2007   Shingles 2004   Skin cancer 2005   nose   Syncope and collapse 12/11/2019   TIA (transient ischemic attack)    Torn meniscus 2006   right knee   Tremor 2006   familiar.  dx by Dr. Quillian Quince   Unspecified essential hypertension    Unspecified hypothyroidism    Urinary frequency     Past Surgical History:  Procedure Laterality Date   ABDOMINAL HYSTERECTOMY  1977   ABDOMINAL HYSTERECTOMY     CATARACT EXTRACTION  1974   Left, IOL implants   CATARACT EXTRACTION Right 1979   IOL implants   [place]   eye   [place]   SKIN CANCER EXCISION  2005   nose   THYROIDECTOMY  1955   TONSILLECTOMY  1931   TONSILLECTOMY      Current Medications: Current Meds  Medication Sig   acetaminophen (TYLENOL) 325 MG tablet Take 650 mg by mouth in the morning and at bedtime.   albuterol (VENTOLIN HFA) 108 (90 Base) MCG/ACT inhaler Inhale  2 puffs into the lungs every 6 (six) hours as needed for wheezing.   amLODipine (NORVASC) 5 MG tablet Take 1 tablet (5 mg total) by mouth daily.   ammonium lactate (LAC-HYDRIN) 12 % lotion Apply 1 application. topically every morning.   apixaban (ELIQUIS) 2.5 MG TABS tablet Take 1 tablet (2.5 mg total) by mouth 2 (two) times daily.   azelastine (ASTELIN) 0.1 % nasal spray Place 1 spray into both nostrils 2 (two) times daily. Use in each nostril as directed   calcium carbonate (TUMS - DOSED IN MG ELEMENTAL CALCIUM) 500 MG chewable tablet Chew 1 tablet by mouth daily as needed for indigestion.    cetirizine (ZYRTEC) 10 MG tablet Take 10 mg by mouth as needed for allergies.   clobetasol (TEMOVATE) 0.05 % external solution Apply 1 application topically as needed.   clotrimazole (LOTRIMIN) 1 % cream Apply 1 application topically 2 (two) times daily.   diclofenac Sodium (VOLTAREN) 1 % GEL Apply 2 g topically in the morning, at noon, in the evening, and at bedtime. MAR states: Dose: "Small amount", Frequency: "As needed", Instructions: "May keep at bedside and self administer"    doxycycline (VIBRA-TABS) 100 MG tablet Take 1 tablet (100 mg total) by mouth 2 (two) times daily.   fluticasone (FLONASE) 50 MCG/ACT nasal spray Place 1 spray into both nostrils 2 (two) times daily.   fluticasone furoate-vilanterol (BREO ELLIPTA) 100-25 MCG/ACT AEPB Inhale 1 puff into the lungs daily.   Fluticasone-Umeclidin-Vilant (TRELEGY ELLIPTA) 100-62.5-25 MCG/ACT AEPB Inhale 1 puff into the lungs daily.   loratadine (CLARITIN) 10 MG tablet Take 1 tablet (10 mg total) by mouth daily.   metoprolol succinate (TOPROL-XL) 25 MG 24 hr tablet Take 12.5 mg by mouth daily.   metoprolol tartrate (LOPRESSOR) 25 MG tablet Take 12.5 mg by mouth every 8 (eight) hours as needed.   mineral oil-hydrophilic petrolatum (AQUAPHOR) ointment Apply 1 application topically 2 (two) times daily as needed for dry skin.   Multiple Vitamins-Minerals (PRESERVISION AREDS 2+MULTI VIT) CAPS Take 1 capsule by mouth 2 (two) times daily.   oxymetazoline (AFRIN) 0.05 % nasal spray Place 2 sprays into both nostrils 2 (two) times daily as needed for congestion.    Polyethyl Glycol-Propyl Glycol (SYSTANE) 0.4-0.3 % SOLN Apply to eye.   polyethylene glycol (MIRALAX / GLYCOLAX) 17 g packet Take 8.5 g by mouth daily.   pyridostigmine (MESTINON) 60 MG tablet Take 0.5 tablets (30 mg total) by mouth daily.   senna-docusate (SENOKOT S) 8.6-50 MG tablet Take 2 tablets by mouth daily.   sertraline (ZOLOFT) 50 MG tablet Take 50 mg by mouth daily.   Tapinarof  (VTAMA) 1 % CREA Apply topically every morning.   Reviewed meds, unable to pull into note.  Allergies:   Clindamycin/lincomycin, Clindamycin/lincomycin, Grass pollen([id number]-o-[id number]-swt vern), Ibuprofen, Ibuprofen, Other, Penicillins, and Penicillins   Social History   Tobacco Use   Smoking status: Never   Smokeless tobacco: Never  Vaping Use   Vaping Use: Never used  Substance Use Topics   Alcohol use: Not Currently    Comment: on occ.   Drug use: Never     Family History: The patient's family history includes Allergies in her father; Asthma in her father; Brain cancer in her mother; Breast cancer in her cousin, cousin, maternal aunt, maternal aunt, paternal aunt, and sister; Breast cancer (age of onset: 12) in her sister; Breast cancer (age of onset: 11) in her daughter; Cancer in her mother, sister, and sister; Emphysema in her father;  Endometrial cancer in her sister; Heart disease in her maternal grandfather; Hypertension in her mother; Stroke in an other family member. There is no history of Tremor or Parkinson's disease.  ROS:   Please see the history of present illness.    All other systems reviewed and are negative.  EKGs/Labs/Other Studies Reviewed:    The following studies were reviewed today:  EKG: SR, LAD, inferior and anterior infarct pattern 62 bpm  Recent Labs: 07/26/2021: Hemoglobin 12.6; Platelets 223 08/05/2021: BUN 17; Creatinine 0.9; Potassium 4.9; Sodium 143  Recent Lipid Panel    Component Value Date/Time   CHOL 211 (H) 05/14/2018 0219   TRIG 61 05/14/2018 0219   HDL 53 05/14/2018 0219   CHOLHDL 4.0 05/14/2018 0219   VLDL 12 05/14/2018 0219   LDLCALC 146 (H) 05/14/2018 0219    Physical Exam:    VS:  BP 132/68   Pulse 62   Resp (!) 96   Ht 5' (1.524 m)   Wt 139 lb (63 kg)   BMI 27.15 kg/m     Wt Readings from Last 5 Encounters:  11/17/21 139 lb (63 kg)  08/30/21 139 lb 9.6 oz (63.3 kg)  07/15/21 135 lb 12.8 oz (61.6 kg)  06/03/21 136 lb 3.2 oz  (61.8 kg)  05/18/21 138 lb (62.6 kg)    Constitutional: No acute distress Eyes: sclera non-icteric, normal conjunctiva and lids Cardiovascular: regular rhythm, normal rate, no murmurs. S1 and S2 normal. Radial pulses normal bilaterally. No jugular venous distention.  Respiratory: clear to auscultation bilaterally GI : normal bowel sounds, soft and nontender. No distention.   MSK: extremities warm, well perfused. No edema.  NEURO: grossly nonfocal exam, moves all extremities. PSYCH: alert and oriented x 3, normal mood and affect.   ASSESSMENT:    1. Paroxysmal atrial fibrillation (HCC)   2. Syncope, unspecified syncope type   3. H/O TIA (transient ischemic attack) and stroke   4. Essential hypertension   5. Pure hypercholesterolemia    PLAN:    Paroxysmal atrial fibrillation ([place]) - Plan: EKG 12-Lead Syncope, unspecified syncope type H/O TIA (transient ischemic attack) and stroke -She will continue Eliquis 2.5 mg twice daily at this time after shared decision-making on risks and benefits of continued anticoagulation.  No bleeding complications.  CHA2DS2-VASc score is 6 (hypertension, female, age x2, prior TIA (2 points)). -Okay to continue low-dose metoprolol succinate 12.5 mg daily, hold for heart rates under 50 bpm - maintaining SR  Essential hypertension -Continue amlodipine 5 mg daily, metoprolol succinate 12.5 mg daily  Pure hypercholesterolemia -Not currently on therapy, observing due to age. Reviewed again today.   Total time of encounter: 30 minutes total time of encounter, including 20 minutes spent in face-to-face patient care on the date of this encounter. This time includes coordination of care and counseling regarding above mentioned problem list. Remainder of non-face-to-face time involved reviewing chart documents/testing relevant to the patient encounter and documentation in the medical record. I have independently reviewed documentation from referring provider.    Cherlynn Kaiser, MD, [facility]     Medication Adjustments/Labs and Tests Ordered: Current medicines are reviewed at length with the patient today.  Concerns regarding medicines are outlined above.   Orders Placed This Encounter  Procedures   EKG 12-Lead     No orders of the defined types were placed in this encounter.    Patient Instructions  Medication Instructions:  No Changes In Medications at this time.  *If you  need a refill on your cardiac medications before your next appointment, please call your pharmacy*  Follow-Up: At [facility], you and your health needs are our priority.  As part of our continuing mission to provide you with exceptional heart care, we have created designated Provider Care Teams.  These Care Teams include your primary Cardiologist (physician) and Advanced Practice Providers (APPs -  Physician Assistants and Nurse Practitioners) who all work together to provide you with the care you need, when you need it.  Your next appointment:   6 month(s)  The format for your next appointment:   In Person  Provider:   Elouise Munroe, MD

## 2021-11-17 NOTE — Patient Instructions (Signed)
Medication Instructions:  No Changes In Medications at this time.  *If you need a refill on your cardiac medications before your next appointment, please call your pharmacy*  Follow-Up: At [facility], you and your health needs are our priority.  As part of our continuing mission to provide you with exceptional heart care, we have created designated Provider Care Teams.  These Care Teams include your primary Cardiologist (physician) and Advanced Practice Providers (APPs -  Physician Assistants and Nurse Practitioners) who all work together to provide you with the care you need, when you need it.  Your next appointment:   6 month(s)  The format for your next appointment:   In Person  Provider:   Gayatri A Acharya, MD          

## 2021-12-16 DIAGNOSIS — Z23 Encounter for immunization: Secondary | ICD-10-CM | POA: Diagnosis not present

## 2021-12-22 ENCOUNTER — Encounter: Payer: Self-pay | Admitting: Adult Health

## 2021-12-22 ENCOUNTER — Non-Acute Institutional Stay: Payer: Medicare Other | Admitting: Adult Health

## 2021-12-22 DIAGNOSIS — I480 Paroxysmal atrial fibrillation: Secondary | ICD-10-CM | POA: Diagnosis not present

## 2021-12-22 DIAGNOSIS — I951 Orthostatic hypotension: Secondary | ICD-10-CM | POA: Diagnosis not present

## 2021-12-22 DIAGNOSIS — I10 Essential (primary) hypertension: Secondary | ICD-10-CM | POA: Diagnosis not present

## 2021-12-22 NOTE — Progress Notes (Signed)
Location:  Occupational psychologist of Service:  ALF (13) Provider:   Cindi Carbon, [place] [phone] Dad, MD  Patient Care Team: Christina Dad, MD as PCP - General (Internal Medicine) Elouise Munroe, MD as PCP - Cardiology (Cardiology) Community[facility] Clance, Armando Reichert, MD as Consulting Physician (Pulmonary Disease) Verl Blalock, Marijo Conception, MD (Inactive) as Consulting Physician (Cardiology) Latanya Maudlin, MD as Consulting Physician (Orthopedic Surgery) Kathrynn Ducking, MD (Inactive) as Consulting Physician (Neurology) Renda Rolls, Jennefer Bravo, MD as Referring Physician (Dermatology) Gean Quint, MD (Inactive) as Consulting Physician (Allergy and Immunology) Shon Hough, MD as Consulting Physician (Ophthalmology) Paula Compton, MD as Consulting Physician (Obstetrics and Gynecology) Vicie Mutters, MD as Consulting Physician (Otolaryngology) Raeanne Gathers, AUD (Audiology) Carmel Sacramento, CCC-A (Audiology) Gayland Curry, DO (Geriatric Medicine) Elouise Munroe, MD (Cardiology)  Extended Emergency Contact Information Primary Emergency Contact: Cherlyn Labella Home Phone: [phone] Relation: Daughter Secondary Emergency Contact: Rossi,Lynn Address: [address] Phone: [phone] Mobile Phone: [phone] Relation: Daughter  Code Status:  DNR Goals of care: Advanced Directive information    07/15/2021    8:54 AM  Advanced Directives  Does Patient Have a Medical Advance Directive? Yes  Type of Advance Directive Living will;Out of facility DNR (pink MOST or yellow form)  Does patient want to make changes to medical advance directive? No - Patient declined  Pre-existing out of facility DNR order (yellow form or pink MOST form) Yellow form placed in chart (order not valid for inpatient use);Pink MOST form placed in chart  (order not valid for inpatient use)     Chief Complaint  Patient presents with   Acute Visit    Dizzy when standing.     HPI:  Pt is a 88 y.o. female seen today for an acute visit for dizziness when standing  Nurse reports for the past two days patient has had dizziness when standing.   BP 152/73 lying, 139/75 sitting, 120/74 standing (staff did not obtain pulse)  She reports she has been anxious the past few days due to the recent war in [place]. Her family lives there and has not been eating and drinking. As well.   No sob or chest pain. Does have some chronic DOE.      Past Medical History:  Diagnosis Date   Allergic rhinitis    pollen and grass   Asthma    Atrial fibrillation ([place]) 05/13/2018   Atrial fibrillation ([place])    Blepharitis    Carpal tunnel syndrome, bilateral 07/03/2016   Family history of adverse reaction to anesthesia    " [place] (88YR OLD ) DIED UNDER ANESTHESTIA    High cholesterol    HOH (hard of hearing)    Hyperlipidemia    Irregular heart rhythm    Macular degeneration 2012   Mild cognitive impairment with memory loss 12/03/2019   Mild persistent asthma without complication 08/24/4313   Osteoporosis    Parkinsonism 04/11/2017   Senile osteopenia 03/24/2015   Sensorineural hearing loss, bilateral 2007   Shingles 2004   Skin cancer 2005   nose   Syncope and collapse 12/11/2019   TIA (transient ischemic attack)    Torn meniscus 2006   right knee   Tremor 2006   familiar.  dx by Dr. Quillian Quince   Unspecified essential hypertension  Unspecified hypothyroidism    Urinary frequency    Past Surgical History:  Procedure Laterality Date   ABDOMINAL HYSTERECTOMY  1977   ABDOMINAL HYSTERECTOMY     CATARACT EXTRACTION  1974   Left, IOL implants   CATARACT EXTRACTION Right 1979   IOL implants   [place]   eye   [place]   SKIN CANCER EXCISION  2005   nose   THYROIDECTOMY  1955   TONSILLECTOMY  1931   TONSILLECTOMY      Allergies  Allergen Reactions   Clindamycin/Lincomycin Diarrhea    diarrhea   Clindamycin/Lincomycin     Listed on MAR -- reaction unknown   Grass Pollen(K-O-R-T-Swt Vern)     Listed on MAR -- reaction unknown   Ibuprofen Other (See Comments)    Long time ago/ list on paper work   Ibuprofen     Listed on MAR -- reaction unknown   Other     Preparation H Listed on MAR -- reaction unknown   Penicillins Other (See Comments)    Did it involve swelling of the face/tongue/throat, SOB, or low BP? Unknown Did it involve sudden or severe rash/hives, skin peeling, or any reaction on the inside of your mouth or nose? Unknown Did you need to seek medical attention at a hospital or doctor's office? Unknown When did it last happen?       If all above answers are "NO", may proceed with cephalosporin use.   Penicillins     Listed on MAR -- reaction unknown    Outpatient Encounter Medications as of 12/22/2021  Medication Sig   mometasone (ELOCON) 0.1 % ointment Apply topically at bedtime.   acetaminophen (TYLENOL) 325 MG tablet Take 650 mg by mouth in the morning and at bedtime.   albuterol (VENTOLIN HFA) 108 (90 Base) MCG/ACT inhaler Inhale 2 puffs into the lungs every 6 (six) hours as needed for wheezing.   amLODipine (NORVASC) 5 MG tablet Take 1 tablet (5 mg total) by mouth daily.   ammonium lactate (LAC-HYDRIN) 12 % lotion Apply 1 application. topically every morning.   apixaban (ELIQUIS) 2.5 MG TABS tablet Take 1 tablet (2.5 mg total) by mouth 2 (two) times daily.   azelastine (ASTELIN) 0.1 % nasal spray Place 1 spray into both nostrils 2 (two) times daily. Use in each nostril as directed   calcium carbonate (TUMS - DOSED IN MG ELEMENTAL CALCIUM) 500 MG chewable tablet Chew 1 tablet by mouth daily as needed  for indigestion.   cetirizine (ZYRTEC) 10 MG tablet Take 10 mg by mouth as needed for allergies.   Cholecalciferol (VITAMIN D) 2000 units CAPS Take 1 capsule (2,000 Units total) by mouth daily. (Patient not taking: Reported on 11/17/2021)   clobetasol (TEMOVATE) 0.05 % external solution Apply 1 application topically as needed.   clotrimazole (LOTRIMIN) 1 % cream Apply 1 application topically 2 (two) times daily.   diclofenac Sodium (VOLTAREN) 1 % GEL Apply 2 g topically in the morning, at noon, in the evening, and at bedtime. MAR states: Dose: "Small amount", Frequency: "As needed", Instructions: "May keep at bedside and self administer"    fluticasone (FLONASE) 50 MCG/ACT nasal spray Place 1 spray into both nostrils 2 (two) times daily.  Fluticasone-Umeclidin-Vilant (TRELEGY ELLIPTA) 100-62.5-25 MCG/ACT AEPB Inhale 1 puff into the lungs daily.   loratadine (CLARITIN) 10 MG tablet Take 1 tablet (10 mg total) by mouth daily.   metoprolol succinate (TOPROL-XL) 25 MG 24 hr tablet Take 12.5 mg by mouth daily.   metoprolol tartrate (LOPRESSOR) 25 MG tablet Take 12.5 mg by mouth every 8 (eight) hours as needed.   mineral oil-hydrophilic petrolatum (AQUAPHOR) ointment Apply 1 application topically 2 (two) times daily as needed for dry skin.   Multiple Vitamins-Minerals (PRESERVISION AREDS 2+MULTI VIT) CAPS Take 1 capsule by mouth 2 (two) times daily.   oxymetazoline (AFRIN) 0.05 % nasal spray Place 2 sprays into both nostrils 2 (two) times daily as needed for congestion.    Polyethyl Glycol-Propyl Glycol (SYSTANE) 0.4-0.3 % SOLN Apply to eye.   polyethylene glycol (MIRALAX / GLYCOLAX) 17 g packet Take 8.5 g by mouth daily as needed.   pyridostigmine (MESTINON) 60 MG tablet Take 0.5 tablets (30 mg total) by mouth daily.   senna-docusate (SENOKOT S) 8.6-50 MG tablet Take 2 tablets by mouth daily.   sertraline (ZOLOFT) 50 MG tablet Take 50 mg by mouth daily.   [DISCONTINUED] doxycycline (VIBRA-TABS) 100 MG  tablet Take 1 tablet (100 mg total) by mouth 2 (two) times daily.   [DISCONTINUED] fluticasone furoate-vilanterol (BREO ELLIPTA) 100-25 MCG/ACT AEPB Inhale 1 puff into the lungs daily.   [DISCONTINUED] Tapinarof (VTAMA) 1 % CREA Apply topically every morning.   No facility-administered encounter medications on file as of 12/22/2021.    Review of Systems  Constitutional:  Negative for activity change, appetite change, chills, diaphoresis, fatigue, fever and unexpected weight change.  HENT:  Negative for congestion.   Respiratory:  Positive for shortness of breath (on exertion chronic). Negative for cough and wheezing.   Cardiovascular:  Negative for chest pain, palpitations and leg swelling.  Gastrointestinal:  Negative for abdominal distention, abdominal pain, constipation and diarrhea.  Genitourinary:  Negative for difficulty urinating and dysuria.  Musculoskeletal:  Positive for gait problem. Negative for arthralgias, back pain, joint swelling and myalgias.  Skin:  Positive for rash.  Neurological:  Positive for dizziness (when standing.). Negative for tremors, seizures, syncope, facial asymmetry, speech difficulty, weakness, light-headedness, numbness and headaches.  Psychiatric/Behavioral:  Negative for agitation, behavioral problems and confusion. The patient is nervous/anxious.     Immunization History  Administered Date(s) Administered   Fluad Quad(high Dose 65+) 12/16/2021   Influenza Whole 12/12/2011, 12/31/2012   Influenza, High Dose Seasonal PF 12/20/2018, 01/02/2020, 12/15/2020   Influenza,inj,Quad PF,6+ Mos 01/04/2018   Influenza-Unspecified 12/31/2014, 01/06/2016, 01/01/2017   Moderna SARS-COV2 Booster Vaccination 01/24/2020, 10/07/2020   Moderna Sars-Covid-2 Vaccination 03/23/2019, 04/22/2019, 12/22/2020   Pneumococcal Conjugate-13 07/14/2014   Pneumococcal Polysaccharide-23 03/13/2005   Tdap 04/08/2015   Zoster Recombinat (Shingrix) 03/29/2017, 05/24/2017   Zoster,  Live 03/13/2006   Pertinent  Health Maintenance Due  Topic Date Due   INFLUENZA VACCINE  Completed   DEXA SCAN  Completed      07/21/2020    9:41 AM 09/06/2020    1:01 PM 12/06/2020   12:48 PM 01/20/2021    9:37 AM 03/08/2021    4:51 PM  Fall Risk  Falls in the past year? 0 0 0 0 0  Was there an injury with Fall?    0 0  Fall Risk Category Calculator    0 0  Fall Risk Category    Low Low  Patient Fall Risk Level   Low fall risk Low fall risk Low  fall risk  Patient at Risk for Falls Due to    No Fall Risks No Fall Risks  Fall risk Follow up   Falls evaluation completed Falls evaluation completed Falls evaluation completed   Functional Status Survey:    Vitals:   12/22/21 1600  BP: (!) 164/82  Pulse: 63  Resp: 18  SpO2: 93%   There is no height or weight on file to calculate BMI. Physical Exam Vitals and nursing note reviewed.  Constitutional:      General: She is not in acute distress.    Appearance: She is not diaphoretic.  HENT:     Head: Normocephalic and atraumatic.     Nose: Nose normal.     Mouth/Throat:     Mouth: Mucous membranes are moist.     Pharynx: Oropharynx is clear.  Eyes:     Extraocular Movements: Extraocular movements intact.     Conjunctiva/sclera: Conjunctivae normal.     Pupils: Pupils are equal, round, and reactive to light.  Neck:     Vascular: No JVD.  Cardiovascular:     Rate and Rhythm: Normal rate and regular rhythm.     Heart sounds: No murmur heard. Pulmonary:     Effort: Pulmonary effort is normal. No respiratory distress.     Breath sounds: Normal breath sounds. No wheezing.  Musculoskeletal:     Right lower leg: No edema.     Left lower leg: No edema.  Skin:    General: Skin is warm and dry.  Neurological:     General: No focal deficit present.     Mental Status: She is alert and oriented to person, place, and time.     Labs reviewed: Recent Labs    03/07/21 0000 07/26/21 0000 08/05/21 0000  NA 144 147 143  K 4.8  5.5* 4.9  CL 109* 106 107  CO2 24* 22 29*  BUN '20 15 17  '$ CREATININE 0.7 0.9 0.9  CALCIUM 8.9 9.8 9.0   No results for input(s): "AST", "ALT", "ALKPHOS", "BILITOT", "PROT", "ALBUMIN" in the last 8760 hours. Recent Labs    03/07/21 0000 07/26/21 0000  WBC 4.7 5.3  HGB 11.9* 12.6  HCT 37 37  PLT 198 223   Lab Results  Component Value Date   TSH 3.50 07/22/2020   Lab Results  Component Value Date   HGBA1C 5.9 10/02/2017   Lab Results  Component Value Date   CHOL 211 (H) 05/14/2018   HDL 53 05/14/2018   LDLCALC 146 (H) 05/14/2018   TRIG 61 05/14/2018   CHOLHDL 4.0 05/14/2018    Significant Diagnostic Results in last 30 days:  No results found.  Assessment/Plan  1. Orthostatic hypotension Possible decreased intake Will check labs Encouraged oral intake Keep legs elevated, rise slowly  2. HTN Currently on norvasc will keep as is for now until labs return    3. Paroxysmal afib Followed by cardiology on eliquis   Family/ staff Communication: resident   Labs/tests ordered:  CBC BMP in am

## 2021-12-23 ENCOUNTER — Encounter: Payer: Self-pay | Admitting: Adult Health

## 2021-12-23 DIAGNOSIS — E119 Type 2 diabetes mellitus without complications: Secondary | ICD-10-CM | POA: Diagnosis not present

## 2021-12-23 LAB — CBC AND DIFFERENTIAL
HCT: 39 (ref 36–46)
Hemoglobin: 13.1 (ref 12.0–16.0)
Platelets: 213 10*3/uL (ref 150–400)
WBC: 4.7

## 2021-12-23 LAB — BASIC METABOLIC PANEL
BUN: 17 (ref 4–21)
CO2: 25 — AB (ref 13–22)
Chloride: 106 (ref 99–108)
Creatinine: 0.8 (ref 0.5–1.1)
Glucose: 99
Potassium: 4.5 mEq/L (ref 3.5–5.1)
Sodium: 141 (ref 137–147)

## 2021-12-23 LAB — TSH: TSH: 3.33 (ref 0.41–5.90)

## 2021-12-23 LAB — COMPREHENSIVE METABOLIC PANEL
Calcium: 9.6 (ref 8.7–10.7)
eGFR: 64

## 2021-12-23 LAB — CBC: RBC: 4.1 (ref 3.87–5.11)

## 2021-12-26 ENCOUNTER — Telehealth: Payer: Self-pay | Admitting: Adult Health

## 2021-12-26 MED ORDER — AMLODIPINE BESYLATE 5 MG PO TABS: 2.5000 mg | ORAL_TABLET | Freq: Every day | ORAL | 0 refills | Status: DC

## 2021-12-26 NOTE — Telephone Encounter (Signed)
Pt is having orthostatic hypotension with dizziness upon standing. This is not new, she takes mestinon. She is 88 years old and a fall risk. BMP and CBC with no acute abnormalities. Will reduce norvasc to 2.5 mg daily. Order orthostatic bp and pulse daily for 3 days. Ensure compression hose are worn.

## 2021-12-27 ENCOUNTER — Non-Acute Institutional Stay: Payer: Medicare Other | Admitting: Internal Medicine

## 2021-12-27 ENCOUNTER — Encounter: Payer: Self-pay | Admitting: Internal Medicine

## 2021-12-27 VITALS — BP 152/76 | HR 70 | Temp 97.9°F | Resp 18 | Ht 60.0 in | Wt 136.8 lb

## 2021-12-27 DIAGNOSIS — F419 Anxiety disorder, unspecified: Secondary | ICD-10-CM | POA: Diagnosis not present

## 2021-12-27 DIAGNOSIS — E039 Hypothyroidism, unspecified: Secondary | ICD-10-CM | POA: Diagnosis not present

## 2021-12-27 DIAGNOSIS — I951 Orthostatic hypotension: Secondary | ICD-10-CM | POA: Diagnosis not present

## 2021-12-27 DIAGNOSIS — I10 Essential (primary) hypertension: Secondary | ICD-10-CM | POA: Diagnosis not present

## 2021-12-27 DIAGNOSIS — R42 Dizziness and giddiness: Secondary | ICD-10-CM | POA: Diagnosis not present

## 2021-12-27 DIAGNOSIS — I480 Paroxysmal atrial fibrillation: Secondary | ICD-10-CM | POA: Diagnosis not present

## 2021-12-27 DIAGNOSIS — J4530 Mild persistent asthma, uncomplicated: Secondary | ICD-10-CM | POA: Diagnosis not present

## 2021-12-27 NOTE — Progress Notes (Signed)
Location:  [place] Service:  Clinic (12)  Provider:   Code Status: DNR Goals of Care:     12/27/2021   10:54 AM  Advanced Directives  Does Patient Have a Medical Advance Directive? Yes  Type of Paramedic of [place];Living will;Out of facility DNR (pink MOST or yellow form)  Copy of [place] in Chart? Yes - validated most recent copy scanned in chart (See row information)     Chief Complaint  Patient presents with  . Medical Management of Chronic Issues    Medical Management of Chronic Issues. Patient states she has been having trouble feeling dizzy for 2 weeks  . Quality Metric Gaps    To discuss need for Covid or postpone if patient refuses.     HPI: Patient is a 88 y.o. female seen today for medical management of chronic diseases.     Past Medical History:  Diagnosis Date  . Allergic rhinitis    pollen and grass  . Asthma   . Atrial fibrillation ([place]) 05/13/2018  . Atrial fibrillation ([place])   . Blepharitis   . Carpal tunnel syndrome, bilateral 07/03/2016  . Family history of adverse reaction to anesthesia    " [place]   . High cholesterol   . HOH (hard of hearing)   . Hyperlipidemia   . Irregular heart rhythm   . Macular degeneration 2012  . Mild cognitive impairment with memory loss 12/03/2019  . Mild persistent asthma without complication 2/44/0102  . Osteoporosis   . Parkinsonism 04/11/2017  . Senile osteopenia 03/24/2015  . Sensorineural hearing loss, bilateral 2007  . Shingles 2004  . Skin cancer 2005   nose  . Syncope and collapse 12/11/2019  . TIA (transient ischemic attack)   . Torn meniscus 2006   right knee  . Tremor 2006   familiar.  dx by Dr. Quillian Quince  . Unspecified essential hypertension   . Unspecified hypothyroidism   . Urinary frequency     Past Surgical History:  Procedure Laterality Date  . ABDOMINAL  HYSTERECTOMY  1977  . ABDOMINAL HYSTERECTOMY    . CATARACT EXTRACTION  1974   Left, IOL implants  . CATARACT EXTRACTION Right 1979   IOL implants  . CHOLECYSTECTOMY  1981  . CHOLECYSTECTOMY    . COSMETIC SURGERY  1995   eye  . [place]  . DILATION AND CURETTAGE OF UTERUS  1977  . EYE SURGERY    . [place]  . HIATAL HERNIA REPAIR  1977  . SKIN CANCER EXCISION  2005   nose  . THYROIDECTOMY  1955  . TONSILLECTOMY  1931  . TONSILLECTOMY      Allergies  Allergen Reactions  . Clindamycin/Lincomycin Diarrhea    diarrhea  . Clindamycin/Lincomycin     Listed on MAR -- reaction unknown  . Grass Pollen(K-O-R-T-Swt Vern)     Listed on MAR -- reaction unknown  . Ibuprofen Other (See Comments)    Long time ago/ list on paper work  . Ibuprofen     Listed on MAR -- reaction unknown  . Other     Preparation H Listed on MAR -- reaction unknown  . Penicillins Other (See Comments)    Did it involve swelling of the face/tongue/throat, SOB, or low BP? Unknown Did it involve sudden or severe rash/hives, skin peeling, or any reaction on the inside  of your mouth or nose? Unknown Did you need to seek medical attention at a hospital or doctor's office? Unknown When did it last happen?       If all above answers are "NO", may proceed with cephalosporin use.  Marland Kitchen Penicillins     Listed on MAR -- reaction unknown    Outpatient Encounter Medications as of 12/27/2021  Medication Sig  . chlorhexidine (PERIDEX) 0.12 % solution Use as directed 5 mLs in the mouth or throat every morning. Rinse and spit  . acetaminophen (TYLENOL) 325 MG tablet Take 650 mg by mouth 3 (three) times daily as needed.  Marland Kitchen albuterol (VENTOLIN HFA) 108 (90 Base) MCG/ACT inhaler Inhale 2 puffs into the lungs every 6 (six) hours as needed for wheezing.  Marland Kitchen amLODipine (NORVASC) 5 MG tablet Take 0.5 tablets (2.5 mg total) by mouth daily.  Marland Kitchen apixaban (ELIQUIS) 2.5 MG TABS tablet Take 1 tablet (2.5 mg  total) by mouth 2 (two) times daily.  Marland Kitchen azelastine (ASTELIN) 0.1 % nasal spray Place 1 spray into both nostrils 2 (two) times daily. Use in each nostril as directed  . calcium carbonate (TUMS - DOSED IN MG ELEMENTAL CALCIUM) 500 MG chewable tablet Chew 1 tablet by mouth daily as needed for indigestion.  . cetirizine (ZYRTEC) 10 MG tablet Take 10 mg by mouth as needed for allergies.  . Cholecalciferol (VITAMIN D) 2000 units CAPS Take 1 capsule (2,000 Units total) by mouth daily. (Patient not taking: Reported on 11/17/2021)  . clobetasol (TEMOVATE) 0.05 % external solution Apply 1 application topically as needed.  . diclofenac Sodium (VOLTAREN) 1 % GEL Apply 2 g topically in the morning, at noon, in the evening, and at bedtime. MAR states: Dose: "Small amount", Frequency: "As needed", Instructions: "May keep at bedside and self administer"   . fluticasone (FLONASE) 50 MCG/ACT nasal spray Place 1 spray into both nostrils 2 (two) times daily.  . Fluticasone-Umeclidin-Vilant (TRELEGY ELLIPTA) 100-62.5-25 MCG/ACT AEPB Inhale 1 puff into the lungs daily.  . metoprolol succinate (TOPROL-XL) 25 MG 24 hr tablet Take 12.5 mg by mouth daily. In the morning. Take 12.'5mg'$  every 8 hours as needed for palpitations.  . mineral oil-hydrophilic petrolatum (AQUAPHOR) ointment Apply 1 application topically 2 (two) times daily as needed for dry skin.  . mometasone (ELOCON) 0.1 % ointment Apply topically at bedtime.  . Multiple Vitamins-Minerals (PRESERVISION AREDS 2+MULTI VIT) CAPS Take 1 capsule by mouth 2 (two) times daily.  Marland Kitchen oxymetazoline (AFRIN) 0.05 % nasal spray Place 2 sprays into both nostrils 2 (two) times daily as needed for congestion.   Vladimir Faster Glycol-Propyl Glycol (SYSTANE) 0.4-0.3 % SOLN Apply 2 drops each eye twice daily.  . polyethylene glycol (MIRALAX / GLYCOLAX) 17 g packet Take 17 g by mouth daily as needed.  . pyridostigmine (MESTINON) 60 MG tablet Take 0.5 tablets (30 mg total) by mouth daily.  Marland Kitchen  senna-docusate (SENOKOT S) 8.6-50 MG tablet Take 2 tablets by mouth daily.  . sertraline (ZOLOFT) 50 MG tablet Take 50 mg by mouth daily.  . [DISCONTINUED] ammonium lactate (LAC-HYDRIN) 12 % lotion Apply 1 application. topically every morning.  . [DISCONTINUED] clotrimazole (LOTRIMIN) 1 % cream Apply 1 application topically 2 (two) times daily.  . [DISCONTINUED] loratadine (CLARITIN) 10 MG tablet Take 1 tablet (10 mg total) by mouth daily.  . [DISCONTINUED] metoprolol tartrate (LOPRESSOR) 25 MG tablet Take 12.5 mg by mouth every 8 (eight) hours as needed.   No facility-administered encounter medications on file as of 12/27/2021.  Review of Systems:  Review of Systems  Health Maintenance  Topic Date Due  . COVID-19 Vaccine (5 - Moderna series) 11/28/2021  . TETANUS/TDAP  04/07/2025  . Pneumonia Vaccine 90+ Years old  Completed  . INFLUENZA VACCINE  Completed  . DEXA SCAN  Completed  . Zoster Vaccines- Shingrix  Completed  . HPV VACCINES  Aged Out    Physical Exam: Vitals:   12/27/21 1047  BP: (!) 152/76  Pulse: 70  Resp: 18  Temp: 97.9 F (36.6 C)  TempSrc: Temporal  SpO2: 97%  Weight: 136 lb 12.8 oz (62.1 kg)  Height: 5' (1.524 m)   Body mass index is 26.72 kg/m. Physical Exam  Labs reviewed: Basic Metabolic Panel: Recent Labs    07/26/21 0000 08/05/21 0000 12/23/21 0000  NA 147 143 141  K 5.5* 4.9 4.5  CL 106 107 106  CO2 22 29* 25*  BUN '15 17 17  '$ CREATININE 0.9 0.9 0.8  CALCIUM 9.8 9.0 9.6  TSH  --   --  3.33   Liver Function Tests: No results for input(s): "AST", "ALT", "ALKPHOS", "BILITOT", "PROT", "ALBUMIN" in the last 8760 hours. No results for input(s): "LIPASE", "AMYLASE" in the last 8760 hours. No results for input(s): "AMMONIA" in the last 8760 hours. CBC: Recent Labs    03/07/21 0000 07/26/21 0000 12/23/21 0000  WBC 4.7 5.3 4.7  HGB 11.9* 12.6 13.1  HCT 37 37 39  PLT 198 223 213   Lipid Panel: No results for input(s): "CHOL",  "HDL", "LDLCALC", "TRIG", "CHOLHDL", "LDLDIRECT" in the last 8760 hours. Lab Results  Component Value Date   HGBA1C 5.9 10/02/2017    Procedures since last visit: No results found.  Assessment/Plan 1. Dizziness Rob Referal  2. Essential hypertension ***  3. Orthostatic hypotension ***  4. Paroxysmal atrial fibrillation (HCC) ***  5. Anxiety Change Zoloft to 75 mg  6. Acquired hypothyroidism ***  7. Mild persistent asthma without complication ***  8 HOH Ear wax and Audiologist  Labs/tests ordered:  * No order type specified * Next appt:  01/23/2022

## 2022-01-02 DIAGNOSIS — R278 Other lack of coordination: Secondary | ICD-10-CM | POA: Diagnosis not present

## 2022-01-02 DIAGNOSIS — I951 Orthostatic hypotension: Secondary | ICD-10-CM | POA: Diagnosis not present

## 2022-01-02 DIAGNOSIS — R2689 Other abnormalities of gait and mobility: Secondary | ICD-10-CM | POA: Diagnosis not present

## 2022-01-02 DIAGNOSIS — Z9181 History of falling: Secondary | ICD-10-CM | POA: Diagnosis not present

## 2022-01-02 DIAGNOSIS — H8111 Benign paroxysmal vertigo, right ear: Secondary | ICD-10-CM | POA: Diagnosis not present

## 2022-01-04 DIAGNOSIS — Z9181 History of falling: Secondary | ICD-10-CM | POA: Diagnosis not present

## 2022-01-04 DIAGNOSIS — I951 Orthostatic hypotension: Secondary | ICD-10-CM | POA: Diagnosis not present

## 2022-01-04 DIAGNOSIS — R278 Other lack of coordination: Secondary | ICD-10-CM | POA: Diagnosis not present

## 2022-01-04 DIAGNOSIS — R2689 Other abnormalities of gait and mobility: Secondary | ICD-10-CM | POA: Diagnosis not present

## 2022-01-04 DIAGNOSIS — H8111 Benign paroxysmal vertigo, right ear: Secondary | ICD-10-CM | POA: Diagnosis not present

## 2022-01-06 DIAGNOSIS — R2689 Other abnormalities of gait and mobility: Secondary | ICD-10-CM | POA: Diagnosis not present

## 2022-01-06 DIAGNOSIS — H8111 Benign paroxysmal vertigo, right ear: Secondary | ICD-10-CM | POA: Diagnosis not present

## 2022-01-06 DIAGNOSIS — I951 Orthostatic hypotension: Secondary | ICD-10-CM | POA: Diagnosis not present

## 2022-01-06 DIAGNOSIS — R278 Other lack of coordination: Secondary | ICD-10-CM | POA: Diagnosis not present

## 2022-01-06 DIAGNOSIS — Z9181 History of falling: Secondary | ICD-10-CM | POA: Diagnosis not present

## 2022-01-09 ENCOUNTER — Encounter: Payer: Self-pay | Admitting: Adult Health

## 2022-01-09 ENCOUNTER — Non-Acute Institutional Stay: Payer: Medicare Other | Admitting: Adult Health

## 2022-01-09 DIAGNOSIS — S0991XA Unspecified injury of ear, initial encounter: Secondary | ICD-10-CM

## 2022-01-09 DIAGNOSIS — H903 Sensorineural hearing loss, bilateral: Secondary | ICD-10-CM | POA: Diagnosis not present

## 2022-01-09 DIAGNOSIS — H6121 Impacted cerumen, right ear: Secondary | ICD-10-CM

## 2022-01-09 MED ORDER — CIPROFLOXACIN-DEXAMETHASONE 0.3-0.1 % OT SUSP: 10.0000 [drp] | Freq: Every day | OTIC | 0 refills | Status: AC

## 2022-01-09 NOTE — Progress Notes (Signed)
Location:  Occupational psychologist of Service:  ALF (13) Provider:   Cindi Carbon, [place] [phone]   Virgie Dad, MD  Patient Care Team: Virgie Dad, MD as PCP - General (Internal Medicine) Elouise Munroe, MD as PCP - Cardiology (Cardiology) [facility] Clance, Armando Reichert, MD as Consulting Physician (Pulmonary Disease) Verl Blalock, Marijo Conception, MD (Inactive) as Consulting Physician (Cardiology) Latanya Maudlin, MD as Consulting Physician (Orthopedic Surgery) Kathrynn Ducking, MD (Inactive) as Consulting Physician (Neurology) Renda Rolls, Jennefer Bravo, MD as Referring Physician (Dermatology) Gean Quint, MD (Inactive) as Consulting Physician (Allergy and Immunology) Shon Hough, MD as Consulting Physician (Ophthalmology) Paula Compton, MD as Consulting Physician (Obstetrics and Gynecology) Vicie Mutters, MD as Consulting Physician (Otolaryngology) Raeanne Gathers, AUD (Audiology) Carmel Sacramento, CCC-A (Audiology) Gayland Curry, DO (Geriatric Medicine) Elouise Munroe, MD (Cardiology)  Extended Emergency Contact Information Primary Emergency Contact: Cherlyn Labella Home Phone: [phone] Relation: Daughter Secondary Emergency Contact: Rossi,Lynn Address: [address] [place] of [place] Phone: [phone] Mobile Phone: [phone] Relation: Daughter  Code Status:  DNR Goals of care: Advanced Directive information    12/27/2021   10:54 AM  Advanced Directives  Does Patient Have a Medical Advance Directive? Yes  Type of Paramedic of [place];Living will;Out of facility DNR (pink MOST or yellow form)  Copy of [place] in Chart? Yes - validated most recent copy scanned in chart (See row information)     Chief Complaint  Patient presents with   Acute Visit    Ear bleeding     HPI:  Pt is a 88  y.o. female seen today for an acute visit for ear bleeding. Pt was started on cerumen impaction protocol for the left ear. Irrigation was done with syringe and cannula. Pt reports during the procedure in AL she felt pain and the procedure was stopped. Later she noticed bleeding from the left ear. She has chronic hearing loss and wears hearing aides. She can not tell a difference in her hearing at this time. Does not have pain or fever at this time.    Past Medical History:  Diagnosis Date   Allergic rhinitis    pollen and grass   Asthma    Atrial fibrillation ([place]) 05/13/2018   Atrial fibrillation ([place])    Blepharitis    Carpal tunnel syndrome, bilateral 07/03/2016   Family history of adverse reaction to anesthesia    " [place] (88YR OLD ) DIED UNDER ANESTHESTIA    High cholesterol    HOH (hard of hearing)    Hyperlipidemia    Irregular heart rhythm    Macular degeneration 2012   Mild cognitive impairment with memory loss 12/03/2019   Mild persistent asthma without complication 4/65/0354   Osteoporosis    Parkinsonism 04/11/2017   Senile osteopenia 03/24/2015   Sensorineural hearing loss, bilateral 2007   Shingles 2004   Skin cancer 2005   nose   Syncope and collapse 12/11/2019   TIA (transient ischemic attack)    Torn meniscus 2006   right knee   Tremor 2006   familiar.  dx by Dr. Quillian Quince   Unspecified essential hypertension    Unspecified hypothyroidism    Urinary frequency    Past Surgical History:  Procedure Laterality Date   ABDOMINAL HYSTERECTOMY  1977   ABDOMINAL HYSTERECTOMY  CATARACT EXTRACTION  1974   Left, IOL implants   CATARACT EXTRACTION Right 1979   IOL implants   [place]   eye   [place]   SKIN CANCER EXCISION  2005   nose   THYROIDECTOMY  1955    TONSILLECTOMY  1931   TONSILLECTOMY      Allergies  Allergen Reactions   Clindamycin/Lincomycin Diarrhea    diarrhea   Clindamycin/Lincomycin     Listed on MAR -- reaction unknown   Grass Pollen(K-O-R-T-Swt Vern)     Listed on MAR -- reaction unknown   Ibuprofen Other (See Comments)    Long time ago/ list on paper work   Ibuprofen     Listed on MAR -- reaction unknown   Other     Preparation H Listed on MAR -- reaction unknown   Penicillins Other (See Comments)    Did it involve swelling of the face/tongue/throat, SOB, or low BP? Unknown Did it involve sudden or severe rash/hives, skin peeling, or any reaction on the inside of your mouth or nose? Unknown Did you need to seek medical attention at a hospital or doctor's office? Unknown When did it last happen?       If all above answers are "NO", may proceed with cephalosporin use.   Penicillins     Listed on MAR -- reaction unknown    Outpatient Encounter Medications as of 01/09/2022  Medication Sig   acetaminophen (TYLENOL) 325 MG tablet Take 650 mg by mouth 3 (three) times daily as needed.   albuterol (VENTOLIN HFA) 108 (90 Base) MCG/ACT inhaler Inhale 2 puffs into the lungs every 6 (six) hours as needed for wheezing.   amLODipine (NORVASC) 5 MG tablet Take 0.5 tablets (2.5 mg total) by mouth daily.   apixaban (ELIQUIS) 2.5 MG TABS tablet Take 1 tablet (2.5 mg total) by mouth 2 (two) times daily.   azelastine (ASTELIN) 0.1 % nasal spray Place 1 spray into both nostrils 2 (two) times daily. Use in each nostril as directed   calcium carbonate (TUMS - DOSED IN MG ELEMENTAL CALCIUM) 500 MG chewable tablet Chew 1 tablet by mouth daily as needed for indigestion.   cetirizine (ZYRTEC) 10 MG tablet Take 10 mg by mouth as needed for allergies.   chlorhexidine (PERIDEX) 0.12 % solution Use as directed 5 mLs in the mouth or throat every morning. Rinse and spit   Cholecalciferol (VITAMIN D) 2000 units CAPS Take 1 capsule (2,000 Units  total) by mouth daily. (Patient not taking: Reported on 11/17/2021)   clobetasol (TEMOVATE) 0.05 % external solution Apply 1 application topically as needed.   diclofenac Sodium (VOLTAREN) 1 % GEL Apply 2 g topically in the morning, at noon, in the evening, and at bedtime. MAR states: Dose: "Small amount", Frequency: "As needed", Instructions: "May keep at bedside and self administer"    fluticasone (FLONASE) 50 MCG/ACT nasal spray Place 1 spray into both nostrils 2 (two) times daily.   Fluticasone-Umeclidin-Vilant (TRELEGY ELLIPTA) 100-62.5-25 MCG/ACT AEPB Inhale 1 puff into the lungs daily.   metoprolol succinate (TOPROL-XL) 25 MG 24 hr tablet Take 12.5 mg by mouth daily. In the morning. Take 12.'5mg'$  every 8 hours as needed for palpitations.   mineral oil-hydrophilic petrolatum (AQUAPHOR) ointment Apply  1 application topically 2 (two) times daily as needed for dry skin.   mometasone (ELOCON) 0.1 % ointment Apply topically at bedtime.   Multiple Vitamins-Minerals (PRESERVISION AREDS 2+MULTI VIT) CAPS Take 1 capsule by mouth 2 (two) times daily.   oxymetazoline (AFRIN) 0.05 % nasal spray Place 2 sprays into both nostrils 2 (two) times daily as needed for congestion.    Polyethyl Glycol-Propyl Glycol (SYSTANE) 0.4-0.3 % SOLN Apply 2 drops each eye twice daily.   polyethylene glycol (MIRALAX / GLYCOLAX) 17 g packet Take 17 g by mouth daily as needed.   pyridostigmine (MESTINON) 60 MG tablet Take 0.5 tablets (30 mg total) by mouth daily.   senna-docusate (SENOKOT S) 8.6-50 MG tablet Take 2 tablets by mouth daily.   sertraline (ZOLOFT) 50 MG tablet Take 50 mg by mouth daily.   No facility-administered encounter medications on file as of 01/09/2022.    Review of Systems  Constitutional:  Negative for activity change.  HENT:  Positive for ear discharge and hearing loss. Negative for ear pain, facial swelling, mouth sores, nosebleeds and postnasal drip.     Immunization History  Administered Date(s)  Administered   Fluad Quad(high Dose 65+) 12/16/2021   Influenza Whole 12/12/2011, 12/31/2012   Influenza, High Dose Seasonal PF 12/20/2018, 01/02/2020, 12/15/2020   Influenza,inj,Quad PF,6+ Mos 01/04/2018   Influenza-Unspecified 12/31/2014, 01/06/2016, 01/01/2017   Moderna Covid-19 Vaccine Bivalent Booster 22yr & up 07/28/2021   Moderna SARS-COV2 Booster Vaccination 01/24/2020, 10/07/2020   Moderna Sars-Covid-2 Vaccination 03/23/2019, 04/22/2019, 12/22/2020   Pneumococcal Conjugate-13 07/14/2014   Pneumococcal Polysaccharide-23 03/13/2005   Tdap 04/08/2015   Zoster Recombinat (Shingrix) 03/29/2017, 05/24/2017   Zoster, Live 03/13/2006   Pertinent  Health Maintenance Due  Topic Date Due   INFLUENZA VACCINE  Completed   DEXA SCAN  Completed      09/06/2020    1:01 PM 12/06/2020   12:48 PM 01/20/2021    9:37 AM 03/08/2021    4:51 PM 12/27/2021   10:52 AM  Fall Risk  Falls in the past year? 0 0 0 0 1  Was there an injury with Fall?   0 0 0  Fall Risk Category Calculator   0 0 1  Fall Risk Category   Low Low Low  Patient Fall Risk Level  Low fall risk Low fall risk Low fall risk Moderate fall risk  Patient at Risk for Falls Due to   No Fall Risks No Fall Risks History of fall(s)  Fall risk Follow up  Falls evaluation completed Falls evaluation completed Falls evaluation completed Falls evaluation completed   Functional Status Survey:    Vitals:   01/09/22 1159  Temp: (!) 97.1 F (36.2 C)   There is no height or weight on file to calculate BMI. Physical Exam Vitals reviewed.  Constitutional:      Appearance: Normal appearance.  HENT:     Right Ear: There is impacted cerumen.     Left Ear: There is no impacted cerumen.     Ears:     Comments: Not able to visualize TM. Not tender. No redness, no purulent drainage. Blood noted in canal covering visualization.     Nose: Nose normal.  Lymphadenopathy:     Cervical: No cervical adenopathy.  Neurological:     General: No  focal deficit present.     Mental Status: She is alert. Mental status is at baseline.  Psychiatric:        Mood and Affect: Mood normal.  Labs reviewed: Recent Labs    07/26/21 0000 08/05/21 0000 12/23/21 0000  NA 147 143 141  K 5.5* 4.9 4.5  CL 106 107 106  CO2 22 29* 25*  BUN '15 17 17  '$ CREATININE 0.9 0.9 0.8  CALCIUM 9.8 9.0 9.6   No results for input(s): "AST", "ALT", "ALKPHOS", "BILITOT", "PROT", "ALBUMIN" in the last 8760 hours. Recent Labs    03/07/21 0000 07/26/21 0000 12/23/21 0000  WBC 4.7 5.3 4.7  HGB 11.9* 12.6 13.1  HCT 37 37 39  PLT 198 223 213   Lab Results  Component Value Date   TSH 3.33 12/23/2021   Lab Results  Component Value Date   HGBA1C 5.9 10/02/2017   Lab Results  Component Value Date   CHOL 211 (H) 05/14/2018   HDL 53 05/14/2018   LDLCALC 146 (H) 05/14/2018   TRIG 61 05/14/2018   CHOLHDL 4.0 05/14/2018    Significant Diagnostic Results in last 30 days:  No results found.  Assessment/Plan 1. Injury of ear, initial encounter Not able to visualize TM. No current issues with pain, worsening hearing loss, or fever.  ?trauma after irrigation and cerumen removal Will start cipro otic gtts daily for 7 days to prevent infection F/U with audiology and/or ENT for further evaluation   2. Impacted cerumen of right ear Recommend removal by audiology or ENT only.   3. Sensorineural hearing loss (SNHL) of both ears Has hearing aides.     Family/ staff Communication: resident  Labs/tests ordered:  NA

## 2022-01-10 DIAGNOSIS — I951 Orthostatic hypotension: Secondary | ICD-10-CM | POA: Diagnosis not present

## 2022-01-10 DIAGNOSIS — Z9181 History of falling: Secondary | ICD-10-CM | POA: Diagnosis not present

## 2022-01-10 DIAGNOSIS — R2689 Other abnormalities of gait and mobility: Secondary | ICD-10-CM | POA: Diagnosis not present

## 2022-01-10 DIAGNOSIS — R278 Other lack of coordination: Secondary | ICD-10-CM | POA: Diagnosis not present

## 2022-01-10 DIAGNOSIS — H8111 Benign paroxysmal vertigo, right ear: Secondary | ICD-10-CM | POA: Diagnosis not present

## 2022-01-11 DIAGNOSIS — Z23 Encounter for immunization: Secondary | ICD-10-CM | POA: Diagnosis not present

## 2022-01-12 DIAGNOSIS — S00412A Abrasion of left ear, initial encounter: Secondary | ICD-10-CM | POA: Diagnosis not present

## 2022-01-12 DIAGNOSIS — R2689 Other abnormalities of gait and mobility: Secondary | ICD-10-CM | POA: Diagnosis not present

## 2022-01-12 DIAGNOSIS — Z9181 History of falling: Secondary | ICD-10-CM | POA: Diagnosis not present

## 2022-01-12 DIAGNOSIS — H6121 Impacted cerumen, right ear: Secondary | ICD-10-CM | POA: Diagnosis not present

## 2022-01-12 DIAGNOSIS — Z974 Presence of external hearing-aid: Secondary | ICD-10-CM | POA: Diagnosis not present

## 2022-01-12 DIAGNOSIS — R278 Other lack of coordination: Secondary | ICD-10-CM | POA: Diagnosis not present

## 2022-01-12 DIAGNOSIS — I951 Orthostatic hypotension: Secondary | ICD-10-CM | POA: Diagnosis not present

## 2022-01-12 DIAGNOSIS — H8111 Benign paroxysmal vertigo, right ear: Secondary | ICD-10-CM | POA: Diagnosis not present

## 2022-01-18 DIAGNOSIS — I951 Orthostatic hypotension: Secondary | ICD-10-CM | POA: Diagnosis not present

## 2022-01-18 DIAGNOSIS — R278 Other lack of coordination: Secondary | ICD-10-CM | POA: Diagnosis not present

## 2022-01-18 DIAGNOSIS — Z9181 History of falling: Secondary | ICD-10-CM | POA: Diagnosis not present

## 2022-01-18 DIAGNOSIS — H8111 Benign paroxysmal vertigo, right ear: Secondary | ICD-10-CM | POA: Diagnosis not present

## 2022-01-18 DIAGNOSIS — R2689 Other abnormalities of gait and mobility: Secondary | ICD-10-CM | POA: Diagnosis not present

## 2022-01-23 ENCOUNTER — Ambulatory Visit (INDEPENDENT_AMBULATORY_CARE_PROVIDER_SITE_OTHER): Payer: Medicare Other | Admitting: Family

## 2022-01-23 ENCOUNTER — Encounter: Payer: Self-pay | Admitting: Family

## 2022-01-23 DIAGNOSIS — Z0000 Encounter for general adult medical examination without abnormal findings: Secondary | ICD-10-CM

## 2022-01-23 NOTE — Patient Instructions (Signed)
[name] , Thank you for taking time to come for your Medicare Wellness Visit. I appreciate your ongoing commitment to your health goals. Please review the following plan we discussed and let me know if I can assist you in the future.   Screening recommendations/referrals: Colonoscopy N/A  Mammogram N/A Bone Density : Up to date  Recommended yearly ophthalmology/optometry visit for glaucoma screening and checkup Recommended yearly dental visit for hygiene and checkup  Vaccinations: Influenza vaccine- due annually in September/October Pneumococcal vaccine : Up to date  Tdap vaccine : Up to date  Shingles vaccine : Up to date     Advanced directives: Yes   Conditions/risks identified: advanced age (>35mn, >>52women);hypertension  Next appointment: 1 year    Preventive Care 620Years and Older, Female Preventive care refers to lifestyle choices and visits with your health care provider that can promote health and wellness. What does preventive care include? A yearly physical exam. This is also called an annual well check. Dental exams once or twice a year. Routine eye exams. Ask your health care provider how often you should have your eyes checked. Personal lifestyle choices, including: Daily care of your teeth and gums. Regular physical activity. Eating a healthy diet. Avoiding tobacco and drug use. Limiting alcohol use. Practicing safe sex. Taking low-dose aspirin every day. Taking vitamin and mineral supplements as recommended by your health care provider. What happens during an annual well check? The services and screenings done by your health care provider during your annual well check will depend on your age, overall health, lifestyle risk factors, and family history of disease. Counseling  Your health care provider may ask you questions about your: Alcohol use. Tobacco use. Drug use. Emotional well-being. Home and relationship well-being. Sexual  activity. Eating habits. History of falls. Memory and ability to understand (cognition). Work and work [employer] health. Screening  You may have the following tests or measurements: Height, weight, and BMI. Blood pressure. Lipid and cholesterol levels. These may be checked every 5 years, or more frequently if you are over 541years old. Skin check. Lung cancer screening. You may have this screening every year starting at age 289if you have a 30-pack-year history of smoking and currently smoke or have quit within the past 15 years. Fecal occult blood test (FOBT) of the stool. You may have this test every year starting at age 86 Flexible sigmoidoscopy or colonoscopy. You may have a sigmoidoscopy every 5 years or a colonoscopy every 10 years starting at age 86 Hepatitis C blood test. Hepatitis B blood test. Sexually transmitted disease (STD) testing. Diabetes screening. This is done by checking your blood sugar (glucose) after you have not eaten for a while (fasting). You may have this done every 1-3 years. Bone density scan. This is done to screen for osteoporosis. You may have this done starting at age 331 Mammogram. This may be done every 1-2 years. Talk to your health care provider about how often you should have regular mammograms. Talk with your health care provider about your test results, treatment options, and if necessary, the need for more tests. Vaccines  Your health care provider may recommend certain vaccines, such as: Influenza vaccine. This is recommended every year. Tetanus, diphtheria, and acellular pertussis (Tdap, Td) vaccine. You may need a Td booster every 10 years. Zoster vaccine. You may need this after age 374 Pneumococcal 13-valent conjugate (PCV13) vaccine. One dose is recommended after age 326 Pneumococcal polysaccharide (PPSV23) vaccine. One dose is  recommended after age 35. Talk to your health care provider about which screenings and vaccines  you need and how often you need them. This information is not intended to replace advice given to you by your health care provider. Make sure you discuss any questions you have with your health care provider. Document Released: 03/26/2015 Document Revised: 11/17/2015 Document Reviewed: 12/29/2014 Elsevier Interactive Patient Education  2017 [place] Prevention in the Home Falls can cause injuries. They can happen to people of all ages. There are many things you can do to make your home safe and to help prevent falls. What can I do on the outside of my home? Regularly fix the edges of walkways and driveways and fix any cracks. Remove anything that might make you trip as you walk through a door, such as a raised step or threshold. Trim any bushes or trees on the path to your home. Use bright outdoor lighting. Clear any walking paths of anything that might make someone trip, such as rocks or tools. Regularly check to see if handrails are loose or broken. Make sure that both sides of any steps have handrails. Any raised decks and porches should have guardrails on the edges. Have any leaves, snow, or ice cleared regularly. Use sand or salt on walking paths during winter. Clean up any spills in your garage right away. This includes oil or grease spills. What can I do in the bathroom? Use night lights. Install grab bars by the toilet and in the tub and shower. Do not use towel bars as grab bars. Use non-skid mats or decals in the tub or shower. If you need to sit down in the shower, use a plastic, non-slip stool. Keep the floor dry. Clean up any water that spills on the floor as soon as it happens. Remove soap buildup in the tub or shower regularly. Attach bath mats securely with double-sided non-slip rug tape. Do not have throw rugs and other things on the floor that can make you trip. What can I do in the bedroom? Use night lights. Make sure that you have a light by your bed that  is easy to reach. Do not use any sheets or blankets that are too big for your bed. They should not hang down onto the floor. Have a firm chair that has side arms. You can use this for support while you get dressed. Do not have throw rugs and other things on the floor that can make you trip. What can I do in the kitchen? Clean up any spills right away. Avoid walking on wet floors. Keep items that you use a lot in easy-to-reach places. If you need to reach something above you, use a strong step stool that has a grab bar. Keep electrical cords out of the way. Do not use floor polish or wax that makes floors slippery. If you must use wax, use non-skid floor wax. Do not have throw rugs and other things on the floor that can make you trip. What can I do with my stairs? Do not leave any items on the stairs. Make sure that there are handrails on both sides of the stairs and use them. Fix handrails that are broken or loose. Make sure that handrails are as long as the stairways. Check any carpeting to make sure that it is firmly attached to the stairs. Fix any carpet that is loose or worn. Avoid having throw rugs at the top or bottom of the stairs. If  you do have throw rugs, attach them to the floor with carpet tape. Make sure that you have a light switch at the top of the stairs and the bottom of the stairs. If you do not have them, ask someone to add them for you. What else can I do to help prevent falls? Wear shoes that: Do not have high heels. Have rubber bottoms. Are comfortable and fit you well. Are closed at the toe. Do not wear sandals. If you use a stepladder: Make sure that it is fully opened. Do not climb a closed stepladder. Make sure that both sides of the stepladder are locked into place. Ask someone to hold it for you, if possible. Clearly mark and make sure that you can see: Any grab bars or handrails. First and last steps. Where the edge of each step is. Use tools that help you  move around (mobility aids) if they are needed. These include: Canes. Walkers. Scooters. Crutches. Turn on the lights when you go into a dark area. Replace any light bulbs as soon as they burn out. Set up your furniture so you have a clear path. Avoid moving your furniture around. If any of your floors are uneven, fix them. If there are any pets around you, be aware of where they are. Review your medicines with your doctor. Some medicines can make you feel dizzy. This can increase your chance of falling. Ask your doctor what other things that you can do to help prevent falls. This information is not intended to replace advice given to you by your health care provider. Make sure you discuss any questions you have with your health care provider. Document Released: 12/24/2008 Document Revised: 08/05/2015 Document Reviewed: 04/03/2014 Elsevier Interactive Patient Education  2017 [employer].

## 2022-01-23 NOTE — Progress Notes (Signed)
Subjective:   Christina Lawson is a 88 y.o. female who presents for Medicare Annual (Subsequent) preventive examination.  Review of Systems     Cardiac Risk Factors include: advanced age (>82mn, >>49women);hypertension     Objective:    There were no vitals filed for this visit. There is no height or weight on file to calculate BMI.     01/23/2022    8:40 AM 12/27/2021   10:54 AM 07/15/2021    8:54 AM 04/29/2021    9:43 AM 03/08/2021    4:51 PM 01/20/2021    9:46 AM 12/06/2020   12:48 PM  Advanced Directives  Does Patient Have a Medical Advance Directive? Yes Yes Yes Yes Yes Yes Yes  Type of AParamedicof ASteely HollowLiving will;Out of facility DNR (pink MOST or yellow form) H[place]outhLiving will;Out of facility DNR (pink MOST or yellow form) Living will;Out of facility DNR (pink MOST or yellow form) HTerrilLiving will;Out of facility DNR (pink MOST or yellow form) HCottage GroveLiving will;Out of facility DNR (pink MOST or yellow form) HSunset VillageLiving will;Out of facility DNR (pink MOST or yellow form) Living will;Out of facility DNR (pink MOST or yellow form);Healthcare Power of Attorney  Does patient want to make changes to medical advance directive? No - Patient declined  No - Patient declined No - Patient declined No - Patient declined No - Patient declined No - Patient declined  Copy of HBloomingtonin Chart? Yes - validated most recent copy scanned in chart (See row information) Yes - validated most recent copy scanned in chart (See row information)  Yes - validated most recent copy scanned in chart (See row information) Yes - validated most recent copy scanned in chart (See row information) Yes - validated most recent copy scanned in chart (See row information) Yes - validated most recent copy scanned in chart (See row information)  Pre-existing out of facility DNR  order (yellow form or pink MOST form)   Yellow form placed in chart (order not valid for inpatient use);Pink MOST form placed in chart (order not valid for inpatient use)    Yellow form placed in chart (order not valid for inpatient use)    Current Medications (verified) Outpatient Encounter Medications as of 01/23/2022  Medication Sig   acetaminophen (TYLENOL) 325 MG tablet Take 650 mg by mouth 3 (three) times daily as needed.   albuterol (VENTOLIN HFA) 108 (90 Base) MCG/ACT inhaler Inhale 2 puffs into the lungs every 6 (six) hours as needed for wheezing.   amLODipine (NORVASC) 5 MG tablet Take 0.5 tablets (2.5 mg total) by mouth daily.   apixaban (ELIQUIS) 2.5 MG TABS tablet Take 1 tablet (2.5 mg total) by mouth 2 (two) times daily.   azelastine (ASTELIN) 0.1 % nasal spray Place 1 spray into both nostrils 2 (two) times daily. Use in each nostril as directed   calcium carbonate (TUMS - DOSED IN MG ELEMENTAL CALCIUM) 500 MG chewable tablet Chew 1 tablet by mouth daily as needed for indigestion.   cetirizine (ZYRTEC) 10 MG tablet Take 10 mg by mouth as needed for allergies.   chlorhexidine (PERIDEX) 0.12 % solution Use as directed 5 mLs in the mouth or throat every morning. Rinse and spit   clobetasol (TEMOVATE) 0.05 % external solution Apply 1 application topically as needed.   diclofenac Sodium (VOLTAREN) 1 % GEL Apply 2 g topically in the morning, at noon, in  the evening, and at bedtime. MAR states: Dose: "Small amount", Frequency: "As needed", Instructions: "May keep at bedside and self administer"    fluticasone (FLONASE) 50 MCG/ACT nasal spray Place 1 spray into both nostrils 2 (two) times daily.   Fluticasone-Umeclidin-Vilant (TRELEGY ELLIPTA) 100-62.5-25 MCG/ACT AEPB Inhale 1 puff into the lungs daily.   metoprolol succinate (TOPROL-XL) 25 MG 24 hr tablet Take 12.5 mg by mouth daily. In the morning. Take 12.'5mg'$  every 8 hours as needed for palpitations.   mineral oil-hydrophilic petrolatum  (AQUAPHOR) ointment Apply 1 application topically 2 (two) times daily as needed for dry skin.   mometasone (ELOCON) 0.1 % ointment Apply topically at bedtime.   Multiple Vitamins-Minerals (PRESERVISION AREDS 2+MULTI VIT) CAPS Take 1 capsule by mouth 2 (two) times daily.   oxymetazoline (AFRIN) 0.05 % nasal spray Place 2 sprays into both nostrils 2 (two) times daily as needed for congestion.    Polyethyl Glycol-Propyl Glycol (SYSTANE) 0.4-0.3 % SOLN Apply 2 drops each eye twice daily.   polyethylene glycol (MIRALAX / GLYCOLAX) 17 g packet Take 17 g by mouth daily as needed.   pyridostigmine (MESTINON) 60 MG tablet Take 0.5 tablets (30 mg total) by mouth daily.   senna-docusate (SENOKOT S) 8.6-50 MG tablet Take 2 tablets by mouth daily.   sertraline (ZOLOFT) 50 MG tablet Take 50 mg by mouth daily.   [DISCONTINUED] Cholecalciferol (VITAMIN D) 2000 units CAPS Take 1 capsule (2,000 Units total) by mouth daily. (Patient not taking: Reported on 11/17/2021)   No facility-administered encounter medications on file as of 01/23/2022.    Allergies (verified) Clindamycin/lincomycin, Clindamycin/lincomycin, Grass pollen(k-o-r-t-swt vern), Ibuprofen, Ibuprofen, Other, Penicillins, and Penicillins   History: Past Medical History:  Diagnosis Date   Allergic rhinitis    pollen and grass   Asthma    Atrial fibrillation ([place]) 05/13/2018   Atrial fibrillation ([facility]CC)    Blepharitis    Carpal tunnel syndrome, bilateral 07/03/2016   Family history of adverse reaction to anesthesia    " [place] (88YR OLD ) DIED UNDER ANESTHESTIA    High cholesterol    HOH (hard of hearing)    Hyperlipidemia    Irregular heart rhythm    Macular degeneration 2012   Mild cognitive impairment with memory loss 12/03/2019   Mild persistent asthma without complication 9/56/2130   Osteoporosis    Parkinsonism 04/11/2017   Senile osteopenia 03/24/2015   Sensorineural hearing loss, bilateral 2007   Shingles 2004   Skin  cancer 2005   nose   Syncope and collapse 12/11/2019   TIA (transient ischemic attack)    Torn meniscus 2006   right knee   Tremor 2006   familiar.  dx by Dr. Quillian Quince   Unspecified essential hypertension    Unspecified hypothyroidism    Urinary frequency    Past Surgical History:  Procedure Laterality Date   ABDOMINAL HYSTERECTOMY  1977   ABDOMINAL HYSTERECTOMY     CATARACT EXTRACTION  1974   Left, IOL implants   CATARACT EXTRACTION Right 1979   IOL implants   [place]   eye   [place]   SKIN CANCER EXCISION  2005   nose   [place]  Family History  Problem Relation Age of Onset   Brain cancer Mother    Cancer Mother        Brain   Hypertension Mother    Emphysema Father    Allergies Father    Asthma Father    Cancer Sister        Cervical   Endometrial cancer Sister    Breast cancer Sister 36   Cancer Sister        Breast, metastasized to bone   Breast cancer Sister        unsuer of age   Breast cancer Maternal Aunt    Breast cancer Maternal Aunt    Breast cancer Paternal Aunt    Heart disease Maternal Grandfather    Breast cancer Daughter 29   Breast cancer Cousin    Breast cancer Cousin    Stroke Other    Tremor Neg Hx    Parkinson's disease Neg Hx    Social History   Socioeconomic History   Marital status: Single    Spouse name: Not on file   Number of children: 2   Years of education: college   Highest education level: Not on file  Occupational History   Occupation: retired Education officer, museum    Comment: retired  Tobacco Use   Smoking status: Never   Smokeless tobacco: Never  Scientific laboratory technician Use: Never used  Substance and Sexual Activity   Alcohol use: Not Currently    Comment: on occ.   Drug  use: Never   Sexual activity: Not Currently  Other Topics Concern   Not on file  Social History Narrative   ** Merged History Encounter **       Lives at [place]. Married 1958. Never smoked Alcohol minimal Exercise predominantly walking, water aerobic Retired Education officer, museum     Social Determinants of [place] Strain: Low Risk  (11/20/2017)   Overall Financial Resource Strain (CARDIA)    Difficulty of Paying Living Expenses: Not hard at all  Food Insecurity: No Food Insecurity (11/20/2017)   Hunger Vital Sign    Worried About Running Out of Food in the Last Year: Never true    Ran Out of Food in the Last Year: Never true  Transportation Needs: No Transportation Needs (11/20/2017)   PRAPARE - Hydrologist (Medical): No    Lack of Transportation (Non-Medical): No  Physical Activity: Insufficiently Active (11/20/2017)   Exercise Vital Sign    Days of Exercise per Week: 3 days    Minutes of Exercise per Session: 40 min  Stress: No Stress Concern Present (11/20/2017)   Streator    Feeling of Stress : Only a little  Social Connections: Moderately Isolated (11/20/2017)   Social Connection and Isolation Panel [NHANES]    Frequency of Communication with Friends and Family: More than three times a week    Frequency of Social Gatherings with Friends and Family: More than three times a week    Attends Religious Services: Never    Marine scientist or Organizations: No    Attends Archivist Meetings: Never    Marital Status: Widowed    Tobacco Counseling Counseling given: Not Answered   Clinical Intake:  Pre-visit preparation completed: No  Pain : No/denies pain     BMI - recorded: 26.72 Nutritional Status: BMI 25 -29 Overweight Nutritional Risks: None Diabetes: No  How often do you need to have someone help you when you read  instructions, pamphlets, or other written materials from your doctor or pharmacy?: 1 - Never What is the last grade level you completed in school?: Masters Degree  Diabetic? No   Interpreter Needed?: No      Activities of Daily Living    01/23/2022    9:41 AM  In your present state of health, do you have any difficulty performing the following activities:  Hearing? 1  Comment wears hearing Aids  Vision? 0  Difficulty concentrating or making decisions? 1  Comment [place]  Walking or climbing stairs? 0  Dressing or bathing? 0  Doing errands, shopping? 1  Comment resides in [place] and eating ? N  Using the Toilet? N  In the past six months, have you accidently leaked urine? N  Do you have problems with loss of bowel control? N  Managing your Medications? Y  Comment Has Nurse Tech  Managing your Finances? N  Housekeeping or managing your Housekeeping? Y  Comment Has Chartered certified accountant from [place]    Patient Care Team: Virgie Dad, MD as PCP - General (Internal Medicine) Elouise Munroe, MD as PCP - Cardiology (Cardiology) [facility], Armando Reichert, MD as Consulting Physician (Pulmonary Disease) Verl Blalock, Marijo Conception, MD (Inactive) as Consulting Physician (Cardiology) Latanya Maudlin, MD as Consulting Physician (Orthopedic Surgery) Kathrynn Ducking, MD (Inactive) as Consulting Physician (Neurology) [place], Jennefer Bravo, MD as Referring Physician (Dermatology) Gean Quint, MD (Inactive) as Consulting Physician (Allergy and Immunology) Shon Hough, MD as Consulting Physician (Ophthalmology) Paula Compton, MD as Consulting Physician (Obstetrics and Gynecology) Vicie Mutters, MD as Consulting Physician (Otolaryngology) Raeanne Gathers, AUD (Audiology) Carmel Sacramento, CCC-A (Audiology) Gayland Curry, DO (Inactive) (Geriatric Medicine) Elouise Munroe, MD (Cardiology)  Indicate any recent Medical Services you  may have received from other than [facility] providers in the past year (date may be approximate).     Assessment:   This is a routine wellness examination for [place].  Hearing/Vision screen Hearing Screening   '125Hz'$   Right ear Pass  Left ear Pass    Dietary issues and exercise activities discussed: Current Exercise Habits: Home exercise routine, Type of exercise: stretching, Time (Minutes): 20, Frequency (Times/Week): 3, Weekly Exercise (Minutes/Week): 60, Intensity: Mild, Exercise limited by: None identified   Goals Addressed             This Visit's Progress    Increase water intake   On track    Increase water intake by 1-2 glasses per day     Patient Stated   Not on track    Improve hearing and sight       Depression Screen    01/23/2022    9:25 AM 12/27/2021   10:54 AM 01/20/2021    9:38 AM 01/16/2020    9:55 AM 07/30/2019    9:30 AM 03/26/2019    9:04 AM 01/15/2019    8:54 AM  PHQ 2/9 Scores  PHQ - 2 Score 0 0 0 0 0 0 0  PHQ- 9 Score 0        Exception Documentation Other- indicate reason in comment box        Not completed for AWV          Fall Risk    01/23/2022    8:41 AM 12/27/2021   10:52 AM 03/08/2021    4:51 PM 01/20/2021    9:37 AM 12/06/2020  12:48 PM  Fall Risk   Falls in the past year? 1 1 0 0 0  Number falls in past yr: 0 0 0 0 0  Injury with Fall? 0 0 0 0   Risk for fall due to : History of fall(s);Impaired balance/gait History of fall(s) No Fall Risks No Fall Risks   Follow up Falls evaluation completed Falls evaluation completed Falls evaluation completed Falls evaluation completed Falls evaluation completed    [place]:  Any stairs in or around the home? No  If so, are there any without handrails? No  Home free of loose throw rugs in walkways, pet beds, electrical cords, etc? No  Adequate lighting in your home to reduce risk of falls? Yes   ASSISTIVE DEVICES UTILIZED TO PREVENT FALLS:  Life  alert? Yes  Use of a cane, walker or w/c? Yes  Grab bars in the bathroom? Yes  Shower chair or bench in shower? Yes  Elevated toilet seat or a handicapped toilet? Yes   TIMED UP AND GO:  Was the test performed? No .  Length of time to ambulate 10 feet: N/A sec.   Gait slow and steady with assistive device  Cognitive Function:    11/20/2017    1:49 PM 09/19/2016   10:37 AM 09/29/2015   10:00 AM  MMSE - Mini Mental State Exam  Orientation to time '5 4 5  '$ Orientation to Place '5 5 5  '$ Registration '3 3 3  '$ Attention/ Calculation '5 5 5  '$ Recall 1 0 3  Language- name 2 objects '2 2 2  '$ Language- repeat '1 1 1  '$ Language- follow 3 step command '3 3 3  '$ Language- read & follow direction '1 1 1  '$ Write a sentence '1 1 1  '$ Copy design '1 1 1  '$ Total score '28 26 30        '$ 01/23/2022    9:32 AM 01/20/2021    9:39 AM 01/16/2020    9:57 AM 01/15/2019    8:59 AM  6CIT Screen  What Year? 0 points 0 points 0 points 0 points  What month? 0 points 0 points 0 points 0 points  What time? 0 points 0 points 0 points 0 points  Count back from 20 4 points 0 points 0 points 0 points  Months in reverse 0 points 0 points 0 points 2 points  Repeat phrase 0 points 2 points 0 points 0 points  Total Score 4 points 2 points 0 points 2 points    Immunizations Immunization History  Administered Date(s) Administered   Fluad Quad(high Dose 65+) 12/16/2021   Influenza Whole 12/12/2011, 12/31/2012   Influenza, High Dose Seasonal PF 12/20/2018, 01/02/2020, 12/15/2020   Influenza,inj,Quad PF,6+ Mos 01/04/2018   Influenza-Unspecified 12/31/2014, 01/06/2016, 01/01/2017   Moderna Covid-19 Vaccine Bivalent Booster 27yr & up 07/28/2021   Moderna SARS-COV2 Booster Vaccination 01/24/2020, 10/07/2020   Moderna Sars-Covid-2 Vaccination 03/23/2019, 04/22/2019, 12/22/2020   Pneumococcal Conjugate-13 07/14/2014   Pneumococcal Polysaccharide-23 03/13/2005   Tdap 04/08/2015   Zoster Recombinat (Shingrix) 03/29/2017, 05/24/2017    Zoster, Live 03/13/2006    TDAP status: Up to date  Flu Vaccine status: Up to date  Pneumococcal vaccine status: Up to date  Covid-19 vaccine status: Completed vaccineswill obtain records from [facility]  Qualifies for Shingles Vaccine? Yes   Zostavax completed Yes   Shingrix Completed?: Yes  Screening Tests Health Maintenance  Topic Date Due   COVID-19 Vaccine (5 - Moderna series) 11/28/2021  Medicare Annual Wellness (AWV)  01/24/2023   TETANUS/TDAP  04/07/2025   Pneumonia Vaccine 78+ Years old  Completed   INFLUENZA VACCINE  Completed   DEXA SCAN  Completed   Zoster Vaccines- Shingrix  Completed   HPV VACCINES  Aged Out    Health Maintenance  Health Maintenance Due  Topic Date Due   COVID-19 Vaccine (5 - Moderna series) 11/28/2021    Colorectal cancer screening: No longer required.   Mammogram status: No longer required due to due advance age .  Bone Density status: Completed 01/29/2015. Results reflect: Bone density results: OSTEOPENIA. Repeat every 5 years.  Lung Cancer Screening: (Low Dose CT Chest recommended if Age 24-80 years, 30 pack-year currently smoking OR have quit w/in 15years.) does not qualify.   Lung Cancer Screening Referral: No   Additional Screening:  Hepatitis C Screening: does not qualify; Completed No   Vision Screening: Recommended annual ophthalmology exams for early detection of glaucoma and other disorders of the eye. Is the patient up to date with their annual eye exam?  Yes  Who is the provider or what is the name of the office in which the patient attends annual eye exams? Dr.McCuen  If pt is not established with a provider, would they like to be referred to a provider to establish care? No .   Dental Screening: Recommended annual dental exams for proper oral hygiene  Community Resource Referral / Chronic Care Management: CRR required this visit?  No   CCM required this visit?  No      Plan:     I have personally  reviewed and noted the following in the patient's chart:   Medical and social history Use of alcohol, tobacco or illicit drugs  Current medications and supplements including opioid prescriptions. Patient is not currently taking opioid prescriptions. Functional ability and status Nutritional status Physical activity Advanced directives List of other physicians Hospitalizations, surgeries, and ER visits in previous 12 months Vitals Screenings to include cognitive, depression, and falls Referrals and appointments  In addition, I have reviewed and discussed with patient certain preventive protocols, quality metrics, and best practice recommendations. A written personalized care plan for preventive services as well as general preventive health recommendations were provided to patient.   Sandrea Hughs, NP   01/23/2022   Nurse Notes: due for COVID-19 booster vaccine but states was given at [facility].Earl Gala CMA will Call to obtain records of latest COVID-19

## 2022-01-23 NOTE — Progress Notes (Signed)
This service is provided via telemedicine  No vital signs collected/recorded due to the encounter was a telemedicine visit.   Location of patient (ex: home, work):  home  Patient consents to a telephone visit:  yes  Location of the provider (ex: office, home):  [facility]  Names of all persons participating in the telemedicine service and their role in the encounter:  patient, .Dinah Ngetich, Np, Earl Gala [facility]   Time spent on call:  15 min [name],you are scheduled for a virtual visit with your provider today.    Just as we do with appointments in the office, we must obtain your consent to participate.  Your consent will be active for this visit and any virtual visit you may have with one of our providers in the next 365 days.    If you have a MyChart account, I can also send a copy of this consent to you electronically.  All virtual visits are billed to your insurance company just like a traditional visit in the office.  As this is a virtual visit, video technology does not allow for your provider to perform a traditional examination.  This may limit your provider's ability to fully assess your condition.  If your provider identifies any concerns that need to be evaluated in person or the need to arrange testing such as labs, EKG, etc, we will make arrangements to do so.    Although advances in technology are sophisticated, we cannot ensure that it will always work on either your end or our end.  If the connection with a video visit is poor, we may have to switch to a telephone visit.  With either a video or telephone visit, we are not always able to ensure that we have a secure connection.   I need to obtain your verbal consent now.   Are you willing to proceed with your visit today?   Christina Lawson has provided verbal consent on 01/23/2022 for a virtual visit (video or telephone).   Debe Coder, [place] 01/23/2022  9:38 AM

## 2022-01-27 DIAGNOSIS — S00412A Abrasion of left ear, initial encounter: Secondary | ICD-10-CM | POA: Diagnosis not present

## 2022-02-09 ENCOUNTER — Other Ambulatory Visit: Payer: Self-pay

## 2022-02-09 ENCOUNTER — Encounter: Payer: Self-pay | Admitting: Allergy & Immunology

## 2022-02-09 ENCOUNTER — Ambulatory Visit (INDEPENDENT_AMBULATORY_CARE_PROVIDER_SITE_OTHER): Payer: Medicare Other | Admitting: Allergy & Immunology

## 2022-02-09 VITALS — BP 124/70 | HR 72 | Temp 98.0°F | Resp 16

## 2022-02-09 DIAGNOSIS — J4540 Moderate persistent asthma, uncomplicated: Secondary | ICD-10-CM

## 2022-02-09 DIAGNOSIS — J302 Other seasonal allergic rhinitis: Secondary | ICD-10-CM

## 2022-02-09 MED ORDER — FLUTICASONE PROPIONATE 50 MCG/ACT NA SUSP: 1.0000 | Freq: Two times a day (BID) | NASAL | 5 refills | Status: DC

## 2022-02-09 MED ORDER — AZELASTINE HCL 0.1 % NA SOLN: 1.0000 | Freq: Two times a day (BID) | NASAL | 5 refills | Status: DC

## 2022-02-09 MED ORDER — ALBUTEROL SULFATE HFA 108 (90 BASE) MCG/ACT IN AERS: 2.0000 | INHALATION_SPRAY | Freq: Four times a day (QID) | RESPIRATORY_TRACT | 1 refills | Status: DC | PRN

## 2022-02-09 MED ORDER — TRELEGY ELLIPTA 100-62.5-25 MCG/ACT IN AEPB: 1.0000 | INHALATION_SPRAY | Freq: Every day | RESPIRATORY_TRACT | 5 refills | Status: DC

## 2022-02-09 MED ORDER — CETIRIZINE HCL 10 MG PO TABS: 10.0000 mg | ORAL_TABLET | Freq: Every day | ORAL | 5 refills | Status: DC

## 2022-02-09 NOTE — Progress Notes (Signed)
FOLLOW UP  Date of Service/Encounter:  02/09/22   Assessment:   Moderate persistent asthma without complication   Seasonal allergic rhinitis  Paroxysmal atrial fibrillation - on metoprolol  Hypertension - on amlodipine  History of TIA - on Eliquis   Plan/Recommendations:   1. Mild persistent asthma, uncomplicated - Lung testing looked AWESOME today.  - We are not going to make any changes at this time.  - Daily controller medication(s): Trelegy 100/62.5/25 one puff once daily - Prior to physical activity: albuterol 2 puffs 10-15 minutes before physical activity. - Asthma control goals:  * Full participation in all desired activities (may need albuterol before activity) * Albuterol use two time or less a week on average (not counting use with activity) * Cough interfering with sleep two time or less a month * Oral steroids no more than once a year * No hospitalizations  2. Allergic rhinoconjunctivitis - Continue with fluticasone and azelastine: do one spray per nostril twice daily - Continue the Zyrtec (cetirizine) '10mg'$  daily. - Hopefully this will help with the improving the postnasal drip and mucous production.   3. Return in about 6 months (around 08/10/2022).  Subjective:   Christina Lawson is a 88 y.o. female presenting today for follow up of  Chief Complaint  Patient presents with   Asthma    Christina Lawson has a history of the following: Patient Active Problem List   Diagnosis Date Noted   Autonomic orthostatic hypotension 12/06/2020   Psoriasis 12/06/2020   Syncope and collapse 12/10/2019   AF (paroxysmal atrial fibrillation) ([place]) 12/10/2019   Mild cognitive impairment with memory loss 12/03/2019   Mixed incontinence urge and stress 07/30/2019   Psychophysiological insomnia 03/26/2019   Paroxysmal atrial fibrillation ([place]) 08/21/2018   NSTEMI (non-ST elevated myocardial infarction) ([place]) 05/15/2018   Atrial fibrillation with RVR ([place])  05/13/2018   Acquired hypothyroidism 05/13/2018   Left thigh pain 10/10/2017   Anxiety 10/10/2017   H/O TIA (transient ischemic attack) and stroke 10/10/2017   Moderate persistent asthma without complication 56/38/7564   Parkinsonism 04/11/2017   Mild persistent asthma without complication 33/29/5188   Seasonal allergic rhinitis 04/05/2017   Carpal tunnel syndrome, bilateral 07/03/2016   Numbness and tingling in both hands 06/05/2016   Drooling 04/12/2016   Posterior vitreous detachment of both eyes 06/03/2015   Senile osteopenia 03/24/2015   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  Christina Lawson is a 88 y.o. female presenting for a follow up visit.  She was last seen in June 2023.  At that time, we changed her from [facility] to Trelegy because of increased coughing.  We also changed her from Claritin to Zyrtec to see if this would help with the postnasal drip.  Since last visit, she has done very well.  Asthma/Respiratory Symptom History: She remains on the Trelegy  one puff once daily.  She feels like this has helped with her coughing that she was experiencing at the last visit.  She  is having some shortness of breath, especially when she gets up in the morning and gets stressed.  She attributes this to her age.  She has not been on prednisone for her symptoms.  Overall, she is doing very well.  Allergic Rhinitis Symptom History: She remains on Zyrtec as  well as the nasal sprays.  She has not been on antibiotics.  She does have all of her updated vaccines.  She has not received the RSV vaccine, but does have plans to get it.  She saw Dr. Margaretann Loveless in September 2023.  At that time, she was continued on Eliquis 2.5 mg twice daily.  She was also continued on low-dose metoprolol as well as amlodipine 5 mg daily.  She is very thankful to have Dr. Margaretann Loveless in her life.  She did several grandkids come into town for Thanksgiving.  She even had a grandchild who lives in [place].  Otherwise, there have been no changes to her past medical history, surgical history, family history, or social history.    Review of Systems  Constitutional: Negative.  Negative for fever, malaise/fatigue and weight loss.  HENT:  Positive for congestion. Negative for ear discharge and ear pain.   Eyes:  Negative for pain, discharge and redness.  Respiratory:  Positive for cough. Negative for sputum production, shortness of breath and wheezing.   Cardiovascular: Negative.  Negative for chest pain and palpitations.  Gastrointestinal:  Negative for abdominal pain, heartburn, nausea and vomiting.  Skin: Negative.  Negative for itching and rash.  Neurological:  Negative for dizziness and headaches.  Endo/Heme/Allergies:  Negative for environmental allergies. Does not bruise/bleed easily.       Objective:   Blood pressure 124/70, pulse 72, temperature 98 F (36.7 C), temperature source Temporal, resp. rate 16, SpO2 95 %. There is no height or weight on file to calculate BMI.    Physical Exam Vitals reviewed.  Constitutional:      Appearance: She is well-developed.     Comments: Using a walker, but she gets along very fast with it.  Very delightful.  HENT:     Head: Normocephalic and atraumatic.     Right Ear: Tympanic membrane, ear canal and external ear normal.     Left Ear: Tympanic membrane, ear canal and external ear normal.     Nose: No nasal deformity, septal  deviation, mucosal edema or rhinorrhea.     Right Turbinates: Enlarged, swollen and pale.     Left Turbinates: Enlarged, swollen and pale.     Right Sinus: No maxillary sinus tenderness or frontal sinus tenderness.     Left Sinus: No maxillary sinus tenderness or frontal sinus tenderness.     Comments: No nasal polyps.     Mouth/Throat:     Mouth: Mucous membranes are not pale and not dry.     Pharynx: Uvula midline.  Eyes:     General:        Right eye: No discharge.        Left eye: No discharge.     Conjunctiva/sclera: Conjunctivae normal.     Right eye: Right conjunctiva is not injected. No chemosis.    Left eye: Left conjunctiva is not injected. No chemosis.    Pupils: Pupils are equal, round, and reactive to light.  Cardiovascular:     Rate and Rhythm: Normal rate and regular rhythm.     Heart sounds: Normal heart sounds.  Pulmonary:     Effort: Pulmonary effort is normal. No tachypnea, accessory  muscle usage or respiratory distress.     Breath sounds: Normal breath sounds. No wheezing, rhonchi or rales.     Comments: Moving air well in all lung fields.  No increased work of breathing. Chest:     Chest wall: No tenderness.  Lymphadenopathy:     Cervical: No cervical adenopathy.  Skin:    General: Skin is warm.     Capillary Refill: Capillary refill takes less than 2 seconds.     Coloration: Skin is not pale.     Findings: No abrasion, erythema, petechiae or rash. Rash is not papular, urticarial or vesicular.     Comments: No eczematous or urticarial lesions noted.  Neurological:     Mental Status: She is alert.  Psychiatric:        Behavior: Behavior is cooperative.      Diagnostic studies:    Spirometry: results normal (FEV1: 0.89/114%, FVC: 1.00/89%, FEV1/FVC: 89%).    Spirometry consistent with normal pattern.   Allergy Studies: none        Salvatore Marvel, MD  [facility] [place]

## 2022-02-09 NOTE — Patient Instructions (Addendum)
1. Mild persistent asthma, uncomplicated - Lung testing looked AWESOME today.  - We are not going to make any changes at this time.  - Daily controller medication(s): Trelegy 100/62.5/25 one puff once daily - Prior to physical activity: albuterol 2 puffs 10-15 minutes before physical activity. - Asthma control goals:  * Full participation in all desired activities (may need albuterol before activity) * Albuterol use two time or less a week on average (not counting use with activity) * Cough interfering with sleep two time or less a month * Oral steroids no more than once a year * No hospitalizations  2. Allergic rhinoconjunctivitis - Continue with fluticasone and azelastine: do one spray per nostril twice daily - Continue the Zyrtec (cetirizine) '10mg'$  daily. - Hopefully this will help with the improving the postnasal drip and mucous production.   3. Return in about 6 months (around 08/10/2022).   Please inform us of any Emergency Department visits, hospitalizations, or changes in symptoms. Call us before going to the ED for breathing or allergy symptoms since we might be able to fit you in for a sick visit. Feel free to contact us anytime with any questions, problems, or concerns.  It was a pleasure to see you again today! I LOVE SEEING YOU!!! I will let you know the next time Apolonio Schneiders has a flute rehearsal there!   Websites that have reliable patient information: 1. American Academy of Asthma, Allergy, and Immunology: www.aaaai.org 2. Food Allergy Research and Education (FARE): foodallergy.org 3. Mothers of Asthmatics: http://www.asthmacommunitynetwork.org 4. American College of Allergy, Asthma, and Immunology: www.acaai.org   COVID-19 Vaccine Information can be found at: ShippingScam.co.uk For questions related to vaccine distribution or appointments, please email [email or web address] or call [phone].     "Like" [place] on  Facebook and Instagram for our latest updates!       Make sure you are registered to vote! If you have moved or changed any of your contact information, you will need to get this updated before voting!  In some cases, you MAY be able to register to vote online: [email or web address]

## 2022-04-02 NOTE — Progress Notes (Unsigned)
Location: [facility]  POS: clinic  Provider:  Cindi Carbon, [place] [phone]   Code Status: DNR Goals of Care:     04/03/2022    2:47 PM  Advanced Directives  Does Patient Have a Medical Advance Directive? Yes  Type of Paramedic of [place];Living will;Out of facility DNR (pink MOST or yellow form)  Does patient want to make changes to medical advance directive? No - Patient declined  Copy of [place] in Chart? Yes - validated most recent copy scanned in chart (See row information)     Chief Complaint  Patient presents with   Medical Management of Chronic Issues    3 month follow up. Patient states she would like to discusses her eyes, her medication for legs, and discuss colace     HPI: Patient is a 89 y.o. female seen today for medical management of chronic diseases.  PMH significant for afib, asthma, CTS, HLD, HOH, OP, HTN, TIA, Hypothyroidism, NSTEMI, parkinsonism, MCI, orthostatic hypotension, and frequency.   Followed by allegy and asthma and on trelegy. Doing  well.  02/09/22 per their note (FEV1: 0.89/114%, FVC: 1.00/89%, FEV1/FVC: 89%).    Had some anxiety and zoloft was increased Oct of 2023  Orthostatic hypotension: norvasc previously reduced. On mestinon. Did work with therapy for BPPV as well.   HOH: followed by audiology   CTS: wears a glove on the right hand and splint on the left hand, has seen OT and does exercises. Still has some left hand numbness.   Psoriasis:   Afib: on eliquis followed by cardiology. Has hx of TIA and syncope. On metoprolol. In SR at last cardiology apt.  Past Medical History:  Diagnosis Date   Allergic rhinitis    pollen and grass   Asthma    Atrial fibrillation ([place]) 05/13/2018   Atrial fibrillation ([place])    Blepharitis    Carpal tunnel syndrome, bilateral 07/03/2016   Family history of adverse reaction to anesthesia    " [place] (89YR  OLD ) DIED UNDER ANESTHESTIA    High cholesterol    HOH (hard of hearing)    Hyperlipidemia    Irregular heart rhythm    Macular degeneration 2012   Mild cognitive impairment with memory loss 12/03/2019   Mild persistent asthma without complication 0/27/2536   Osteoporosis    Parkinsonism 04/11/2017   Senile osteopenia 03/24/2015   Sensorineural hearing loss, bilateral 2007   Shingles 2004   Skin cancer 2005   nose   Syncope and collapse 12/11/2019   TIA (transient ischemic attack)    Torn meniscus 2006   right knee   Tremor 2006   familiar.  dx by Dr. Quillian Quince   Unspecified essential hypertension    Unspecified hypothyroidism    Urinary frequency     Past Surgical History:  Procedure Laterality Date   ABDOMINAL HYSTERECTOMY  1977   ABDOMINAL HYSTERECTOMY     CATARACT EXTRACTION  1974   Left, IOL implants   CATARACT EXTRACTION Right 1979   IOL implants   [place]   eye   [place]   SKIN CANCER EXCISION  2005   nose   THYROIDECTOMY  1955  TONSILLECTOMY  1931   TONSILLECTOMY      Allergies  Allergen Reactions   Clindamycin/Lincomycin Diarrhea    diarrhea   Clindamycin/Lincomycin     Listed on MAR -- reaction unknown   Grass Pollen([id number]-O-[id number]-T-Swt Vern)     Listed on MAR -- reaction unknown   Ibuprofen Other (See Comments)    Long time ago/ list on paper work   Ibuprofen     Listed on MAR -- reaction unknown   Other     Preparation H Listed on MAR -- reaction unknown   Penicillins Other (See Comments)    Did it involve swelling of the face/tongue/throat, SOB, or low BP? Unknown Did it involve sudden or severe rash/hives, skin peeling, or any reaction on the inside of your mouth or nose? Unknown Did you need to seek medical attention at a hospital or doctor's office?  Unknown When did it last happen?       If all above answers are "NO", may proceed with cephalosporin use.   Penicillins     Listed on MAR -- reaction unknown    Outpatient Encounter Medications as of 04/03/2022  Medication Sig   acetaminophen (TYLENOL) 325 MG tablet Take 650 mg by mouth 3 (three) times daily as needed.   albuterol (VENTOLIN HFA) 108 (90 Base) MCG/ACT inhaler Inhale 2 puffs into the lungs every 6 (six) hours as needed for wheezing.   amLODipine (NORVASC) 2.5 MG tablet Take 2.5 mg by mouth daily.   apixaban (ELIQUIS) 2.5 MG TABS tablet Take 1 tablet (2.5 mg total) by mouth 2 (two) times daily.   azelastine (ASTELIN) 0.1 % nasal spray Place 1 spray into both nostrils 2 (two) times daily. Use in each nostril as directed   calcium carbonate (TUMS - DOSED IN MG ELEMENTAL CALCIUM) 500 MG chewable tablet Chew 1 tablet by mouth daily as needed for indigestion.   cetirizine (ZYRTEC) 10 MG tablet Take 1 tablet (10 mg total) by mouth daily.   chlorhexidine (PERIDEX) 0.12 % solution Use as directed 5 mLs in the mouth or throat every morning. Rinse and spit   clobetasol (TEMOVATE) 0.05 % external solution Apply 1 application topically as needed.   diclofenac Sodium (VOLTAREN) 1 % GEL Apply 2 g topically in the morning, at noon, in the evening, and at bedtime. MAR states: Dose: "Small amount", Frequency: "As needed", Instructions: "May keep at bedside and self administer"    fluticasone (FLONASE) 50 MCG/ACT nasal spray Place 1 spray into both nostrils 2 (two) times daily.   Fluticasone-Umeclidin-Vilant (TRELEGY ELLIPTA) 100-62.5-25 MCG/ACT AEPB Inhale 1 puff into the lungs daily.   metoprolol succinate (TOPROL-XL) 25 MG 24 hr tablet Take 12.5 mg by mouth daily. In the morning. Take 12.'5mg'$  every 8 hours as needed for palpitations.   mineral oil-hydrophilic petrolatum (AQUAPHOR) ointment Apply 1 application topically 2 (two) times daily as needed for dry skin.   Multiple Vitamins-Minerals  (PRESERVISION AREDS 2+MULTI VIT) CAPS Take 1 capsule by mouth 2 (two) times daily.   oxymetazoline (AFRIN) 0.05 % nasal spray Place 2 sprays into both nostrils 2 (two) times daily as needed for congestion.    Polyethyl Glycol-Propyl Glycol (SYSTANE) 0.4-0.3 % SOLN Apply 2 drops each eye twice daily.   polyethylene glycol (MIRALAX / GLYCOLAX) 17 g packet Take 17 g by mouth daily as needed.   pyridostigmine (MESTINON) 60 MG tablet Take 0.5 tablets (30 mg total) by mouth daily.   senna-docusate (SENOKOT S) 8.6-50 MG tablet Take 2 tablets by  mouth daily.   sertraline (ZOLOFT) 50 MG tablet Take 50 mg by mouth daily.   amLODipine (NORVASC) 5 MG tablet Take 0.5 tablets (2.5 mg total) by mouth daily. (Patient not taking: Reported on 02/09/2022)   mometasone (ELOCON) 0.1 % ointment Apply topically at bedtime.   No facility-administered encounter medications on file as of 04/03/2022.    Review of Systems:  Review of Systems  Constitutional:  Negative for activity change, appetite change, chills, diaphoresis, fatigue, fever and unexpected weight change.  HENT:  Negative for congestion.   Eyes:  Positive for visual disturbance (feels like her vision is worsening). Negative for photophobia, pain, discharge, redness and itching.  Respiratory:  Negative for cough, shortness of breath (with exertion chronic) and wheezing.   Cardiovascular:  Positive for leg swelling. Negative for chest pain and palpitations.  Gastrointestinal:  Negative for abdominal distention, abdominal pain, constipation and diarrhea.  Genitourinary:  Negative for difficulty urinating and dysuria.  Musculoskeletal:  Positive for gait problem. Negative for arthralgias, back pain, joint swelling and myalgias.  Skin:  Positive for rash.  Neurological:  Positive for tremors. Negative for dizziness, seizures, syncope, facial asymmetry, speech difficulty, weakness, light-headedness, numbness (left hand) and headaches.  Psychiatric/Behavioral:   Negative for agitation, behavioral problems and confusion.     Health Maintenance  Topic Date Due   COVID-19 Vaccine (8 - 2023-24 season) 04/19/2022 (Originally 03/08/2022)   Medicare Annual Wellness (AWV)  01/24/2023   DTaP/Tdap/Td (2 - Td or Tdap) 04/07/2025   Pneumonia Vaccine 59+ Years old  Completed   INFLUENZA VACCINE  Completed   DEXA SCAN  Completed   Zoster Vaccines- Shingrix  Completed   HPV VACCINES  Aged Out    Physical Exam: Vitals:   04/03/22 1447  BP: 134/82  Pulse: 64  Resp: 18  Temp: 98 F (36.7 C)  TempSrc: Temporal  SpO2: 98%  Weight: 139 lb 3.2 oz (63.1 kg)  Height: 5' (1.524 m)   Body mass index is 27.19 kg/m. Wt Readings from Last 3 Encounters:  04/03/22 139 lb 3.2 oz (63.1 kg)  12/27/21 136 lb 12.8 oz (62.1 kg)  11/17/21 139 lb (63 kg)    Physical Exam Vitals and nursing note reviewed.  Constitutional:      General: She is not in acute distress.    Appearance: She is not diaphoretic.  HENT:     Head: Normocephalic and atraumatic.     Right Ear: Tympanic membrane and ear canal normal.     Left Ear: Tympanic membrane and ear canal normal.     Nose: Nose normal. No congestion.     Mouth/Throat:     Mouth: Mucous membranes are moist.     Pharynx: Oropharynx is clear.  Eyes:     General:        Right eye: No discharge.        Left eye: No discharge.     Conjunctiva/sclera: Conjunctivae normal.     Comments: Pupils are equal and reactive but irregular shaped bilat  Neck:     Vascular: No JVD.  Cardiovascular:     Rate and Rhythm: Normal rate and regular rhythm.     Heart sounds: No murmur heard. Pulmonary:     Effort: Pulmonary effort is normal. No respiratory distress.     Breath sounds: Normal breath sounds. No wheezing.  Abdominal:     General: Bowel sounds are normal. There is no distension.     Palpations: Abdomen is soft.  Tenderness: There is no abdominal tenderness.  Musculoskeletal:     Comments: Trace edema bilat   Skin:    General: Skin is warm and dry.       Neurological:     Mental Status: She is alert and oriented to person, place, and time.  Psychiatric:        Mood and Affect: Mood normal.     Labs reviewed: Basic Metabolic Panel: Recent Labs    07/26/21 0000 08/05/21 0000 12/23/21 0000  NA 147 143 141  K 5.5* 4.9 4.5  CL 106 107 106  CO2 22 29* 25*  BUN '15 17 17  '$ CREATININE 0.9 0.9 0.8  CALCIUM 9.8 9.0 9.6  TSH  --   --  3.33   Liver Function Tests: No results for input(s): "AST", "ALT", "ALKPHOS", "BILITOT", "PROT", "ALBUMIN" in the last 8760 hours.  No results for input(s): "LIPASE", "AMYLASE" in the last 8760 hours. No results for input(s): "AMMONIA" in the last 8760 hours. CBC: Recent Labs    07/26/21 0000 12/23/21 0000  WBC 5.3 4.7  HGB 12.6 13.1  HCT 37 39  PLT 223 213   Lipid Panel: No results for input(s): "CHOL", "HDL", "LDLCALC", "TRIG", "CHOLHDL", "LDLDIRECT" in the last 8760 hours. Lab Results  Component Value Date   HGBA1C 5.9 10/02/2017    Procedures since last visit: No results found.  Assessment/Plan 1. Parkinsonism, unspecified Parkinsonism type ([place]) Doing well in the AL setting using her walker On mestinon for low bp without issues.   2. Mild cognitive impairment with memory loss   3. Acquired hypothyroidism Lab Results  Component Value Date   TSH 3.33 12/23/2021    4. Mild persistent asthma without complication No recent issues Continue Breo  5. AF (paroxysmal atrial fibrillation) ([facility]) In SR at visit with cardiology Continue Eliquis for CVA risk reduction  Rate is controlled with Toprol  6. Essential hypertension Controlled  Continues on Norvasc   7. CTS Continues with mild symptoms on the left hand Continue wrist splint/glove, exercises.   8. Psoriasis Improved  Continue clobetasol and flucinonide.   Total time 68mn:  time greater than 50% of total time spent doing pt counseling and coordination of care     Labs/tests ordered:  * No order type specified *CBC BMP prior to next apt Next appt:  F/U with Dr. GLyndel Safein 4 months

## 2022-04-03 ENCOUNTER — Encounter: Payer: Self-pay | Admitting: Adult Health

## 2022-04-03 ENCOUNTER — Non-Acute Institutional Stay: Payer: Medicare Other | Admitting: Adult Health

## 2022-04-03 VITALS — BP 134/82 | HR 64 | Temp 98.0°F | Resp 18 | Ht 60.0 in | Wt 139.2 lb

## 2022-04-03 DIAGNOSIS — L409 Psoriasis, unspecified: Secondary | ICD-10-CM

## 2022-04-03 DIAGNOSIS — I951 Orthostatic hypotension: Secondary | ICD-10-CM

## 2022-04-03 DIAGNOSIS — I480 Paroxysmal atrial fibrillation: Secondary | ICD-10-CM

## 2022-04-03 DIAGNOSIS — E039 Hypothyroidism, unspecified: Secondary | ICD-10-CM

## 2022-04-03 DIAGNOSIS — J4540 Moderate persistent asthma, uncomplicated: Secondary | ICD-10-CM

## 2022-04-03 DIAGNOSIS — G250 Essential tremor: Secondary | ICD-10-CM | POA: Diagnosis not present

## 2022-04-03 DIAGNOSIS — K5904 Chronic idiopathic constipation: Secondary | ICD-10-CM

## 2022-04-05 ENCOUNTER — Encounter: Payer: Self-pay | Admitting: Adult Health

## 2022-04-25 ENCOUNTER — Encounter: Payer: Self-pay | Admitting: Orthopedic Surgery

## 2022-04-25 ENCOUNTER — Non-Acute Institutional Stay: Payer: Medicare Other | Admitting: Orthopedic Surgery

## 2022-04-25 DIAGNOSIS — M546 Pain in thoracic spine: Secondary | ICD-10-CM

## 2022-04-25 MED ORDER — LIDOCAINE 4 % EX PTCH: 1.0000 | MEDICATED_PATCH | CUTANEOUS | 0 refills | Status: DC

## 2022-04-25 NOTE — Progress Notes (Addendum)
Location:   [facility] Room Number: 624 A Place of Service:  ALF [phone]) Provider:  Hazle Nordmann, NP  Mahlon Gammon, MD  Patient Care Team: Mahlon Gammon, MD as PCP - General (Internal Medicine) Parke Poisson, MD as PCP - Cardiology (Cardiology) Community, [facility] Clance, Maree Krabbe, MD as Consulting Physician (Pulmonary Disease) Daleen Squibb, Jesse Sans, MD (Inactive) as Consulting Physician (Cardiology) Ranee Gosselin, MD as Consulting Physician (Orthopedic Surgery) York Spaniel, MD (Inactive) as Consulting Physician (Neurology) Haverstock, Elvin So, MD as Referring Physician (Dermatology) Baxter Hire, MD (Inactive) as Consulting Physician (Allergy and Immunology) Mckinley Jewel, MD as Consulting Physician (Ophthalmology) Huel Cote, MD as Consulting Physician (Obstetrics and Gynecology) Ermalinda Barrios, MD as Consulting Physician (Otolaryngology) Landry Mellow, AUD (Audiology) Aneta Mins, CCC-A (Audiology) Kermit Balo, DO (Geriatric Medicine) Parke Poisson, MD (Cardiology)  Extended Emergency Contact Information Primary Emergency Contact: [facility] Phone: [phone] Relation: Daughter Secondary Emergency Contact: Rossi,Lynn Address: [address]          [place] [phone] [place] Home Phone: [phone] Mobile Phone: [phone] Relation: Daughter  Code Status:  DNR Goals of care: Advanced Directive information    04/25/2022    9:59 AM  Advanced Directives  Does Patient Have a Medical Advance Directive? Yes  Type of Estate agent of [place];Living will;Out of facility DNR (pink MOST or yellow form)  Does patient want to make changes to medical advance directive? No - Patient declined  Copy of Healthcare Power of Attorney in Chart? Yes - validated most recent copy scanned in chart (See row information)     Chief Complaint  Patient presents with    Acute Visit    Left flank pain    HPI:  Pt is a 89 y.o. female seen today for an acute visit for left sided back pain.   She currently resides on the assisted living unit at [facility]. PMH: PAF, HTN, HLD, cardiomegaly, NSTEMI, TIA, asthma, hypothyroidism, cognitive impairment, OA, osteopenia, urinary incontinence, macular degeneration, and anxiety.   She reports another resident was about to fall and landed on her instead. She denies pain after incident, but began to have left sided back pain yesterday afternoon. Pain increased with movement or taking deep breath. Denies radiation of pain. Pain described as sharp. Nursing gave her tylenol and she reports improved pain.      Past Medical History:  Diagnosis Date   Allergic rhinitis    pollen and grass   Asthma    Atrial fibrillation (HCC) 05/13/2018   Atrial fibrillation (HCC)    Blepharitis    Carpal tunnel syndrome, bilateral 07/03/2016   Family history of adverse reaction to anesthesia    " MY FIRST COUNSINS CHILD (63YR OLD ) DIED UNDER ANESTHESTIA    High cholesterol    HOH (hard of hearing)    Hyperlipidemia    Irregular heart rhythm    Macular degeneration 2012   Mild cognitive impairment with memory loss 12/03/2019   Mild persistent asthma without complication 04/05/2017   Osteoporosis    Parkinsonism 04/11/2017   Senile osteopenia 03/24/2015   Sensorineural hearing loss, bilateral 2007   Shingles 2004   Skin cancer 2005   nose   Syncope and collapse 12/11/2019   TIA (transient ischemic attack)    Torn meniscus 2006   right knee   Tremor 2006   familiar.  dx by Dr. Hedy Camara   Unspecified essential hypertension  Unspecified hypothyroidism    Urinary frequency    Past Surgical History:  Procedure Laterality Date   ABDOMINAL HYSTERECTOMY  1977   ABDOMINAL HYSTERECTOMY     CATARACT EXTRACTION  1974   Left, IOL implants   CATARACT EXTRACTION Right 1979   IOL implants   CHOLECYSTECTOMY  1981   CHOLECYSTECTOMY      COSMETIC SURGERY  1995   eye   CYSTOCELE REPAIR  1993   DILATION AND CURETTAGE OF UTERUS  1977   EYE SURGERY     GALLBLADDER SURGERY  1981   HIATAL HERNIA REPAIR  1977   SKIN CANCER EXCISION  2005   nose   THYROIDECTOMY  1955   TONSILLECTOMY  1931   TONSILLECTOMY      Allergies  Allergen Reactions   Clindamycin/Lincomycin Diarrhea    diarrhea   Clindamycin/Lincomycin     Listed on MAR -- reaction unknown   Grass Pollen([id number]-[id number] Vern)     Listed on MAR -- reaction unknown   Ibuprofen Other (See Comments)    Long time ago/ list on paper work   Ibuprofen     Listed on MAR -- reaction unknown   Other     Preparation H Listed on MAR -- reaction unknown   Penicillins Other (See Comments)    Did it involve swelling of the face/tongue/throat, SOB, or low BP? Unknown Did it involve sudden or severe rash/hives, skin peeling, or any reaction on the inside of your mouth or nose? Unknown Did you need to seek medical attention at a hospital or doctor's office? Unknown When did it last happen?       If all above answers are "NO", may proceed with cephalosporin use.   Penicillins     Listed on MAR -- reaction unknown    Allergies as of 04/25/2022       Reactions   Clindamycin/lincomycin Diarrhea   diarrhea   Clindamycin/lincomycin    Listed on MAR -- reaction unknown   Grass Pollen([id number]-[id number]-swt Vern)    Listed on MAR -- reaction unknown   Ibuprofen Other (See Comments)   Long time ago/ list on paper work   Ibuprofen    Listed on MAR -- reaction unknown   Other    Preparation H Listed on MAR -- reaction unknown   Penicillins Other (See Comments)   Did it involve swelling of the face/tongue/throat, SOB, or low BP? Unknown Did it involve sudden or severe rash/hives, skin peeling, or any reaction on the inside of your mouth or nose? Unknown Did you need to seek medical attention at a hospital or doctor's office? Unknown When did it last happen?       If all above answers  are "NO", may proceed with cephalosporin use.   Penicillins    Listed on MAR -- reaction unknown        Medication List        Accurate as of April 25, 2022 10:10 AM. If you have any questions, ask your nurse or doctor.          acetaminophen 325 MG tablet Commonly known as: TYLENOL Take 650 mg by mouth 3 (three) times daily as needed.   albuterol 108 (90 Base) MCG/ACT inhaler Commonly known as: VENTOLIN HFA Inhale 2 puffs into the lungs every 6 (six) hours as needed for wheezing.   amLODipine 5 MG tablet Commonly known as: NORVASC Take 2.5 mg by mouth daily.   apixaban 2.5 MG Tabs tablet Commonly known as:  ELIQUIS Take 1 tablet (2.5 mg total) by mouth 2 (two) times daily.   azelastine 0.1 % nasal spray Commonly known as: ASTELIN Place 1 spray into both nostrils 2 (two) times daily. Use in each nostril as directed   calcium carbonate 500 MG chewable tablet Commonly known as: TUMS - dosed in mg elemental calcium Chew 1 tablet by mouth daily as needed for indigestion.   cetirizine 10 MG tablet Commonly known as: ZYRTEC Take 1 tablet (10 mg total) by mouth daily.   chlorhexidine 0.12 % solution Commonly known as: PERIDEX Use as directed 5 mLs in the mouth or throat every morning. Rinse and spit   clobetasol 0.05 % external solution Commonly known as: TEMOVATE Apply 1 application topically as needed.   diclofenac Sodium 1 % Gel Commonly known as: VOLTAREN Apply 2 g topically in the morning, at noon, in the evening, and at bedtime. MAR states: Dose: "Small amount", Frequency: "As needed", Instructions: "May keep at bedside and self administer"   fluticasone 50 MCG/ACT nasal spray Commonly known as: FLONASE Place 1 spray into both nostrils 2 (two) times daily.   metoprolol succinate 25 MG 24 hr tablet Commonly known as: TOPROL-XL Take 12.5 mg by mouth daily. In the morning. Take 12.5mg  every 8 hours as needed for palpitations.   mineral oil-hydrophilic  petrolatum ointment Apply 1 application topically 2 (two) times daily as needed for dry skin.   mometasone 0.1 % ointment Commonly known as: ELOCON Apply topically at bedtime.   oxymetazoline 0.05 % nasal spray Commonly known as: AFRIN Place 2 sprays into both nostrils 2 (two) times daily as needed for congestion.   polyethylene glycol 17 g packet Commonly known as: MIRALAX / GLYCOLAX Take 17 g by mouth daily as needed.   PreserVision AREDS 2+Multi Vit Caps Take 1 capsule by mouth 2 (two) times daily.   pyridostigmine 60 MG tablet Commonly known as: MESTINON Take 0.5 tablets (30 mg total) by mouth daily.   senna-docusate 8.6-50 MG tablet Commonly known as: Senokot S Take 2 tablets by mouth daily.   sertraline 50 MG tablet Commonly known as: ZOLOFT Take 50 mg by mouth daily.   Systane 0.4-0.3 % Soln Generic drug: Polyethyl Glycol-Propyl Glycol Apply 2 drops each eye twice daily.   Trelegy Ellipta 100-62.5-25 MCG/ACT Aepb Generic drug: Fluticasone-Umeclidin-Vilant Inhale 1 puff into the lungs daily.   Vitamin D 50 MCG (2000 UT) Caps Take 1 capsule by mouth daily.        Review of Systems  Constitutional:  Negative for activity change, appetite change, fatigue and fever.  HENT:  Negative for congestion and sore throat.   Respiratory:  Negative for cough, shortness of breath and wheezing.   Cardiovascular:  Negative for chest pain and leg swelling.  Gastrointestinal:  Negative for abdominal pain, nausea and vomiting.  Genitourinary:  Negative for dysuria and frequency.  Musculoskeletal:  Positive for back pain and gait problem.  Skin:  Negative for wound.  Neurological:  Negative for dizziness, numbness and headaches.  Psychiatric/Behavioral:  Positive for confusion. Negative for agitation and dysphoric mood. The patient is not nervous/anxious.     Immunization History  Administered Date(s) Administered   Fluad Quad(high Dose 65+) 12/16/2021   Influenza Whole  12/12/2011, 12/31/2012   Influenza, High Dose Seasonal PF 12/20/2018, 01/02/2020, 12/15/2020   Influenza,inj,Quad PF,6+ Mos 01/04/2018   Influenza-Unspecified 12/31/2014, 01/06/2016, 01/01/2017   Moderna Covid-19 Vaccine Bivalent Booster 64yrs & up 07/28/2021   Moderna SARS-COV2 Booster Vaccination 01/24/2020, 10/07/2020  Moderna Sars-Covid-2 Vaccination 03/23/2019, 04/22/2019, 12/22/2020, 01/11/2022   Pneumococcal Conjugate-13 07/14/2014   Pneumococcal Polysaccharide-23 03/13/2005   Tdap 04/08/2015   Zoster Recombinat (Shingrix) 03/29/2017, 05/24/2017   Zoster, Live 03/13/2006   Pertinent  Health Maintenance Due  Topic Date Due   INFLUENZA VACCINE  Completed   DEXA SCAN  Completed      01/20/2021    9:37 AM 03/08/2021    4:51 PM 12/27/2021   10:52 AM 01/23/2022    8:41 AM 04/03/2022    2:47 PM  Fall Risk  Falls in the past year? 0 0 1 1 1   Was there an injury with Fall? 0 0 0 0   Fall Risk Category Calculator 0 0 1 1   Fall Risk Category (Retired) Low Low Low Low   (RETIRED) Patient Fall Risk Level Low fall risk Low fall risk Moderate fall risk Moderate fall risk   Patient at Risk for Falls Due to No Fall Risks No Fall Risks History of fall(s) History of fall(s);Impaired balance/gait History of fall(s);Impaired balance/gait;Impaired mobility  Fall risk Follow up Falls evaluation completed Falls evaluation completed Falls evaluation completed Falls evaluation completed Falls evaluation completed   Functional Status Survey:    Vitals:   04/25/22 0950  BP: (!) 180/85  Pulse: (!) 18  Resp: 18  Temp: 97.8 F (36.6 C)  SpO2: 97%  Weight: 137 lb 3.2 oz (62.2 kg)  Height: 5' (1.524 m)   Body mass index is 26.8 kg/m. Physical Exam Vitals reviewed.  Constitutional:      General: She is not in acute distress. HENT:     Head: Normocephalic.  Eyes:     General:        Right eye: No discharge.        Left eye: No discharge.  Cardiovascular:     Rate and Rhythm: Normal  rate. Rhythm irregular.     Pulses: Normal pulses.     Heart sounds: Normal heart sounds.  Pulmonary:     Effort: Pulmonary effort is normal. No respiratory distress.     Breath sounds: Normal breath sounds. No wheezing.  Abdominal:     General: Bowel sounds are normal. There is no distension.     Palpations: Abdomen is soft.     Tenderness: There is no abdominal tenderness.  Musculoskeletal:     Cervical back: Normal and neck supple.     Thoracic back: Tenderness present. No swelling or deformity. Normal range of motion.     Lumbar back: Tenderness present. No swelling or deformity. Normal range of motion.     Right lower leg: No edema.     Left lower leg: No edema.     Comments: Increased tenderness to left middle back, no deformity or bruising   Skin:    General: Skin is warm and dry.     Capillary Refill: Capillary refill takes less than 2 seconds.  Neurological:     General: No focal deficit present.     Mental Status: She is alert. Mental status is at baseline.     Motor: Weakness present.     Gait: Gait abnormal.  Psychiatric:        Mood and Affect: Mood normal.        Behavior: Behavior normal.     Labs reviewed: Recent Labs    07/26/21 0000 08/05/21 0000 12/23/21 0000  NA 147 143 141  K 5.5* 4.9 4.5  CL 106 107 106  CO2 22 29* 25*  BUN 15 17 17   CREATININE 0.9 0.9 0.8  CALCIUM 9.8 9.0 9.6   No results for input(s): "AST", "ALT", "ALKPHOS", "BILITOT", "PROT", "ALBUMIN" in the last 8760 hours. Recent Labs    07/26/21 0000 12/23/21 0000  WBC 5.3 4.7  HGB 12.6 13.1  HCT 37 39  PLT 223 213   Lab Results  Component Value Date   TSH 3.33 12/23/2021   Lab Results  Component Value Date   HGBA1C 5.9 10/02/2017   Lab Results  Component Value Date   CHOL 211 (H) 05/14/2018   HDL 53 05/14/2018   LDLCALC 146 (H) 05/14/2018   TRIG 61 05/14/2018   CHOLHDL 4.0 05/14/2018    Significant Diagnostic Results in last 30 days:  No results  found.  Assessment/Plan 1. Acute left-sided thoracic back pain - another resident fell on her a few days ago> not noted in Matrix - increased left sided pain between thoracic/lumbar spine> tenderness on exam, pain with movement/inspiration - CXR, xray thoracic/lumbar spine to r/o fracture> no acute fracture, mild multilevel degeneration of disc disease present, mild to moderate spondylotic changes present - start lidocaine 4% patch daily x 14 days- apply to left middle back - cont tylenol prn     Family/ staff Communication: plan discussed with patient and nurse  Labs/tests ordered: CXR, xray thoracic/lumbar spine

## 2022-04-26 DIAGNOSIS — M546 Pain in thoracic spine: Secondary | ICD-10-CM | POA: Diagnosis not present

## 2022-04-26 DIAGNOSIS — R079 Chest pain, unspecified: Secondary | ICD-10-CM | POA: Diagnosis not present

## 2022-04-26 DIAGNOSIS — M545 Low back pain, unspecified: Secondary | ICD-10-CM | POA: Diagnosis not present

## 2022-05-04 DIAGNOSIS — I499 Cardiac arrhythmia, unspecified: Secondary | ICD-10-CM | POA: Diagnosis not present

## 2022-05-04 DIAGNOSIS — R404 Transient alteration of awareness: Secondary | ICD-10-CM | POA: Diagnosis not present

## 2022-05-04 DIAGNOSIS — R55 Syncope and collapse: Secondary | ICD-10-CM | POA: Diagnosis not present

## 2022-05-12 DEATH — deceased

## 2022-08-01 ENCOUNTER — Encounter: Payer: Medicare Other | Admitting: Internal Medicine

## 2022-08-15 ENCOUNTER — Ambulatory Visit: Payer: Medicare Other | Admitting: Allergy & Immunology

## 2023-01-29 ENCOUNTER — Encounter: Payer: Medicare Other | Admitting: Adult Health
